# Patient Record
Sex: Male | Born: 1955 | Race: Black or African American | Hispanic: No | Smoking: Current every day smoker
Health system: Southern US, Community
[De-identification: ages and names within clinical notes are randomized; demographics above are authoritative.]

## PROBLEM LIST (undated history)

## (undated) DIAGNOSIS — I10 Essential (primary) hypertension: Secondary | ICD-10-CM

## (undated) DIAGNOSIS — I219 Acute myocardial infarction, unspecified: Secondary | ICD-10-CM

## (undated) DIAGNOSIS — I739 Peripheral vascular disease, unspecified: Secondary | ICD-10-CM

## (undated) DIAGNOSIS — J449 Chronic obstructive pulmonary disease, unspecified: Secondary | ICD-10-CM

## (undated) DIAGNOSIS — K529 Noninfective gastroenteritis and colitis, unspecified: Secondary | ICD-10-CM

## (undated) DIAGNOSIS — I2510 Atherosclerotic heart disease of native coronary artery without angina pectoris: Secondary | ICD-10-CM

## (undated) DIAGNOSIS — E785 Hyperlipidemia, unspecified: Secondary | ICD-10-CM

## (undated) DIAGNOSIS — Z720 Tobacco use: Secondary | ICD-10-CM

## (undated) HISTORY — PX: HERNIA REPAIR: SHX51

## (undated) HISTORY — PX: MOUTH SURGERY: SHX715

## (undated) HISTORY — DX: Atherosclerotic heart disease of native coronary artery without angina pectoris: I25.10

---

## 2001-07-27 ENCOUNTER — Emergency Department (HOSPITAL_COMMUNITY): Admission: EM | Admit: 2001-07-27 | Discharge: 2001-07-27 | Payer: Self-pay | Admitting: *Deleted

## 2004-11-30 ENCOUNTER — Ambulatory Visit: Payer: Self-pay | Admitting: Orthopedic Surgery

## 2007-02-02 ENCOUNTER — Emergency Department (HOSPITAL_COMMUNITY): Admission: EM | Admit: 2007-02-02 | Discharge: 2007-02-02 | Payer: Self-pay | Admitting: Emergency Medicine

## 2009-08-27 ENCOUNTER — Observation Stay (HOSPITAL_COMMUNITY): Admission: EM | Admit: 2009-08-27 | Discharge: 2009-08-29 | Payer: Self-pay | Admitting: Emergency Medicine

## 2009-08-27 ENCOUNTER — Ambulatory Visit: Payer: Self-pay | Admitting: Gastroenterology

## 2009-08-29 ENCOUNTER — Telehealth: Payer: Self-pay | Admitting: Gastroenterology

## 2009-09-06 ENCOUNTER — Emergency Department (HOSPITAL_COMMUNITY): Admission: EM | Admit: 2009-09-06 | Discharge: 2009-09-06 | Payer: Self-pay | Admitting: Emergency Medicine

## 2009-09-29 ENCOUNTER — Encounter: Payer: Self-pay | Admitting: Gastroenterology

## 2009-11-04 ENCOUNTER — Ambulatory Visit: Payer: Self-pay | Admitting: Gastroenterology

## 2009-11-04 DIAGNOSIS — K5289 Other specified noninfective gastroenteritis and colitis: Secondary | ICD-10-CM

## 2009-11-18 ENCOUNTER — Ambulatory Visit: Payer: Self-pay | Admitting: Gastroenterology

## 2009-11-18 ENCOUNTER — Ambulatory Visit (HOSPITAL_COMMUNITY): Admission: RE | Admit: 2009-11-18 | Discharge: 2009-11-18 | Payer: Self-pay | Admitting: Gastroenterology

## 2010-03-14 NOTE — Letter (Signed)
Summary: TCS ORDER  TCS ORDER   Imported By: Ave Filter 11/04/2009 11:19:19  _____________________________________________________________________  External Attachment:    Type:   Image     Comment:   External Document

## 2010-03-14 NOTE — Assessment & Plan Note (Signed)
Summary: colitis/MM   Visit Type:  Follow-up Visit Primary Care Provider:  [facility]  Chief Complaint:  F/U colitis.  History of Present Illness:  Joshua Hall is a very pleasant 55 y/o AA male, who presents to schedule TCS. He was hospitalized in 7/11 with colitis. Treated with Cipro and Flagyl. He has never had TCS. Currently he is back at baseline. One formed BM daily. No melena, brbpr. No heartburn, n/v, dysphagia, weight loss.   Stool culture and C.Diff X 2 were negative. CT A/P 08/27/09-->mild apparent diffuse thickening of wall of colon from cecum to rectum, likely small right renal cysts. CT A/P 09/06/09-->right-sided colonic wall thickening.   Current Medications (verified): 1)  No Meds  Allergies (verified): 1)  ! * Bee Stings  Past History:  Past Medical History: Recent colitis in 7/11, treated as infectious colitis.   Past Surgical History: Unremarkable  Family History: No FH of CRC, colon polyp.  Social History: He has occasional beer and a glass of wine (once every three weeks.).  He smokes 1/2 pack every 3 days, has smoked for the last 30 years.  He raises Higher education careers adviser.  He also works with the [employer]  Review of Systems General:  Denies fever, chills, sweats, anorexia, fatigue, weakness, and weight loss. Eyes:  Denies vision loss. ENT:  Denies nasal congestion, sore throat, hoarseness, and difficulty swallowing. CV:  Denies chest pains, angina, palpitations, dyspnea on exertion, and peripheral edema. Resp:  Denies dyspnea at rest, dyspnea with exercise, cough, sputum, and wheezing. GI:  See HPI. GU:  Denies urinary burning and blood in urine. MS:  Denies joint pain / LOM. Derm:  Denies rash and itching. Neuro:  Denies weakness, frequent headaches, memory loss, and confusion. Psych:  Denies depression and anxiety. Endo:  Denies unusual weight change. Heme:  Denies bruising and bleeding. Allergy:  Denies hives and rash.  Vital  Signs:  Patient profile:   55 year old male Height:      72 inches Weight:      189 pounds BMI:     25.73 Temp:     97.9 degrees F oral Pulse rate:   80 / minute BP sitting:   144 / 82  (left arm)  Vitals Entered By: Cloria Spring LPN (November 04, 2009 10:50 AM)  Physical Exam  General:  Well developed, well nourished, no acute distress. Head:  Normocephalic and atraumatic. Eyes:  Conjunctivae pink, no scleral icterus.  Mouth:  Oropharyngeal mucosa moist, pink.  No lesions, erythema or exudate.    Neck:  Supple; no masses or thyromegaly. Lungs:  Clear throughout to auscultation. Heart:  Regular rate and rhythm; no murmurs, rubs,  or bruits. Abdomen:  Bowel sounds normal.  Abdomen is soft, nontender, nondistended.  No rebound or guarding.  No hepatosplenomegaly, masses or hernias.  No abdominal bruits.  Rectal:  deferred until time of colonoscopy.   Extremities:  No clubbing, cyanosis, edema or deformities noted. Neurologic:  Alert and  oriented x4;  grossly normal neurologically. Skin:  Intact without significant lesions or rashes. Cervical Nodes:  No significant cervical adenopathy. Psych:  Alert and cooperative. Normal mood and affect.  Impression & Recommendations:  Problem # 1:  COLITIS (ICD-558.9)  Recent hospitalization for colitis (likely infectious). Patient seen in ED and had follow-up CT few days after his D/C in 7/11 and colitis had improved. Currently patient is assymptomatic. Needs TCS. Colonoscopy to be performed in near future.  Risks, alternatives, and benefits including but  not limited to the risk of reaction to medication, bleeding, infection, and perforation were addressed.  Patient voiced understanding and provided verbal consent.   Orders: Est. Patient Level IV (21308)

## 2010-03-14 NOTE — Letter (Signed)
Summary: CONSULTATION  CONSULTATION   Imported By: Rexene Alberts 09/29/2009 10:40:27  _____________________________________________________________________  External Attachment:    Type:   Image     Comment:   External Document

## 2010-03-14 NOTE — Progress Notes (Signed)
Summary: COLITIS, NEEDS TCS  TCS IN 4 WEEKS, dX: COLITIS. TRILYTE PREP. [place] Bali MD  August 29, 2009 12:37 PM  Appended Document: COLITIS, NEEDS TCS Patient has appt in office to see extender prior to TCS  Appended Document: COLITIS, NEEDS TCS pt aware of appt for 8/22 @ 2:30pm w/KJ

## 2010-04-29 LAB — RAPID URINE DRUG SCREEN, HOSP PERFORMED
Amphetamines: NOT DETECTED
Amphetamines: NOT DETECTED
Barbiturates: NOT DETECTED
Benzodiazepines: NOT DETECTED
Cocaine: NOT DETECTED
Cocaine: NOT DETECTED
Opiates: POSITIVE — AB
Tetrahydrocannabinol: NOT DETECTED
Tetrahydrocannabinol: NOT DETECTED

## 2010-04-29 LAB — COMPREHENSIVE METABOLIC PANEL
AST: 17 U/L (ref 0–37)
Albumin: 3.1 g/dL — ABNORMAL LOW (ref 3.5–5.2)
Albumin: 3.7 g/dL (ref 3.5–5.2)
Alkaline Phosphatase: 64 U/L (ref 39–117)
Alkaline Phosphatase: 74 U/L (ref 39–117)
BUN: 11 mg/dL (ref 6–23)
BUN: 7 mg/dL (ref 6–23)
CO2: 28 mEq/L (ref 19–32)
Chloride: 106 mEq/L (ref 96–112)
Creatinine, Ser: 0.96 mg/dL (ref 0.4–1.5)
Creatinine, Ser: 0.99 mg/dL (ref 0.4–1.5)
GFR calc Af Amer: >60 mL/min (ref >60)
GFR calc non Af Amer: >60 mL/min (ref >60)
Glucose, Bld: 106 mg/dL — ABNORMAL HIGH (ref 70–99)
Glucose, Bld: 107 mg/dL — ABNORMAL HIGH (ref 70–99)
Sodium: 135 mEq/L (ref 135–145)
Total Bilirubin: 0.7 mg/dL (ref 0.3–1.2)

## 2010-04-29 LAB — BASIC METABOLIC PANEL
BUN: 10 mg/dL (ref 6–23)
BUN: 12 mg/dL (ref 6–23)
Calcium: 8.4 mg/dL (ref 8.4–10.5)
Chloride: 104 mEq/L (ref 96–112)
Creatinine, Ser: 0.86 mg/dL (ref 0.4–1.5)
GFR calc non Af Amer: >60 mL/min (ref >60)
Potassium: 3.8 mEq/L (ref 3.5–5.1)

## 2010-04-29 LAB — HEPATIC FUNCTION PANEL
ALT: 26 U/L (ref 0–53)
AST: 25 U/L (ref 0–37)
Albumin: 3.8 g/dL (ref 3.5–5.2)
Alkaline Phosphatase: 73 U/L (ref 39–117)
Bilirubin, Direct: 0.1 mg/dL (ref 0.0–0.3)
Total Bilirubin: 0.6 mg/dL (ref 0.3–1.2)

## 2010-04-29 LAB — URINALYSIS, ROUTINE W REFLEX MICROSCOPIC
Bilirubin Urine: NEGATIVE
Glucose, UA: NEGATIVE mg/dL
Ketones, ur: NEGATIVE mg/dL
Ketones, ur: NEGATIVE mg/dL
Leukocytes, UA: NEGATIVE
Nitrite: NEGATIVE
Nitrite: NEGATIVE
Protein, ur: NEGATIVE mg/dL
Protein, ur: NEGATIVE mg/dL
Specific Gravity, Urine: 1.025 (ref 1.005–1.030)
Urobilinogen, UA: 0.2 mg/dL (ref 0.0–1.0)
pH: 6 (ref 5.0–8.0)
pH: 6 (ref 5.0–8.0)

## 2010-04-29 LAB — MAGNESIUM: Magnesium: 1.7 mg/dL (ref 1.5–2.5)

## 2010-04-29 LAB — DIFFERENTIAL
Basophils Absolute: 0 10*3/uL (ref 0.0–0.1)
Basophils Absolute: 0 10*3/uL (ref 0.0–0.1)
Basophils Relative: 1 % (ref 0–1)
Eosinophils Absolute: 0.2 10*3/uL (ref 0.0–0.7)
Eosinophils Relative: 3 % (ref 0–5)
Eosinophils Relative: 6 % — ABNORMAL HIGH (ref 0–5)
Lymphocytes Relative: 22 % (ref 12–46)
Lymphocytes Relative: 41 % (ref 12–46)
Lymphs Abs: 1.1 10*3/uL (ref 0.7–4.0)
Monocytes Absolute: 0.4 10*3/uL (ref 0.1–1.0)
Monocytes Relative: 12 % (ref 3–12)
Monocytes Relative: 9 % (ref 3–12)
Neutro Abs: 2.2 10*3/uL (ref 1.7–7.7)
Neutro Abs: 3 10*3/uL (ref 1.7–7.7)
Neutrophils Relative %: 62 % (ref 43–77)

## 2010-04-29 LAB — CBC
HCT: 37.4 % — ABNORMAL LOW (ref 39.0–52.0)
Hemoglobin: 11.7 g/dL — ABNORMAL LOW (ref 13.0–17.0)
Hemoglobin: 13.1 g/dL (ref 13.0–17.0)
MCH: 31.8 pg (ref 26.0–34.0)
MCH: 31.9 pg (ref 26.0–34.0)
MCHC: 35.1 g/dL (ref 30.0–36.0)
MCV: 90.4 fL (ref 78.0–100.0)
MCV: 90.9 fL (ref 78.0–100.0)
MCV: 91.5 fL (ref 78.0–100.0)
Platelets: 267 10*3/uL (ref 150–400)
RBC: 3.67 MIL/uL — ABNORMAL LOW (ref 4.22–5.81)
RDW: 14 % (ref 11.5–15.5)
WBC: 4.8 10*3/uL (ref 4.0–10.5)
WBC: 5.1 10*3/uL (ref 4.0–10.5)
WBC: 5.2 10*3/uL (ref 4.0–10.5)

## 2010-04-29 LAB — LIPID PANEL
HDL: 42 mg/dL (ref >39)
VLDL: 16 mg/dL (ref 0–40)

## 2010-04-29 LAB — BRAIN NATRIURETIC PEPTIDE: Pro B Natriuretic peptide (BNP): <30 pg/mL (ref 0.0–100.0)

## 2010-04-29 LAB — CLOSTRIDIUM DIFFICILE EIA

## 2010-04-29 LAB — HEMOGLOBIN A1C
Hgb A1c MFr Bld: 5.7 % — ABNORMAL HIGH (ref <5.7)
Mean Plasma Glucose: 117 mg/dL — ABNORMAL HIGH (ref <117)

## 2010-04-29 LAB — LIPASE, BLOOD: Lipase: 24 U/L (ref 11–59)

## 2010-06-07 ENCOUNTER — Emergency Department (HOSPITAL_COMMUNITY)
Admission: EM | Admit: 2010-06-07 | Discharge: 2010-06-08 | Disposition: A | Discharge status: Home / Self Care | Payer: PRIVATE HEALTH INSURANCE | Attending: Emergency Medicine | Admitting: Emergency Medicine

## 2010-06-07 ENCOUNTER — Emergency Department (HOSPITAL_COMMUNITY): Payer: PRIVATE HEALTH INSURANCE

## 2010-06-07 DIAGNOSIS — R109 Unspecified abdominal pain: Secondary | ICD-10-CM | POA: Insufficient documentation

## 2010-06-07 DIAGNOSIS — R112 Nausea with vomiting, unspecified: Secondary | ICD-10-CM | POA: Insufficient documentation

## 2010-06-07 DIAGNOSIS — R197 Diarrhea, unspecified: Secondary | ICD-10-CM | POA: Insufficient documentation

## 2010-06-07 LAB — CBC
Hemoglobin: 13.8 g/dL (ref 13.0–17.0)
MCH: 31.2 pg (ref 26.0–34.0)
MCHC: 35.8 g/dL (ref 30.0–36.0)
MCV: 86.9 fL (ref 78.0–100.0)
Platelets: 272 10*3/uL (ref 150–400)

## 2010-06-07 LAB — DIFFERENTIAL
Basophils Relative: 0 % (ref 0–1)
Eosinophils Absolute: 0.1 10*3/uL (ref 0.0–0.7)
Lymphs Abs: 0.7 10*3/uL (ref 0.7–4.0)
Monocytes Absolute: 0.5 10*3/uL (ref 0.1–1.0)
Monocytes Relative: 8 % (ref 3–12)

## 2010-06-08 LAB — BASIC METABOLIC PANEL
BUN: 13 mg/dL (ref 6–23)
CO2: 25 mEq/L (ref 19–32)
Calcium: 9.7 mg/dL (ref 8.4–10.5)
Creatinine, Ser: 1.07 mg/dL (ref 0.4–1.5)
GFR calc Af Amer: >60 mL/min (ref >60)

## 2010-07-26 ENCOUNTER — Emergency Department (HOSPITAL_COMMUNITY)
Admission: EM | Admit: 2010-07-26 | Discharge: 2010-07-26 | Disposition: A | Discharge status: Home / Self Care | Payer: PRIVATE HEALTH INSURANCE | Attending: Emergency Medicine | Admitting: Emergency Medicine

## 2010-07-26 DIAGNOSIS — L02619 Cutaneous abscess of unspecified foot: Secondary | ICD-10-CM | POA: Insufficient documentation

## 2010-07-26 DIAGNOSIS — F17200 Nicotine dependence, unspecified, uncomplicated: Secondary | ICD-10-CM | POA: Insufficient documentation

## 2010-07-26 DIAGNOSIS — L03119 Cellulitis of unspecified part of limb: Secondary | ICD-10-CM | POA: Insufficient documentation

## 2010-07-26 DIAGNOSIS — M7989 Other specified soft tissue disorders: Secondary | ICD-10-CM | POA: Insufficient documentation

## 2010-07-27 ENCOUNTER — Other Ambulatory Visit (HOSPITAL_COMMUNITY): Payer: Self-pay | Admitting: Internal Medicine

## 2010-07-27 ENCOUNTER — Ambulatory Visit (HOSPITAL_COMMUNITY)
Admission: RE | Admit: 2010-07-27 | Discharge: 2010-07-27 | Disposition: A | Discharge status: Home / Self Care | Payer: PRIVATE HEALTH INSURANCE | Source: Ambulatory Visit | Attending: Internal Medicine | Admitting: Internal Medicine

## 2010-07-27 DIAGNOSIS — M25579 Pain in unspecified ankle and joints of unspecified foot: Secondary | ICD-10-CM

## 2011-10-14 HISTORY — PX: CARDIAC CATHETERIZATION: SHX172

## 2011-10-18 ENCOUNTER — Encounter (HOSPITAL_COMMUNITY): Payer: Self-pay

## 2011-10-18 ENCOUNTER — Inpatient Hospital Stay (HOSPITAL_COMMUNITY)
Admission: EM | Admit: 2011-10-18 | Discharge: 2011-10-20 | DRG: 287 | Disposition: A | Discharge status: Home / Self Care | Payer: PRIVATE HEALTH INSURANCE | Attending: Cardiology | Admitting: Cardiology

## 2011-10-18 ENCOUNTER — Emergency Department (HOSPITAL_COMMUNITY): Payer: PRIVATE HEALTH INSURANCE

## 2011-10-18 DIAGNOSIS — E785 Hyperlipidemia, unspecified: Secondary | ICD-10-CM | POA: Diagnosis present

## 2011-10-18 DIAGNOSIS — F17200 Nicotine dependence, unspecified, uncomplicated: Secondary | ICD-10-CM | POA: Diagnosis present

## 2011-10-18 DIAGNOSIS — R079 Chest pain, unspecified: Secondary | ICD-10-CM

## 2011-10-18 DIAGNOSIS — I2510 Atherosclerotic heart disease of native coronary artery without angina pectoris: Principal | ICD-10-CM | POA: Diagnosis present

## 2011-10-18 DIAGNOSIS — I498 Other specified cardiac arrhythmias: Secondary | ICD-10-CM | POA: Diagnosis present

## 2011-10-18 DIAGNOSIS — I200 Unstable angina: Secondary | ICD-10-CM | POA: Diagnosis present

## 2011-10-18 DIAGNOSIS — E782 Mixed hyperlipidemia: Secondary | ICD-10-CM | POA: Diagnosis present

## 2011-10-18 DIAGNOSIS — Z8249 Family history of ischemic heart disease and other diseases of the circulatory system: Secondary | ICD-10-CM

## 2011-10-18 HISTORY — DX: Noninfective gastroenteritis and colitis, unspecified: K52.9

## 2011-10-18 LAB — CBC WITH DIFFERENTIAL/PLATELET
Basophils Absolute: 0 10*3/uL (ref 0.0–0.1)
Basophils Relative: 0 % (ref 0–1)
Eosinophils Absolute: 0.4 10*3/uL (ref 0.0–0.7)
Eosinophils Relative: 5 % (ref 0–5)
Lymphs Abs: 3 10*3/uL (ref 0.7–4.0)
MCH: 31 pg (ref 26.0–34.0)
MCV: 85.4 fL (ref 78.0–100.0)
Neutrophils Relative %: 42 % — ABNORMAL LOW (ref 43–77)
Platelets: 259 10*3/uL (ref 150–400)
RBC: 4.58 MIL/uL (ref 4.22–5.81)
RDW: 13.9 % (ref 11.5–15.5)

## 2011-10-18 LAB — HEPARIN LEVEL (UNFRACTIONATED)
Heparin Unfractionated: 0.25 IU/mL — ABNORMAL LOW (ref 0.30–0.70)
Heparin Unfractionated: 0.32 IU/mL (ref 0.30–0.70)

## 2011-10-18 LAB — TROPONIN I
Troponin I: <0.3 ng/mL (ref <0.30)
Troponin I: <0.3 ng/mL (ref <0.30)

## 2011-10-18 LAB — TSH: TSH: 0.698 u[IU]/mL (ref 0.350–4.500)

## 2011-10-18 LAB — BASIC METABOLIC PANEL
Calcium: 9.4 mg/dL (ref 8.4–10.5)
GFR calc Af Amer: 79 mL/min — ABNORMAL LOW (ref >90)
GFR calc non Af Amer: 68 mL/min — ABNORMAL LOW (ref >90)
Glucose, Bld: 116 mg/dL — ABNORMAL HIGH (ref 70–99)
Potassium: 3.5 mEq/L (ref 3.5–5.1)
Sodium: 138 mEq/L (ref 135–145)

## 2011-10-18 LAB — CK TOTAL AND CKMB (NOT AT ARMC)
CK, MB: 2.5 ng/mL (ref 0.3–4.0)
Total CK: 171 U/L (ref 7–232)

## 2011-10-18 MED ORDER — ZOLPIDEM TARTRATE 5 MG PO TABS: 5.0000 mg | ORAL_TABLET | Freq: Every evening | ORAL | Status: DC | PRN

## 2011-10-18 MED ORDER — ONDANSETRON HCL 4 MG/2ML IJ SOLN: 4.0000 mg | Freq: Four times a day (QID) | INTRAMUSCULAR | Status: DC | PRN

## 2011-10-18 MED ORDER — SODIUM CHLORIDE 0.9 % IV SOLN
1.0000 mL/kg/h | INTRAVENOUS | Status: DC
  Administered 2011-10-19: 1 mL/kg/h via INTRAVENOUS

## 2011-10-18 MED ORDER — DIAZEPAM 5 MG PO TABS
5.0000 mg | ORAL_TABLET | ORAL | Status: AC
  Administered 2011-10-19: 5 mg via ORAL
  Filled 2011-10-18: qty 1

## 2011-10-18 MED ORDER — ASPIRIN EC 81 MG PO TBEC
81.0000 mg | DELAYED_RELEASE_TABLET | Freq: Every day | ORAL | Status: DC
  Administered 2011-10-19 – 2011-10-20 (×2): 81 mg via ORAL
  Filled 2011-10-18 (×2): qty 1

## 2011-10-18 MED ORDER — ASPIRIN 81 MG PO CHEW
324.0000 mg | CHEWABLE_TABLET | Freq: Once | ORAL | Status: AC
  Administered 2011-10-18: 324 mg via ORAL

## 2011-10-18 MED ORDER — ATORVASTATIN CALCIUM 80 MG PO TABS
80.0000 mg | ORAL_TABLET | Freq: Every day | ORAL | Status: DC
  Administered 2011-10-18 – 2011-10-19 (×2): 80 mg via ORAL
  Filled 2011-10-18 (×4): qty 1

## 2011-10-18 MED ORDER — ASPIRIN 81 MG PO CHEW
CHEWABLE_TABLET | ORAL | Status: AC
  Filled 2011-10-18: qty 4

## 2011-10-18 MED ORDER — HEPARIN (PORCINE) IN NACL 100-0.45 UNIT/ML-% IJ SOLN
1400.0000 [IU]/h | INTRAMUSCULAR | Status: DC
  Administered 2011-10-18: 1400 [IU]/h via INTRAVENOUS
  Filled 2011-10-18 (×4): qty 250

## 2011-10-18 MED ORDER — ACETAMINOPHEN 325 MG PO TABS
650.0000 mg | ORAL_TABLET | ORAL | Status: DC | PRN
  Administered 2011-10-18 – 2011-10-19 (×2): 650 mg via ORAL
  Filled 2011-10-18 (×2): qty 2

## 2011-10-18 MED ORDER — ASPIRIN 81 MG PO CHEW
324.0000 mg | CHEWABLE_TABLET | ORAL | Status: AC
  Administered 2011-10-19: 324 mg via ORAL
  Filled 2011-10-18: qty 4

## 2011-10-18 MED ORDER — SODIUM CHLORIDE 0.9 % IJ SOLN
3.0000 mL | Freq: Two times a day (BID) | INTRAMUSCULAR | Status: DC
  Administered 2011-10-18: 3 mL via INTRAVENOUS

## 2011-10-18 MED ORDER — NITROGLYCERIN 0.2 MG/HR TD PT24
0.2000 mg | MEDICATED_PATCH | Freq: Every day | TRANSDERMAL | Status: DC
  Administered 2011-10-18 – 2011-10-20 (×3): 0.2 mg via TRANSDERMAL
  Filled 2011-10-18 (×4): qty 1

## 2011-10-18 MED ORDER — SODIUM CHLORIDE 0.9 % IV SOLN
INTRAVENOUS | Status: AC
  Administered 2011-10-18: 08:00:00 via INTRAVENOUS

## 2011-10-18 MED ORDER — HEPARIN BOLUS VIA INFUSION
4000.0000 [IU] | Freq: Once | INTRAVENOUS | Status: AC
  Administered 2011-10-18: 4000 [IU] via INTRAVENOUS

## 2011-10-18 MED ORDER — NITROGLYCERIN 0.4 MG SL SUBL
0.4000 mg | SUBLINGUAL_TABLET | SUBLINGUAL | Status: DC | PRN
  Administered 2011-10-18 (×2): 0.4 mg via SUBLINGUAL
  Filled 2011-10-18: qty 25

## 2011-10-18 MED ORDER — SODIUM CHLORIDE 0.9 % IJ SOLN
3.0000 mL | INTRAMUSCULAR | Status: DC | PRN
  Administered 2011-10-19: 3 mL via INTRAVENOUS

## 2011-10-18 MED ORDER — HEPARIN (PORCINE) IN NACL 100-0.45 UNIT/ML-% IJ SOLN
1200.0000 [IU]/h | Freq: Once | INTRAMUSCULAR | Status: AC
  Administered 2011-10-18: 1200 [IU]/h via INTRAVENOUS
  Filled 2011-10-18: qty 250

## 2011-10-18 MED ORDER — METOPROLOL TARTRATE 25 MG PO TABS
25.0000 mg | ORAL_TABLET | Freq: Two times a day (BID) | ORAL | Status: DC
  Administered 2011-10-18 – 2011-10-20 (×4): 25 mg via ORAL
  Filled 2011-10-18 (×6): qty 1

## 2011-10-18 MED ORDER — SODIUM CHLORIDE 0.9 % IV SOLN: 250.0000 mL | INTRAVENOUS | Status: DC | PRN

## 2011-10-18 NOTE — ED Notes (Signed)
Pt left with [facility] 

## 2011-10-18 NOTE — ED Provider Notes (Addendum)
History     CSN: [id number]  Arrival date & time 10/18/11  0507   First MD Initiated Contact with Patient 10/18/11 [id number]      Chief Complaint  Patient presents with  . Chest Pain    (Consider location/radiation/quality/duration/timing/severity/associated sxs/prior treatment) Patient is a 56 y.o. male presenting with chest pain. The history is provided by the patient and the spouse.  Chest Pain The chest pain began less than 1 hour ago. Chest pain occurs constantly. The chest pain is unchanged. At its most intense, the pain is at 10/10. The quality of the pain is described as pressure-like and tightness. The pain radiates to the left arm and left shoulder. Pertinent negatives for primary symptoms include no fever, no syncope, no shortness of breath, no palpitations, no abdominal pain, no nausea, no vomiting and no dizziness.  Pertinent negatives for associated symptoms include no diaphoresis. He tried nothing for the symptoms. Risk factors include male gender and smoking/tobacco exposure.  His family medical history is significant for CAD in family.     History reviewed. No pertinent past medical history.  History reviewed. No pertinent past surgical history.  No family history on file.  History  Substance Use Topics  . Smoking status: Current Everyday Smoker  . Smokeless tobacco: Not on file  . Alcohol Use: No      Review of Systems  Constitutional: Negative for fever and diaphoresis.  HENT: Negative for neck pain.   Eyes: Negative for visual disturbance.  Respiratory: Positive for chest tightness. Negative for shortness of breath.   Cardiovascular: Positive for chest pain. Negative for palpitations and syncope.  Gastrointestinal: Negative for nausea, vomiting and abdominal pain.  Genitourinary: Negative for dysuria and hematuria.  Musculoskeletal: Negative for back pain.  Skin: Negative for rash.  Neurological: Negative for dizziness and headaches.  Hematological: Does  not bruise/bleed easily.    Allergies  Review of patient's allergies indicates no known allergies.  Home Medications  No current outpatient prescriptions on file.  BP 113/80  Pulse 70  Temp 97.6 F (36.4 C) (Oral)  Resp 20  SpO2 95%  Physical Exam  Nursing note and vitals reviewed. Constitutional: He is oriented to person, place, and time. He appears well-developed and well-nourished. No distress.  HENT:  Head: Normocephalic and atraumatic.  Mouth/Throat: Oropharynx is clear and moist.  Eyes: Conjunctivae are normal. Pupils are equal, round, and reactive to light.  Neck: Normal range of motion. Neck supple.  Cardiovascular: Normal rate, regular rhythm, normal heart sounds and intact distal pulses.   No murmur heard. Pulmonary/Chest: Effort normal and breath sounds normal. No respiratory distress. He has no wheezes. He has no rales. He exhibits no tenderness.  Abdominal: Soft. There is no tenderness.  Musculoskeletal: Normal range of motion. He exhibits no edema.  Neurological: He is alert and oriented to person, place, and time. No cranial nerve deficit. He exhibits normal muscle tone. Coordination normal.  Skin: Skin is warm. No rash noted.    ED Course  Procedures (including critical care time)  Labs Reviewed  CBC WITH DIFFERENTIAL - Abnormal; Notable for the following:    MCHC 36.3 (*)     Neutrophils Relative 42 (*)     All other components within normal limits  BASIC METABOLIC PANEL - Abnormal; Notable for the following:    Glucose, Bld 116 (*)     GFR calc non Af Amer 68 (*)     GFR calc Af Amer 79 (*)  All other components within normal limits  TROPONIN I  TROPONIN I   Dg Chest Portable 1 View  10/18/2011  *RADIOLOGY REPORT*  Clinical Data: Chest pain  PORTABLE CHEST - 1 VIEW  Comparison: Portable exam 0550 hours without priors for comparison.  Findings: Minimal enlargement of cardiac silhouette. Tortuous aorta. Pulmonary vascularity normal. No definite  infiltrate, pleural effusion or pneumothorax. No acute osseous findings.  IMPRESSION: No acute abnormalities.   Original Report Authenticated By: Lollie Marrow, [name]    Results for orders placed during the hospital encounter of 10/18/11  CBC WITH DIFFERENTIAL      Component Value Range   WBC 7.1  4.0 - 10.5 K/uL   RBC 4.58  4.22 - 5.81 MIL/uL   Hemoglobin 14.2  13.0 - 17.0 g/dL   HCT 40.9  81.1 - 91.4 %   MCV 85.4  78.0 - 100.0 fL   MCH 31.0  26.0 - 34.0 pg   MCHC 36.3 (*) 30.0 - 36.0 g/dL   RDW 78.2  95.6 - 21.3 %   Platelets 259  150 - 400 K/uL   Neutrophils Relative 42 (*) 43 - 77 %   Neutro Abs 3.0  1.7 - 7.7 K/uL   Lymphocytes Relative 42  12 - 46 %   Lymphs Abs 3.0  0.7 - 4.0 K/uL   Monocytes Relative 11  3 - 12 %   Monocytes Absolute 0.8  0.1 - 1.0 K/uL   Eosinophils Relative 5  0 - 5 %   Eosinophils Absolute 0.4  0.0 - 0.7 K/uL   Basophils Relative 0  0 - 1 %   Basophils Absolute 0.0  0.0 - 0.1 K/uL  BASIC METABOLIC PANEL      Component Value Range   Sodium 138  135 - 145 mEq/L   Potassium 3.5  3.5 - 5.1 mEq/L   Chloride 102  96 - 112 mEq/L   CO2 27  19 - 32 mEq/L   Glucose, Bld 116 (*) 70 - 99 mg/dL   BUN 14  6 - 23 mg/dL   Creatinine, Ser 0.86  0.50 - 1.35 mg/dL   Calcium 9.4  8.4 - 57.8 mg/dL   GFR calc non Af Amer 68 (*) >90 mL/min   GFR calc Af Amer 79 (*) >90 mL/min  TROPONIN I      Component Value Range   Troponin I <0.30  <0.30 ng/mL    Date: 10/18/2011  Rate: 65  Rhythm: normal sinus rhythm  QRS Axis: normal  Intervals: normal  ST/T Wave abnormalities: nonspecific ST/T changes  Conduction Disutrbances:none  Narrative Interpretation:   Old EKG Reviewed: none available    1. Chest pain     CRITICAL CARE Performed by: Shelda Jakes.   Total critical care time: 30 Critical care time was exclusive of separately billable procedures and treating other patients.  Critical care was necessary to treat or prevent imminent or life-threatening  deterioration.  Critical care was time spent personally by me on the following activities: development of treatment plan with patient and/or surrogate as well as nursing, discussions with consultants, evaluation of patient's response to treatment, examination of patient, obtaining history from patient or surrogate, ordering and performing treatments and interventions, ordering and review of laboratory studies, ordering and review of radiographic studies, pulse oximetry and re-evaluation of patient's condition.   MDM  Patient with acute onset of chest pain at 445 this morning. Pain resolved shortly after arrival with the aspirin 324 mg and  2 sublingual nitroglycerin. Patient has remained pain-free since then. Patient chest x-ray was negative troponin cardiac marker was negative electrolytes without significant abnormalities. Patient has risk factors for his age being a male and a smoker also has a strong family history a brother with a significant heart event in his late 75s. Initially discussed with the hospitalist team they're willing to admit that the change her mind I contacted cardiology unassigned at [place] [facility] and Dr. Leona Singleton is accepted the patient to telemetry, heparin started. Patient is still pain-free and will be transported via [facility].         Shelda Jakes, MD 10/18/11 1610  Shelda Jakes, MD 10/20/11 1054

## 2011-10-18 NOTE — Progress Notes (Signed)
ANTICOAGULATION CONSULT NOTE - Follow Up Consult  Pharmacy Consult for heparin Indication: chest pain/ACS  No Known Allergies  Patient Measurements: Height: 6' 0.5" (184.2 cm) Weight: 201 lb (91.173 kg) IBW/kg (Calculated) : 78.75  Heparin Dosing Weight: 91kg  Vital Signs: Temp: 98 F (36.7 C) (09/05 2100) Temp src: Oral (09/05 1108) BP: 130/80 mmHg (09/05 2100) Pulse Rate: 51  (09/05 2100)  Labs:  Basename 10/18/11 2127 10/18/11 2033 10/18/11 1529 10/18/11 1402 10/18/11 0731 10/18/11 0527  HGB -- -- -- -- -- 14.2  HCT -- -- -- -- -- 39.1  PLT -- -- -- -- -- 259  APTT -- -- -- -- -- --  LABPROT -- -- -- -- -- --  INR -- -- -- -- -- --  HEPARINUNFRC 0.32 -- 0.25* -- -- --  CREATININE -- -- -- -- -- 1.18  CKTOTAL -- 164 -- 171 -- --  CKMB -- 2.5 -- 2.5 -- --  TROPONINI -- <0.30 -- <0.30 <0.30 --    Estimated Creatinine Clearance: 79.8 ml/min (by C-G formula based on Cr of 1.18).   Medications:  See med rec  Assessment: Patient is a 56 y.o M transferred to [facility] from [facility] for further workup of CP. Heparin 4000 units IV bolus and drip at 1200 units/hr started at [facility] at 8 AM today. Heparin level (0.32) is at-goal on 1400 units/hr.   Goal of Therapy:  Heparin level 0.3-0.7 units/ml Monitor platelets by anticoagulation protocol: Yes   Plan:  1. Continue IV heparin at 1400 units/hr. 2. Follow-up AM heparin level, CBC.  Emeline Gins 10/18/2011,10:36 PM

## 2011-10-18 NOTE — ED Notes (Signed)
Pt waiting to be transferred.

## 2011-10-18 NOTE — ED Notes (Signed)
[facility] here to transport pt 

## 2011-10-18 NOTE — ED Notes (Signed)
Breakfast tray given to pt 

## 2011-10-18 NOTE — ED Notes (Signed)
Dr. Deretha Emory in and discussed transfer to [facility] with pt.

## 2011-10-18 NOTE — Consult Note (Signed)
[facility] Medical Consultation  Joshua Hall OZH:[id number] DOB: November 09, 1955 DOA: 10/18/2011 PCP: Dwana Melena, MD   Requesting physician: Dr. Dutch Quint. Date of consultation: 10/18/2011. Reason for consultation: Chest tightness.  Impression/Recommendations    1. Unstable angina. 2. Tobacco abuse. 3. Strong family history of early coronary artery disease. Patient's brother died at the age of 95 with a myocardial infarction. I spoke with the ER physician, Dr. Jodelle Gross and have strongly recommended that this patient be transferred to [facility] for urgent consultation with cardiology as I think he should have cardiac catheterization for his unstable angina. He should be on intravenous heparin and nitrates.   Chief Complaint: Chest tightness.  HPI:  This 56 year old man, smoker, strong family history of early coronary artery disease, presents for the above symptom. He woke up at 4.45 this morning with onset of chest tightness radiating to the left arm. Approximately by 5 AM he was in the emergency room and 2 nitroglycerin relieved his discomfort in pain. He has never had this chest tightness before. He has no history of coronary artery disease. Initial ECG is unremarkable as is troponin.  Review of Systems:  Apart from history of present illness, other systems negative.    Social History: He is single, has a girlfriend. He lives with his mother. He is a smoker as mentioned above. He does drink alcohol but only occasionally. He works for the city of [employer].  No Known Allergies Family history: His brother died at the age of 34 with a myocardial infarction.  Prior to Admission medications   Not on File   Physical Exam: Blood pressure 113/80, pulse 70, temperature 97.6 F (36.4 C), temperature source Oral, resp. rate 20, SpO2 95.00%. Filed Vitals:   10/18/11 0546 10/18/11 0600 10/18/11 0627 10/18/11 0700  BP: 129/85 133/86 127/87 113/80  Pulse:  60 70     Temp:      TempSrc:      Resp:  17 20 20   SpO2:  100% 95%      General:  He looks systemically well. He does not appear to be any acute pain at the present time.  Eyes: No jaundice. No pallor.  ENT: Within normal limits.  Neck: No lymphadenopathy.  Cardiovascular: Heart sounds are present without any murmurs. There is no gallop rhythm. Jugular venous pressure not raised.  Respiratory: Lung fields are clear.  Abdomen: Soft, nontender, no masses. There is no hepatosplenomegaly.  Skin: No rashes.  Musculoskeletal: No joint problems.  Psychiatric: Appropriate affect.  Neurologic: Alert and orientated without any focal neurological signs.  Labs on Admission:  Basic Metabolic Panel:  Lab 10/18/11 6295  NA 138  K 3.5  CL 102  CO2 27  GLUCOSE 116*  BUN 14  CREATININE 1.18  CALCIUM 9.4  MG --  PHOS --       CBC:  Lab 10/18/11 0527  WBC 7.1  NEUTROABS 3.0  HGB 14.2  HCT 39.1  MCV 85.4  PLT 259   Cardiac Enzymes:  Lab 10/18/11 0527  CKTOTAL --  CKMB --  CKMBINDEX --  TROPONINI <0.30      Radiological Exams on Admission: Dg Chest Portable 1 View  10/18/2011  *RADIOLOGY REPORT*  Clinical Data: Chest pain  PORTABLE CHEST - 1 VIEW  Comparison: Portable exam 0550 hours without priors for comparison.  Findings: Minimal enlargement of cardiac silhouette. Tortuous aorta. Pulmonary vascularity normal. No definite infiltrate, pleural effusion or pneumothorax. No acute osseous findings.  IMPRESSION: No acute abnormalities.  Original Report Authenticated By: Lollie Marrow, [name]     EKG: Independently reviewed. Normal sinus rhythm no obvious acute ST-T wave changes.  Time spent: 30 minutes.  Wilson Singer [facility] Pager [phone].  If 7PM-7AM, please contact night-coverage [email or web address] Password [email or web address] 10/18/2011, 7:31 AM

## 2011-10-18 NOTE — ED Notes (Signed)
Pt states pain is now doen to 2/10 and that the numbness and tingling to left arm has mostly resolved.

## 2011-10-18 NOTE — ED Notes (Signed)
[facility] given to [facility]

## 2011-10-18 NOTE — H&P (Signed)
History and Physical  Patient ID: Joshua Hall MRN: [id number], SOB: 08-14-1955 56 y.o. Date of Encounter: 10/18/2011, 1:34 PM  Primary Physician: Dwana Melena, MD Primary Cardiologist: None  Chief Complaint: Chest pain  HPI: 56 y.o. male w/ PMHx significant for tobacco abuse and no prior cardiac history who transferred from [facility] to [facility] on 10/18/2011 with complaints of chest pain.  Patient has no prior cardiac history. Receives regular medical care. Denies HTN, HLD, or DM. Family history significant for a brother who died of MI at age 4. 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 He is normally able to do farm work, including a lot of walking up hills without chest pain or sob. One day last week he noticed he was more sob when walking up his hill back to the house and had to rest. This morning he woke up around 3:30 to go to the bathroom. He smoked a cigarette and while walking back to bed had sudden onset substernal chest pressure with radiation to his left arm. He thought it was indigestion so he drank pepsi with baking soda, which normally relieves his indigestion, but his pain continued and developed numbness in his left arm prompting him to present to the [facility] ED. The pain lasted about 30 minutes and was relieved with 2SL NTG in the ED. He had mild associated sob and diaphoresis. No nausea. Not changed with movement or inspiration. He traveled 6hrs to and from [place] about 3wks ago, but stopped often. No recent surgery. No history of cancer or bleeding/clotting disorders. Denies calf pain or swelling, but did endorse bilat calf spasms/cramps on Saturday while in bed. Denies recent illness, fever, chills, cough, night sweats, weight change, orthopnea, edema, palpitations, syncope, abd pain, change in bladder or bowels, melena/hematochezia/hematuria.   EKG shows NSR with no acute ST/T changes. CXR is  without acute cardiopulmonary abnormalities. Labs are significant for normal troponin x2 and unremarkable CBC/BMET. The chest pain was relieved with ASA and NTG in the ED. His symptoms were concerning for unstable angina for which he was placed on a heparin drip, NTG patch, BB and transferred to [facility] for further evaluation and treatment. He has arrived to [facility] in stable condition and pain free. Not tachycardic, tachypneic or hypoxic.   Past Medical History  Diagnosis Date  . Tobacco abuse   . Colitis     Surgical History:  Past Surgical History  Procedure Date  . None     Home Meds: None  Allergies: No Known Allergies  History   Social History  . Marital Status: Divorced    Spouse Name: N/A    Number of Children: N/A  . Years of Education: N/A   Occupational History  . Not on file.   Social History Main Topics  . Smoking status: Current Everyday Smoker -- 0.7 packs/day  . Smokeless tobacco: Not on file  . Alcohol Use: Yes     occasional  . Drug Use: No  . Sexually Active: Not on file   Other Topics Concern  . Not on file   Social History Narrative  . No narrative on file     Family History  Problem Relation Age of Onset  . Heart disease Brother     died of MI at 43  . Diabetes Mother     Review of Systems: General: negative for chills, fever,  night sweats or weight changes.  Cardiovascular: As per HPI Dermatological: negative for rash Respiratory: negative for cough or wheezing Urologic: negative for hematuria Abdominal: negative for nausea, vomiting, diarrhea, bright red blood per rectum, melena, or hematemesis Neurologic: negative for visual changes, syncope, or dizziness All other systems reviewed and are otherwise negative except as noted above.  Labs:   Component Value Date   WBC 7.1 10/18/2011   HGB 14.2 10/18/2011   HCT 39.1 10/18/2011   MCV 85.4 10/18/2011   PLT 259 10/18/2011    Lab 10/18/11 0527  NA 138  K 3.5  CL  102  CO2 27  BUN 14  CREATININE 1.18  CALCIUM 9.4  GLUCOSE 116*   Basename 10/18/11 0731 10/18/11 0527  TROPONINI <0.30 <0.30    Radiology/Studies:   10/18/2011 - Chest Portable 1 View Findings: Minimal enlargement of cardiac silhouette. Tortuous aorta. Pulmonary vascularity normal. No definite infiltrate, pleural effusion or pneumothorax. No acute osseous findings.  IMPRESSION: No acute abnormalities.       EKG: 10/18/11 @ 0507 - NSR 65bpm, no acute ST/T changes  Physical Exam: Blood pressure 130/88, pulse 54, temperature 98 F (36.7 C), temperature source Oral, resp. rate 18, height 6' 0.5" (1.842 m), weight 201 lb (91.173 kg), SpO2 98.00%. General: Well developed, well nourished, middle-aged in no acute distress. Head: Normocephalic, atraumatic, sclera non-icteric, nares are without discharge Neck: Supple. Negative for carotid bruits. JVD not elevated. Lungs: Clear bilaterally to auscultation without wheezes, rales, or rhonchi. Breathing is unlabored. Heart: RRR with S1 S2. No murmurs, rubs, or gallops appreciated. Abdomen: Soft, non-tender, non-distended with normoactive bowel sounds. No rebound/guarding. No obvious abdominal masses. Msk:  Strength and tone appear normal for age. Extremities: No edema. No clubbing or cyanosis. Distal pedal pulses are 2+ and equal bilaterally. Neuro: Alert and oriented X 3. Moves all extremities spontaneously. Psych:  Responds to questions appropriately with a normal affect.    ASSESSMENT AND PLAN:  56 y.o. male w/ PMHx significant for tobacco abuse and no prior cardiac history who transferred from [facility] to [facility] on 10/18/2011 with complaints of chest pain.  1. Chest pain: Patient has no prior cardiac history, but has risk factors of male gender, tobacco abuse, and family h/o early CAD. Presents with one episode of typical chest pain that occurred at ~4am this morning and was relieved after ~40mins with 2 SL NTG in the  ED. Troponin normal x2. EKG without acute ischemic changes. Not tachycardic, tachypneic or hypoxic. Low suspicion for PE. Will need further ischemic evaluation with cardiac cath. Cont IV heparin. Cont BB and monitor heart rate. Place on ASA and statin. Check lipid panel and A1C for risk stratification. Cont to cycle enzymes.  2. Tobacco Abuse: Discussed importance of smoking cessation. Expresses desire to quit.  Signed, HOPE, JESSICA PA-C 10/18/2011, 1:34 PM  History and all data above reviewed.  Patient examined.  I agree with the findings as above.  The patient has new onset resting chest pain that is worrisome for unstable angina in a patient with a family history of early CAD and tobacco abuse. Currently pain free.  Other findings as above. The patient exam reveals COR:RRR  ,  Lungs: Clear  ,  Abd: Positive bowel sounds, no rebound no guarding, Ext No edema  .  All available labs, radiology testing, previous records reviewed. Agree with documented assessment and plan. Very high pretest probability of obstructive CAD.  He needs a cardiac cath.  The  patient understands that risks included but are not limited to stroke (1 in 1000), death (1 in 1000), kidney failure [usually temporary] (1 in 500), bleeding (1 in 200), allergic reaction [possibly serious] (1 in 200).  The patient understands and agrees to proceed.   He will remain on meds as above pending this procedure tomorrow. Sooner if he has further symptoms.   Fayrene Fearing Arnell Mausolf  1:48 PM  10/18/2011

## 2011-10-18 NOTE — ED Notes (Signed)
Pt states pain down to 4/10 at this time after first nitro tab, States he has a slight headache and does not want to take a second tablet at this time.

## 2011-10-18 NOTE — Progress Notes (Addendum)
ANTICOAGULATION CONSULT NOTE - Follow Up Consult  Pharmacy Consult for heparin Indication: chest pain/ACS  No Known Allergies  Patient Measurements: Height: 6' 0.5" (184.2 cm) Weight: 201 lb (91.173 kg) IBW/kg (Calculated) : 78.75  Heparin Dosing Weight: 91kg  Vital Signs: Temp: 98 F (36.7 C) (09/05 1108) Temp src: Oral (09/05 1108) BP: 130/88 mmHg (09/05 1108) Pulse Rate: 54  (09/05 1108)  Labs:  Basename 10/18/11 0731 10/18/11 0527  HGB -- 14.2  HCT -- 39.1  PLT -- 259  APTT -- --  LABPROT -- --  INR -- --  HEPARINUNFRC -- --  CREATININE -- 1.18  CKTOTAL -- --  CKMB -- --  TROPONINI <0.30 <0.30    Estimated Creatinine Clearance: 79.8 ml/min (by C-G formula based on Cr of 1.18).   Medications:  See med rec  Assessment: Patient is a 56 y.o M transferred to [facility] from [facility] for further workup of CP. Heparin 4000 units IV bolus and drip at 1200 units/hr started at [facility] at 8 AM today.   Goal of Therapy:  Heparin level 0.3-0.7 units/ml Monitor platelets by anticoagulation protocol: Yes   Plan:  1) check heparin level at 3 PM today 2) continue current heparin regimen for now   Nuchem Grattan P 10/18/2011,2:21 PM  Adden (4PM):  Heparin level now back slightly below goal at 0.25.  Will increase heparin rate up to 1400 units/hr and recheck another 6 hour level.

## 2011-10-18 NOTE — ED Notes (Signed)
Pt awoke with left chest pressure/tightness, with radiation and numbness to left arm.  Pt denies other sympotms

## 2011-10-18 NOTE — ED Notes (Signed)
Pt states relief of chest pain after second nitro.

## 2011-10-19 ENCOUNTER — Ambulatory Visit (HOSPITAL_COMMUNITY): Admit: 2011-10-19 | Payer: Self-pay | Admitting: Cardiology

## 2011-10-19 ENCOUNTER — Encounter (HOSPITAL_COMMUNITY)
Admission: EM | Disposition: A | Discharge status: Home / Self Care | Payer: Self-pay | Source: Home / Self Care | Attending: Cardiology

## 2011-10-19 ENCOUNTER — Encounter (HOSPITAL_COMMUNITY): Payer: Self-pay | Admitting: General Practice

## 2011-10-19 DIAGNOSIS — I2510 Atherosclerotic heart disease of native coronary artery without angina pectoris: Secondary | ICD-10-CM

## 2011-10-19 HISTORY — PX: LEFT HEART CATHETERIZATION WITH CORONARY ANGIOGRAM: SHX5451

## 2011-10-19 LAB — COMPREHENSIVE METABOLIC PANEL
ALT: 16 U/L (ref 0–53)
Alkaline Phosphatase: 79 U/L (ref 39–117)
CO2: 25 mEq/L (ref 19–32)
GFR calc Af Amer: 89 mL/min — ABNORMAL LOW (ref >90)
GFR calc non Af Amer: 77 mL/min — ABNORMAL LOW (ref >90)
Glucose, Bld: 113 mg/dL — ABNORMAL HIGH (ref 70–99)
Potassium: 3.9 mEq/L (ref 3.5–5.1)
Sodium: 138 mEq/L (ref 135–145)

## 2011-10-19 LAB — CBC
Hemoglobin: 13.1 g/dL (ref 13.0–17.0)
RBC: 4.27 MIL/uL (ref 4.22–5.81)
WBC: 5.6 10*3/uL (ref 4.0–10.5)

## 2011-10-19 LAB — HEMOGLOBIN A1C: Hgb A1c MFr Bld: 5.8 % — ABNORMAL HIGH (ref <5.7)

## 2011-10-19 LAB — LIPID PANEL
HDL: 48 mg/dL (ref >39)
LDL Cholesterol: 118 mg/dL — ABNORMAL HIGH (ref 0–99)

## 2011-10-19 SURGERY — LEFT HEART CATHETERIZATION WITH CORONARY ANGIOGRAM
Anesthesia: LOCAL

## 2011-10-19 MED ORDER — NITROGLYCERIN 0.2 MG/ML ON CALL CATH LAB
INTRAVENOUS | Status: AC
  Filled 2011-10-19: qty 1

## 2011-10-19 MED ORDER — HEPARIN (PORCINE) IN NACL 2-0.9 UNIT/ML-% IJ SOLN
INTRAMUSCULAR | Status: AC
  Filled 2011-10-19: qty 2000

## 2011-10-19 MED ORDER — SODIUM CHLORIDE 0.9 % IV SOLN
1.0000 mL/kg/h | INTRAVENOUS | Status: AC
  Administered 2011-10-19: 1 mL/kg/h via INTRAVENOUS

## 2011-10-19 MED ORDER — ALUM & MAG HYDROXIDE-SIMETH 200-200-20 MG/5ML PO SUSP
30.0000 mL | ORAL | Status: DC | PRN
  Administered 2011-10-20: 30 mL via ORAL
  Filled 2011-10-19: qty 30

## 2011-10-19 MED ORDER — FENTANYL CITRATE 0.05 MG/ML IJ SOLN
INTRAMUSCULAR | Status: AC
  Filled 2011-10-19: qty 2

## 2011-10-19 MED ORDER — VERAPAMIL HCL 2.5 MG/ML IV SOLN
INTRAVENOUS | Status: AC
  Filled 2011-10-19: qty 2

## 2011-10-19 MED ORDER — MIDAZOLAM HCL 2 MG/2ML IJ SOLN
INTRAMUSCULAR | Status: AC
  Filled 2011-10-19: qty 2

## 2011-10-19 MED ORDER — LIDOCAINE HCL (PF) 1 % IJ SOLN
INTRAMUSCULAR | Status: AC
  Filled 2011-10-19: qty 30

## 2011-10-19 MED ORDER — HEPARIN SODIUM (PORCINE) 1000 UNIT/ML IJ SOLN
INTRAMUSCULAR | Status: AC
  Filled 2011-10-19: qty 1

## 2011-10-19 NOTE — Progress Notes (Signed)
Pt called w/ c/o "indigestion", points to epigastric area but states "left arm went numb".  Pt just finished snack and laid back flatter from reclined position.  BP 147/85.  EKG done and compared to old images.  SB 50's.  ST wave sloped in most leads but no different than previous EKGs.  Pain and numbness went away in <10 minutes without intervention.  Will continue to monitor closely, advised pt to call if pain or numbness returns.

## 2011-10-19 NOTE — Progress Notes (Signed)
UR Completed Sahory Nordling Graves-Bigelow, RN,BSN [phone]  

## 2011-10-19 NOTE — Interval H&P Note (Signed)
History and Physical Interval Note:  10/19/2011 3:13 PM  Joshua Hall  has presented today for surgery, with the diagnosis of Chest pain  The various methods of treatment have been discussed with the patient and family. After consideration of risks, benefits and other options for treatment, the patient has consented to  Procedure(s) (LRB) with comments: LEFT HEART CATHETERIZATION WITH CORONARY ANGIOGRAM (N/A) as a surgical intervention .  The patient's history has been reviewed, patient examined, no change in status, stable for surgery.  I have reviewed the patient's chart and labs.  Questions were answered to the patient's satisfaction.     Theron Arista [facility] 10/19/2011 3:13 PM

## 2011-10-19 NOTE — CV Procedure (Signed)
   Cardiac Catheterization Procedure Note  Name: Joshua Hall MRN: [id number] DOB: 06/13/1955  Procedure: Left Heart Cath, Selective Coronary Angiography, LV angiography  Indication: 56 year old black male presents with symptoms of unstable angina. He has a history of tobacco abuse.   Procedural Details: The right wrist was prepped, draped, and anesthetized with 1% lidocaine. Using the modified Seldinger technique, a 5 French sheath was introduced into the right radial artery. 3 mg of verapamil was administered through the sheath, weight-based unfractionated heparin was administered intravenously. Standard Judkins catheters were used for selective coronary angiography and left ventriculography. Catheter exchanges were performed over an exchange length guidewire. There were no immediate procedural complications. A TR band was used for radial hemostasis at the completion of the procedure.  The patient was transferred to the post catheterization recovery area for further monitoring.  Procedural Findings: Hemodynamics: AO 105/61 with a mean of 80 mmHg LV 106/14 mmHg  Coronary angiography: Coronary dominance: right  Left mainstem: Normal.  Left anterior descending (LAD): There is 30% stenosis in the proximal and mid LAD. The second diagonal is a very small branch. It is only 1 mm in diameter. It has a 99% stenosis proximally.  There is a large ramus intermediate branch which is normal.  Left circumflex (LCx): Normal.  Right coronary artery (RCA): Mild nonobstructive disease in the mid and distal vessel up to 10-20%.  Left ventriculography: Left ventricular systolic function is normal, LVEF is estimated at 55-65%, there is no significant mitral regurgitation   Final Conclusions:   1. Single vessel obstructive coronary disease involving a very tiny diagonal branch. Otherwise nonobstructive disease. The diagonal branch is too small for intervention.  2. Normal left ventricular  function.  Recommendations: Aggressive medical therapy and risk factor modification. Anticipate discharge in the morning if no further chest pain.  Theron Arista [facility] 10/19/2011, 3:45 PM

## 2011-10-19 NOTE — Care Management Note (Unsigned)
    Page 1 of 1   10/19/2011     2:34:50 PM   CARE MANAGEMENT NOTE 10/19/2011  Patient:  Joshua Hall, Joshua Hall   Account Number:  [id number]  Date Initiated:  10/19/2011  Documentation initiated by:  GRAVES-BIGELOW,Dreshawn Hendershott  Subjective/Objective Assessment:   Pt admitted with cp. Plan for cath.     Action/Plan:   CM will conitnue to monitor for disposition needs.   Anticipated DC Date:  10/19/2011   Anticipated DC Plan:  HOME/SELF CARE      DC Planning Services  CM consult      Choice offered to / List presented to:             Status of service:  In process, will continue to follow Medicare Important Message given?   (If response is "NO", the following Medicare IM given date fields will be blank) Date Medicare IM given:   Date Additional Medicare IM given:    Discharge Disposition:    Per UR Regulation:  Reviewed for med. necessity/level of care/duration of stay  If discussed at Long Length of Stay Meetings, dates discussed:    Comments:

## 2011-10-19 NOTE — Plan of Care (Signed)
Problem: Consults Goal: Tobacco Cessation referral if indicated Outcome: Completed/Met Date Met:  10/19/11 Discussed smoking cessation in length with pt.  States he smokes 3/4 ppd, last smoked 9/4, voices ready to quit, never has tried to quit before.  Discussed nicotine patch, gum, and medication, advised him to ask MD in am for recommendation.  Reminded pt danger of  smoking while using patch or gum.  Cessation handouts given with support line number.  Encouragement provided.

## 2011-10-19 NOTE — Progress Notes (Signed)
TR BAND REMOVAL  LOCATION:    right radial  DEFLATED PER PROTOCOL:    yes  TIME BAND OFF / DRESSING APPLIED:    1830   SITE UPON ARRIVAL:    Level 0  SITE AFTER BAND REMOVAL:    Level 0  REVERSE ALLEN'S TEST:     positive  CIRCULATION SENSATION AND MOVEMENT:    Within Normal Limits   yes  COMMENTS:   Tolerated procedure well 

## 2011-10-19 NOTE — Progress Notes (Signed)
ANTICOAGULATION CONSULT NOTE - Follow Up Consult  Pharmacy Consult for heparin Indication: chest pain/ACS  No Known Allergies  Patient Measurements: Height: 6' 0.5" (184.2 cm) Weight: 201 lb (91.173 kg) IBW/kg (Calculated) : 78.75    Vital Signs: Temp: 97.9 F (36.6 C) (09/06 0500) BP: 107/63 mmHg (09/06 0500) Pulse Rate: 55  (09/06 0500)  Labs:  Basename 10/19/11 0600 10/18/11 2127 10/18/11 2033 10/18/11 1529 10/18/11 1402 10/18/11 0731 10/18/11 0527  HGB 13.1 -- -- -- -- -- 14.2  HCT 36.3* -- -- -- -- -- 39.1  PLT 252 -- -- -- -- -- 259  APTT -- -- -- -- -- -- --  LABPROT 13.1 -- -- -- -- -- --  INR 0.97 -- -- -- -- -- --  HEPARINUNFRC 0.49 0.32 -- 0.25* -- -- --  CREATININE 1.07 -- -- -- -- -- 1.18  CKTOTAL -- -- 164 -- 171 -- --  CKMB -- -- 2.5 -- 2.5 -- --  TROPONINI -- -- <0.30 -- <0.30 <0.30 --    Estimated Creatinine Clearance: 88 ml/min (by C-G formula based on Cr of 1.07).  Assessment: Patient is a 56 y.o M on heparin for CP with plan for possible cardiac cath today.  Heparin level is at goal.  No bleeding noted.  Goal of Therapy:  Heparin level 0.3-0.7 units/ml Monitor platelets by anticoagulation protocol: Yes   Plan:  1) no change for heparin rate 2) f/u after cath    Mortimer Bair P 10/19/2011,10:38 AM

## 2011-10-20 DIAGNOSIS — E782 Mixed hyperlipidemia: Secondary | ICD-10-CM | POA: Diagnosis present

## 2011-10-20 LAB — CBC
Hemoglobin: 13.5 g/dL (ref 13.0–17.0)
RBC: 4.37 MIL/uL (ref 4.22–5.81)

## 2011-10-20 MED ORDER — SIMVASTATIN 40 MG PO TABS: 40.0000 mg | ORAL_TABLET | Freq: Every day | ORAL | Status: DC

## 2011-10-20 MED ORDER — NITROGLYCERIN 0.4 MG SL SUBL: 0.4000 mg | SUBLINGUAL_TABLET | SUBLINGUAL | Status: DC | PRN

## 2011-10-20 MED ORDER — METOPROLOL TARTRATE 25 MG PO TABS: 25.0000 mg | ORAL_TABLET | Freq: Two times a day (BID) | ORAL | Status: DC

## 2011-10-20 MED ORDER — NITROGLYCERIN 0.2 MG/HR TD PT24: 1.0000 | MEDICATED_PATCH | Freq: Every day | TRANSDERMAL | Status: DC

## 2011-10-20 MED ORDER — ASPIRIN 81 MG PO TABS: 81.0000 mg | ORAL_TABLET | Freq: Every day | ORAL | Status: AC

## 2011-10-20 NOTE — Discharge Summary (Signed)
CARDIOLOGY DISCHARGE SUMMARY   Patient ID: Joshua Hall MRN: [id number] DOB/AGE: 03/04/1955 56 y.o.  Admit date: 10/18/2011 Discharge date: 10/20/2011  Primary Discharge Diagnosis:  Anginal chest pain - medical therapy Secondary Discharge Diagnosis:  Past Medical History  Diagnosis Date  . Tobacco abuse     dyslipidemia, LDL goal less than 70   . Colitis    Procedures: Left Heart Cath, Selective Coronary Angiography, LV angiography  Hospital Course: Joshua Hall is a 56 year old male with no previous history of coronary artery disease. He had chest pain he came to the hospital where he was admitted for further evaluation and treatment.  His cardiac enzymes were negative for MI. His chest x-ray showed no acute disease. His symptoms were concerning for unstable angina. He was taken to the cath lab on 10/19/2011.  Full results are below but medical therapy was recommended for a small diagonal that was 99% block. He tolerated the procedure well. Smoking cessation was counseled and encouraged. Lipid profile showed an elevated LDL so he was started on a statin. He had a beta blocker added to his medication regimen as well as a nitroglycerin patch.  On 10/20/2011, Joshua Hall was seen by Dr. Ladona Ridgel. He was ambulating without chest pain or shortness of breath and considered stable for discharge, to follow up as an outpatient.  Labs:   Lab Results  Component Value Date   WBC 4.9 10/20/2011   HGB 13.5 10/20/2011   HCT 37.4* 10/20/2011   MCV 85.6 10/20/2011   PLT 257 10/20/2011    Lab 10/19/11 0600  NA 138  K 3.9  CL 104  CO2 25  BUN 12  CREATININE 1.07  CALCIUM 9.1  PROT 6.6  BILITOT 0.2*  ALKPHOS 79  ALT 16  AST 16  GLUCOSE 113*    Basename 10/18/11 2033 10/18/11 1402 10/18/11 0731  CKTOTAL 164 171 --  CKMB 2.5 2.5 --  CKMBINDEX -- -- --  TROPONINI <0.30 <0.30 <0.30   Lipid Panel     Component Value Date/Time   CHOL 191 10/19/2011 0600   TRIG 123 10/19/2011 0600   HDL 48  10/19/2011 0600   CHOLHDL 4.0 10/19/2011 0600   VLDL 25 10/19/2011 0600   LDLCALC 118* 10/19/2011 0600    Pro B Natriuretic peptide (BNP)  Date/Time Value Range Status  08/27/2009  7:22 AM <30.0  0.0 - 100.0 pg/mL Final    Basename 10/19/11 0600  INR 0.97      Radiology: Dg Chest Portable 1 View 10/18/2011  *RADIOLOGY REPORT*  Clinical Data: Chest pain  PORTABLE CHEST - 1 VIEW  Comparison: Portable exam 0550 hours without priors for comparison.  Findings: Minimal enlargement of cardiac silhouette. Tortuous aorta. Pulmonary vascularity normal. No definite infiltrate, pleural effusion or pneumothorax. No acute osseous findings.  IMPRESSION: No acute abnormalities.   Original Report Authenticated By: Lollie Marrow, [name]     Cardiac Cath: 10/19/2011 Left mainstem: Normal.  Left anterior descending (LAD): There is 30% stenosis in the proximal and mid LAD. The second diagonal is a very small branch. It is only 1 mm in diameter. It has a 99% stenosis proximally.  There is a large ramus intermediate branch which is normal.  Left circumflex (LCx): Normal.  Right coronary artery (RCA): Mild nonobstructive disease in the mid and distal vessel up to 10-20%.  Left ventriculography: Left ventricular systolic function is normal, LVEF is estimated at 55-65%, there is no significant mitral regurgitation  Final Conclusions:  1.  Single vessel obstructive coronary disease involving a very tiny diagonal branch. Otherwise nonobstructive disease. The diagonal branch is too small for intervention.  2. Normal left ventricular function.  Recommendations: Aggressive medical therapy and risk factor modification.  EKG: *20-Oct-2011 05:15:28  Sinus bradycardia Early repolarization Otherwise normal ECG 24mm/s 80mm/mV 100Hz  8.0.1 12SL 241 HD CID: 1 Referred by: JAMES HOCHREIN Unconfirmed Vent. rate 51 BPM PR interval 206 ms QRS duration 76 ms QT/QTc 430/396 ms P-R-T axes 60 68 71  FOLLOW UP PLANS AND  APPOINTMENTS No Known Allergies Medication List  As of 10/20/2011 10:19 AM   TAKE these medications         aspirin 81 MG tablet   Take 1 tablet (81 mg total) by mouth daily.      metoprolol tartrate 25 MG tablet   Commonly known as: LOPRESSOR   Take 1 tablet (25 mg total) by mouth 2 (two) times daily.      nitroGLYCERIN 0.2 mg/hr   Commonly known as: NITRODUR - Dosed in mg/24 hr   Place 1 patch (0.2 mg total) onto the skin daily.      nitroGLYCERIN 0.4 MG SL tablet   Commonly known as: NITROSTAT   Place 1 tablet (0.4 mg total) under the tongue every 5 (five) minutes as needed for chest pain.      simvastatin 40 MG tablet   Commonly known as: ZOCOR   Take 1 tablet (40 mg total) by mouth at bedtime.            BRING ALL MEDICATIONS WITH YOU TO FOLLOW UP APPOINTMENTS  Time spent with patient to include physician time: 31 min Signed: Theodore Demark 10/20/2011, 10:19 AM Co-Sign MD

## 2011-10-20 NOTE — Progress Notes (Signed)
Patient ID: Joshua Hall, male   DOB: 12-24-1955, 56 y.o.   MRN: [id number] Subjective:  No chest pain  Objective:  Vital Signs in the last 24 hours: Temp:  [97.7 F (36.5 C)-98.4 F (36.9 C)] 98.3 F (36.8 C) (09/07 0736) Pulse Rate:  [48-69] 58  (09/07 0736) Resp:  [12-24] 22  (09/07 0736) BP: (108-143)/(69-88) 137/69 mmHg (09/07 0736) SpO2:  [94 %-99 %] 96 % (09/07 0736) Weight:  [208 lb 15.9 oz (94.8 kg)] 208 lb 15.9 oz (94.8 kg) (09/07 0000)  Intake/Output from previous day: 09/06 0701 - 09/07 0700 In: 1170 [P.O.:600; I.V.:570] Out: 2025 [Urine:2025] Intake/Output from this shift:    Physical Exam: Well appearing NAD HEENT: Unremarkable Neck:  No JVD, no thyromegally Lungs:  Clear with no wheezes HEART:  Regular rate rhythm, no murmurs, no rubs, no clicks Abd:  Flat, positive bowel sounds, no organomegally, no rebound, no guarding Ext:  2 plus pulses, no edema, no cyanosis, no clubbing, right wrist without hematoma. Skin:  No rashes no nodules Neuro:  CN II through XII intact, motor grossly intact  Lab Results:  Basename 10/20/11 0626 10/19/11 0600  WBC 4.9 5.6  HGB 13.5 13.1  PLT 257 252    Basename 10/19/11 0600 10/18/11 0527  NA 138 138  K 3.9 3.5  CL 104 102  CO2 25 27  GLUCOSE 113* 116*  BUN 12 14  CREATININE 1.07 1.18    Basename 10/18/11 2033 10/18/11 1402  TROPONINI <0.30 <0.30   Hepatic Function Panel  Basename 10/19/11 0600  PROT 6.6  ALBUMIN 3.3*  AST 16  ALT 16  ALKPHOS 79  BILITOT 0.2*  BILIDIR --  IBILI --    Basename 10/19/11 0600  CHOL 191   No results found for this basename: PROTIME in the last 72 hours  Imaging: No results found.  Cardiac Studies: Tele - NSR Assessment/Plan:  1. CAD, s/p cath with small diagonal stenosis, medical therapy.  2. Dyslipidemia 3. Tobacco abuse Rec: ok for discharge. I discussed risk factor modification including stopping smoking. Usual followup.  LOS: 2 days    Buel Ream.D. 10/20/2011, 9:08 AM

## 2011-10-25 ENCOUNTER — Encounter: Payer: Self-pay | Admitting: Adult Health

## 2011-10-26 ENCOUNTER — Encounter: Payer: Self-pay | Admitting: Adult Health

## 2011-10-26 ENCOUNTER — Ambulatory Visit (INDEPENDENT_AMBULATORY_CARE_PROVIDER_SITE_OTHER): Payer: PRIVATE HEALTH INSURANCE | Admitting: Adult Health

## 2011-10-26 ENCOUNTER — Encounter: Payer: Self-pay | Admitting: *Deleted

## 2011-10-26 VITALS — BP 98/66 | HR 59 | Ht 72.0 in | Wt 199.0 lb

## 2011-10-26 DIAGNOSIS — I7090 Unspecified atherosclerosis: Secondary | ICD-10-CM

## 2011-10-26 DIAGNOSIS — E785 Hyperlipidemia, unspecified: Secondary | ICD-10-CM

## 2011-10-26 DIAGNOSIS — I2510 Atherosclerotic heart disease of native coronary artery without angina pectoris: Secondary | ICD-10-CM | POA: Insufficient documentation

## 2011-10-26 NOTE — Assessment & Plan Note (Signed)
Most recent labs completed during admission revealed a total cholesterol of 191, HDL of 48, triglycerides of 123, with an LDL of 118. The patient is currently on simvastatin 40 mg by mouth at bedtime. He will need followup lipids and LFTs in 3 months for ongoing assessment and risk reduction. He has been advised on low-cholesterol diet.

## 2011-10-26 NOTE — Progress Notes (Signed)
HPI: Joshua Hall is a 56 year old patient we are seeing on hospital followup after being seen by Dr. Oaks Lions in the setting of unstable angina, status post cardiac catheterization. The patient was found to have disease of his right coronary artery, LAD, and first diagonal, cath report dated 10/19/2011 revealed a 30% stenosis in the proximal and mid LAD, the second diagonal was a very small branch with only 1 mm in diameter. It had a 99% stenosis proximally. The circumflex was normal, the right coronary artery had mild nonobstructive disease in the mid and distal vessel up to 10-20%. His LV function was normal with an LVEF of 55-65%. There was no significant mitral regurgitation. The patient is to be treated medically and was placed on a nitroglycerin transdermal patch at 0.2 mg daily. He is also been placed on metoprolol 25 mg twice a day along with aspirin and Zocor.    The patient has no recurrent complaints of discomfort in his chest, dizziness, lightheadedness, or near syncope.[name] He remains busy and active but has not returned to work. 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 He is anxious to return to work.   No Known Allergies  Current Outpatient Prescriptions  Medication Sig Dispense Refill  . aspirin 81 MG tablet Take 1 tablet (81 mg total) by mouth daily.  30 tablet    . metoprolol tartrate (LOPRESSOR) 25 MG tablet Take 1 tablet (25 mg total) by mouth 2 (two) times daily.  60 tablet  11  . nitroGLYCERIN (NITRODUR - DOSED IN MG/24 HR) 0.2 mg/hr Place 1 patch (0.2 mg total) onto the skin daily.  30 patch  11  . nitroGLYCERIN (NITROSTAT) 0.4 MG SL tablet Place 1 tablet (0.4 mg total) under the tongue every 5 (five) minutes as needed for chest pain.  25 tablet  3  . simvastatin (ZOCOR) 40 MG tablet Take 1 tablet (40 mg total) by mouth at bedtime.  30 tablet  11    Past Medical History  Diagnosis Date  . Tobacco abuse   . Colitis     Past Surgical History  Procedure Date  .  None   . Mouth surgery     Family History  Problem Relation Age of Onset  . Heart disease Brother     died of MI at 26  . Diabetes Mother     History   Social History  . Marital Status: Divorced    Spouse Name: N/A    Number of Children: N/A  . Years of Education: N/A   Occupational History  . Not on file.   Social History Main Topics  . Smoking status: Former Smoker -- 0.7 packs/day for 40 years    Types: Cigarettes    Quit date: 10/17/2011  . Smokeless tobacco: Never Used  . Alcohol Use: Yes     occasional  . Drug Use: No  . Sexually Active: Yes   Other Topics Concern  . Not on file   Social History Narrative  . No narrative on file    VHQ:IONGEX of systems complete and found to be negative unless listed above  PHYSICAL EXAM BP 98/66  Pulse 59  Ht 6' (1.829 m)  Wt 199 lb (90.266 kg)  BMI 26.99 kg/m2  General: Well developed, well nourished, in no acute distress Head: Eyes PERRLA, No xanthomas.   Normal cephalic and atramatic  Lungs: Clear bilaterally to auscultation and percussion. Heart: HRRR S1 S2, without MRG.  Pulses are 2+ &  equal.            No carotid bruit. No JVD.  No abdominal bruits. No femoral bruits. Abdomen: Bowel sounds are positive, abdomen soft and non-tender without masses or   Hernia's noted. Msk:  Back normal, normal gait. Normal strength and tone for age. Right risk catheter insertion site is well-healed without evidence of bleeding or hematoma.  Extremities: No clubbing, cyanosis or edema.  DP +1 Neuro: Alert and oriented X 3. Psych:  Good affect, responds appropriately  EKG: Sinus bradycardia rate of 59 beats per minute, early repolarization is noted.   ASSESSMENT AND PLAN

## 2011-10-26 NOTE — Assessment & Plan Note (Signed)
The patient is without complaint of recurrent chest pain. He is tolerating nitroglycerin transdermal 0.2 mg daily without complaints of dizziness lightheadedness or headache. His blood pressure is soft and 98/66. I have retaken in the office manually, with a blood pressure of 110/78. Can consider decreasing transdermal nitroglycerin 0.1 mg daily if his blood pressure remains low and he is symptomatic with this. Currently this is not the case. He will followup in one month with Dr. Diona Browner. At that time discussion can be done concerning need to adjust medications at his discretion. The patient will be allowed to return to work with restrictions of heavy lifting, nothing greater than 20 pounds. 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

## 2011-10-26 NOTE — Patient Instructions (Signed)
Your physician recommends that you schedule a follow-up appointment in: 1 month with Dr McDowell.  Your physician recommends that you continue on your current medications as directed. Please refer to the Current Medication list given to you today.    

## 2011-11-28 ENCOUNTER — Encounter: Payer: Self-pay | Admitting: Cardiology

## 2011-11-28 ENCOUNTER — Ambulatory Visit (INDEPENDENT_AMBULATORY_CARE_PROVIDER_SITE_OTHER): Payer: PRIVATE HEALTH INSURANCE | Admitting: Cardiology

## 2011-11-28 VITALS — BP 140/70 | HR 71 | Ht 72.0 in | Wt 211.0 lb

## 2011-11-28 DIAGNOSIS — E782 Mixed hyperlipidemia: Secondary | ICD-10-CM

## 2011-11-28 DIAGNOSIS — I2510 Atherosclerotic heart disease of native coronary artery without angina pectoris: Secondary | ICD-10-CM

## 2011-11-28 NOTE — Assessment & Plan Note (Signed)
Continue statin therapy. We will likely arrange followup lab work around the time of his next visit.

## 2011-11-28 NOTE — Patient Instructions (Addendum)
Your physician recommends that you schedule a follow-up appointment in 3 MONTHS. 

## 2011-11-28 NOTE — Progress Notes (Signed)
   Clinical Summary [name] is a 56 y.o.male presenting for office followup. He was seen recently by Ms. Lawrence NP following hospitalization at [facility], and diagnosis of CAD with plan for medical therapy. This is my first meeting with him.  He states that he has been doing fairly well, now back at work full time. He reports occasional angina, has used sublingual nitroglycerin only once. Discussed his blood pressure, he states he does check it periodically at home.  Lab work in September revealed LDL 118, HDL 48.  No Known Allergies  Current Outpatient Prescriptions  Medication Sig Dispense Refill  . aspirin 81 MG tablet Take 1 tablet (81 mg total) by mouth daily.  30 tablet    . metoprolol tartrate (LOPRESSOR) 25 MG tablet Take 1 tablet (25 mg total) by mouth 2 (two) times daily.  60 tablet  11  . nitroGLYCERIN (NITRODUR - DOSED IN MG/24 HR) 0.2 mg/hr Place 1 patch (0.2 mg total) onto the skin daily.  30 patch  11  . nitroGLYCERIN (NITROSTAT) 0.4 MG SL tablet Place 1 tablet (0.4 mg total) under the tongue every 5 (five) minutes as needed for chest pain.  25 tablet  3  . simvastatin (ZOCOR) 40 MG tablet Take 1 tablet (40 mg total) by mouth at bedtime.  30 tablet  11    Past Medical History  Diagnosis Date  . Tobacco abuse   . Colitis   . Coronary atherosclerosis of native coronary artery     Largely nonobstructive in major epicardial was with 99% small diagonal stenosis - medical therapy    Social History [name] reports that he quit smoking about 6 weeks ago. His smoking use included Cigarettes. He has a 28 pack-year smoking history. He has never used smokeless tobacco. [name] reports that he drinks alcohol.  Review of Systems No palpitations, bleeding episodes, syncope. Stable appetite. Otherwise negative.  Physical Examination Filed Vitals:   11/28/11 1321  BP: 140/70  Pulse: 71   Filed Weights   11/28/11 1321  Weight: 211 lb (95.709 kg)   Patient no  acute distress. HEENT: Conjunctiva and lids normal, oropharynx clear with moist mucosa. Neck: Supple, no elevated JVP or carotid bruits, no thyromegaly. Lungs: Clear to auscultation, nonlabored breathing at rest. Cardiac: Regular rate and rhythm, no S3, 2/6 systolic murmur, no pericardial rub. Abdomen: Soft, nontender, bowel sounds present, no guarding or rebound. Extremities: No pitting edema, distal pulses 2+.   Problem List and Plan   Coronary atherosclerosis of native coronary artery Symptomatically stable on current medical regimen. I have asked him to check blood pressure more regularly, and if this remains elevated, he might benefit from an additional agent such as ACE inhibitor or Norvasc, also to serve as better antianginal control. Followup arranged.  Mixed hyperlipidemia Continue statin therapy. We will likely arrange followup lab work around the time of his next visit.    Jonelle Sidle, [name], F.A.C.C.

## 2011-11-28 NOTE — Assessment & Plan Note (Signed)
Symptomatically stable on current medical regimen. I have asked him to check blood pressure more regularly, and if this remains elevated, he might benefit from an additional agent such as ACE inhibitor or Norvasc, also to serve as better antianginal control. Followup arranged.

## 2012-01-20 ENCOUNTER — Emergency Department (HOSPITAL_COMMUNITY): Payer: PRIVATE HEALTH INSURANCE

## 2012-01-20 ENCOUNTER — Encounter (HOSPITAL_COMMUNITY): Payer: Self-pay | Admitting: *Deleted

## 2012-01-20 ENCOUNTER — Inpatient Hospital Stay (HOSPITAL_COMMUNITY)
Admission: EM | Admit: 2012-01-20 | Discharge: 2012-01-21 | DRG: 203 | Disposition: A | Discharge status: Home / Self Care | Payer: PRIVATE HEALTH INSURANCE | Attending: Internal Medicine | Admitting: Internal Medicine

## 2012-01-20 DIAGNOSIS — I2510 Atherosclerotic heart disease of native coronary artery without angina pectoris: Secondary | ICD-10-CM | POA: Diagnosis present

## 2012-01-20 DIAGNOSIS — Z79899 Other long term (current) drug therapy: Secondary | ICD-10-CM

## 2012-01-20 DIAGNOSIS — R079 Chest pain, unspecified: Secondary | ICD-10-CM | POA: Diagnosis present

## 2012-01-20 DIAGNOSIS — J40 Bronchitis, not specified as acute or chronic: Principal | ICD-10-CM

## 2012-01-20 DIAGNOSIS — E782 Mixed hyperlipidemia: Secondary | ICD-10-CM | POA: Diagnosis present

## 2012-01-20 DIAGNOSIS — F17200 Nicotine dependence, unspecified, uncomplicated: Secondary | ICD-10-CM | POA: Diagnosis present

## 2012-01-20 DIAGNOSIS — I10 Essential (primary) hypertension: Secondary | ICD-10-CM | POA: Diagnosis present

## 2012-01-20 DIAGNOSIS — R072 Precordial pain: Secondary | ICD-10-CM | POA: Diagnosis present

## 2012-01-20 LAB — BASIC METABOLIC PANEL
CO2: 22 mEq/L (ref 19–32)
Calcium: 9.6 mg/dL (ref 8.4–10.5)
Chloride: 96 mEq/L (ref 96–112)
Creatinine, Ser: 1.22 mg/dL (ref 0.50–1.35)
GFR calc Af Amer: 76 mL/min — ABNORMAL LOW (ref >90)
GFR calc non Af Amer: 66 mL/min — ABNORMAL LOW (ref >90)
Potassium: 3.6 mEq/L (ref 3.5–5.1)

## 2012-01-20 LAB — CBC
HCT: 37.5 % — ABNORMAL LOW (ref 39.0–52.0)
MCV: 84.1 fL (ref 78.0–100.0)
Platelets: 260 10*3/uL (ref 150–400)
RBC: 4.46 MIL/uL (ref 4.22–5.81)
WBC: 5.8 10*3/uL (ref 4.0–10.5)

## 2012-01-20 LAB — TROPONIN I: Troponin I: <0.3 ng/mL (ref <0.30)

## 2012-01-20 MED ORDER — MORPHINE SULFATE 2 MG/ML IJ SOLN: 8.0000 mg | Freq: Once | INTRAMUSCULAR | Status: AC

## 2012-01-20 MED ORDER — SODIUM CHLORIDE 0.9 % IV SOLN
Freq: Once | INTRAVENOUS | Status: AC
  Administered 2012-01-20: 11:00:00 via INTRAVENOUS

## 2012-01-20 MED ORDER — ASPIRIN EC 325 MG PO TBEC
325.0000 mg | DELAYED_RELEASE_TABLET | Freq: Every day | ORAL | Status: DC
  Administered 2012-01-21: 325 mg via ORAL
  Filled 2012-01-20: qty 1

## 2012-01-20 MED ORDER — SODIUM CHLORIDE 0.9 % IV SOLN
INTRAVENOUS | Status: DC
  Administered 2012-01-20 (×2): via INTRAVENOUS

## 2012-01-20 MED ORDER — SODIUM CHLORIDE 0.9 % IV SOLN: INTRAVENOUS | Status: DC

## 2012-01-20 MED ORDER — ONDANSETRON HCL 4 MG/2ML IJ SOLN
4.0000 mg | Freq: Once | INTRAMUSCULAR | Status: AC
  Administered 2012-01-20: 4 mg via INTRAVENOUS
  Filled 2012-01-20: qty 2

## 2012-01-20 MED ORDER — NITROGLYCERIN IN D5W 200-5 MCG/ML-% IV SOLN
5.0000 ug/min | INTRAVENOUS | Status: DC
  Administered 2012-01-20: 5 ug/min via INTRAVENOUS
  Filled 2012-01-20: qty 250

## 2012-01-20 MED ORDER — ENOXAPARIN SODIUM 40 MG/0.4ML ~~LOC~~ SOLN
40.0000 mg | SUBCUTANEOUS | Status: DC
  Administered 2012-01-20: 40 mg via SUBCUTANEOUS
  Filled 2012-01-20: qty 0.4

## 2012-01-20 MED ORDER — NITROGLYCERIN 2 % TD OINT
1.0000 [in_us] | TOPICAL_OINTMENT | Freq: Once | TRANSDERMAL | Status: AC
  Administered 2012-01-20: 1 [in_us] via TOPICAL
  Filled 2012-01-20: qty 1

## 2012-01-20 MED ORDER — NITROGLYCERIN 0.4 MG SL SUBL: 0.4000 mg | SUBLINGUAL_TABLET | SUBLINGUAL | Status: DC | PRN

## 2012-01-20 MED ORDER — ONDANSETRON HCL 4 MG PO TABS: 4.0000 mg | ORAL_TABLET | Freq: Four times a day (QID) | ORAL | Status: DC | PRN

## 2012-01-20 MED ORDER — ACETAMINOPHEN 325 MG PO TABS
650.0000 mg | ORAL_TABLET | Freq: Four times a day (QID) | ORAL | Status: DC | PRN
  Administered 2012-01-21: 650 mg via ORAL
  Filled 2012-01-20: qty 2

## 2012-01-20 MED ORDER — MORPHINE SULFATE 2 MG/ML IJ SOLN
2.0000 mg | INTRAMUSCULAR | Status: DC | PRN
  Administered 2012-01-20 – 2012-01-21 (×5): 2 mg via INTRAVENOUS
  Filled 2012-01-20 (×5): qty 1

## 2012-01-20 MED ORDER — METOPROLOL TARTRATE 25 MG PO TABS
25.0000 mg | ORAL_TABLET | Freq: Two times a day (BID) | ORAL | Status: DC
  Administered 2012-01-20 – 2012-01-21 (×2): 25 mg via ORAL
  Filled 2012-01-20 (×2): qty 1

## 2012-01-20 MED ORDER — ALBUTEROL SULFATE HFA 108 (90 BASE) MCG/ACT IN AERS: 2.0000 | INHALATION_SPRAY | Freq: Four times a day (QID) | RESPIRATORY_TRACT | Status: DC | PRN

## 2012-01-20 MED ORDER — MORPHINE SULFATE 2 MG/ML IJ SOLN: 8.0000 mg | Freq: Once | INTRAMUSCULAR | Status: DC

## 2012-01-20 MED ORDER — ONDANSETRON HCL 4 MG/2ML IJ SOLN
4.0000 mg | Freq: Four times a day (QID) | INTRAMUSCULAR | Status: DC | PRN
  Administered 2012-01-20: 4 mg via INTRAVENOUS
  Filled 2012-01-20: qty 2

## 2012-01-20 MED ORDER — NITROGLYCERIN 2 % TD OINT
1.0000 [in_us] | TOPICAL_OINTMENT | Freq: Four times a day (QID) | TRANSDERMAL | Status: DC
  Administered 2012-01-20 – 2012-01-21 (×4): 1 [in_us] via TOPICAL
  Filled 2012-01-20 (×4): qty 1

## 2012-01-20 MED ORDER — SODIUM CHLORIDE 0.9 % IJ SOLN
3.0000 mL | Freq: Two times a day (BID) | INTRAMUSCULAR | Status: DC
  Administered 2012-01-20 – 2012-01-21 (×2): 3 mL via INTRAVENOUS
  Filled 2012-01-20: qty 3

## 2012-01-20 MED ORDER — ACETAMINOPHEN 650 MG RE SUPP: 650.0000 mg | Freq: Four times a day (QID) | RECTAL | Status: DC | PRN

## 2012-01-20 MED ORDER — SIMVASTATIN 20 MG PO TABS
40.0000 mg | ORAL_TABLET | Freq: Every day | ORAL | Status: DC
  Administered 2012-01-20: 40 mg via ORAL
  Filled 2012-01-20: qty 2

## 2012-01-20 MED ORDER — ASPIRIN 81 MG PO CHEW
324.0000 mg | CHEWABLE_TABLET | Freq: Once | ORAL | Status: AC
  Administered 2012-01-20: 324 mg via ORAL
  Filled 2012-01-20: qty 4

## 2012-01-20 MED ORDER — MORPHINE SULFATE 10 MG/ML IJ SOLN
INTRAMUSCULAR | Status: AC
  Administered 2012-01-20: 8 mg via INTRAVENOUS
  Filled 2012-01-20: qty 1

## 2012-01-20 NOTE — Progress Notes (Signed)
Pt c/o mid chest pain increased from 2 to 4.  Pt resting in bed.  Wearing oxygen.  Stated he feels hot.  Temperature in room decreased.  Dr. Kerry Hough notified.

## 2012-01-20 NOTE — ED Notes (Signed)
Left sided cp radiating into left shoulder/arm that started around 0645 this morning with sob.

## 2012-01-20 NOTE — ED Provider Notes (Signed)
History   This chart was scribed for Osvaldo Human, MD by Toya Smothers, ED Scribe. The patient was seen in room APA14/APA14. Patient's care was started at 0946.  CSN: [id number]  Arrival date & time 01/20/12  4098   First MD Initiated Contact with Patient 01/20/12 1022      Chief Complaint  Patient presents with  . Chest Pain    HPI  Joshua Hall is a 56 y.o. male with a h/o who Coronary atherosclerosis and Colitis presents to the Emergency Department complaining of 4 hours of new, sudden onset,waxing and waning, intermittent, severe left sided chest pain with associated SOB. Pt does not recall the context of onset. Pain is 9/10, radiating to shoulder, and described as a "throbbing" with a pin sticking sensation. No aggravators or alleviators. Typically healthy, CC represents a moderate deviation from baseline health. Pt report mild relief with use of Nitroglycerin Patch. No abdominal pain, fever, chills, cough, congestion, rhinorrhea, or n/v/d. Pt is a 2 cigarette/day smoker, admitting occassional alcohol, and denying illicit drug use.     Past Medical History  Diagnosis Date  . Tobacco abuse   . Colitis   . Coronary atherosclerosis of native coronary artery     Largely nonobstructive in major epicardial was with 99% small diagonal stenosis - medical therapy    Past Surgical History  Procedure Date  . Mouth surgery     Family History  Problem Relation Age of Onset  . Heart disease Brother     Died of MI at 68  . Diabetes Mother     History  Substance Use Topics  . Smoking status: Former Smoker -- 0.7 packs/day for 40 years    Types: Cigarettes    Quit date: 10/17/2011  . Smokeless tobacco: Never Used  . Alcohol Use: Yes     Comment: Occasional      Review of Systems  Respiratory: Positive for shortness of breath.   Cardiovascular: Positive for chest pain.  All other systems reviewed and are negative.    Allergies  Review of patient's allergies  indicates no known allergies.  Home Medications   Current Outpatient Rx  Name  Route  Sig  Dispense  Refill  . ASPIRIN 81 MG PO TABS   Oral   Take 1 tablet (81 mg total) by mouth daily.   30 tablet      . METOPROLOL TARTRATE 25 MG PO TABS   Oral   Take 1 tablet (25 mg total) by mouth 2 (two) times daily.   60 tablet   11   . NITROGLYCERIN 0.2 MG/HR TD PT24   Transdermal   Place 1 patch (0.2 mg total) onto the skin daily.   30 patch   11   . NITROGLYCERIN 0.4 MG SL SUBL   Sublingual   Place 1 tablet (0.4 mg total) under the tongue every 5 (five) minutes as needed for chest pain.   25 tablet   3   . SIMVASTATIN 40 MG PO TABS   Oral   Take 1 tablet (40 mg total) by mouth at bedtime.   30 tablet   11     BP 141/78  Pulse 94  Temp 98.1 F (36.7 C) (Oral)  Resp 16  Ht 6' (1.829 m)  Wt 201 lb (91.173 kg)  BMI 27.26 kg/m2  SpO2 95%  Physical Exam  Nursing note and vitals reviewed. Constitutional: He is oriented to person, place, and time. He appears well-developed and well-nourished.  Moderate stress with chest pain.  HENT:  Head: Normocephalic and atraumatic.  Eyes: Conjunctivae normal and EOM are normal. Pupils are equal, round, and reactive to light.  Neck: Normal range of motion. Neck supple. No tracheal deviation present.  Cardiovascular: Normal rate, regular rhythm and normal heart sounds.   Pulmonary/Chest: Effort normal and breath sounds normal.  Abdominal: Soft. Bowel sounds are normal. There is no tenderness.  Musculoskeletal: Normal range of motion.       No edema in legs.  Lymphadenopathy:    He has no cervical adenopathy.  Neurological: He is alert and oriented to person, place, and time.       Neurologically intact.  Skin: Skin is warm and dry. No rash noted.  Psychiatric: He has a normal mood and affect.    ED Course  Procedures DIAGNOSTIC STUDIES: Oxygen Saturation is 95% on room air, normal by my interpretation.    COORDINATION  OF CARE: 09:53- Ordered EKG 12-Lead Once. 09:54- Ordered DG Chest Port 1 View 1 time imaging. 09:54- ED EKG (<6mins upon arrival to the ED) Once. 10:37- Evaluated Pt. Pt is awake, alert, and has moderate distress with chest pain.    Date: 01/20/2012  10:08 A.M.  Rate: 96  Rhythm: normal sinus rhythm  QRS Axis: left  Intervals: normal  ST/T Wave abnormalities: normal  Conduction Disutrbances:none  Narrative Interpretation: Normal EKG  Old EKG Reviewed: unchanged    12:03 PM Results for orders placed during the hospital encounter of 01/20/12  CBC      Component Value Range   WBC 5.8  4.0 - 10.5 K/uL   RBC 4.46  4.22 - 5.81 MIL/uL   Hemoglobin 13.9  13.0 - 17.0 g/dL   HCT 08.6 (*) 57.8 - 46.9 %   MCV 84.1  78.0 - 100.0 fL   MCH 31.2  26.0 - 34.0 pg   MCHC 37.1 (*) 30.0 - 36.0 g/dL   RDW 62.9  52.8 - 41.3 %   Platelets 260  150 - 400 K/uL  BASIC METABOLIC PANEL      Component Value Range   Sodium 135  135 - 145 mEq/L   Potassium 3.6  3.5 - 5.1 mEq/L   Chloride 96  96 - 112 mEq/L   CO2 22  19 - 32 mEq/L   Glucose, Bld 75  70 - 99 mg/dL   BUN 20  6 - 23 mg/dL   Creatinine, Ser 2.44  0.50 - 1.35 mg/dL   Calcium 9.6  8.4 - 01.0 mg/dL   GFR calc non Af Amer 66 (*) >90 mL/min   GFR calc Af Amer 76 (*) >90 mL/min  PRO B NATRIURETIC PEPTIDE      Component Value Range   Pro B Natriuretic peptide (BNP) 18.0  0 - 125 pg/mL  TROPONIN I      Component Value Range   Troponin I <0.30  <0.30 ng/mL   Dg Chest Port 1 View  01/20/2012  *RADIOLOGY REPORT*  Clinical Data: Chest pain  PORTABLE CHEST - 1 VIEW  Comparison: 10/18/2011  Findings: Cardiomediastinal silhouette is stable.  No acute infiltrate or pleural effusion.  No pulmonary edema.  Bony thorax is unremarkable.  IMPRESSION: No active disease.   Original Report Authenticated By: Natasha Mead, [name]    12:12 PM Pt's lab tests were good. His pain is better after IV NTG and IV morphine.  Will call [facility] to admit him for  chest pain observation.  12:56 PM Case discussed  with Dr. Kerry Hough.  He requested discontinuing the nitroglycerin continuous infusion, and placing nitro paste 1" topically.  Admit to Team 2 to a telemetry bed.     1. Chest pain   2. Coronary artery disease      I personally performed the services described in this documentation, which was scribed in my presence. The recorded information has been reviewed and is accurate.  Osvaldo Human, MD      Carleene Cooper III, MD 01/20/12 [phone]

## 2012-01-20 NOTE — ED Notes (Signed)
Patient denies pain and is resting comfortably.  

## 2012-01-20 NOTE — Plan of Care (Signed)
Problem: Phase I Progression Outcomes Goal: Aspirin unless contraindicated Outcome: Completed/Met Date Met:  01/20/12 Received Aspirin 325 mg in ED this morning.

## 2012-01-20 NOTE — H&P (Signed)
[facility] History and Physical  Joshua Hall:[id number] DOB: 10/01/1955 DOA: 01/20/2012  Referring physician: Dr. Ignacia Palma PCP: Dwana Melena, MD  Specialists: [facility]  Chief Complaint: chest pain  HPI: Joshua Hall is a 56 y.o. male history of coronary artery disease who presents to the hospital with complaints of chest pain. He recently underwent a cardiac catheterization at [facility] in September of 2013. Results indicated largely nonobstructive major coronary arteries with 99% stenosis in a small diagonal branch. This was felt to small to undergo intevention and therefore medical therapy was recommended. Patient has followed up with [facility] cardiology in the outpatient setting. He presents to the hospital with complaints of chest pain which has progressively gotten worse over the past week. He reports substernal chest pain which radiates to his left shoulder and it is generally worse with exertion. This resolved with rest. Each episode lasts from 5-79mins. He has associated shortness of breath and feels that his exercise tolerance has decreased. He becomes easily winded. He denies any worsening of his pain after eating. He does report having a dry hacking cough but has not had any fever. He has no nausea or vomiting. He has associated diaphoresis and lightheadedness. He reports wearing a nitro patch at home which was helping his symptoms, but his symptoms have progressively gotten worse. He was evaluated in the emergency room where his EKG was nonacute. He has been referred for admission.  Review of Systems: Pertinent positives as per HPI, otherwise negative  Past Medical History  Diagnosis Date  . Tobacco abuse   . Colitis   . Coronary atherosclerosis of native coronary artery     Largely nonobstructive in major epicardial was with 99% small diagonal stenosis - medical therapy   Past Surgical History  Procedure Date  . Mouth surgery   . Cardiac catheterization  10/2011    [facility]   Social History:  reports that he has been smoking Cigarettes.  He has a 10 pack-year smoking history. He has never used smokeless tobacco. He reports that he drinks alcohol. He reports that he does not use illicit drugs.   No Known Allergies  Family History  Problem Relation Age of Onset  . Heart disease Brother     Died of MI at 10  . Diabetes Mother    Prior to Admission medications   Medication Sig Start Date End Date Taking? Authorizing Provider  aspirin 81 MG tablet Take 1 tablet (81 mg total) by mouth daily. 10/20/11 10/19/12 Yes Rhonda G Barrett, PA  metoprolol tartrate (LOPRESSOR) 25 MG tablet Take 1 tablet (25 mg total) by mouth 2 (two) times daily. 10/20/11 10/19/12 Yes Rhonda G Barrett, PA  nitroGLYCERIN (NITRODUR - DOSED IN MG/24 HR) 0.2 mg/hr Place 1 patch (0.2 mg total) onto the skin daily. 10/20/11 10/19/12 Yes Rhonda G Barrett, PA  simvastatin (ZOCOR) 40 MG tablet Take 1 tablet (40 mg total) by mouth at bedtime. 10/20/11 10/19/12 Yes Rhonda G Barrett, PA  nitroGLYCERIN (NITROSTAT) 0.4 MG SL tablet Place 1 tablet (0.4 mg total) under the tongue every 5 (five) minutes as needed for chest pain. 10/20/11 10/19/12  Darrol Jump, PA   Physical Exam: Filed Vitals:   01/20/12 1225 01/20/12 1308 01/20/12 1341 01/20/12 1420  BP: 132/75 133/73 133/73 153/74  Pulse: 73  62 61  Temp:    98.1 F (36.7 C)  TempSrc:    Oral  Resp:  18  18  Height:    6' (1.829  m)  Weight:    91.173 kg (201 lb)  SpO2: 93% 96% 94% 94%     General:  Patient is sitting up in bed, appears to be comfortable  Eyes: Pupils are equal round reactive to light  ENT: Mucous members are moist  Neck: Supple  Cardiovascular: S1, S2, regular rate and rhythm, no pedal edema bilaterally  Respiratory: Clear to auscultation bilaterally  Abdomen: Soft, nontender, nondistended, bowel sounds are active  Skin: Normal  Musculoskeletal: Deferred  Psychiatric: Normal affect, cooperative  with exam  Neurologic: Grossly intact, nonfocal  Labs on Admission:  Basic Metabolic Panel:  Lab 01/20/12 1610  NA 135  K 3.6  CL 96  CO2 22  GLUCOSE 75  BUN 20  CREATININE 1.22  CALCIUM 9.6  MG --  PHOS --   Liver Function Tests: No results found for this basename: AST:5,ALT:5,ALKPHOS:5,BILITOT:5,PROT:5,ALBUMIN:5 in the last 168 hours No results found for this basename: LIPASE:5,AMYLASE:5 in the last 168 hours No results found for this basename: AMMONIA:5 in the last 168 hours CBC:  Lab 01/20/12 1015  WBC 5.8  NEUTROABS --  HGB 13.9  HCT 37.5*  MCV 84.1  PLT 260   Cardiac Enzymes:  Lab 01/20/12 1015  CKTOTAL --  CKMB --  CKMBINDEX --  TROPONINI <0.30    BNP (last 3 results)  Basename 01/20/12 1015  PROBNP 18.0   CBG: No results found for this basename: GLUCAP:5 in the last 168 hours  Radiological Exams on Admission: Dg Chest Port 1 View  01/20/2012  *RADIOLOGY REPORT*  Clinical Data: Chest pain  PORTABLE CHEST - 1 VIEW  Comparison: 10/18/2011  Findings: Cardiomediastinal silhouette is stable.  No acute infiltrate or pleural effusion.  No pulmonary edema.  Bony thorax is unremarkable.  IMPRESSION: No active disease.   Original Report Authenticated By: Natasha Mead, [name]     EKG: Independently reviewed. No acute ST or T changes  Assessment/Plan Active Problems:  Mixed hyperlipidemia  Chest pain  Coronary artery disease  Hypertension   1. Chest pain. The cause of his pain is likely stable angina. The patient recently had a coronary angiogram which did not reveal any significance of stenosis in the larger coronary arteries. We will cycle his cardiac markers overnight. Repeat EKG in the morning. We will ask for a cardiology consultation to help optimize his medical regimen. He may benefit from Imdur/ranolazine. We'll defer to cardiology. We'll continue him on aspirin and beta blockers. He will be placed on nitro paste for now. The patient continues to smoke  and was counseled regarding the importance of tobacco cessation. His shortness of breath and cough may be related to an underlying bronchitis. We will give a albuterol inhaler. 2. Hypertension. Continue outpatient regimen 3. Hyperlipidemia. Continue outpatient regimen  Code Status: Full code Family Communication: Discussed with patient Disposition Plan: Likely discharge tomorrow if no further workup.  Time spent:  MEMON,JEHANZEB [facility] Pager [phone]  If 7PM-7AM, please contact night-coverage [email or web address] Password [facility] 01/20/2012, 6:04 PM

## 2012-01-21 DIAGNOSIS — J40 Bronchitis, not specified as acute or chronic: Secondary | ICD-10-CM

## 2012-01-21 LAB — D-DIMER, QUANTITATIVE: D-Dimer, Quant: <0.27 ug/mL-FEU (ref 0.00–0.48)

## 2012-01-21 MED ORDER — ALBUTEROL SULFATE HFA 108 (90 BASE) MCG/ACT IN AERS: 2.0000 | INHALATION_SPRAY | Freq: Four times a day (QID) | RESPIRATORY_TRACT | Status: AC | PRN

## 2012-01-21 MED ORDER — GUAIFENESIN ER 600 MG PO TB12: 1200.0000 mg | ORAL_TABLET | Freq: Two times a day (BID) | ORAL | Status: DC

## 2012-01-21 MED ORDER — LEVOFLOXACIN 500 MG PO TABS: 500.0000 mg | ORAL_TABLET | Freq: Every day | ORAL | Status: DC

## 2012-01-21 NOTE — Progress Notes (Signed)
UR Chart Review Completed  

## 2012-01-21 NOTE — Consult Note (Signed)
CARDIOLOGY CONSULT NOTE  Patient ID: DAMARKUS BALIS MRN: [id number] DOB/AGE: Mar 22, 1955 56 y.o.  Admit date: 01/20/2012 Referring Physician: PTH: Jeannie Fend, MD Primary Cardiologist: Dr. Diona Browner Reason for Consultation: Recurrent chest pain in the setting of largely nonobstructive CAD  HPI: Mr. Lurz is a 56 year old patient of Dr. Diona Browner that presented to ER with complaints of substernal chest pain while walking [place] his [place], with associated DOE and weakness. He states that two days prior to admission, he was walking in woods near his home,  to retrieve cows that had gotten out, and became very disoriented, "I felt lost" and dyspnec, and had to sit down to rest. He states the pain also occurs with sitting up from lying position. Described as sharp, tight, radiating to the left side of his chest and into his shoulder. He has received morphine and nitrates which have help only temporarily. He used NTG at home, with some relief, but pain returned.   He is now having a congested cough with discomfort while taking deep breaths, and with coughing. He continues to smoke 2 cigarettes a day. He is status post cardiac catheterization per Dr. Yoe Lions in September. This procedure revealed a 30% stenosis in the proximal and mid LAD, the second diagonal was a very small branch with only 1 mm in diameter. It had a 99% stenosis proximally. The circumflex was normal, the right coronary artery had mild nonobstructive disease in the mid and distal vessel up to 10-20%. His LV function was normal with an LVEF of 55-65%. He has been treated medically and was placed on a nitroglycerin transdermal patch at 0.2 mg daily. He is also been placed on metoprolol 25 mg twice a day along with aspirin and Zocor. He followed up in the office with Dr. Diona Browner, being seen last on 10/26/2011 and was without symptoms.   Review of labs demonstrate negative cardiac enzymes X 4.  CXR is negative for CHF or  pneumonia. Potassium is 3.6. WBC are not elevated. He is not anemic.EKG shows normal sinus rhythm with no evidence of acute ischemia.    Review of systems complete and found to be negative unless listed above   Past Medical History  Diagnosis Date  . Tobacco abuse   . Colitis   . Coronary atherosclerosis of native coronary artery     Largely nonobstructive in major epicardial was with 99% small diagonal stenosis - medical therapy    Family History  Problem Relation Age of Onset  . Heart disease Brother     Died of MI at 54  . Diabetes Mother     History   Social History  . Marital Status: Divorced    Spouse Name: N/A    Number of Children: N/A  . Years of Education: N/A   Occupational History  . Not on file.   Social History Main Topics  . Smoking status: Current Every Day Smoker -- 0.2 packs/day for 40 years    Types: Cigarettes  . Smokeless tobacco: Never Used  . Alcohol Use: Yes     Comment: Occasional  . Drug Use: No  . Sexually Active: Yes   Other Topics Concern  . Not on file   Social History Narrative  . No narrative on file    Past Surgical History  Procedure Date  . Mouth surgery   . Cardiac catheterization 10/2011    [facility]     Prescriptions prior to admission  Medication Sig Dispense Refill  . aspirin  81 MG tablet Take 1 tablet (81 mg total) by mouth daily.  30 tablet    . metoprolol tartrate (LOPRESSOR) 25 MG tablet Take 1 tablet (25 mg total) by mouth 2 (two) times daily.  60 tablet  11  . nitroGLYCERIN (NITRODUR - DOSED IN MG/24 HR) 0.2 mg/hr Place 1 patch (0.2 mg total) onto the skin daily.  30 patch  11  . simvastatin (ZOCOR) 40 MG tablet Take 1 tablet (40 mg total) by mouth at bedtime.  30 tablet  11  . nitroGLYCERIN (NITROSTAT) 0.4 MG SL tablet Place 1 tablet (0.4 mg total) under the tongue every 5 (five) minutes as needed for chest pain.  25 tablet  3   Physical Exam: Blood pressure 116/66, pulse 62, temperature 98.1 F (36.7  C), temperature source Oral, resp. rate 20, height 6' (1.829 m), weight 201 lb (91.173 kg), SpO2 99.00%.  General: Well developed, well nourished, in no acute distress Head: Eyes PERRLA, No xanthomas.   Normal cephalic and atramatic  Lungs: Rhonchi noted in the upper lobes with wet cough noted on inspiration. Nasal congestion is prominent.  Heart: HRRR S1 S2,tachycardic without MRG.  Pulses are 2+ & equal.            No carotid bruit. No JVD.  No abdominal bruits. No femoral bruits. Abdomen: Bowel sounds are positive, abdomen soft and non-tender without masses or                  Hernia's noted. Msk:  Back normal, normal gait. Normal strength and tone for age. Extremities: No clubbing, cyanosis or edema.  DP +1 Neuro: Alert and oriented X 3. Psych:  Good affect, responds appropriately   Labs:   Lab Results  Component Value Date   WBC 5.8 01/20/2012   HGB 13.9 01/20/2012   HCT 37.5* 01/20/2012   MCV 84.1 01/20/2012   PLT 260 01/20/2012     Lab 01/20/12 1015  NA 135  K 3.6  CL 96  CO2 22  BUN 20  CREATININE 1.22  CALCIUM 9.6  PROT --  BILITOT --  ALKPHOS --  ALT --  AST --  GLUCOSE 75   Lab Results  Component Value Date   CKTOTAL 164 10/18/2011   CKMB 2.5 10/18/2011   TROPONINI <0.30 01/21/2012    Lab Results  Component Value Date   CHOL 191 10/19/2011   CHOL  Value: 162        ATP III CLASSIFICATION:  <200     mg/dL   Desirable  147-829  mg/dL   Borderline High  >=562    mg/dL   High        03/14/8655   Lab Results  Component Value Date   HDL 48 10/19/2011   HDL 42 08/27/2009   Lab Results  Component Value Date   LDLCALC 118* 10/19/2011   LDLCALC  Value: 104        Total Cholesterol/HDL:CHD Risk Coronary Heart Disease Risk Table                     Men   Women  1/2 Average Risk   3.4   3.3  Average Risk       5.0   4.4  2 X Average Risk   9.6   7.1  3 X Average Risk  23.4   11.0        Use the calculated Patient Ratio above and the CHD Risk Table  to determine the patient's  CHD Risk.        ATP III CLASSIFICATION (LDL):  <100     mg/dL   Optimal  161-096  mg/dL   Near or Above                    Optimal  130-159  mg/dL   Borderline  045-409  mg/dL   High  >811     mg/dL   Very High* 10/27/7827   Lab Results  Component Value Date   TRIG 123 10/19/2011   TRIG 81 08/27/2009   Lab Results  Component Value Date   CHOLHDL 4.0 10/19/2011   CHOLHDL 3.9 08/27/2009     Radiology: Dg Chest Port 1 View  01/20/2012  *RADIOLOGY REPORT*  Clinical Data: Chest pain  PORTABLE CHEST - 1 VIEW  Comparison: 10/18/2011  Findings: Cardiomediastinal silhouette is stable.  No acute infiltrate or pleural effusion.  No pulmonary edema.  Bony thorax is unremarkable.  IMPRESSION: No active disease.   Original Report Authenticated By: Natasha Mead, [name]    EKG: NSR with no evidence of ischemia  ASSESSMENT AND PLAN:   1. Exertional Angina: Review of cath report shows non-obstructive CAD but also a 99% of a very small second diagonal (1 mm in diameter) in Sept. He is being treated as an outpatient with BB, ASA, and nitrates, but pain has recurred over the last week in the setting of what may be a developing bronchitis. No EKG changes, negative CE.  2. DOE: He unfortunately continues to smoke and now has some chest congestion on exam. He is being treated with albuterol prn. I will check D-dimer to rule out PE. Can consider bronchitis as possible differential. WBC are not elevated.  Can consider PFT's to evaluate for COPD. Also need to assess for exertional hypoxia.   Signed: Bettey Mare. Lyman Bishop NP [facility] 01/21/2012, 11:07 AM Co-Sign MD   Attending note:  Patient seen and examined. Reviewed records and modified above note per Joni Reining NP. Patient presents with intermittent angina symptoms with known history of occlusive CAD involving a small diagonal branch that is best managed medically. He reports compliance with medications. Has also been experiencing shortness of breath  with activity and most recently a raspy cough with chest congestion. States that he has been around sick family members.  ECG shows early repolarization with no acute findings, cardiac markers have been normal. Chest x-ray without acute infiltrate. On examination he is comfortable, still with intermittent cough and chest congestion. He is afebrile, blood pressure 116/66, heart rate in the 60s and sinus rhythm. Lungs exhibit coarse rhonchi with no active wheezing, cardiac exam with regular rate and rhythm, no distal pitting edema.  The patient is likely experiencing intermittent angina symptoms in the setting of what could potentially be a developing bronchitis or viral process. No clear evidence of ACS by cardiac markers or ECG. Would still continue with plans for medical therapy as treatment for his known CAD. Stay on aspirin, Lopressor, and nitroglycerin patch as well as statin. This can be modified further depending on symptom control and vital signs. We will follow with you.  Jonelle Sidle, [name], F.A.C.C.

## 2012-01-29 NOTE — Discharge Summary (Signed)
Physician Discharge Summary  Joshua Hall:[id number] DOB: 1955-10-01 DOA: 01/20/2012  PCP: Dwana Melena, MD  Admit date: 01/20/2012 Discharge date: 01/21/2012  Time spent: 35 minutes  Recommendations for Outpatient Follow-up:  1. Patient will follow up with primary care physician and cardiology as an outpatient.  Discharge Diagnoses:  Active Problems:  Mixed hyperlipidemia  Chest pain  Coronary artery disease  Hypertension  Bronchitis   Discharge Condition: Stable  Diet recommendation: Low salt  Filed Weights   01/20/12 0955 01/20/12 1420  Weight: 91.173 kg (201 lb) 91.173 kg (201 lb)    History of present illness:  Joshua Hall is a 56 y.o. male history of coronary artery disease who presents to the hospital with complaints of chest pain. He recently underwent a cardiac catheterization at [facility] in September of 2013. Results indicated largely nonobstructive major coronary arteries with 99% stenosis in a small diagonal branch. This was felt to small to undergo intevention and therefore medical therapy was recommended. Patient has followed up with [facility] cardiology in the outpatient setting. He presents to the hospital with complaints of chest pain which has progressively gotten worse over the past week. He reports substernal chest pain which radiates to his left shoulder and it is generally worse with exertion. This resolved with rest. Each episode lasts from 5-23mins. He has associated shortness of breath and feels that his exercise tolerance has decreased. He becomes easily winded. He denies any worsening of his pain after eating. He does report having a dry hacking cough but has not had any fever. He has no nausea or vomiting. He has associated diaphoresis and lightheadedness. He reports wearing a nitro patch at home which was helping his symptoms, but his symptoms have progressively gotten worse. He was evaluated in the emergency room where his EKG was nonacute. He has  been referred for admission.   Hospital Course:  This gentleman was admitted to the hospital for further evaluation of chest pain. He recently underwent cardiac catheterization which did indicated largely nonobstructive major coronary arteries. He's been managed as an outpatient with nitroglycerin patch and has been following with cardiology. He was admitted to the hospital, and ruled out for acute myocardial infarction with negative cardiac markers and a nonacute EKG. He was seen by cardiology, and no further adjustment in medications at this time was recommended. On further discussion with the patient, it was felt that his pain significantly got worse with coughing. He did feel that he was developing a cold and had significant respiratory secretions that he was having difficulty expectorating. He also reports having some sick contacts. He was started on albuterol inhaler and given a course of levofloxacin. He was cleared for discharge by the cardiology service. We will continue treatment of his bronchitis as an outpatient. Patient was ambulated in the halls on room air and maintained good oxygen saturations, above 94%. He will follow up with his primary care physician and cardiology service as an outpatient.  Procedures:  None  Consultations:  Cardiology, Dr. Diona Browner  Discharge Exam: Filed Vitals:   01/20/12 2326 01/21/12 0505 01/21/12 0605 01/21/12 0739  BP:   111/66 116/66  Pulse: 60 64 54 62  Temp:   98.1 F (36.7 C)   TempSrc:   Oral   Resp:   18 20  Height:      Weight:      SpO2:   100% 99%    General: No acute distress Cardiovascular: S1, S2, regular rate Respiratory: Occasional rhonchi bilaterally  Discharge Instructions  Discharge Orders    Future Appointments: Provider: Department: Dept Phone: Center:   03/04/2012 8:20 AM Jonelle Sidle, MD [facility] [phone] [id number]     Future Orders Please Complete By Expires   Diet - low sodium  heart healthy      Increase activity slowly          Medication List     As of 01/29/2012  8:01 AM    TAKE these medications         albuterol 108 (90 BASE) MCG/ACT inhaler   Commonly known as: PROVENTIL HFA;VENTOLIN HFA   Inhale 2 puffs into the lungs every 6 (six) hours as needed for wheezing or shortness of breath.      aspirin 81 MG tablet   Take 1 tablet (81 mg total) by mouth daily.      guaiFENesin 600 MG 12 hr tablet   Commonly known as: MUCINEX   Take 2 tablets (1,200 mg total) by mouth 2 (two) times daily.      levofloxacin 500 MG tablet   Commonly known as: LEVAQUIN   Take 1 tablet (500 mg total) by mouth daily.      metoprolol tartrate 25 MG tablet   Commonly known as: LOPRESSOR   Take 1 tablet (25 mg total) by mouth 2 (two) times daily.      nitroGLYCERIN 0.2 mg/hr   Commonly known as: NITRODUR - Dosed in mg/24 hr   Place 1 patch (0.2 mg total) onto the skin daily.      nitroGLYCERIN 0.4 MG SL tablet   Commonly known as: NITROSTAT   Place 1 tablet (0.4 mg total) under the tongue every 5 (five) minutes as needed for chest pain.      simvastatin 40 MG tablet   Commonly known as: ZOCOR   Take 1 tablet (40 mg total) by mouth at bedtime.           Follow-up Information    Follow up with [facility], MD. Schedule an appointment as soon as possible for a visit in 2 weeks.   Contact information:   [address] [place] [address] [phone]       Follow up with Nona Dell, MD. Schedule an appointment as soon as possible for a visit in 2 weeks.   Contact information:   [address] [place] [address] [phone]           The results of significant diagnostics from this hospitalization (including imaging, microbiology, ancillary and laboratory) are listed below for reference.    Significant Diagnostic Studies: Dg Chest Port 1 View  01/20/2012  *RADIOLOGY REPORT*  Clinical Data: Chest pain  PORTABLE CHEST - 1 VIEW   Comparison: 10/18/2011  Findings: Cardiomediastinal silhouette is stable.  No acute infiltrate or pleural effusion.  No pulmonary edema.  Bony thorax is unremarkable.  IMPRESSION: No active disease.   Original Report Authenticated By: Natasha Mead, [name]     Microbiology: No results found for this or any previous visit (from the past 240 hour(s)).   Labs: Basic Metabolic Panel: No results found for this basename: NA:5,K:5,CL:5,CO2:5,GLUCOSE:5,BUN:5,CREATININE:5,CALCIUM:5,MG:5,PHOS:5 in the last 168 hours Liver Function Tests: No results found for this basename: AST:5,ALT:5,ALKPHOS:5,BILITOT:5,PROT:5,ALBUMIN:5 in the last 168 hours No results found for this basename: LIPASE:5,AMYLASE:5 in the last 168 hours No results found for this basename: AMMONIA:5 in the last 168 hours CBC: No results found for this basename: WBC:5,NEUTROABS:5,HGB:5,HCT:5,MCV:5,PLT:5 in the last 168 hours Cardiac Enzymes:  No results found for this basename: CKTOTAL:5,CKMB:5,CKMBINDEX:5,TROPONINI:5 in the last 168 hours BNP: BNP (last 3 results)  Basename 01/20/12 1015  PROBNP 18.0   CBG: No results found for this basename: GLUCAP:5 in the last 168 hours     Signed:  Teri Diltz  [facility] 01/29/2012, 8:01 AM

## 2012-03-04 ENCOUNTER — Ambulatory Visit (INDEPENDENT_AMBULATORY_CARE_PROVIDER_SITE_OTHER): Payer: PRIVATE HEALTH INSURANCE | Admitting: Cardiology

## 2012-03-04 ENCOUNTER — Encounter: Payer: Self-pay | Admitting: Cardiology

## 2012-03-04 VITALS — BP 110/70 | HR 74 | Ht 72.0 in | Wt 212.0 lb

## 2012-03-04 DIAGNOSIS — E782 Mixed hyperlipidemia: Secondary | ICD-10-CM

## 2012-03-04 DIAGNOSIS — I2510 Atherosclerotic heart disease of native coronary artery without angina pectoris: Secondary | ICD-10-CM

## 2012-03-04 DIAGNOSIS — I10 Essential (primary) hypertension: Secondary | ICD-10-CM

## 2012-03-04 NOTE — Assessment & Plan Note (Signed)
Symptomatically stable on current medical regimen. No changes made today. Continue observation.

## 2012-03-04 NOTE — Patient Instructions (Addendum)
Your physician recommends that you schedule a follow-up appointment in: 6 MONTHS  Your physician recommends that you return for lab work in: THIS WEEK (LIVER, LIPIDS) SLIPS GIVEN  

## 2012-03-04 NOTE — Assessment & Plan Note (Signed)
Followup FLP and LFT arranged. He continues on statin therapy.

## 2012-03-04 NOTE — Progress Notes (Signed)
   Clinical Summary Mr. Maddy is a 57 y.o.male presenting for followup. He was seen in October 2013. Interval records reviewed including hospital evaluation in December 2013 related to chest pain symptoms, possible angina in the setting of bronchitis. He ruled out for myocardial infarction, was managed medically.  He states that since that time he has done well, no angina symptoms. Reports compliance with his medications. Still does work on his farm, works full time for the city of [employer]. No limiting symptoms.  He is due for followup assessment of lipids and liver function.   No Known Allergies  Current Outpatient Prescriptions  Medication Sig Dispense Refill  . albuterol (PROVENTIL HFA;VENTOLIN HFA) 108 (90 BASE) MCG/ACT inhaler Inhale 2 puffs into the lungs every 6 (six) hours as needed for wheezing or shortness of breath.  1 Inhaler  0  . aspirin 81 MG tablet Take 1 tablet (81 mg total) by mouth daily.  30 tablet    . metoprolol tartrate (LOPRESSOR) 25 MG tablet Take 1 tablet (25 mg total) by mouth 2 (two) times daily.  60 tablet  11  . nitroGLYCERIN (NITRODUR - DOSED IN MG/24 HR) 0.2 mg/hr Place 1 patch (0.2 mg total) onto the skin daily.  30 patch  11  . nitroGLYCERIN (NITROSTAT) 0.4 MG SL tablet Place 1 tablet (0.4 mg total) under the tongue every 5 (five) minutes as needed for chest pain.  25 tablet  3  . simvastatin (ZOCOR) 40 MG tablet Take 1 tablet (40 mg total) by mouth at bedtime.  30 tablet  11    Past Medical History  Diagnosis Date  . Colitis   . Coronary atherosclerosis of native coronary artery     Largely nonobstructive in major epicardial was with 99% small diagonal stenosis - medical therapy    Social History Mr. Kazmi reports that he has been smoking Cigarettes.  He has a 10 pack-year smoking history. He has never used smokeless tobacco. Mr. Tierce reports that he drinks alcohol.  Review of Systems No palpitations, dizziness, syncope. No reported  bleeding episodes. No claudication. Stable appetite. Otherwise negative.  Physical Examination Filed Vitals:   03/04/12 0822  BP: 110/70  Pulse: 74   Filed Weights   03/04/12 0822  Weight: 212 lb (96.163 kg)    Patient no acute distress.  HEENT: Conjunctiva and lids normal, oropharynx clear with moist mucosa.  Neck: Supple, no elevated JVP or carotid bruits, no thyromegaly.  Lungs: Clear to auscultation, nonlabored breathing at rest.  Cardiac: Regular rate and rhythm, no S3, 2/6 systolic murmur, no pericardial rub.  Abdomen: Soft, nontender, bowel sounds present, no guarding or rebound.  Extremities: No pitting edema, distal pulses 2+.   Problem List and Plan   Coronary atherosclerosis of native coronary artery Symptomatically stable on current medical regimen. No changes made today. Continue observation.  Essential hypertension, benign Blood pressure control is good today.  Mixed hyperlipidemia Followup FLP and LFT arranged. He continues on statin therapy.    Jonelle Sidle, [name], F.A.C.C.

## 2012-03-04 NOTE — Assessment & Plan Note (Signed)
Blood pressure control is good today. 

## 2012-03-25 ENCOUNTER — Encounter: Payer: Self-pay | Admitting: *Deleted

## 2012-03-25 ENCOUNTER — Telehealth: Payer: Self-pay | Admitting: *Deleted

## 2012-03-25 DIAGNOSIS — I10 Essential (primary) hypertension: Secondary | ICD-10-CM

## 2012-03-25 DIAGNOSIS — E782 Mixed hyperlipidemia: Secondary | ICD-10-CM

## 2012-03-25 NOTE — Telephone Encounter (Signed)
Placed orders in chart for pt to have labs drawn, per did not have drawn as advised during OV, mailed reminder letter

## 2012-07-24 ENCOUNTER — Encounter (HOSPITAL_COMMUNITY): Payer: Self-pay | Admitting: *Deleted

## 2012-07-24 ENCOUNTER — Other Ambulatory Visit: Payer: Self-pay

## 2012-07-24 ENCOUNTER — Observation Stay (HOSPITAL_COMMUNITY)
Admission: EM | Admit: 2012-07-24 | Discharge: 2012-07-25 | Disposition: A | Discharge status: Home / Self Care | Payer: PRIVATE HEALTH INSURANCE | Attending: Internal Medicine | Admitting: Internal Medicine

## 2012-07-24 ENCOUNTER — Emergency Department (HOSPITAL_COMMUNITY): Payer: PRIVATE HEALTH INSURANCE

## 2012-07-24 DIAGNOSIS — R079 Chest pain, unspecified: Principal | ICD-10-CM

## 2012-07-24 DIAGNOSIS — E876 Hypokalemia: Secondary | ICD-10-CM

## 2012-07-24 DIAGNOSIS — E782 Mixed hyperlipidemia: Secondary | ICD-10-CM | POA: Diagnosis present

## 2012-07-24 DIAGNOSIS — R0602 Shortness of breath: Secondary | ICD-10-CM | POA: Insufficient documentation

## 2012-07-24 DIAGNOSIS — I2510 Atherosclerotic heart disease of native coronary artery without angina pectoris: Secondary | ICD-10-CM | POA: Diagnosis present

## 2012-07-24 DIAGNOSIS — E785 Hyperlipidemia, unspecified: Secondary | ICD-10-CM | POA: Insufficient documentation

## 2012-07-24 DIAGNOSIS — I200 Unstable angina: Secondary | ICD-10-CM | POA: Diagnosis present

## 2012-07-24 DIAGNOSIS — I10 Essential (primary) hypertension: Secondary | ICD-10-CM | POA: Diagnosis present

## 2012-07-24 LAB — CBC WITH DIFFERENTIAL/PLATELET
Basophils Relative: 0 % (ref 0–1)
Eosinophils Absolute: 0.2 10*3/uL (ref 0.0–0.7)
Hemoglobin: 13.5 g/dL (ref 13.0–17.0)
Lymphs Abs: 2.1 10*3/uL (ref 0.7–4.0)
MCH: 30.5 pg (ref 26.0–34.0)
MCHC: 36.4 g/dL — ABNORMAL HIGH (ref 30.0–36.0)
Monocytes Relative: 9 % (ref 3–12)
Neutro Abs: 3 10*3/uL (ref 1.7–7.7)
Neutrophils Relative %: 51 % (ref 43–77)
Platelets: 251 10*3/uL (ref 150–400)
RBC: 4.42 MIL/uL (ref 4.22–5.81)

## 2012-07-24 LAB — BASIC METABOLIC PANEL
BUN: 15 mg/dL (ref 6–23)
Chloride: 99 mEq/L (ref 96–112)
GFR calc Af Amer: 74 mL/min — ABNORMAL LOW (ref >90)
GFR calc non Af Amer: 64 mL/min — ABNORMAL LOW (ref >90)
Potassium: 2.9 mEq/L — ABNORMAL LOW (ref 3.5–5.1)
Sodium: 137 mEq/L (ref 135–145)

## 2012-07-24 MED ORDER — NITROGLYCERIN 0.4 MG SL SUBL
0.4000 mg | SUBLINGUAL_TABLET | SUBLINGUAL | Status: DC | PRN
  Administered 2012-07-24 (×2): 0.4 mg via SUBLINGUAL
  Filled 2012-07-24: qty 25

## 2012-07-24 MED ORDER — ASPIRIN 81 MG PO CHEW
324.0000 mg | CHEWABLE_TABLET | Freq: Once | ORAL | Status: AC
  Administered 2012-07-24: 324 mg via ORAL
  Filled 2012-07-24: qty 4

## 2012-07-24 NOTE — ED Notes (Signed)
Pt with right sided cp x 2 hours with SOB and diaphoresis, denies N/V

## 2012-07-24 NOTE — ED Provider Notes (Signed)
History     CSN: [id number]  Arrival date & time 07/24/12  2136   First MD Initiated Contact with Patient 07/24/12 2213      Chief Complaint  Patient presents with  . Chest Pain    (Consider location/radiation/quality/duration/timing/severity/associated sxs/prior treatment) Patient is a 57 y.o. male presenting with chest pain. The history is provided by the patient.  Chest Pain Pain location:  Substernal area and L chest Associated symptoms: diaphoresis, nausea and shortness of breath   Associated symptoms: no abdominal pain, no back pain, no headache, no numbness, not vomiting and no weakness    patient with chest pain began around 2 hours prior to arrival. Described as pressure. He squeezes his hand over his sternum to describe this. He had occasional sweating. Mild nausea. This had some shortness of breath has gotten worse over the last few days but has not had chest pain until today. He sees [facility] and has had previous heart cath but never had a stent. He's had medical management. He is on a nitroglycerin patch but did not take any sublingual. No fevers. No cough. No abdominal pain. No blood in stool. He states his shortness of breath has gotten worse but if he stops his exertion the shortness of breath will go away.  Past Medical History  Diagnosis Date  . Colitis   . Coronary atherosclerosis of native coronary artery     Largely nonobstructive in major epicardial was with 99% small diagonal stenosis - medical therapy    Past Surgical History  Procedure Laterality Date  . Mouth surgery    . Cardiac catheterization  10/2011    [facility] [place]    Family History  Problem Relation Age of Onset  . Heart disease Brother     Died of MI at 46  . Diabetes Mother     History  Substance Use Topics  . Smoking status: Current Every Day Smoker -- 0.25 packs/day for 40 years    Types: Cigarettes  . Smokeless tobacco: Never Used  . Alcohol Use: Yes     Comment:  Occasional      Review of Systems  Constitutional: Positive for diaphoresis. Negative for activity change and appetite change.  HENT: Negative for neck stiffness.   Eyes: Negative for pain.  Respiratory: Positive for chest tightness and shortness of breath.   Cardiovascular: Positive for chest pain. Negative for leg swelling.  Gastrointestinal: Positive for nausea. Negative for vomiting, abdominal pain and diarrhea.  Genitourinary: Negative for flank pain.  Musculoskeletal: Negative for back pain.  Skin: Negative for rash.  Neurological: Negative for weakness, numbness and headaches.  Psychiatric/Behavioral: Negative for behavioral problems.    Allergies  Review of patient's allergies indicates no known allergies.  Home Medications   Current Outpatient Rx  Name  Route  Sig  Dispense  Refill  . aspirin 81 MG tablet   Oral   Take 1 tablet (81 mg total) by mouth daily.   30 tablet      . metoprolol tartrate (LOPRESSOR) 25 MG tablet   Oral   Take 1 tablet (25 mg total) by mouth 2 (two) times daily.   60 tablet   11   . nitroGLYCERIN (NITRODUR - DOSED IN MG/24 HR) 0.2 mg/hr   Transdermal   Place 1 patch (0.2 mg total) onto the skin daily.   30 patch   11   . nitroGLYCERIN (NITROSTAT) 0.4 MG SL tablet   Sublingual   Place 1 tablet (0.4  mg total) under the tongue every 5 (five) minutes as needed for chest pain.   25 tablet   3   . simvastatin (ZOCOR) 40 MG tablet   Oral   Take 1 tablet (40 mg total) by mouth at bedtime.   30 tablet   11   . albuterol (PROVENTIL HFA;VENTOLIN HFA) 108 (90 BASE) MCG/ACT inhaler   Inhalation   Inhale 2 puffs into the lungs every 6 (six) hours as needed for wheezing or shortness of breath.   1 Inhaler   0     BP 100/58  Pulse 82  Temp(Src) 98.3 F (36.8 C) (Oral)  Resp 21  Ht 6' (1.829 m)  Wt 205 lb (92.987 kg)  BMI 27.8 kg/m2  SpO2 97%  Physical Exam  Nursing note and vitals reviewed. Constitutional: He is oriented to  person, place, and time. He appears well-developed and well-nourished.  HENT:  Head: Normocephalic and atraumatic.  Eyes: EOM are normal. Pupils are equal, round, and reactive to light.  Neck: Normal range of motion. Neck supple.  Cardiovascular: Normal rate, regular rhythm and normal heart sounds.   No murmur heard. Pulmonary/Chest: Effort normal and breath sounds normal.  Abdominal: Soft. Bowel sounds are normal. He exhibits no distension and no mass. There is no tenderness. There is no rebound and no guarding.  Musculoskeletal: Normal range of motion. He exhibits no edema.  Neurological: He is alert and oriented to person, place, and time. No cranial nerve deficit.  Skin: Skin is warm and dry.  Psychiatric: He has a normal mood and affect.    ED Course  Procedures (including critical care time)  Labs Reviewed  CBC WITH DIFFERENTIAL - Abnormal; Notable for the following:    HCT 37.1 (*)    MCHC 36.4 (*)    All other components within normal limits  BASIC METABOLIC PANEL - Abnormal; Notable for the following:    Potassium 2.9 (*)    Glucose, Bld 129 (*)    GFR calc non Af Amer 64 (*)    GFR calc Af Amer 74 (*)    All other components within normal limits  TROPONIN I   Dg Chest Portable 1 View  07/24/2012   *RADIOLOGY REPORT*  Clinical Data: Chest pain and shortness of breath tonight.  PORTABLE CHEST - 1 VIEW  Comparison: 01/20/2012  Findings: The heart size and pulmonary vascularity are normal. The lungs appear clear and expanded without focal air space disease or consolidation. No blunting of the costophrenic angles.  No pneumothorax.  Mediastinal contours appear intact.  No significant change since previous study.  IMPRESSION: No evidence of active pulmonary disease.   Original Report Authenticated By: Burman Nieves, [name]     1. Unstable angina   2. Coronary atherosclerosis of native coronary artery      Date: 07/24/2012 2141  Rate: 96  Rhythm: normal sinus rhythm  QRS  Axis: normal  Intervals: normal  ST/T Wave abnormalities: normal  Conduction Disutrbances:none  Narrative Interpretation: Some repolarization abnormality, unchanged  Old EKG Reviewed: unchanged   Date: 07/24/2012 2237  Rate: 76  Rhythm: normal sinus rhythm  QRS Axis: normal  Intervals: normal  ST/T Wave abnormalities: normal  Conduction Disutrbances:none  Narrative Interpretation: Some repolarization abnormality, unchanged  Old EKG Reviewed: unchanged    MDM  Patient with chest pain. He is known cardiac disease but only of the second diagonal. He has not been a candidate for percutaneous intervention. Patient feels much better after sublingual nitroglycerin. He  has not been having a cough. He'll be to internal medicine for further management. Initial enzymes are negative and EKG is stable.        Juliet Rude. Rubin Payor, MD 07/24/12 [id number]

## 2012-07-25 ENCOUNTER — Encounter (HOSPITAL_COMMUNITY): Payer: Self-pay | Admitting: General Practice

## 2012-07-25 DIAGNOSIS — E782 Mixed hyperlipidemia: Secondary | ICD-10-CM

## 2012-07-25 DIAGNOSIS — I10 Essential (primary) hypertension: Secondary | ICD-10-CM

## 2012-07-25 DIAGNOSIS — I2510 Atherosclerotic heart disease of native coronary artery without angina pectoris: Secondary | ICD-10-CM

## 2012-07-25 DIAGNOSIS — I200 Unstable angina: Secondary | ICD-10-CM | POA: Diagnosis present

## 2012-07-25 DIAGNOSIS — R079 Chest pain, unspecified: Secondary | ICD-10-CM

## 2012-07-25 LAB — CBC
HCT: 37.1 % — ABNORMAL LOW (ref 39.0–52.0)
MCH: 31.1 pg (ref 26.0–34.0)
MCV: 84.3 fL (ref 78.0–100.0)
Platelets: 271 10*3/uL (ref 150–400)
RBC: 4.4 MIL/uL (ref 4.22–5.81)

## 2012-07-25 LAB — COMPREHENSIVE METABOLIC PANEL
AST: 23 U/L (ref 0–37)
BUN: 16 mg/dL (ref 6–23)
CO2: 27 mEq/L (ref 19–32)
Calcium: 9 mg/dL (ref 8.4–10.5)
Chloride: 99 mEq/L (ref 96–112)
Creatinine, Ser: 1.18 mg/dL (ref 0.50–1.35)
GFR calc Af Amer: 79 mL/min — ABNORMAL LOW (ref >90)
GFR calc non Af Amer: 68 mL/min — ABNORMAL LOW (ref >90)
Total Bilirubin: 0.2 mg/dL — ABNORMAL LOW (ref 0.3–1.2)

## 2012-07-25 LAB — TSH: TSH: 0.838 u[IU]/mL (ref 0.350–4.500)

## 2012-07-25 LAB — MAGNESIUM: Magnesium: 2.1 mg/dL (ref 1.5–2.5)

## 2012-07-25 LAB — TROPONIN I
Troponin I: <0.3 ng/mL (ref <0.30)
Troponin I: <0.3 ng/mL (ref <0.30)

## 2012-07-25 MED ORDER — TRAZODONE HCL 50 MG PO TABS: 50.0000 mg | ORAL_TABLET | Freq: Every evening | ORAL | Status: DC | PRN

## 2012-07-25 MED ORDER — SODIUM CHLORIDE 0.9 % IJ SOLN
3.0000 mL | Freq: Two times a day (BID) | INTRAMUSCULAR | Status: DC
  Administered 2012-07-25 (×2): 3 mL via INTRAVENOUS
  Filled 2012-07-25: qty 3

## 2012-07-25 MED ORDER — POTASSIUM CHLORIDE 10 MEQ/100ML IV SOLN
10.0000 meq | INTRAVENOUS | Status: AC
  Administered 2012-07-25 (×4): 10 meq via INTRAVENOUS
  Filled 2012-07-25 (×3): qty 100

## 2012-07-25 MED ORDER — ALBUTEROL SULFATE HFA 108 (90 BASE) MCG/ACT IN AERS: 2.0000 | INHALATION_SPRAY | Freq: Four times a day (QID) | RESPIRATORY_TRACT | Status: DC | PRN

## 2012-07-25 MED ORDER — ONDANSETRON HCL 4 MG/2ML IJ SOLN: 4.0000 mg | INTRAMUSCULAR | Status: DC | PRN

## 2012-07-25 MED ORDER — ASPIRIN 81 MG PO CHEW
81.0000 mg | CHEWABLE_TABLET | Freq: Every day | ORAL | Status: DC
  Administered 2012-07-25: 81 mg via ORAL
  Filled 2012-07-25 (×4): qty 1

## 2012-07-25 MED ORDER — NITROGLYCERIN 2 % TD OINT
1.0000 [in_us] | TOPICAL_OINTMENT | Freq: Four times a day (QID) | TRANSDERMAL | Status: DC
  Administered 2012-07-25 (×2): 1 [in_us] via TOPICAL
  Filled 2012-07-25 (×2): qty 1

## 2012-07-25 MED ORDER — MORPHINE SULFATE 2 MG/ML IJ SOLN: 2.0000 mg | INTRAMUSCULAR | Status: DC | PRN

## 2012-07-25 MED ORDER — ENOXAPARIN SODIUM 40 MG/0.4ML ~~LOC~~ SOLN
40.0000 mg | SUBCUTANEOUS | Status: DC
  Administered 2012-07-25: 40 mg via SUBCUTANEOUS
  Filled 2012-07-25: qty 0.4

## 2012-07-25 MED ORDER — SIMVASTATIN 20 MG PO TABS: 40.0000 mg | ORAL_TABLET | Freq: Every day | ORAL | Status: DC

## 2012-07-25 MED ORDER — POTASSIUM CHLORIDE CRYS ER 20 MEQ PO TBCR
40.0000 meq | EXTENDED_RELEASE_TABLET | ORAL | Status: AC
  Administered 2012-07-25 (×3): 40 meq via ORAL
  Filled 2012-07-25 (×3): qty 2

## 2012-07-25 MED ORDER — ACETAMINOPHEN 325 MG PO TABS: 650.0000 mg | ORAL_TABLET | ORAL | Status: DC | PRN

## 2012-07-25 MED ORDER — METOPROLOL TARTRATE 25 MG PO TABS
25.0000 mg | ORAL_TABLET | Freq: Two times a day (BID) | ORAL | Status: DC
  Administered 2012-07-25: 25 mg via ORAL
  Filled 2012-07-25 (×2): qty 1

## 2012-07-25 NOTE — Discharge Summary (Signed)
Physician Discharge Summary  TELFORD ARCHAMBEAU ZOX:[id number] DOB: 05-22-1955 DOA: 07/24/2012  PCP: Catalina Pizza, MD  Admit date: 07/24/2012 Discharge date: 07/25/2012  Time spent: Less than 30 minutes  Recommendations for Outpatient Follow-up:  1. Followup with primary care physician in the next week or so.  Discharge Diagnoses:  1. Chest pain, no evidence of myocardial infarction or ischemia. Possible angina which resolved. 2. Hypokalemia, resolved. 3. Coronary artery disease, stable. 4. Hypertension. 5. Hyperlipidemia.   Discharge Condition: Stable.  Diet recommendation: Heart healthy.  Filed Weights   07/24/12 2143 07/25/12 0050  Weight: 92.987 kg (205 lb) 87.499 kg (192 lb 14.4 oz)    History of present illness:  This 57 year old man presents to the hospital with symptoms of chest pain. Please see initial history as outlined below: HPI:  TRENTAN TRIPPE is a 57 y.o. male. Middle-aged [place] American gentleman with obstructive single vessel coronary artery disease, small branches of the LAD, not amenable to intervention, therefore on medical management, reports 2 hours a central pressing chest pain unclear as to the emergency room and received nitroglycerin which gave him relief.  He did not use nitroglycerin because he was already on a nitroglycerin patch, extended release, and felt that should be adequate.  He reports previous episodes of similar chest pain last December and again in April and feels it is getting more severe each time.  EKG showed no acute changes and cardiac enzymes are normal hospital service was called for overnight evaluation.  He was noted to be hypokalemic  Hospital Course:  The patient was admitted overnight and cardiac enzymes were done which were negative. He was seen by cardiology and cardiology did not feel this was cardiac in origin, possibly related to degree of dehydration. He feels improved now since he has been hospital especially since his  potassium is normalized with repletion. ECG does not show any significant ST-T wave changes. He is now stable for discharge and will need to follow with his primary care physician. He will also keep his appointment with cardiology that he has towards the end of July.  Procedures:  None.  Consultations:  Cardiology, [place].  Discharge Exam: Filed Vitals:   07/25/12 0300 07/25/12 0411 07/25/12 0412 07/25/12 0413  BP: 108/67 117/75 119/81 135/90  Pulse: 69 71 66 73  Temp:  98.4 F (36.9 C) 98.4 F (36.9 C)   TempSrc:  Oral Oral   Resp:  20 20 20   Height:      Weight:      SpO2:  92% 94% 94%    General: He looks systemically well. Cardiovascular: Heart sounds are present without gallop rhythm or murmurs. Respiratory: Lung fields are clear. He is alert and oriented .  Discharge Instructions  Discharge Orders   Future Appointments Provider Department Dept Phone   09/08/2012 1:20 PM Jonelle Sidle, MD [facility] [phone]   Future Orders Complete By Expires     Diet - low sodium heart healthy  As directed     Increase activity slowly  As directed         Medication List    TAKE these medications       albuterol 108 (90 BASE) MCG/ACT inhaler  Commonly known as:  PROVENTIL HFA;VENTOLIN HFA  Inhale 2 puffs into the lungs every 6 (six) hours as needed for wheezing or shortness of breath.     aspirin 81 MG tablet  Take 1 tablet (81 mg total) by mouth daily.  metoprolol tartrate 25 MG tablet  Commonly known as:  LOPRESSOR  Take 1 tablet (25 mg total) by mouth 2 (two) times daily.     nitroGLYCERIN 0.2 mg/hr  Commonly known as:  NITRODUR - Dosed in mg/24 hr  Place 1 patch (0.2 mg total) onto the skin daily.     nitroGLYCERIN 0.4 MG SL tablet  Commonly known as:  NITROSTAT  Place 1 tablet (0.4 mg total) under the tongue every 5 (five) minutes as needed for chest pain.     simvastatin 40 MG tablet  Commonly known as:  ZOCOR  Take 1  tablet (40 mg total) by mouth at bedtime.       No Known Allergies    The results of significant diagnostics from this hospitalization (including imaging, microbiology, ancillary and laboratory) are listed below for reference.    Significant Diagnostic Studies: Dg Chest Portable 1 View  07/24/2012   *RADIOLOGY REPORT*  Clinical Data: Chest pain and shortness of breath tonight.  PORTABLE CHEST - 1 VIEW  Comparison: 01/20/2012  Findings: The heart size and pulmonary vascularity are normal. The lungs appear clear and expanded without focal air space disease or consolidation. No blunting of the costophrenic angles.  No pneumothorax.  Mediastinal contours appear intact.  No significant change since previous study.  IMPRESSION: No evidence of active pulmonary disease.   Original Report Authenticated By: Burman Nieves, [name]        Labs: Basic Metabolic Panel:  Recent Labs Lab 07/24/12 2222 07/25/12 0221 07/25/12 0434  NA 137  --  135  K 2.9*  --  3.8  CL 99  --  99  CO2 27  --  27  GLUCOSE 129*  --  91  BUN 15  --  16  CREATININE 1.24  --  1.18  CALCIUM 9.4  --  9.0  MG  --  2.1  --    Liver Function Tests:  Recent Labs Lab 07/25/12 0434  AST 23  ALT 16  ALKPHOS 87  BILITOT 0.2*  PROT 7.0  ALBUMIN 3.5     CBC:  Recent Labs Lab 07/24/12 2222 07/25/12 0434  WBC 5.9 5.7  NEUTROABS 3.0  --   HGB 13.5 13.7  HCT 37.1* 37.1*  MCV 83.9 84.3  PLT 251 271   Cardiac Enzymes:  Recent Labs Lab 07/24/12 2222 07/25/12 0433 07/25/12 0924  TROPONINI <0.30 <0.30 <0.30   BNP: BNP (last 3 results)  Recent Labs  01/20/12 1015  PROBNP 18.0         Signed:  Evetta Renner C  [facility] 07/25/2012, 10:29 AM

## 2012-07-25 NOTE — Progress Notes (Signed)
PT Cancellation Note  Patient Details Name: Joshua Hall MRN: [id number] DOB: 1956/01/09   Cancelled Treatment:    Reason Eval/Treat Not Completed: Other (comment) We have received a request for PT consult.  Pt was interviewed at bedside and reports having absolutely no mobility problems.  He is totally independent and home and is simply here for his c/o chest pain.  No PT eval is needed.  Please reconsult if MD can clarify why PT is ordered.  Myrlene Broker L 07/25/2012, 9:53 AM

## 2012-07-25 NOTE — Consult Note (Signed)
CARDIOLOGY CONSULT NOTE  Patient ID: Joshua Hall MRN: [id number] DOB/AGE: Aug 30, 1955 57 y.o.  Admit date: 07/24/2012 Referring Physician:Gosrani Primary PhysicianHALL, Joshua Malkin, MD Primary Cardiologist: Joshua Dell MD Reason for Consultation: Unstable Angina  Active Problems:   Mixed hyperlipidemia   Coronary atherosclerosis of native coronary artery   Essential hypertension, benign   Unstable angina  HPI: [name] is a 57 year old patient with known history of CAD, most recent cardiac catheterization December 2013 demonstrating a single vessel coronary artery disease, small branches of the LAD not amenable to intervention. The patient was admitted for ongoing chest discomfort. He states he worked outside all day where he works for the [employer] of [place], and then came home and got his tractor to cut hay. After finishing this he began to have some mild shortness of breath, chest pressure, and became diaphoretic. He presented to the ER.      In the ER evaluation,he was found to be hypokalemic with potassium of 2.9, creatinine 1.24. Troponin was found to be negative x2. Chest x-ray was negative for pulmonary disease. He was treated with nitroglycerin and aspirin. He was given by mouth potassium supplement. With improvement of potassium level this morning is 3.5. He is feeling much better and is anxious to return home.  Review of systems complete and found to be negative unless listed above   Past Medical History  Diagnosis Date  . Colitis   . Coronary atherosclerosis of native coronary artery     Largely nonobstructive in major epicardial was with 99% small diagonal stenosis - medical therapy    Family History  Problem Relation Age of Onset  . Heart disease Brother     Died of MI at 49  . Diabetes Mother     History   Social History  . Marital Status: Divorced    Spouse Name: N/A    Number of Children: N/A  . Years of Education: N/A   Occupational History  . Not on  file.   Social History Main Topics  . Smoking status: Current Every Day Smoker -- 0.25 packs/day for 40 years    Types: Cigarettes  . Smokeless tobacco: Never Used  . Alcohol Use: Yes     Comment: Occasional  . Drug Use: No  . Sexually Active: Yes   Other Topics Concern  . Not on file   Social History Narrative  . No narrative on file    Past Surgical History  Procedure Laterality Date  . Mouth surgery    . Cardiac catheterization  10/2011    [facility]     Prescriptions prior to admission  Medication Sig Dispense Refill  . aspirin 81 MG tablet Take 1 tablet (81 mg total) by mouth daily.  30 tablet    . metoprolol tartrate (LOPRESSOR) 25 MG tablet Take 1 tablet (25 mg total) by mouth 2 (two) times daily.  60 tablet  11  . nitroGLYCERIN (NITRODUR - DOSED IN MG/24 HR) 0.2 mg/hr Place 1 patch (0.2 mg total) onto the skin daily.  30 patch  11  . nitroGLYCERIN (NITROSTAT) 0.4 MG SL tablet Place 1 tablet (0.4 mg total) under the tongue every 5 (five) minutes as needed for chest pain.  25 tablet  3  . simvastatin (ZOCOR) 40 MG tablet Take 1 tablet (40 mg total) by mouth at bedtime.  30 tablet  11  . albuterol (PROVENTIL HFA;VENTOLIN HFA) 108 (90 BASE) MCG/ACT inhaler Inhale 2 puffs into the lungs every 6 (six) hours  as needed for wheezing or shortness of breath.  1 Inhaler  0    Cardiac Cath 10/18/2011 Coronary angiography:  Coronary dominance: right  Left mainstem: Normal.  Left anterior descending (LAD): There is 30% stenosis in the proximal and mid LAD. The second diagonal is a very small branch. It is only 1 mm in diameter. It has a 99% stenosis proximally.  There is a large ramus intermediate branch which is normal.  Left circumflex (LCx): Normal.  Right coronary artery (RCA): Mild nonobstructive disease in the mid and distal vessel up to 10-20%.  Left ventriculography: Left ventricular systolic function is normal, LVEF is estimated at 55-65%, there is no significant  mitral regurgitation  Final Conclusions:  1. Single vessel obstructive coronary disease involving a very tiny diagonal branch. Otherwise nonobstructive disease. The diagonal branch is too small for intervention.  2. Normal left ventricular function.  Recommendations: Aggressive medical therapy and risk factor modification.   Physical Exam: Blood pressure 135/90, pulse 73, temperature 98.4 F (36.9 C), temperature source Oral, resp. rate 20, height 6' (1.829 m), weight 192 lb 14.4 oz (87.499 kg), SpO2 94.00%.   General: Well developed, well nourished, in no acute distress Head: Eyes PERRLA, No xanthomas.   Normal cephalic and atramatic  Lungs: Clear bilaterally to auscultation and percussion. Heart: HRRR S1 S2, without MRG.  Pulses are 2+ & equal.            No carotid bruit. No JVD.  No abdominal bruits. No femoral bruits. Abdomen: Bowel sounds are positive, abdomen soft and non-tender without masses or   Hernia's noted. Msk:  Back normal, normal gait. Normal strength and tone for age. Extremities: No clubbing, cyanosis or edema.  DP +1 Neuro: Alert and oriented X 3. Psych:  Good affect, responds appropriately  Labs:   Lab Results  Component Value Date   WBC 5.7 07/25/2012   HGB 13.7 07/25/2012   HCT 37.1* 07/25/2012   MCV 84.3 07/25/2012   PLT 271 07/25/2012    Recent Labs Lab 07/25/12 0434  NA 135  K 3.8  CL 99  CO2 27  BUN 16  CREATININE 1.18  CALCIUM 9.0  PROT 7.0  BILITOT 0.2*  ALKPHOS 87  ALT 16  AST 23  GLUCOSE 91   Lab Results  Component Value Date   CKTOTAL 164 10/18/2011   CKMB 2.5 10/18/2011   TROPONINI <0.30 07/25/2012    Lab Results  Component Value Date   CHOL 191 10/19/2011   CHOL  Value: 162        ATP III CLASSIFICATION:  <200     mg/dL   Desirable  161-096  mg/dL   Borderline High  >=045    mg/dL   High        05/21/8117    BNP (last 3 results)  Recent Labs  01/20/12 1015  PROBNP 18.0      Radiology: Dg Chest Portable 1 View  07/24/2012    *RADIOLOGY REPORT*  Clinical Data: Chest pain and shortness of breath tonight.  PORTABLE CHEST - 1 VIEW  Comparison: 01/20/2012  Findings: The heart size and pulmonary vascularity are normal. The lungs appear clear and expanded without focal air space disease or consolidation. No blunting of the costophrenic angles.  No pneumothorax.  Mediastinal contours appear intact.  No significant change since previous study.  IMPRESSION: No evidence of active pulmonary disease.   Original Report Authenticated By: Burman Nieves, [name]   EKG: NSR with T-wave inversion in  the anterior/septal leads.  ASSESSMENT AND PLAN:   1. Angina; Doubt this was cardiac in etiology as the patient was very hypokalemic on admission with potassium of 2.9. After repletion of potassium the patient has had resolution of symptoms. The patient should continue on his current medication regimen to include nitrate patch, beta blocker, and ACE inhibitor. He will remain on aspirin as well. He has been advised to continue to be well-hydrated while he is working outside with recommendation to drink Gatorade or Powerade during the day x1 for potassium replacement. He is  not on any medications that deplete potassium on my review of his home doses. Appears to be stable from a cardiac standpoint and can possibly be discharged today at the discretion of primary care.  2. CAD: As in cardiac catheterization 2013 demonstrated single vessel disease and small LAD branch tone minimal to intervention. The patient was treated with nitrates and continued on medical management. No plans to change this regimen. He will continue followups with Dr. Diona Browner in the office as scheduled.  3. Hypercholesterolemia: Continue on statin replacement with ongoing labs and risk management.   Signed: Bettey Mare. Lyman Bishop NP [facility] 07/25/2012, 9:35 AM Co-Sign MD Patient seen and examined.  Joshua Hall with findings as noted by Harriet Pho above  I have amended  physical and history to reflect my findings. I do not think episode of chest pain reflects angina/ischemia.  Patient has been working outside  [employer] on admit reflect some volume depletion and hypokalemia.  Symptoms have resolved with repletion Would continue preadmit meds.  OK to resume work  Encouraged him to stay hydrated.  Best with Gatorade type drinks  Dietrich Pates

## 2012-07-25 NOTE — H&P (Signed)
[facility] History and Physical  Joshua Hall  ZOX:[id number]  DOB: 1955/12/15   DOA: 07/24/2012   PCP:   Catalina Pizza, MD   Chief Complaint:  Chest pain  Wears ntg patch; CP x 2 hrs until got NTG in Ed  previous episode in December and April;  HPI: Joshua Hall is a 57 y.o. male.  Middle-aged [place] American gentleman with obstructive single vessel coronary artery disease,  small branches of the LAD, not amenable to intervention, therefore on medical management, reports 2 hours a central pressing chest pain unclear as to the emergency room and received nitroglycerin which gave him relief. He did not use nitroglycerin because he was already on a nitroglycerin patch, extended release, and felt that should be adequate.  He reports previous episodes of similar chest pain last December and again in April and feels it is getting more severe each time.  EKG showed no acute changes and cardiac enzymes are normal hospital service was called for overnight evaluation. He was noted to be hypokalemic   Rewiew of Systems:  Because redness of the patient is extremely drowsy, but repeatedly falls asleep during the interview but appears to deny any symptomatology  All systems negative except as marked bold or noted in the HPI;  Constitutional:    malaise, fever and chills. ;  Eyes:   eye pain, redness and discharge. ;  ENMT:   ear pain, hoarseness, nasal congestion, sinus pressure and sore throat. ;   Respiratory:   cough, hemoptysis, wheezing and stridor. ;  Gastrointestinal:  nausea, vomiting, diarrhea, constipation, abdominal pain, melena, blood in stool, hematemesis, jaundice and rectal bleeding. unusual weight loss..   Genitourinary:    frequency, dysuria, incontinence,flank pain and hematuria; Musculoskeletal:   back pain and neck pain.  swelling and trauma.;  Skin: .  pruritus, rash, abrasions, bruising and skin lesion.; ulcerations Neuro:    headache, lightheadedness and neck  stiffness.  weakness, altered level of consciousness, altered mental status, extremity weakness, burning feet, involuntary movement, seizure and syncope.  Psych:    anxiety, depression, insomnia, tearfulness, panic attacks, hallucinations, paranoia, suicidal or homicidal ideation    Past Medical History  Diagnosis Date  . Colitis   . Coronary atherosclerosis of native coronary artery     Largely nonobstructive in major epicardial was with 99% small diagonal stenosis - medical therapy    Past Surgical History  Procedure Laterality Date  . Mouth surgery    . Cardiac catheterization  10/2011    [place] [facility] [place]    Medications:  HOME MEDS: Prior to Admission medications   Medication Sig Start Date End Date Taking? Authorizing Provider  aspirin 81 MG tablet Take 1 tablet (81 mg total) by mouth daily. 10/20/11 10/19/12 Yes Rhonda G Barrett, PA-C  metoprolol tartrate (LOPRESSOR) 25 MG tablet Take 1 tablet (25 mg total) by mouth 2 (two) times daily. 10/20/11 10/19/12 Yes Rhonda G Barrett, PA-C  nitroGLYCERIN (NITRODUR - DOSED IN MG/24 HR) 0.2 mg/hr Place 1 patch (0.2 mg total) onto the skin daily. 10/20/11 10/19/12 Yes Rhonda G Barrett, PA-C  nitroGLYCERIN (NITROSTAT) 0.4 MG SL tablet Place 1 tablet (0.4 mg total) under the tongue every 5 (five) minutes as needed for chest pain. 10/20/11 10/19/12 Yes Rhonda G Barrett, PA-C  simvastatin (ZOCOR) 40 MG tablet Take 1 tablet (40 mg total) by mouth at bedtime. 10/20/11 10/19/12 Yes Rhonda G Barrett, PA-C  albuterol (PROVENTIL HFA;VENTOLIN HFA) 108 (90 BASE) MCG/ACT inhaler Inhale 2 puffs into the  lungs every 6 (six) hours as needed for wheezing or shortness of breath. 01/21/12   Erick Blinks, MD     Allergies:  No Known Allergies  Social History:   reports that he has been smoking Cigarettes.  He has a 10 pack-year smoking history. He has never used smokeless tobacco. He reports that  drinks alcohol. He reports that he does not use illicit drugs.  Family  History: Family History  Problem Relation Age of Onset  . Heart disease Brother     Died of MI at 64  . Diabetes Mother      Physical Exam: Filed Vitals:   07/24/12 2143 07/24/12 2300 07/24/12 2312 07/25/12 0050  BP: 122/71  100/58 107/64  Pulse:  82  63  Temp: 98.3 F (36.8 C)   98.4 F (36.9 C)  TempSrc: Oral   Oral  Resp: 20 21  20   Height: 6' (1.829 m)   6' (1.829 m)  Weight: 92.987 kg (205 lb)   87.499 kg (192 lb 14.4 oz)  SpO2: 96% 97%  94%   Blood pressure 107/64, pulse 63, temperature 98.4 F (36.9 C), temperature source Oral, resp. rate 20, height 6' (1.829 m), weight 87.499 kg (192 lb 14.4 oz), SpO2 94.00%.  GEN:  Pleasant drowsy middle-aged African American gentleman lying bed in no acute distress; cooperative with exam HEENT: Mucous membranes pink and anicteric; PERRLA; EOM intact; no cervical lymphadenopathy nor thyromegaly or carotid bruit; no JVD; Breasts:: Not examined CHEST WALL: Mild sternal tenderness CHEST: Normal respiration, clear to auscultation bilaterally HEART: Regular rate and rhythm; no murmurs rubs or gallops ABDOMEN: Obese, soft non-tender; no masses, no organomegaly, normal abdominal bowel sounds; Rectal Exam: Not done EXTREMITIES:  age-appropriate arthropathy of the hands and knees; no edema; no ulcerations. Genitalia: not examined PULSES: 2+ and symmetric SKIN: Normal hydration no rash or ulceration CNS: Cranial nerves 2-12 grossly intact no focal lateralizing neurologic deficit   Labs on Admission:  Basic Metabolic Panel:  Recent Labs Lab 07/24/12 2222  NA 137  K 2.9*  CL 99  CO2 27  GLUCOSE 129*  BUN 15  CREATININE 1.24  CALCIUM 9.4   Liver Function Tests: No results found for this basename: AST, ALT, ALKPHOS, BILITOT, PROT, ALBUMIN,  in the last 168 hours No results found for this basename: LIPASE, AMYLASE,  in the last 168 hours No results found for this basename: AMMONIA,  in the last 168 hours CBC:  Recent Labs Lab  07/24/12 2222  WBC 5.9  NEUTROABS 3.0  HGB 13.5  HCT 37.1*  MCV 83.9  PLT 251   Cardiac Enzymes:  Recent Labs Lab 07/24/12 2222  TROPONINI <0.30   BNP: No components found with this basename: POCBNP,  D-dimer: No components found with this basename: D-DIMER,  CBG: No results found for this basename: GLUCAP,  in the last 168 hours  Radiological Exams on Admission: Dg Chest Portable 1 View  07/24/2012   *RADIOLOGY REPORT*  Clinical Data: Chest pain and shortness of breath tonight.  PORTABLE CHEST - 1 VIEW  Comparison: 01/20/2012  Findings: The heart size and pulmonary vascularity are normal. The lungs appear clear and expanded without focal air space disease or consolidation. No blunting of the costophrenic angles.  No pneumothorax.  Mediastinal contours appear intact.  No significant change since previous study.  IMPRESSION: No evidence of active pulmonary disease.   Original Report Authenticated By: Burman Nieves, [name]     Assessment/Plan   Active Problems:  Mixed hyperlipidemia   Coronary atherosclerosis of native coronary artery   Essential hypertension, benign   Unstable angina   PLAN:  bring this gentleman in on observation cardiac rule out; Apply nitroglycerin paste to chest  Cardiology consult in the morning Lipid panel in the morning  Other plans as per orders.  Code Status:  full code  Family Communication: Plans discuss with patient  bedside Disposition Plan: Pending cardiology evaluation    Adolpho Meenach Nocturnist [facility] Pager [phone]   07/25/2012, 1:36 AM

## 2012-07-25 NOTE — Progress Notes (Signed)
Patient given d/c instructions and verbalizes understanding. IV cath removed. No pain/swelling at site.

## 2012-07-25 NOTE — Progress Notes (Signed)
Utilization Review Complete  

## 2012-09-08 ENCOUNTER — Ambulatory Visit (INDEPENDENT_AMBULATORY_CARE_PROVIDER_SITE_OTHER): Payer: PRIVATE HEALTH INSURANCE | Admitting: Cardiology

## 2012-09-08 ENCOUNTER — Encounter: Payer: Self-pay | Admitting: Cardiology

## 2012-09-08 VITALS — BP 145/89 | HR 78 | Ht 72.0 in | Wt 201.0 lb

## 2012-09-08 DIAGNOSIS — I2510 Atherosclerotic heart disease of native coronary artery without angina pectoris: Secondary | ICD-10-CM

## 2012-09-08 DIAGNOSIS — E782 Mixed hyperlipidemia: Secondary | ICD-10-CM

## 2012-09-08 DIAGNOSIS — I10 Essential (primary) hypertension: Secondary | ICD-10-CM

## 2012-09-08 MED ORDER — SIMVASTATIN 40 MG PO TABS: 40.0000 mg | ORAL_TABLET | Freq: Every day | ORAL | Status: DC

## 2012-09-08 NOTE — Assessment & Plan Note (Signed)
Resume Zocor 40 mg daily, followup FLP and LFT in 3 months.

## 2012-09-08 NOTE — Assessment & Plan Note (Signed)
Clinically stable, continue medical therapy. No changes made today.

## 2012-09-08 NOTE — Progress Notes (Signed)
   Clinical Summary Mr. Thomason is a 57 y.o.male last seen in January. Record review finds hospital admission in June with chest pain, ruled out for myocardial infarction, was seen in consultation by Dr. Tenny Craw. Patient had some evidence of volume depletion and hypokalemia at that time. No further cardiac testing was arranged.  He reports no regular chest pain symptoms, still works for full time for the city of [place]. States that he has been compliant with his medications except that he ran out of Zocor. Has not been on this medicine for approximately one month. A recent lipid panel.   No Known Allergies  Current Outpatient Prescriptions  Medication Sig Dispense Refill  . albuterol (PROVENTIL HFA;VENTOLIN HFA) 108 (90 BASE) MCG/ACT inhaler Inhale 2 puffs into the lungs every 6 (six) hours as needed for wheezing or shortness of breath.  1 Inhaler  0  . aspirin 81 MG tablet Take 1 tablet (81 mg total) by mouth daily.  30 tablet    . metoprolol tartrate (LOPRESSOR) 25 MG tablet Take 1 tablet (25 mg total) by mouth 2 (two) times daily.  60 tablet  11  . nitroGLYCERIN (NITRODUR - DOSED IN MG/24 HR) 0.2 mg/hr Place 1 patch (0.2 mg total) onto the skin daily.  30 patch  11  . nitroGLYCERIN (NITROSTAT) 0.4 MG SL tablet Place 1 tablet (0.4 mg total) under the tongue every 5 (five) minutes as needed for chest pain.  25 tablet  3  . simvastatin (ZOCOR) 40 MG tablet Take 1 tablet (40 mg total) by mouth at bedtime.  30 tablet  11   No current facility-administered medications for this visit.    Past Medical History  Diagnosis Date  . Colitis   . Coronary atherosclerosis of native coronary artery     Largely nonobstructive in major epicardial was with 99% small diagonal stenosis - medical therapy    Social History Mr. Richart reports that he has been smoking Cigarettes.  He has a 10 pack-year smoking history. He has never used smokeless tobacco. Mr. Quintela reports that  drinks  alcohol.  Review of Systems No palpitations, dizziness, bleeding problems, orthopnea or PND. Otherwise negative  Physical Examination Filed Vitals:   09/08/12 1318  BP: 145/89  Pulse: 78   Filed Weights   09/08/12 1318  Weight: 201 lb (91.173 kg)    Appears comfortable at rest. HEENT: Conjunctiva and lids normal, oropharynx clear with moist mucosa.  Neck: Supple, no elevated JVP or carotid bruits, no thyromegaly.  Lungs: Clear to auscultation, nonlabored breathing at rest.  Cardiac: Regular rate and rhythm, no S3, 2/6 systolic murmur, no pericardial rub.  Abdomen: Soft, nontender, bowel sounds present, no guarding or rebound.  Extremities: No pitting edema, distal pulses 2+.   Problem List and Plan   Coronary atherosclerosis of native coronary artery Clinically stable, continue medical therapy. No changes made today.  Mixed hyperlipidemia Resume Zocor 40 mg daily, followup FLP and LFT in 3 months.  Essential hypertension, benign Blood pressure mildly elevated today. Keep follow up with Dr. Margo Aye.    Jonelle Sidle, [name], F.A.C.C.

## 2012-09-08 NOTE — Assessment & Plan Note (Signed)
Blood pressure mildly elevated today. Keep follow up with Dr. Hall. 

## 2012-09-08 NOTE — Patient Instructions (Addendum)
Your physician recommends that you schedule a follow-up appointment in: 6 MONTHS WITH SM  Your physician recommends that you return for lab work in: 3 MONTHS Your physician recommends that you have follow up lab work, we will mail you a reminder letter to alert you when to go [employer], located across the street from our office.

## 2012-11-28 ENCOUNTER — Other Ambulatory Visit (HOSPITAL_COMMUNITY): Payer: Self-pay | Admitting: Physician Assistant

## 2012-12-05 ENCOUNTER — Other Ambulatory Visit: Payer: Self-pay | Admitting: *Deleted

## 2012-12-05 ENCOUNTER — Encounter: Payer: Self-pay | Admitting: *Deleted

## 2012-12-05 DIAGNOSIS — E782 Mixed hyperlipidemia: Secondary | ICD-10-CM

## 2013-04-04 ENCOUNTER — Encounter (HOSPITAL_COMMUNITY): Payer: Self-pay | Admitting: Emergency Medicine

## 2013-04-04 ENCOUNTER — Emergency Department (HOSPITAL_COMMUNITY): Payer: PRIVATE HEALTH INSURANCE

## 2013-04-04 ENCOUNTER — Emergency Department (HOSPITAL_COMMUNITY)
Admission: EM | Admit: 2013-04-04 | Discharge: 2013-04-04 | Disposition: A | Discharge status: Home / Self Care | Payer: PRIVATE HEALTH INSURANCE | Attending: Emergency Medicine | Admitting: Emergency Medicine

## 2013-04-04 ENCOUNTER — Other Ambulatory Visit: Payer: Self-pay

## 2013-04-04 DIAGNOSIS — Z79899 Other long term (current) drug therapy: Secondary | ICD-10-CM | POA: Insufficient documentation

## 2013-04-04 DIAGNOSIS — R072 Precordial pain: Secondary | ICD-10-CM | POA: Insufficient documentation

## 2013-04-04 DIAGNOSIS — I2510 Atherosclerotic heart disease of native coronary artery without angina pectoris: Secondary | ICD-10-CM | POA: Insufficient documentation

## 2013-04-04 DIAGNOSIS — Z8719 Personal history of other diseases of the digestive system: Secondary | ICD-10-CM | POA: Insufficient documentation

## 2013-04-04 DIAGNOSIS — R079 Chest pain, unspecified: Secondary | ICD-10-CM

## 2013-04-04 DIAGNOSIS — E7800 Pure hypercholesterolemia, unspecified: Secondary | ICD-10-CM | POA: Insufficient documentation

## 2013-04-04 DIAGNOSIS — Z9889 Other specified postprocedural states: Secondary | ICD-10-CM | POA: Insufficient documentation

## 2013-04-04 DIAGNOSIS — I10 Essential (primary) hypertension: Secondary | ICD-10-CM | POA: Insufficient documentation

## 2013-04-04 DIAGNOSIS — F17200 Nicotine dependence, unspecified, uncomplicated: Secondary | ICD-10-CM | POA: Insufficient documentation

## 2013-04-04 LAB — BASIC METABOLIC PANEL
BUN: 12 mg/dL (ref 6–23)
CALCIUM: 9.3 mg/dL (ref 8.4–10.5)
CO2: 25 mEq/L (ref 19–32)
Chloride: 98 mEq/L (ref 96–112)
Creatinine, Ser: 1.01 mg/dL (ref 0.50–1.35)
GFR calc Af Amer: >90 mL/min (ref >90)
GFR, EST NON AFRICAN AMERICAN: 82 mL/min — AB (ref >90)
GLUCOSE: 111 mg/dL — AB (ref 70–99)
POTASSIUM: 4 meq/L (ref 3.7–5.3)
Sodium: 136 mEq/L — ABNORMAL LOW (ref 137–147)

## 2013-04-04 LAB — TROPONIN I: Troponin I: <0.3 ng/mL (ref <0.30)

## 2013-04-04 LAB — CBC WITH DIFFERENTIAL/PLATELET
Basophils Absolute: 0 10*3/uL (ref 0.0–0.1)
Basophils Relative: 0 % (ref 0–1)
EOS ABS: 0 10*3/uL (ref 0.0–0.7)
EOS PCT: 0 % (ref 0–5)
HEMATOCRIT: 38.2 % — AB (ref 39.0–52.0)
HEMOGLOBIN: 14.1 g/dL (ref 13.0–17.0)
LYMPHS ABS: 1.7 10*3/uL (ref 0.7–4.0)
Lymphocytes Relative: 31 % (ref 12–46)
MCH: 31.5 pg (ref 26.0–34.0)
MCHC: 36.9 g/dL — ABNORMAL HIGH (ref 30.0–36.0)
MCV: 85.3 fL (ref 78.0–100.0)
MONO ABS: 0.4 10*3/uL (ref 0.1–1.0)
MONOS PCT: 7 % (ref 3–12)
Neutro Abs: 3.3 10*3/uL (ref 1.7–7.7)
Neutrophils Relative %: 61 % (ref 43–77)
Platelets: 255 10*3/uL (ref 150–400)
RBC: 4.48 MIL/uL (ref 4.22–5.81)
RDW: 14.2 % (ref 11.5–15.5)
WBC: 5.3 10*3/uL (ref 4.0–10.5)

## 2013-04-04 MED ORDER — ASPIRIN 81 MG PO CHEW
324.0000 mg | CHEWABLE_TABLET | Freq: Once | ORAL | Status: AC
Start: 2013-04-04 — End: 2013-04-04
  Administered 2013-04-04: 324 mg via ORAL
  Filled 2013-04-04: qty 4

## 2013-04-04 MED ORDER — HYDROCODONE-ACETAMINOPHEN 5-325 MG PO TABS: 2.0000 | ORAL_TABLET | ORAL | Status: DC | PRN

## 2013-04-04 MED ORDER — NITROGLYCERIN 0.4 MG SL SUBL
0.4000 mg | SUBLINGUAL_TABLET | SUBLINGUAL | Status: AC | PRN
  Administered 2013-04-04 (×3): 0.4 mg via SUBLINGUAL
  Filled 2013-04-04: qty 25

## 2013-04-04 MED ORDER — KETOROLAC TROMETHAMINE 30 MG/ML IJ SOLN
30.0000 mg | Freq: Once | INTRAMUSCULAR | Status: AC
  Administered 2013-04-04: 30 mg via INTRAVENOUS
  Filled 2013-04-04: qty 1

## 2013-04-04 NOTE — Discharge Instructions (Signed)
Ibuprofen 600 mg 3 times daily for the next 3 days.  Hydrocodone as needed for pain not relieved with ibuprofen.  Return to the emergency department if you develop worsening pain, difficulty breathing, or other new or concerning symptoms.  Followup with your primary Dr. in the next week.   Chest Pain (Nonspecific) It is often hard to give a specific diagnosis for the cause of chest pain. There is always a chance that your pain could be related to something serious, such as a heart attack or a blood clot in the lungs. You need to follow up with your caregiver for further evaluation. CAUSES   Heartburn.  Pneumonia or bronchitis.  Anxiety or stress.  Inflammation around your heart (pericarditis) or lung (pleuritis or pleurisy).  A blood clot in the lung.  A collapsed lung (pneumothorax). It can develop suddenly on its own (spontaneous pneumothorax) or from injury (trauma) to the chest.  Shingles infection (herpes zoster virus). The chest wall is composed of bones, muscles, and cartilage. Any of these can be the source of the pain.  The bones can be bruised by injury.  The muscles or cartilage can be strained by coughing or overwork.  The cartilage can be affected by inflammation and become sore (costochondritis). DIAGNOSIS  Lab tests or other studies, such as X-rays, electrocardiography, stress testing, or cardiac imaging, may be needed to find the cause of your pain.  TREATMENT   Treatment depends on what may be causing your chest pain. Treatment may include:  Acid blockers for heartburn.  Anti-inflammatory medicine.  Pain medicine for inflammatory conditions.  Antibiotics if an infection is present.  You may be advised to change lifestyle habits. This includes stopping smoking and avoiding alcohol, caffeine, and chocolate.  You may be advised to keep your head raised (elevated) when sleeping. This reduces the chance of acid going backward from your stomach into your  esophagus.  Most of the time, nonspecific chest pain will improve within 2 to 3 days with rest and mild pain medicine. HOME CARE INSTRUCTIONS   If antibiotics were prescribed, take your antibiotics as directed. Finish them even if you start to feel better.  For the next few days, avoid physical activities that bring on chest pain. Continue physical activities as directed.  Do not smoke.  Avoid drinking alcohol.  Only take over-the-counter or prescription medicine for pain, discomfort, or fever as directed by your caregiver.  Follow your caregiver's suggestions for further testing if your chest pain does not go away.  Keep any follow-up appointments you made. If you do not go to an appointment, you could develop lasting (chronic) problems with pain. If there is any problem keeping an appointment, you must call to reschedule. SEEK MEDICAL CARE IF:   You think you are having problems from the medicine you are taking. Read your medicine instructions carefully.  Your chest pain does not go away, even after treatment.  You develop a rash with blisters on your chest. SEEK IMMEDIATE MEDICAL CARE IF:   You have increased chest pain or pain that spreads to your arm, neck, jaw, back, or abdomen.  You develop shortness of breath, an increasing cough, or you are coughing up blood.  You have severe back or abdominal pain, feel nauseous, or vomit.  You develop severe weakness, fainting, or chills.  You have a fever. THIS IS AN EMERGENCY. Do not wait to see if the pain will go away. Get medical help at once. Call your local emergency services (  911 in [place] Do not drive yourself to the hospital. MAKE SURE YOU:   Understand these instructions.  Will watch your condition.  Will get help right away if you are not doing well or get worse. Document Released: 11/08/2004 Document Revised: 04/23/2011 Document Reviewed: 09/04/2007 [facility] Patient Information 2014 [place].

## 2013-04-04 NOTE — ED Notes (Signed)
Pt woke this morning to use the restroom & started having chest pains. Pt also states numbness to the left arm

## 2013-04-04 NOTE — ED Provider Notes (Addendum)
CSN: [id number]     Arrival date & time 04/04/13  0411 History   First MD Initiated Contact with Patient 04/04/13 0423     Chief Complaint  Patient presents with  . Chest Pain     (Consider location/radiation/quality/duration/timing/severity/associated sxs/prior Treatment) HPI Comments: 58-year-old male with history of hypertension and hypercholesterolemia. He presents today with complaints of chest discomfort. States that he started a heaviness in his chest when he got up to go to the bathroom approximately one hour prior to presentation. He went to bed feeling well denies any injury or trauma. He denies any recent exertional symptoms. He had a heart cath performed in September of 2013 which involved a very tiny diagonal branch showing obstructive coronary disease but was otherwise unremarkable.  Patient is a 58 y.o. male presenting with chest pain. The history is provided by the patient.  Chest Pain Pain location:  Substernal area Pain quality comment:  Heaviness Pain radiates to:  L shoulder Pain radiates to the back: no   Pain severity:  Moderate Onset quality:  Sudden Duration:  1 hour Timing:  Constant Progression:  Unchanged Chronicity:  New Context: not breathing   Relieved by:  Nothing Worsened by:  Nothing tried Ineffective treatments:  None tried Associated symptoms: no fever and no shortness of breath     Past Medical History  Diagnosis Date  . Colitis   . Coronary atherosclerosis of native coronary artery     Largely nonobstructive in major epicardial was with 99% small diagonal stenosis - medical therapy   Past Surgical History  Procedure Laterality Date  . Mouth surgery    . Cardiac catheterization  10/2011    [place] [facility] [place]   Family History  Problem Relation Age of Onset  . Heart disease Brother     Died of MI at 33  . Diabetes Mother    History  Substance Use Topics  . Smoking status: Current Every Day Smoker -- 0.25 packs/day for 40 years   Types: Cigarettes  . Smokeless tobacco: Never Used     Comment: smokes 7 ciggerettes daily for the past 2 months  . Alcohol Use: Yes     Comment: Occasional    Review of Systems  Constitutional: Negative for fever.  Respiratory: Negative for shortness of breath.   Cardiovascular: Positive for chest pain.  All other systems reviewed and are negative.      Allergies  Review of patient's allergies indicates no known allergies.  Home Medications   Current Outpatient Rx  Name  Route  Sig  Dispense  Refill  . metoprolol tartrate (LOPRESSOR) 25 MG tablet   Oral   Take 1 tablet (25 mg total) by mouth 2 (two) times daily.   60 tablet   11   . nitroGLYCERIN (NITRODUR - DOSED IN MG/24 HR) 0.2 mg/hr patch      PLACE ONE PATCH ONTO THE SKIN DAILY   30 patch   0   . nitroGLYCERIN (NITROSTAT) 0.4 MG SL tablet   Sublingual   Place 1 tablet (0.4 mg total) under the tongue every 5 (five) minutes as needed for chest pain.   25 tablet   3   . simvastatin (ZOCOR) 40 MG tablet   Oral   Take 1 tablet (40 mg total) by mouth at bedtime.   30 tablet   11   . albuterol (PROVENTIL HFA;VENTOLIN HFA) 108 (90 BASE) MCG/ACT inhaler   Inhalation   Inhale 2 puffs into the lungs every 6 (  six) hours as needed for wheezing or shortness of breath.   1 Inhaler   0    BP 154/99  Pulse 92  Temp(Src) 98.3 F (36.8 C) (Oral)  Ht 6\' 6"  (1.981 m)  Wt 201 lb (91.173 kg)  BMI 23.23 kg/m2  SpO2 97% Physical Exam  Nursing note and vitals reviewed. Constitutional: He is oriented to person, place, and time. He appears well-developed and well-nourished. No distress.  HENT:  Head: Normocephalic and atraumatic.  Mouth/Throat: Oropharynx is clear and moist.  Neck: Normal range of motion. Neck supple.  Cardiovascular: Normal rate, regular rhythm and normal heart sounds.   No murmur heard. Pulmonary/Chest: Effort normal and breath sounds normal. No respiratory distress. He has no wheezes. He has no  rales. He exhibits tenderness.  There is tenderness to palpation to the lower sternum.  Abdominal: Soft. Bowel sounds are normal. He exhibits no distension and no mass. There is no tenderness. There is no rebound and no guarding.  Musculoskeletal: Normal range of motion. He exhibits no edema and no tenderness.  Neurological: He is alert and oriented to person, place, and time.  Skin: He is not diaphoretic.    ED Course  Procedures (including critical care time) Labs Review Labs Reviewed  CBC WITH DIFFERENTIAL  BASIC METABOLIC PANEL  TROPONIN I   Imaging Review No results found.   Date: 04/04/2013  Rate: 86  Rhythm: normal sinus rhythm  QRS Axis: normal  Intervals: normal  ST/T Wave abnormalities: normal  Conduction Disutrbances:none  Narrative Interpretation:   Old EKG Reviewed: unchanged   Date: 04/04/2013  Rate: 66  Rhythm: normal sinus rhythm  QRS Axis: normal  Intervals: normal  ST/T Wave abnormalities: normal  Conduction Disutrbances:none  Narrative Interpretation:   Old EKG Reviewed: unchanged    MDM   Final diagnoses:  None    Patient is a 58 year old male who presents with complaints of chest discomfort that started when he woke up in the night to go to the bathroom. His pain is sharp in nature and is reproducible with palpation of the chest wall. His initial EKG shows a sinus rhythm with no acute changes. His initial troponin is negative and repeat troponin is negative as well his EKG was repeated and shows no acute abnormality. His symptoms, exam, and workup are not consistent with a cardiac etiology. He also had a heart cath performed in September of 2013 which revealed no significant blockages with the exception of a very small second diagonal branch with a lesion that was not amenable to stenting. He appears stable for discharge. I will recommend ibuprofen and prescribe a small number of hydrocodone today for his discomfort. He is to return if his symptoms  substantially worsen or change.    Veryl Speak, MD 04/04/13 3299  Veryl Speak, MD 04/04/13 [phone]

## 2013-04-04 NOTE — ED Notes (Signed)
Expressed concerns about going home and having to come back, Dr Regenia Skeeter talked with patient and answered concerns. Patient agreed to go home at this time. Advised to return if necessary

## 2013-04-04 NOTE — ED Provider Notes (Signed)
Asked by nurse to see patient due to patient having concerns for going home. Patient stating he still having some chest pain is worried that if he goes home health to come right back. Patient is well-appearing and in no distress. I discussed the workup is been done by Dr. Stark Jock and that it seems the emergent/life-threatening diagnoses have been ruled out including acute MI. Patient seems relieved by this and when I discussed that he posterior likely need more workup as an outpatient with his doctor he agreed to followup closely and will return if there any other worrisome signs or symptoms get worse. Patient understands strict return precautions such as worsening chest pain, shortness of breath, or other concerning symptoms.  Ephraim Hamburger, MD 04/04/13 [phone]

## 2013-11-03 ENCOUNTER — Observation Stay (HOSPITAL_COMMUNITY)
Admission: EM | Admit: 2013-11-03 | Discharge: 2013-11-04 | Disposition: A | Discharge status: Home / Self Care | Payer: PRIVATE HEALTH INSURANCE | Attending: Internal Medicine | Admitting: Internal Medicine

## 2013-11-03 ENCOUNTER — Encounter (HOSPITAL_COMMUNITY): Payer: Self-pay | Admitting: Emergency Medicine

## 2013-11-03 ENCOUNTER — Observation Stay (HOSPITAL_COMMUNITY): Payer: PRIVATE HEALTH INSURANCE

## 2013-11-03 ENCOUNTER — Emergency Department (HOSPITAL_COMMUNITY): Payer: PRIVATE HEALTH INSURANCE

## 2013-11-03 DIAGNOSIS — Z9889 Other specified postprocedural states: Secondary | ICD-10-CM | POA: Diagnosis not present

## 2013-11-03 DIAGNOSIS — I200 Unstable angina: Secondary | ICD-10-CM

## 2013-11-03 DIAGNOSIS — E782 Mixed hyperlipidemia: Secondary | ICD-10-CM

## 2013-11-03 DIAGNOSIS — K5289 Other specified noninfective gastroenteritis and colitis: Secondary | ICD-10-CM | POA: Insufficient documentation

## 2013-11-03 DIAGNOSIS — I209 Angina pectoris, unspecified: Principal | ICD-10-CM | POA: Insufficient documentation

## 2013-11-03 DIAGNOSIS — R079 Chest pain, unspecified: Secondary | ICD-10-CM | POA: Diagnosis present

## 2013-11-03 DIAGNOSIS — Z23 Encounter for immunization: Secondary | ICD-10-CM | POA: Insufficient documentation

## 2013-11-03 DIAGNOSIS — I25110 Atherosclerotic heart disease of native coronary artery with unstable angina pectoris: Secondary | ICD-10-CM

## 2013-11-03 DIAGNOSIS — Z7982 Long term (current) use of aspirin: Secondary | ICD-10-CM | POA: Insufficient documentation

## 2013-11-03 DIAGNOSIS — Z79899 Other long term (current) drug therapy: Secondary | ICD-10-CM | POA: Diagnosis not present

## 2013-11-03 DIAGNOSIS — Z720 Tobacco use: Secondary | ICD-10-CM | POA: Diagnosis present

## 2013-11-03 DIAGNOSIS — F17200 Nicotine dependence, unspecified, uncomplicated: Secondary | ICD-10-CM | POA: Insufficient documentation

## 2013-11-03 DIAGNOSIS — R1033 Periumbilical pain: Secondary | ICD-10-CM | POA: Diagnosis present

## 2013-11-03 DIAGNOSIS — I2510 Atherosclerotic heart disease of native coronary artery without angina pectoris: Secondary | ICD-10-CM | POA: Diagnosis not present

## 2013-11-03 DIAGNOSIS — I10 Essential (primary) hypertension: Secondary | ICD-10-CM

## 2013-11-03 DIAGNOSIS — R072 Precordial pain: Secondary | ICD-10-CM | POA: Insufficient documentation

## 2013-11-03 DIAGNOSIS — R0789 Other chest pain: Secondary | ICD-10-CM

## 2013-11-03 HISTORY — DX: Chronic obstructive pulmonary disease, unspecified: J44.9

## 2013-11-03 HISTORY — DX: Tobacco use: Z72.0

## 2013-11-03 HISTORY — DX: Essential (primary) hypertension: I10

## 2013-11-03 HISTORY — DX: Hyperlipidemia, unspecified: E78.5

## 2013-11-03 LAB — CBC WITH DIFFERENTIAL/PLATELET
BASOS ABS: 0 10*3/uL (ref 0.0–0.1)
Basophils Relative: 0 % (ref 0–1)
Eosinophils Absolute: 0.3 10*3/uL (ref 0.0–0.7)
Eosinophils Relative: 6 % — ABNORMAL HIGH (ref 0–5)
HCT: 39.4 % (ref 39.0–52.0)
Hemoglobin: 13.5 g/dL (ref 13.0–17.0)
LYMPHS ABS: 2.9 10*3/uL (ref 0.7–4.0)
LYMPHS PCT: 53 % — AB (ref 12–46)
MCH: 30.1 pg (ref 26.0–34.0)
MCHC: 36.2 g/dL — ABNORMAL HIGH (ref 30.0–36.0)
MCV: 83.7 fL (ref 78.0–100.0)
Monocytes Absolute: 0.5 10*3/uL (ref 0.1–1.0)
Monocytes Relative: 10 % (ref 3–12)
NEUTROS ABS: 1.6 10*3/uL — AB (ref 1.7–7.7)
NEUTROS PCT: 31 % — AB (ref 43–77)
PLATELETS: 265 10*3/uL (ref 150–400)
RBC: 4.71 MIL/uL (ref 4.22–5.81)
RDW: 14.1 % (ref 11.5–15.5)
WBC: 5.4 10*3/uL (ref 4.0–10.5)

## 2013-11-03 LAB — URINE MICROSCOPIC-ADD ON

## 2013-11-03 LAB — LIPID PANEL
CHOL/HDL RATIO: 3.3 ratio
CHOLESTEROL: 207 mg/dL — AB (ref 0–200)
HDL: 63 mg/dL (ref >39)
LDL Cholesterol: 112 mg/dL — ABNORMAL HIGH (ref 0–99)
Triglycerides: 159 mg/dL — ABNORMAL HIGH (ref <150)
VLDL: 32 mg/dL (ref 0–40)

## 2013-11-03 LAB — BASIC METABOLIC PANEL
ANION GAP: 11 (ref 5–15)
BUN: 15 mg/dL (ref 6–23)
CHLORIDE: 103 meq/L (ref 96–112)
CO2: 26 mEq/L (ref 19–32)
Calcium: 9.1 mg/dL (ref 8.4–10.5)
Creatinine, Ser: 1.19 mg/dL (ref 0.50–1.35)
GFR calc non Af Amer: 67 mL/min — ABNORMAL LOW (ref >90)
GFR, EST AFRICAN AMERICAN: 77 mL/min — AB (ref >90)
Glucose, Bld: 108 mg/dL — ABNORMAL HIGH (ref 70–99)
Potassium: 4.2 mEq/L (ref 3.7–5.3)
SODIUM: 140 meq/L (ref 137–147)

## 2013-11-03 LAB — HEPATIC FUNCTION PANEL
ALBUMIN: 3.4 g/dL — AB (ref 3.5–5.2)
ALT: 24 U/L (ref 0–53)
AST: 20 U/L (ref 0–37)
Alkaline Phosphatase: 76 U/L (ref 39–117)
BILIRUBIN TOTAL: 0.3 mg/dL (ref 0.3–1.2)
Total Protein: 6.8 g/dL (ref 6.0–8.3)

## 2013-11-03 LAB — URINALYSIS, ROUTINE W REFLEX MICROSCOPIC
BILIRUBIN URINE: NEGATIVE
GLUCOSE, UA: NEGATIVE mg/dL
Ketones, ur: NEGATIVE mg/dL
Leukocytes, UA: NEGATIVE
Nitrite: NEGATIVE
PH: 6.5 (ref 5.0–8.0)
Protein, ur: NEGATIVE mg/dL
Specific Gravity, Urine: 1.02 (ref 1.005–1.030)
Urobilinogen, UA: 0.2 mg/dL (ref 0.0–1.0)

## 2013-11-03 LAB — TSH: TSH: 1.45 u[IU]/mL (ref 0.350–4.500)

## 2013-11-03 LAB — PROTIME-INR
INR: 0.92 (ref 0.00–1.49)
Prothrombin Time: 12.4 seconds (ref 11.6–15.2)

## 2013-11-03 LAB — APTT: aPTT: 28 seconds (ref 24–37)

## 2013-11-03 LAB — LIPASE, BLOOD: Lipase: 29 U/L (ref 11–59)

## 2013-11-03 LAB — TROPONIN I
Troponin I: <0.3 ng/mL (ref <0.30)
Troponin I: <0.3 ng/mL (ref <0.30)

## 2013-11-03 MED ORDER — ALBUTEROL SULFATE HFA 108 (90 BASE) MCG/ACT IN AERS
2.0000 | INHALATION_SPRAY | Freq: Four times a day (QID) | RESPIRATORY_TRACT | Status: DC | PRN
  Filled 2013-11-03: qty 6.7

## 2013-11-03 MED ORDER — ONDANSETRON HCL 4 MG/2ML IJ SOLN: 4.0000 mg | Freq: Four times a day (QID) | INTRAMUSCULAR | Status: DC | PRN

## 2013-11-03 MED ORDER — ALUM & MAG HYDROXIDE-SIMETH 200-200-20 MG/5ML PO SUSP: 30.0000 mL | Freq: Four times a day (QID) | ORAL | Status: DC | PRN

## 2013-11-03 MED ORDER — NITROGLYCERIN 0.4 MG SL SUBL: 0.4000 mg | SUBLINGUAL_TABLET | SUBLINGUAL | Status: DC | PRN

## 2013-11-03 MED ORDER — MORPHINE SULFATE 4 MG/ML IJ SOLN: 4.0000 mg | INTRAMUSCULAR | Status: DC | PRN

## 2013-11-03 MED ORDER — PNEUMOCOCCAL VAC POLYVALENT 25 MCG/0.5ML IJ INJ
0.5000 mL | INJECTION | INTRAMUSCULAR | Status: AC
  Administered 2013-11-04: 0.5 mL via INTRAMUSCULAR
  Filled 2013-11-03: qty 0.5

## 2013-11-03 MED ORDER — ACETAMINOPHEN 325 MG PO TABS: 650.0000 mg | ORAL_TABLET | Freq: Four times a day (QID) | ORAL | Status: DC | PRN

## 2013-11-03 MED ORDER — NITROGLYCERIN 0.4 MG SL SUBL
0.4000 mg | SUBLINGUAL_TABLET | Freq: Once | SUBLINGUAL | Status: AC
  Administered 2013-11-03: 0.4 mg via SUBLINGUAL
  Filled 2013-11-03: qty 1

## 2013-11-03 MED ORDER — ACETAMINOPHEN 650 MG RE SUPP: 650.0000 mg | Freq: Four times a day (QID) | RECTAL | Status: DC | PRN

## 2013-11-03 MED ORDER — POTASSIUM CHLORIDE IN NACL 20-0.9 MEQ/L-% IV SOLN
INTRAVENOUS | Status: DC
  Administered 2013-11-03 – 2013-11-04 (×2): via INTRAVENOUS

## 2013-11-03 MED ORDER — INFLUENZA VAC SPLIT QUAD 0.5 ML IM SUSY
0.5000 mL | PREFILLED_SYRINGE | INTRAMUSCULAR | Status: AC
  Administered 2013-11-04: 0.5 mL via INTRAMUSCULAR
  Filled 2013-11-03: qty 0.5

## 2013-11-03 MED ORDER — ASPIRIN EC 81 MG PO TBEC
81.0000 mg | DELAYED_RELEASE_TABLET | Freq: Every day | ORAL | Status: DC
  Administered 2013-11-04: 81 mg via ORAL
  Filled 2013-11-03: qty 1

## 2013-11-03 MED ORDER — ALBUTEROL SULFATE (2.5 MG/3ML) 0.083% IN NEBU: 2.5000 mg | INHALATION_SOLUTION | Freq: Four times a day (QID) | RESPIRATORY_TRACT | Status: DC | PRN

## 2013-11-03 MED ORDER — ATORVASTATIN CALCIUM 40 MG PO TABS
80.0000 mg | ORAL_TABLET | Freq: Every day | ORAL | Status: DC
  Administered 2013-11-03: 80 mg via ORAL
  Filled 2013-11-03: qty 2

## 2013-11-03 MED ORDER — FAMOTIDINE 20 MG PO TABS
20.0000 mg | ORAL_TABLET | Freq: Two times a day (BID) | ORAL | Status: DC
  Administered 2013-11-03 – 2013-11-04 (×3): 20 mg via ORAL
  Filled 2013-11-03 (×3): qty 1

## 2013-11-03 MED ORDER — HYDROCODONE-ACETAMINOPHEN 5-325 MG PO TABS
1.0000 | ORAL_TABLET | ORAL | Status: DC | PRN
  Administered 2013-11-03 – 2013-11-04 (×4): 2 via ORAL
  Filled 2013-11-03 (×4): qty 2

## 2013-11-03 MED ORDER — ASPIRIN 325 MG PO TABS
325.0000 mg | ORAL_TABLET | Freq: Once | ORAL | Status: AC
  Administered 2013-11-03: 325 mg via ORAL
  Filled 2013-11-03: qty 1

## 2013-11-03 MED ORDER — ENOXAPARIN SODIUM 100 MG/ML ~~LOC~~ SOLN
90.0000 mg | Freq: Two times a day (BID) | SUBCUTANEOUS | Status: DC
  Administered 2013-11-03 – 2013-11-04 (×3): 90 mg via SUBCUTANEOUS
  Filled 2013-11-03 (×3): qty 1

## 2013-11-03 MED ORDER — DOCUSATE SODIUM 100 MG PO CAPS
100.0000 mg | ORAL_CAPSULE | Freq: Two times a day (BID) | ORAL | Status: DC
Start: 2013-11-03 — End: 2013-11-04
  Administered 2013-11-03 – 2013-11-04 (×3): 100 mg via ORAL
  Filled 2013-11-03 (×3): qty 1

## 2013-11-03 MED ORDER — METOPROLOL TARTRATE 25 MG PO TABS
25.0000 mg | ORAL_TABLET | Freq: Two times a day (BID) | ORAL | Status: DC
  Administered 2013-11-03 – 2013-11-04 (×2): 25 mg via ORAL
  Filled 2013-11-03 (×3): qty 1

## 2013-11-03 MED ORDER — MORPHINE SULFATE 4 MG/ML IJ SOLN
4.0000 mg | Freq: Once | INTRAMUSCULAR | Status: AC
  Administered 2013-11-03: 4 mg via INTRAVENOUS
  Filled 2013-11-03: qty 1

## 2013-11-03 MED ORDER — SIMVASTATIN 20 MG PO TABS: 40.0000 mg | ORAL_TABLET | Freq: Every day | ORAL | Status: DC

## 2013-11-03 MED ORDER — ONDANSETRON HCL 4 MG PO TABS: 4.0000 mg | ORAL_TABLET | Freq: Four times a day (QID) | ORAL | Status: DC | PRN

## 2013-11-03 MED ORDER — ISOSORBIDE MONONITRATE ER 30 MG PO TB24
30.0000 mg | ORAL_TABLET | Freq: Every day | ORAL | Status: DC
  Administered 2013-11-03: 30 mg via ORAL
  Filled 2013-11-03: qty 1

## 2013-11-03 NOTE — Plan of Care (Signed)
Problem: Consults Goal: Tobacco Cessation referral if indicated Outcome: Completed/Met Date Met:  11/03/13 Patient given material on tobacco cessation.

## 2013-11-03 NOTE — Consult Note (Signed)
CARDIOLOGY CONSULT NOTE   Patient ID: Joshua Hall MRN: [id number] DOB/AGE: 09/12/1955 58 y.o.  Admit Date: 11/03/2013 Referring Physician: PTH Primary Physician: Delphina Cahill, MD Consulting Cardiologist: Carlyle Dolly MD Primary Cardiologist : Rozann Lesches MD Reason for Consultation: Chest Pain with known CAD  Clinical Summary Joshua Hall is a 58 y.o.male known history of coronary artery disease, treated medically. Most recent cardiac catheterization in 2013 demonstrating nonobstructive disease of the LAD, with a 99% stenosis to a very small second diagonal Joshua Hall, too small for intervention, and mild nonobstructive disease of the RCA, the left circumflex was normal. He was seen by Dr. Domenic Hall in July of 2014 and was clinically stable.   He presented to the emergency room after experiencing a sudden onset of headache, diaphoresis, shortness of breath, with nausea and vomiting while driving to work. This is also accompanied by substernal chest pain. 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 Following Sunday after sleeping all night. He went back to bed around 9 AM and slept around 5 PM. He just felt very tired with no energy. The following day on Monday, he states he sat around all day. It had been raining and he did not feel very energetic. He got up to come to work this morning.when the above symptoms began.   He also states over the last few, weeks. He has been feeling a "rubbing" each time. he takes a deep breath, which is unusual for him. He denies any coughing, worsening dyspnea, or fever. He has had one episode of his left leg, causing him to have pain in the calf area, causing him to have to sit down. He denies ongoing pain, or edema in that leg since that episode.  On arrival in the emergency room the patient's blood pressure was 143/91, heart rate 63, O2  sat 95%. He was afebrile. Review of this were essentially unremarkable, chest x-ray was stable without evidence of CHF or acute cardiopulmonary process. EKG revealed normal sinus rhythm without ACS. Troponin was found to be negative x1   No Known Allergies  Medications Scheduled Medications: . [START ON 11/04/2013] aspirin EC  81 mg Oral Daily  . docusate sodium  100 mg Oral BID  . enoxaparin (LOVENOX) injection  90 mg Subcutaneous Q12H  . famotidine  20 mg Oral BID  . isosorbide mononitrate  30 mg Oral Daily  . metoprolol tartrate  25 mg Oral BID  . simvastatin  40 mg Oral QHS    Infusions: . 0.9 % NaCl with KCl 20 mEq / L      PRN Medications: acetaminophen, acetaminophen, albuterol, alum & mag hydroxide-simeth, HYDROcodone-acetaminophen, morphine injection, nitroGLYCERIN, ondansetron (ZOFRAN) IV, ondansetron   Past Medical History  Diagnosis Date  . Colitis   . Coronary atherosclerosis of native coronary artery     Largely nonobstructive in major epicardial was with 99% small diagonal stenosis - medical therapy  . COPD (chronic obstructive pulmonary disease)   . Hypertension   . Hyperlipidemia   . Tobacco use     Past Surgical History  Procedure Laterality Date  . Mouth surgery    . Cardiac catheterization  10/2011    [place] [facility] [place]    Family History  Problem Relation Age of Onset  . Heart disease Brother     Died of MI at 29  . Diabetes Mother  Social History Joshua Hall reports that he has been smoking Cigarettes.  He has a 10 pack-year smoking history. He has never used smokeless tobacco. Joshua Hall reports that he drinks alcohol.  Review of Systems Otherwise reviewed and negative except as outlined.  Physical Examination Blood pressure 158/84, pulse 59, temperature 98.2 F (36.8 C), temperature source Oral, resp. rate 16, height 6' (1.829 m), weight 200 lb (90.719 kg), SpO2 97.00%. No intake or output data in the 24 hours ending 11/03/13  0955  Telemetry: Normal sinus rhythm, rates in the 50s at rest.  GEN: Resting with some continued chest pressure.  HEENT: Conjunctiva and lids normal, oropharynx clear with moist mucosa. Neck: Supple, no elevated JVP or carotid bruits, no thyromegaly. Lungs: Clear to auscultation, nonlabored breathing at rest. Cardiac: Regular rate and rhythm, no S3 or significant systolic murmur, no pericardial rub. Abdomen: Soft, nontender, no hepatomegaly, bowel sounds present, no guarding or rebound. Extremities: No pitting edema, distal pulses 2+. Skin: Warm and dry. Musculoskeletal: No kyphosis. Neuropsychiatric: Alert and oriented x3, affect grossly appropriate.  Prior Cardiac Testing/Procedures 1. Cardiac Cath: 10/19/2011 1. Single vessel obstructive coronary disease involving a very tiny diagonal Joshua Hall. Otherwise nonobstructive disease. The diagonal Joshua Hall is too small for intervention.  2. Normal left ventricular function. Recommendations: Aggressive medical therapy and risk factor modification.   Lab Results  Basic Metabolic Panel:  Recent Labs Lab 11/03/13 0601  NA 140  K 4.2  CL 103  CO2 26  GLUCOSE 108*  BUN 15  CREATININE 1.19  CALCIUM 9.1    Liver Function Tests:  Recent Labs Lab 11/03/13 0758  AST 20  ALT 24  ALKPHOS 76  BILITOT 0.3  PROT 6.8  ALBUMIN 3.4*    CBC:  Recent Labs Lab 11/03/13 0553  WBC 5.4  NEUTROABS 1.6*  HGB 13.5  HCT 39.4  MCV 83.7  PLT 265    Cardiac Enzymes:  Recent Labs Lab 11/03/13 0553 11/03/13 0758  TROPONINI <0.30 <0.30    Radiology: Dg Chest Portable 1 View  11/03/2013   CLINICAL DATA:  Chest pain.  EXAM: PORTABLE CHEST - 1 VIEW  COMPARISON:  04/04/2013  FINDINGS: There is borderline cardiomegaly which is stable from previous when accounting for rotation. Prominence of the aortic knob which is also chronic.  There is interstitial coarsening which appears similar to previous given differences in technique. There is no  edema, consolidation, effusion, or pneumothorax.  IMPRESSION: Stable exam, including borderline cardiomegaly.   Electronically Signed   By: Jorje Guild [name]   On: 11/03/2013 06:28     ECG: NSR rate of 65 bpm.   Impression and Recommendations   1.Chest pain with known CAD: Multiple somatic leading up to his admission, to include lethargy, increasing length of sleeping, fatigue, with sudden onset of nausea, vomiting, abdominal pain, radiating to the chest, described as pressure. He also complains of a "rubbing feeling" when he takes a deep breath. Chest x-ray is negative for CHF, bronchitis. D-dimer was not checked, can consider this in the setting of calf pain preceding his current chest discomfort.  Continue to cycle cardiac enzymes. Continue current medication regimen at this time. He appears stable. Consider GI etiology of his discomfort with associated nausea and vomiting along with abdominal pain. I do not see evidence of pericarditis on EKG.  2.CAD: Most recent cardiac cath in a 2013 Mr. single-vessel CAD and a very small diagonal that was not a minimal to intervention. He was continued on medical therapy. He  has been in his usual state of health medically compliant up until this recent admission with symptoms as described. Continue beta blocker, statin, and aspirin. Heart rate has been found to be slightly bradycardic, since she has been here. Watch telemetry to evaluate bradycardia as etiology of symptoms, although unlikely. An echocardiogram to evaluate for decrease in LV systolic function or pericardial effusion.   3. Abdominal Pain with NV: Consider GI evaluation concerning his symptoms. He still has his gallbladder and appendix, and has been complaining of abdominal discomfort prior to the nausea and vomiting. There is no evidence of leukocytosis on labs . LFTs are normal.      Signed: Phill Myron. Lawrence NP  11/03/2013, 9:55 AM Co-Sign MD  Patient seen and discussed with NP  Purcell Nails, I agree with her documentation above. 58 yo male hx of non-obstructive CAD by cath 2013, history of chest pain, hyperlipidemia, HTN admitted with chest pain. Symptoms started while driving to work, suddenly felt nauseous and pulled over. Vomited twice, afterwards had chest pressure 9/10 with +SOB. Episodes on and off throughout the day, lasting a few seconds to few minutes. Better with NG in ER.  - Cath 10/2011 LM patent, LAD 30% prox and mid, very small D2 99% (too small for intervention), large patent ramus, LCX patent, RCA 10-20% mid and distal. LVEF 55-65% by LVgram.  - trop neg x2, Hgb 13.5, Plt 265, K 4.2, Cr 1.19, lipase 29, TC 207, TG 159l HDL 63, LDL 112 - CXR no acute process - EKG SR with no ischemic changes  Mixed presentation for cardiac pain, initial pains symptoms brought on after vomiting twice. Symptoms better with NG, but is lingering esophageal spasm from emitus would also improve with NG. No evidence of ACS at this time by EKG or enzymes. Will f/u echo results, f/u abdominal US. Potential stress test in AM pending these results, he is not able to run on treadmill so would need Lexiscan. Continue medical therapy, given his CAD he should be on high dose statin, change simva 40 to atorva 80mg  daily.    Zandra Abts MD

## 2013-11-03 NOTE — Progress Notes (Signed)
*  PRELIMINARY RESULTS* Echocardiogram 2D Echocardiogram has been performed.  Leavy Cella 11/03/2013, 4:11 PM

## 2013-11-03 NOTE — ED Notes (Signed)
Pt states chest pain is still there,but states his head is hurting worse. Notified pt that is a common side effect of nitroglycerin.

## 2013-11-03 NOTE — ED Notes (Signed)
Pt states he started having pressure type pain in the center of his chest about an hour ago while he was going to work, states "feels like someone sitting on chest"

## 2013-11-03 NOTE — ED Provider Notes (Signed)
CSN: [id number]     Arrival date & time 11/03/13  0541 History   None    Chief Complaint  Patient presents with  . Chest Pain     (Consider location/radiation/quality/duration/timing/severity/associated sxs/prior Treatment) HPI  This a 58 year-old male with a history of coronary artery disease who presents with chest pain. Patient reports onset of chest pain one hour prior to arrival. He describes as pressure. He reports associated shortness of breath. It is improved with rest.  Current pain is 8/10. He wears a nitroglycerin patch every day and has one currently in place. He denies any recent fevers or cough.  Patient was last hospitalized for chest pain rule out in June 2014 and has not seen cardiology since July of 2014. Cardiac cath in 2013 showed single-vessel disease with otherwise nonobstructive disease.  Past Medical History  Diagnosis Date  . Colitis   . Coronary atherosclerosis of native coronary artery     Largely nonobstructive in major epicardial was with 99% small diagonal stenosis - medical therapy   Past Surgical History  Procedure Laterality Date  . Mouth surgery    . Cardiac catheterization  10/2011    [place] [facility] [place]   Family History  Problem Relation Age of Onset  . Heart disease Brother     Died of MI at 69  . Diabetes Mother    History  Substance Use Topics  . Smoking status: Current Every Day Smoker -- 0.25 packs/day for 40 years    Types: Cigarettes  . Smokeless tobacco: Never Used     Comment: smokes 7 ciggerettes daily for the past 2 months  . Alcohol Use: Yes     Comment: Occasional    Review of Systems  Constitutional: Negative.  Negative for fever.  Respiratory: Positive for chest tightness and shortness of breath. Negative for cough.   Cardiovascular: Positive for chest pain. Negative for leg swelling.  Gastrointestinal: Negative.  Negative for abdominal pain.  Genitourinary: Negative.  Negative for dysuria.  Musculoskeletal: Negative  for back pain.  Neurological: Negative for headaches.  All other systems reviewed and are negative.     Allergies  Review of patient's allergies indicates no known allergies.  Home Medications   Prior to Admission medications   Medication Sig Start Date End Date Taking? Authorizing Provider  albuterol (PROVENTIL HFA;VENTOLIN HFA) 108 (90 BASE) MCG/ACT inhaler Inhale 2 puffs into the lungs every 6 (six) hours as needed for wheezing or shortness of breath. 01/21/12  Yes Kathie Dike, MD  aspirin 81 MG tablet Take 81 mg by mouth daily.   Yes Historical Provider, MD  metoprolol tartrate (LOPRESSOR) 25 MG tablet Take 1 tablet (25 mg total) by mouth 2 (two) times daily. 10/20/11 11/03/13 Yes Rhonda G Barrett, PA-C  nitroGLYCERIN (NITRODUR - DOSED IN MG/24 HR) 0.2 mg/hr patch PLACE ONE PATCH ONTO THE SKIN DAILY 11/28/12  Yes Satira Sark, MD  simvastatin (ZOCOR) 40 MG tablet Take 1 tablet (40 mg total) by mouth at bedtime. 09/08/12 11/03/13 Yes Satira Sark, MD  HYDROcodone-acetaminophen (NORCO) 5-325 MG per tablet Take 2 tablets by mouth every 4 (four) hours as needed. 04/04/13   Veryl Speak, MD  nitroGLYCERIN (NITROSTAT) 0.4 MG SL tablet Place 1 tablet (0.4 mg total) under the tongue every 5 (five) minutes as needed for chest pain. 10/20/11 04/04/13  Evelene Croon Barrett, PA-C   BP 126/87  Pulse 82  Temp(Src) 98.5 F (36.9 C) (Oral)  Resp 18  Ht 6' (1.829 m)  Wt 200 lb (90.719 kg)  BMI 27.12 kg/m2  SpO2 99% Physical Exam  Nursing note and vitals reviewed. Constitutional: He is oriented to person, place, and time. He appears well-developed and well-nourished. No distress.  HENT:  Head: Normocephalic and atraumatic.  Eyes: Pupils are equal, round, and reactive to light.  Cardiovascular: Normal rate, regular rhythm and normal heart sounds.   No murmur heard. Pulmonary/Chest: Effort normal and breath sounds normal. No respiratory distress. He has no wheezes.  Abdominal: Soft. Bowel  sounds are normal. There is no tenderness. There is no rebound.  Musculoskeletal: He exhibits no edema.  Neurological: He is alert and oriented to person, place, and time.  Skin: Skin is warm and dry.  Psychiatric: He has a normal mood and affect.    ED Course  Procedures (including critical care time) Labs Review Labs Reviewed  CBC WITH DIFFERENTIAL - Abnormal; Notable for the following:    Neutrophils Relative % 31 (*)    Neutro Abs 1.6 (*)    Lymphocytes Relative 53 (*)    Eosinophils Relative 6 (*)    All other components within normal limits  BASIC METABOLIC PANEL - Abnormal; Notable for the following:    Glucose, Bld 108 (*)    GFR calc non Af Amer 67 (*)    GFR calc Af Amer 77 (*)    All other components within normal limits  TROPONIN I    Imaging Review Dg Chest Portable 1 View  11/03/2013   CLINICAL DATA:  Chest pain.  EXAM: PORTABLE CHEST - 1 VIEW  COMPARISON:  04/04/2013  FINDINGS: There is borderline cardiomegaly which is stable from previous when accounting for rotation. Prominence of the aortic knob which is also chronic.  There is interstitial coarsening which appears similar to previous given differences in technique. There is no edema, consolidation, effusion, or pneumothorax.  IMPRESSION: Stable exam, including borderline cardiomegaly.   Electronically Signed   By: Jorje Guild [name]   On: 11/03/2013 06:28     EKG Interpretation   Date/Time:  Tuesday November 03 2013 06:03:40 EDT Ventricular Rate:  65 PR Interval:  179 QRS Duration: 86 QT Interval:  413 QTC Calculation: 429 R Axis:   15 Text Interpretation:  Sinus rhythm Minimal ST elevation, anterior leads  with baseline wander No acute change Confirmed by HORTON  MD, COURTNEY  ([id number]) on 11/03/2013 6:07:57 AM      MDM   Final diagnoses:  Anginal pain    Patient presents with chest pain. He is nontoxic on exam. EKG is unchanged from prior. History of coronary artery disease which is medically  managed. Patient was given sublingual nitroglycerin and full dose aspirin.  Reports improvement of pain with nitroglycerin from an 8 to 3. Workup is largely unremarkable otherwise. Initial troponin negative. Will admit for formal rule out.    Merryl Hacker, MD 11/03/13 [phone]

## 2013-11-03 NOTE — H&P (Signed)
[facility] History and Physical  Joshua Hall MOQ:[id number] DOB: 21-Feb-1955 DOA: 11/03/2013  Referring physician: ED PHYSICIAN, DR. HORTON PCP: Delphina Cahill, MD   Chief Complaint: Chest pain  HPI: Joshua Hall is a 58 y.o. male  with a history of single vessel obstructive coronary artery disease involving a very tiny diagonal branch per cardiac catheterization September 2013, who presents to the emergency department this morning with a chief complaint of chest pain. The patient woke up at approximately 3:45 AM this morning in preparation for work. At approximately 4:30, while driving, he developed sudden onset of headache, sweatiness, shortness of breath one episode of nausea and vomiting, and substernal chest pain. He pulled over for a few minutes. His symptoms subsided a little but did not completely resolve. He managed to drive himself to the ED. He describes his chest pain as a tightness. It radiates from the left to the right chest wall. Otherwise, he denies radiation to the left jaw or left arm. He rates the pain a 9/10 in intensity. He has had a minimal cough, nonproductive. He has had occasional chills, but no fever. He denies focal weakness. He has had some intermittent periumbilical pain, but he denies hematemesis, diarrhea, black tarry stools, or bright red blood per rectum. He denies any unusual swelling in his legs. He denies pleurisy. He says that he takes aspirin and metoprolol consistently and daily.  In the ED, he was given one sublingual nitroglycerin which decreased his pain from 9/10 to1/10. His EKG reveals normal sinus rhythm with a heart rate of 65 beats per minute and no concerning ST or T wave abnormalities. His chest x-ray reveals no acute cardiopulmonary disease. His initial troponin I is negative. Otherwise his lab data are unremarkable. He is being admitted for further evaluation and management.    Review of Systems:  Review of systems as above in history  present illness. 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 He has occasional cough in the morning. He has occasional pain around his umbilicus. Occasionally he has pain with urination. Otherwise review of systems is negative.  Past Medical History  Diagnosis Date  . Colitis   . Coronary atherosclerosis of native coronary artery     Largely nonobstructive in major epicardial was with 99% small diagonal stenosis - medical therapy  . COPD (chronic obstructive pulmonary disease)   . Hypertension   . Hyperlipidemia   . Tobacco use    Past Surgical History  Procedure Laterality Date  . Mouth surgery    . Cardiac catheterization  10/2011    [facility] [place]   Social History: He is divorced. He has 4 children. 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 He decreased his cigarette smoking to 4 cigarettes daily. He drinks alcohol only on occasion. He denies illicit drug use.   No Known Allergies  Family History  Problem Relation Age of Onset  . Heart disease Brother     Died of MI at 11  . Diabetes Mother   Family history continued: His father died of a massive stroke. His mother is alive and is 9 years of age and has hypertension and diabetes.    Prior to Admission medications   Medication Sig Start Date End Date Taking? Authorizing Provider  albuterol (PROVENTIL HFA;VENTOLIN HFA) 108 (90 BASE) MCG/ACT inhaler Inhale 2 puffs into the lungs every 6 (six) hours as needed for wheezing or  shortness of breath. 01/21/12  Yes Kathie Dike, MD  aspirin 81 MG tablet Take 81 mg by mouth daily.   Yes Historical Provider, MD  metoprolol tartrate (LOPRESSOR) 25 MG tablet Take 1 tablet (25 mg total) by mouth 2 (two) times daily. 10/20/11 11/03/13 Yes Rhonda G Barrett, PA-C  nitroGLYCERIN (NITRODUR - DOSED IN MG/24 HR) 0.2 mg/hr patch PLACE ONE PATCH ONTO THE SKIN DAILY 11/28/12  Yes Satira Sark, MD  simvastatin (ZOCOR) 40 MG tablet  Take 1 tablet (40 mg total) by mouth at bedtime. 09/08/12 11/03/13 Yes Satira Sark, MD  HYDROcodone-acetaminophen (NORCO) 5-325 MG per tablet Take 2 tablets by mouth every 4 (four) hours as needed. 04/04/13   Veryl Speak, MD  nitroGLYCERIN (NITROSTAT) 0.4 MG SL tablet Place 1 tablet (0.4 mg total) under the tongue every 5 (five) minutes as needed for chest pain. 10/20/11 04/04/13  Lonn Georgia, PA-C   Physical Exam: Filed Vitals:   11/03/13 0623 11/03/13 0630 11/03/13 0700 11/03/13 0740  BP: 130/88 126/87 127/82 140/87  Pulse: 73 82 72 65  Temp:      TempSrc:      Resp: 18 18 21 21   Height:      Weight:      SpO2: 95% 99% 97% 99%    Wt Readings from Last 3 Encounters:  11/03/13 90.719 kg (200 lb)  04/04/13 91.173 kg (201 lb)  09/08/12 91.173 kg (201 lb)    General:  Appears calm and comfortable; pleasant 58 year old African-American man in no acute distress.  Eyes: PERRL, normal lids, irises & conjunctiva; conjunctivae are clear, sclerae are white.  ENT: grossly normal hearing, lips & tongue; mucous membranes are mildly dry. No posterior exudates or erythema.  Neck: no LAD, masses or thyromegaly Cardiovascular: S1, S2, with no murmurs rubs or gallops.; Pedal pulses palpable. No pedal edema. Telemetry: SR, no arrhythmias  Respiratory: occasional fine crackles in the bases, otherwise clear. Breathing nonlabored. Abdomen: positive bowel sounds, mildly obese, very minimal tenderness periumbilically; otherwise no masses or distention.  Skin: no rash or induration seen on limited exam Musculoskeletal: grossly normal tone BUE/BLE; no hot acute joints.  Psychiatric: grossly normal mood and affect, speech fluent and appropriate Neurologic: grossly non-focal; cranial nerves II through XII are intact. Strength is 5 over 5 throughout. Sensation is intact.           Labs on Admission:  Basic Metabolic Panel:  Recent Labs Lab 11/03/13 0601  NA 140  K 4.2  CL 103  CO2 26    GLUCOSE 108*  BUN 15  CREATININE 1.19  CALCIUM 9.1   Liver Function Tests: No results found for this basename: AST, ALT, ALKPHOS, BILITOT, PROT, ALBUMIN,  in the last 168 hours No results found for this basename: LIPASE, AMYLASE,  in the last 168 hours No results found for this basename: AMMONIA,  in the last 168 hours CBC:  Recent Labs Lab 11/03/13 0553  WBC 5.4  NEUTROABS 1.6*  HGB 13.5  HCT 39.4  MCV 83.7  PLT 265   Cardiac Enzymes:  Recent Labs Lab 11/03/13 0553  TROPONINI <0.30    BNP (last 3 results) No results found for this basename: PROBNP,  in the last 8760 hours CBG: No results found for this basename: GLUCAP,  in the last 168 hours  Radiological Exams on Admission: Dg Chest Portable 1 View  11/03/2013   CLINICAL DATA:  Chest pain.  EXAM: PORTABLE CHEST - 1 VIEW  COMPARISON:  04/04/2013  FINDINGS: There is borderline cardiomegaly which is stable from previous when accounting for rotation. Prominence of the aortic knob which is also chronic.  There is interstitial coarsening which appears similar to previous given differences in technique. There is no edema, consolidation, effusion, or pneumothorax.  IMPRESSION: Stable exam, including borderline cardiomegaly.   Electronically Signed   By: Jorje Guild [name]   On: 11/03/2013 06:28    EKG: Independently reviewed. as above in history present illness. Normal sinus rhythm with a heart rate of 65 beats per minute and no concerning ST or T wave abnormalities.  Cardiac Cath: 10/19/2011  Left mainstem: Normal.  Left anterior descending (LAD): There is 30% stenosis in the proximal and mid LAD. The second diagonal is a very small branch. It is only 1 mm in diameter. It has a 99% stenosis proximally.  There is a large ramus intermediate branch which is normal.  Left circumflex (LCx): Normal.  Right coronary artery (RCA): Mild nonobstructive disease in the mid and distal vessel up to 10-20%.  Left ventriculography:  Left ventricular systolic function is normal, LVEF is estimated at 55-65%, there is no significant mitral regurgitation  Final Conclusions:  1. Single vessel obstructive coronary disease involving a very tiny diagonal branch. Otherwise nonobstructive disease. The diagonal branch is too small for intervention.  2. Normal left ventricular function.  Recommendations: Aggressive medical therapy and risk factor modification.    Assessment/Plan Principal Problem:   Unstable angina Active Problems:   Coronary atherosclerosis of native coronary artery   Essential hypertension, benign   Mixed hyperlipidemia   Tobacco use   Periumbilical pain   1. Chest pain in a patient with coronary artery disease; treating as unstable angina. The patient's pain resolved with supplemental nitroglycerin in the ED. He is hemodynamically stable. We'll consult He has a known history of coronary artery disease per cardiac catheterization in 2013 which revealed the results as above. Medical therapy was recommended. He is currently being treated with aspirin, beta blocker, nitroglycerin patch, and statin. These medications will be continued except nitroglycerin patch.. Will add supplemental nitroglycerin when necessary. We'll start full dose Lovenox. We'll start Imdur and discontinue the nitroglycerin patch. [facility] consult cardiology. For further evaluation, we'll order cardiac enzymes, fasting lipid profile, lipase LFTs, PT/PTT, and and TSH. We'll make him virtually n.p.o. until cardiology sees him. Discontinue full dose Lovenox if she completely rules out and/or if cardiology deems it unnecessary. 2. Hypertension. Currently stable. Continue metoprolol. 3. Hyperlipidemia. We'll continue statin and check a fasting lipid panel. 4. Mild periumbilical pain/tenderness. We'll order LFTs, lipase, and ultrasound of the abdomen. We'll start Pepcid empirically. 5. Tobacco use. The patient was encouraged to stop smoking  altogether.    Code Status: Full code DVT Prophylaxis:. Lovenox for now. Family Communication: discussed with the patient, family not available. Disposition Plan: discharged to home in the next 24-48 hours, depending on the evaluation.  Time spent: one hour  [facility] Pager [phone]

## 2013-11-03 NOTE — Progress Notes (Signed)
ANTICOAGULATION CONSULT NOTE - Initial Consult  Pharmacy Consult for Lovenox Indication: chest pain/ACS  No Known Allergies  Patient Measurements: Height: 6' (182.9 cm) Weight: 200 lb (90.719 kg) IBW/kg (Calculated) : 77.6  Vital Signs: Temp: 98.5 F (36.9 C) (09/22 0613) Temp src: Oral (09/22 0613) BP: 140/87 mmHg (09/22 0740) Pulse Rate: 65 (09/22 0740)  Labs:  Recent Labs  11/03/13 0553 11/03/13 0601  HGB 13.5  --   HCT 39.4  --   PLT 265  --   CREATININE  --  1.19  TROPONINI <0.30  --    Estimated Creatinine Clearance: 76.1 ml/min (by C-G formula based on Cr of 1.19).  Medical History: Past Medical History  Diagnosis Date  . Colitis   . Coronary atherosclerosis of native coronary artery     Largely nonobstructive in major epicardial was with 99% small diagonal stenosis - medical therapy  . COPD (chronic obstructive pulmonary disease)   . Hypertension   . Hyperlipidemia   . Tobacco use     Medications:  Scheduled:  . [START ON 11/04/2013] aspirin  81 mg Oral Daily  . docusate sodium  100 mg Oral BID  . enoxaparin (LOVENOX) injection  90 mg Subcutaneous Q12H  . isosorbide mononitrate  30 mg Oral Daily  . metoprolol tartrate  25 mg Oral BID  . simvastatin  40 mg Oral QHS    Assessment: 58yo male c/o CP.  Pt has good renal fxn.  Asked to initiate full dose Lovenox.   Goal of Therapy:  Anticoagulation with Lovenox Monitor platelets by anticoagulation protocol: Yes   Plan:  Lovenox 1mg /Kg SQ q12hrs Monitor labs and CBC  Tanyiah Laurich A 11/03/2013,7:47 AM

## 2013-11-04 ENCOUNTER — Observation Stay (HOSPITAL_COMMUNITY): Payer: PRIVATE HEALTH INSURANCE

## 2013-11-04 ENCOUNTER — Encounter (HOSPITAL_COMMUNITY): Payer: Self-pay

## 2013-11-04 DIAGNOSIS — R0789 Other chest pain: Secondary | ICD-10-CM

## 2013-11-04 DIAGNOSIS — R079 Chest pain, unspecified: Secondary | ICD-10-CM

## 2013-11-04 LAB — COMPREHENSIVE METABOLIC PANEL
ALBUMIN: 3.2 g/dL — AB (ref 3.5–5.2)
ALK PHOS: 74 U/L (ref 39–117)
ALT: 25 U/L (ref 0–53)
AST: 22 U/L (ref 0–37)
Anion gap: 10 (ref 5–15)
BILIRUBIN TOTAL: 0.3 mg/dL (ref 0.3–1.2)
BUN: 11 mg/dL (ref 6–23)
CO2: 26 mEq/L (ref 19–32)
CREATININE: 1.14 mg/dL (ref 0.50–1.35)
Calcium: 8.5 mg/dL (ref 8.4–10.5)
Chloride: 102 mEq/L (ref 96–112)
GFR calc Af Amer: 81 mL/min — ABNORMAL LOW (ref >90)
GFR calc non Af Amer: 70 mL/min — ABNORMAL LOW (ref >90)
Glucose, Bld: 108 mg/dL — ABNORMAL HIGH (ref 70–99)
POTASSIUM: 4 meq/L (ref 3.7–5.3)
Sodium: 138 mEq/L (ref 137–147)
Total Protein: 6.6 g/dL (ref 6.0–8.3)

## 2013-11-04 LAB — CBC
HEMATOCRIT: 36.7 % — AB (ref 39.0–52.0)
HEMOGLOBIN: 13.4 g/dL (ref 13.0–17.0)
MCH: 30.7 pg (ref 26.0–34.0)
MCHC: 36.5 g/dL — ABNORMAL HIGH (ref 30.0–36.0)
MCV: 84 fL (ref 78.0–100.0)
Platelets: 252 10*3/uL (ref 150–400)
RBC: 4.37 MIL/uL (ref 4.22–5.81)
RDW: 14 % (ref 11.5–15.5)
WBC: 5.6 10*3/uL (ref 4.0–10.5)

## 2013-11-04 MED ORDER — TECHNETIUM TC 99M SESTAMIBI - CARDIOLITE
10.0000 | Freq: Once | INTRAVENOUS | Status: AC | PRN
  Administered 2013-11-04: 10 via INTRAVENOUS

## 2013-11-04 MED ORDER — TECHNETIUM TC 99M SESTAMIBI GENERIC - CARDIOLITE: 30.0000 | Freq: Once | INTRAVENOUS | Status: DC | PRN

## 2013-11-04 MED ORDER — SIMVASTATIN 40 MG PO TABS: 40.0000 mg | ORAL_TABLET | Freq: Every day | ORAL | Status: DC

## 2013-11-04 MED ORDER — SODIUM CHLORIDE 0.9 % IJ SOLN
INTRAMUSCULAR | Status: AC
  Administered 2013-11-04: 10 mL via INTRAVENOUS
  Filled 2013-11-04: qty 10

## 2013-11-04 MED ORDER — REGADENOSON 0.4 MG/5ML IV SOLN
INTRAVENOUS | Status: AC
  Administered 2013-11-04: 0.4 mg via INTRAVENOUS
  Filled 2013-11-04: qty 5

## 2013-11-04 MED ORDER — REGADENOSON 0.4 MG/5ML IV SOLN
0.4000 mg | Freq: Once | INTRAVENOUS | Status: AC | PRN
  Administered 2013-11-04: 0.4 mg via INTRAVENOUS
  Filled 2013-11-04: qty 5

## 2013-11-04 MED ORDER — SODIUM CHLORIDE 0.9 % IJ SOLN
10.0000 mL | INTRAMUSCULAR | Status: DC | PRN
  Administered 2013-11-04: 10 mL via INTRAVENOUS

## 2013-11-04 MED ORDER — PANTOPRAZOLE SODIUM 40 MG PO TBEC: 40.0000 mg | DELAYED_RELEASE_TABLET | Freq: Every day | ORAL | Status: DC

## 2013-11-04 MED ORDER — METOPROLOL TARTRATE 25 MG PO TABS: 25.0000 mg | ORAL_TABLET | Freq: Two times a day (BID) | ORAL | Status: DC

## 2013-11-04 NOTE — Discharge Summary (Signed)
Physician Discharge Summary  Joshua Hall WUJ:[id number] DOB: 1955/11/08 DOA: 11/03/2013  PCP: Delphina Cahill, MD  Admit date: 11/03/2013 Discharge date: 11/04/2013  Time spent: 40 minutes  Recommendations for Outpatient Follow-up:  1. Follow up with primary care physician in 1 week  Discharge Diagnoses:  Principal Problem:   Chest pain, possibly musculoskeletal Active Problems:   Mixed hyperlipidemia   Coronary atherosclerosis of native coronary artery   Essential hypertension, benign   Tobacco use   Periumbilical pain   Discharge Condition: improved  Diet recommendation: low salt  Filed Weights   11/03/13 0613 11/03/13 0848 11/04/13 0604  Weight: 90.719 kg (200 lb) 90.719 kg (200 lb) 98.5 kg (217 lb 2.5 oz)    History of present illness and hospital course:  This patient presents to the hospital with complaints of chest pain. He was admitted to a telemetry unit. He ruled out for ACS with negative cardiac markers. He was seen by cardiology and underwent stress testing which did not show any signs of ischemia. Echocardiogram was also found to be unremarkable. The patient was monitored overnight and did not have any further pain. He was able to ambulate without recurrence of his symptoms. His possible that his pain may be musculoskeletal in origin. Patient felt improved and was discharged home.  Procedures: Echo: - Left ventricle: The cavity size was normal. Wall thickness was normal. Systolic function was normal. The estimated ejection fraction was in the range of 60% to 65%. Wall motion was normal; there were no regional wall motion abnormalities. Left ventricular diastolic function parameters were normal. - Aortic valve: Valve area (VTI): 2.86 cm^2. Valve area (Vmax): 2.56 cm^2. - Systemic veins: IVC is small, suggestive of low RA pressure and hypovolemia. - Technically adequate study.    Consultations:  cardiology  Discharge Exam: Filed Vitals:   11/04/13 1453   BP: 139/76  Pulse: 58  Temp: 99.3 F (37.4 C)  Resp: 20    General: NAD Cardiovascular: s1, S2 RRR Respiratory: CTA B  Discharge Instructions You were cared for by a hospitalist during your hospital stay. If you have any questions about your discharge medications or the care you received while you were in the hospital after you are discharged, you can call the unit and asked to speak with the hospitalist on call if the hospitalist that took care of you is not available. Once you are discharged, your primary care physician will handle any further medical issues. Please note that NO REFILLS for any discharge medications will be authorized once you are discharged, as it is imperative that you return to your primary care physician (or establish a relationship with a primary care physician if you do not have one) for your aftercare needs so that they can reassess your need for medications and monitor your lab values.  Discharge Instructions   Call MD for:  difficulty breathing, headache or visual disturbances    Complete by:  As directed      Call MD for:  severe uncontrolled pain    Complete by:  As directed      Diet - low sodium heart healthy    Complete by:  As directed      Increase activity slowly    Complete by:  As directed           Discharge Medication List as of 11/04/2013  3:45 PM    START taking these medications   Details  pantoprazole (PROTONIX) 40 MG tablet Take 1 tablet (40 mg  total) by mouth daily., Starting 11/04/2013, Until Discontinued, Print      CONTINUE these medications which have CHANGED   Details  metoprolol tartrate (LOPRESSOR) 25 MG tablet Take 1 tablet (25 mg total) by mouth 2 (two) times daily., Starting 11/04/2013, Last dose on Sat 11/19/15, Print    simvastatin (ZOCOR) 40 MG tablet Take 1 tablet (40 mg total) by mouth at bedtime., Starting 11/04/2013, Last dose on Thu 12/30/14, Normal      CONTINUE these medications which have NOT CHANGED   Details   albuterol (PROVENTIL HFA;VENTOLIN HFA) 108 (90 BASE) MCG/ACT inhaler Inhale 2 puffs into the lungs every 6 (six) hours as needed for wheezing or shortness of breath., Starting 01/21/2012, Until Discontinued, Print    aspirin EC 81 MG tablet Take 81 mg by mouth daily., Until Discontinued, Historical Med    nitroGLYCERIN (NITRODUR - DOSED IN MG/24 HR) 0.2 mg/hr patch Place 0.2 mg onto the skin daily., Until Discontinued, Historical Med    nitroGLYCERIN (NITROSTAT) 0.4 MG SL tablet Place 1 tablet (0.4 mg total) under the tongue every 5 (five) minutes as needed for chest pain., Starting 10/20/2011, Until Discontinued, Normal      STOP taking these medications     aspirin 81 MG tablet        Allergies  Allergen Reactions  . Bee Venom    Follow-up Information   Follow up with Delphina Cahill, MD. Schedule an appointment as soon as possible for a visit in 2 weeks.   Specialty:  Internal Medicine   Contact information:    [place] [id number] [phone]        The results of significant diagnostics from this hospitalization (including imaging, microbiology, ancillary and laboratory) are listed below for reference.    Significant Diagnostic Studies: US Abdomen Complete  11/03/2013   CLINICAL DATA:  Periumbilical and mid abdomen pain.  EXAM: ULTRASOUND ABDOMEN COMPLETE  COMPARISON:  None.  FINDINGS: Gallbladder:  No gallstones or wall thickening visualized. No sonographic Murphy sign noted.  Common bile duct:  Diameter: prominent proximally measuring 8.2 mm but tapers to normal caliber distally measuring 4.4 mm. No focal discrete obstructing stone is identified in the visualized portions of common bowel duct.  Liver:  There is mild diffuse increased echotexture of the liver. The right lobe liver is enlarged measuring 18.4 cm.  IVC:  No abnormality visualized.  Pancreas:  Visualized portion unremarkable.  Spleen:  Size and appearance within normal limits.  Right Kidney:  Length:  11.7 cm. Echogenicity within normal limits. No mass or hydronephrosis visualized.  Left Kidney:  Length: 10.9 cm. Echogenicity within normal limits. No mass or hydronephrosis visualized.  Abdominal aorta:  No aneurysm visualized.  Other findings:  None.  IMPRESSION: No acute abnormality identified. Fatty infiltration of liver. Prominent common bowel duct proximally which tapers to normal caliber distally.   Electronically Signed   By: Abelardo Diesel [name]   On: 11/03/2013 10:42   Nm Myocar Multi W/spect W/wall Motion / Ef  11/04/2013   CLINICAL DATA:  58 year old male with a known history of coronary artery disease referred for chest pain.  EXAM: MYOCARDIAL IMAGING WITH SPECT (REST AND PHARMACOLOGIC-STRESS)  GATED LEFT VENTRICULAR WALL MOTION STUDY  LEFT VENTRICULAR EJECTION FRACTION  TECHNIQUE: Standard myocardial SPECT imaging was performed after resting intravenous injection of 10 mCi Tc-59m sestamibi. Subsequently, intravenous infusion of Lexiscan was performed under the supervision of the Cardiology staff. At peak effect of the drug, 30 mCi  Tc-63m sestamibi was injected intravenously and standard myocardial SPECT imaging was performed. Quantitative gated imaging was also performed to evaluate left ventricular wall motion, and estimate left ventricular ejection fraction.  COMPARISON:  None.  FINDINGS: Pharmacological stress  Baseline EKG showed normal sinus rhythm. After injection heart rate increased from 53 beats per min up to 88 beats per min, and blood pressure decreased from 133/90 down to 130/86. The test was stopped after injection was complete, the patient did not experience any chest pain. The post-injection EKG showed no specific ischemic changes and no significant arrhythmias P  Perfusion: No decreased activity in the left ventricle on stress imaging to suggest reversible ischemia or infarction.  Wall Motion: There is a small mild intensity fixed apical defect. The apex has normal wall motion,  finding is most consistent with apical thinning. There are no other myocardial perfusion defects.  Left Ventricular Ejection Fraction: 52 %  End diastolic volume 825 ml  End systolic volume 67 ml  IMPRESSION: 1. No reversible ischemia or infarction.  2. Normal left ventricular wall motion.  3. Left ventricular ejection fraction 52%  4. Low-risk stress test findings*.  *2012 Appropriate Use Criteria for Coronary Revascularization Focused Update: J Am Coll Cardiol. 0539;76(7):341-937. http://content.airportbarriers.com.aspx?articleid=1201161   Electronically Signed   By: Carlyle Dolly   On: 11/04/2013 10:57   Dg Chest Portable 1 View  11/03/2013   CLINICAL DATA:  Chest pain.  EXAM: PORTABLE CHEST - 1 VIEW  COMPARISON:  04/04/2013  FINDINGS: There is borderline cardiomegaly which is stable from previous when accounting for rotation. Prominence of the aortic knob which is also chronic.  There is interstitial coarsening which appears similar to previous given differences in technique. There is no edema, consolidation, effusion, or pneumothorax.  IMPRESSION: Stable exam, including borderline cardiomegaly.   Electronically Signed   By: Jorje Guild [name]   On: 11/03/2013 06:28    Microbiology: No results found for this or any previous visit (from the past 240 hour(s)).   Labs: Basic Metabolic Panel:  Recent Labs Lab 11/03/13 0601 11/04/13 0531  NA 140 138  K 4.2 4.0  CL 103 102  CO2 26 26  GLUCOSE 108* 108*  BUN 15 11  CREATININE 1.19 1.14  CALCIUM 9.1 8.5   Liver Function Tests:  Recent Labs Lab 11/03/13 0758 11/04/13 0531  AST 20 22  ALT 24 25  ALKPHOS 76 74  BILITOT 0.3 0.3  PROT 6.8 6.6  ALBUMIN 3.4* 3.2*    Recent Labs Lab 11/03/13 0758  LIPASE 29   No results found for this basename: AMMONIA,  in the last 168 hours CBC:  Recent Labs Lab 11/03/13 0553 11/04/13 0531  WBC 5.4 5.6  NEUTROABS 1.6*  --   HGB 13.5 13.4  HCT 39.4 36.7*  MCV 83.7 84.0  PLT 265  252   Cardiac Enzymes:  Recent Labs Lab 11/03/13 0553 11/03/13 0758 11/03/13 1320 11/03/13 1933  TROPONINI <0.30 <0.30 <0.30 <0.30   BNP: BNP (last 3 results) No results found for this basename: PROBNP,  in the last 8760 hours CBG: No results found for this basename: GLUCAP,  in the last 168 hours     Signed:  MEMON,JEHANZEB  [facility] 11/04/2013, 8:20 PM

## 2013-11-04 NOTE — Progress Notes (Signed)
Stress Lab Nurses Notes - [facility]  [facility] 11/04/2013 Reason for doing test: CAD and Chest Pain Type of test: Lexiscan Cardiolite/Inpatient Rm 322 Nurse performing test: Gerrit Halls, RN Nuclear Medicine Tech: Melburn Hake Echo Tech: Not Applicable MD performing test: Branch/M.Lenze PA Family MD: [place] Crane Test explained and consent signed: Yes.   IV started: Saline lock flushed, No redness or edema and Saline lock from floor Symptoms: Chest tightness Treatment/Intervention: None Reason test stopped: protocol completed After recovery IV was: No redness or edema and Saline Lock flushed Patient to return to [facility]. Med at : 8:45 Patient discharged: Transported back to room 322 via wc Patient's Condition upon discharge was: stable Comments: During test BP 129/97 & HR 80.  Recovery BP 130/82 & HR 65.  Symptoms resolved in recovery.  Geanie Cooley T

## 2013-11-04 NOTE — Progress Notes (Signed)
UR completed 

## 2013-11-04 NOTE — Progress Notes (Signed)
Patient ID: Joshua Hall, male   DOB: 1955/08/28, 58 y.o.   MRN: [id number]    Subjective:    Denies any chest pain today  Objective:   Temp:  [97.7 F (36.5 C)-98.5 F (36.9 C)] 97.7 F (36.5 C) (09/23 0604) Pulse Rate:  [58-68] 59 (09/23 0604) Resp:  [16-20] 20 (09/23 0604) BP: (115-146)/(67-91) 137/91 mmHg (09/23 0604) SpO2:  [95 %-97 %] 97 % (09/23 0604) Weight:  [217 lb 2.5 oz (98.5 kg)] 217 lb 2.5 oz (98.5 kg) (09/23 0604) Last BM Date: 11/02/13  Filed Weights   11/03/13 7846 11/03/13 0848 11/04/13 0604  Weight: 200 lb (90.719 kg) 200 lb (90.719 kg) 217 lb 2.5 oz (98.5 kg)    Intake/Output Summary (Last 24 hours) at 11/04/13 1041 Last data filed at 11/04/13 0900  Gross per 24 hour  Intake 1658.75 ml  Output   2800 ml  Net -1141.25 ml    Exam:  General: NAD  Resp: CTAB  Cardiac: RRR, no m/r/g, no JVD, no carotid bruits  GI: abdomen soft, NT, ND  MSK: no LE edema  Neuro: no focal deficits  Psych: appropriate affect  Lab Results:  Basic Metabolic Panel:  Recent Labs Lab 11/03/13 0601 11/04/13 0531  NA 140 138  K 4.2 4.0  CL 103 102  CO2 26 26  GLUCOSE 108* 108*  BUN 15 11  CREATININE 1.19 1.14  CALCIUM 9.1 8.5    Liver Function Tests:  Recent Labs Lab 11/03/13 0758 11/04/13 0531  AST 20 22  ALT 24 25  ALKPHOS 76 74  BILITOT 0.3 0.3  PROT 6.8 6.6  ALBUMIN 3.4* 3.2*    CBC:  Recent Labs Lab 11/03/13 0553 11/04/13 0531  WBC 5.4 5.6  HGB 13.5 13.4  HCT 39.4 36.7*  MCV 83.7 84.0  PLT 265 252    Cardiac Enzymes:  Recent Labs Lab 11/03/13 0758 11/03/13 1320 11/03/13 1933  TROPONINI <0.30 <0.30 <0.30    BNP: No results found for this basename: PROBNP,  in the last 8760 hours  Coagulation:  Recent Labs Lab 11/03/13 0758  INR 0.92    ECG:   Medications:   Scheduled Medications: . aspirin EC  81 mg Oral Daily  . atorvastatin  80 mg Oral q1800  . docusate sodium  100 mg Oral BID  . enoxaparin (LOVENOX)  injection  90 mg Subcutaneous Q12H  . famotidine  20 mg Oral BID  . Influenza vac split quadrivalent PF  0.5 mL Intramuscular Tomorrow-1000  . metoprolol tartrate  25 mg Oral BID  . pneumococcal 23 valent vaccine  0.5 mL Intramuscular Tomorrow-1000     Infusions: . 0.9 % NaCl with KCl 20 mEq / L 75 mL/hr at 11/04/13 0152     PRN Medications:  acetaminophen, acetaminophen, albuterol, alum & mag hydroxide-simeth, HYDROcodone-acetaminophen, morphine injection, nitroGLYCERIN, ondansetron (ZOFRAN) IV, ondansetron, sodium chloride, technetium sestamibi generic     Assessment/Plan    1. Chest pain - mixed symptoms for cardiac etiology, no evidence of ACS by EKG or enzymes - known hx of CAD by cath 10/2011, severe diag disease too small for intervention, otherwise mild non-obstructive disease at that time - echo 11/03/13 LVEF 60-65%, no WMAs, normal diastolic function - Lexiscan MPI without evidence of ischemia - appears to be noncardiac chest pain, no further cardiac testing planned at this time. Will signoff of inpatient care.        Carlyle Dolly, M.D

## 2013-11-24 ENCOUNTER — Other Ambulatory Visit (HOSPITAL_COMMUNITY): Payer: Self-pay | Admitting: Internal Medicine

## 2013-11-24 DIAGNOSIS — I70213 Atherosclerosis of native arteries of extremities with intermittent claudication, bilateral legs: Secondary | ICD-10-CM

## 2013-12-02 ENCOUNTER — Ambulatory Visit (HOSPITAL_COMMUNITY)
Admission: RE | Admit: 2013-12-02 | Discharge: 2013-12-02 | Disposition: A | Discharge status: Home / Self Care | Payer: PRIVATE HEALTH INSURANCE | Source: Ambulatory Visit | Attending: Internal Medicine | Admitting: Internal Medicine

## 2013-12-02 DIAGNOSIS — Z87891 Personal history of nicotine dependence: Secondary | ICD-10-CM | POA: Diagnosis not present

## 2013-12-02 DIAGNOSIS — E785 Hyperlipidemia, unspecified: Secondary | ICD-10-CM | POA: Diagnosis not present

## 2013-12-02 DIAGNOSIS — M79662 Pain in left lower leg: Secondary | ICD-10-CM | POA: Diagnosis present

## 2013-12-02 DIAGNOSIS — I70213 Atherosclerosis of native arteries of extremities with intermittent claudication, bilateral legs: Secondary | ICD-10-CM

## 2013-12-15 ENCOUNTER — Encounter: Payer: Self-pay | Admitting: Cardiovascular Disease

## 2013-12-15 ENCOUNTER — Ambulatory Visit (INDEPENDENT_AMBULATORY_CARE_PROVIDER_SITE_OTHER): Payer: PRIVATE HEALTH INSURANCE | Admitting: Cardiovascular Disease

## 2013-12-15 VITALS — BP 130/78 | HR 80 | Ht 72.0 in | Wt 223.4 lb

## 2013-12-15 DIAGNOSIS — I739 Peripheral vascular disease, unspecified: Secondary | ICD-10-CM

## 2013-12-15 DIAGNOSIS — I2510 Atherosclerotic heart disease of native coronary artery without angina pectoris: Secondary | ICD-10-CM

## 2013-12-15 DIAGNOSIS — I10 Essential (primary) hypertension: Secondary | ICD-10-CM

## 2013-12-15 DIAGNOSIS — Z720 Tobacco use: Secondary | ICD-10-CM

## 2013-12-15 LAB — CBC
HEMATOCRIT: 39.8 % (ref 39.0–52.0)
Hemoglobin: 13.5 g/dL (ref 13.0–17.0)
MCHC: 34 g/dL (ref 30.0–36.0)
MCV: 89.4 fl (ref 78.0–100.0)
Platelets: 254 10*3/uL (ref 150.0–400.0)
RBC: 4.45 Mil/uL (ref 4.22–5.81)
RDW: 14.1 % (ref 11.5–15.5)
WBC: 7.2 10*3/uL (ref 4.0–10.5)

## 2013-12-15 LAB — BASIC METABOLIC PANEL
BUN: 16 mg/dL (ref 6–23)
CHLORIDE: 108 meq/L (ref 96–112)
CO2: 27 mEq/L (ref 19–32)
Calcium: 9.3 mg/dL (ref 8.4–10.5)
Creatinine, Ser: 1.1 mg/dL (ref 0.4–1.5)
GFR: 87.95 mL/min (ref >60.00)
GLUCOSE: 121 mg/dL — AB (ref 70–99)
Potassium: 4.2 mEq/L (ref 3.5–5.1)
Sodium: 138 mEq/L (ref 135–145)

## 2013-12-15 LAB — PROTIME-INR
INR: 0.8 ratio (ref 0.8–1.0)
Prothrombin Time: 9.4 s — ABNORMAL LOW (ref 9.6–13.1)

## 2013-12-15 NOTE — Progress Notes (Signed)
Primary care physician:Dr. Nevada Crane Primary cardiologist: Dr. Domenic Polite  HPI  [name] is a 58 y.o.malewho was referred for evaluation and management of recently diagnosed peripheral arterial disease. He has known history of coronary artery disease mainly affecting diagonal branch, treated medically. Most recent cardiac catheterization in 2013 demonstrating nonobstructive disease of the LAD, with a 99% stenosis to a very small second diagonal branch, too small for intervention, and mild nonobstructive disease of the RCA, the left circumflex was normal. He has chronic medical conditions that include tobacco use, hypertension and hyperlipidemia. He noticed progressive left calf discomfort with walking since March. It's described as cramping that happens after walking less than one block. It's usually severe and forces him to stop for 5 minutes before he can resume again. He has no rest pain or lower extremity ulceration. ABI was normal on the right side and 0.85 on the left side.  Allergies  Allergen Reactions  . Bee Venom      Current Outpatient Prescriptions on File Prior to Visit  Medication Sig Dispense Refill  . albuterol (PROVENTIL HFA;VENTOLIN HFA) 108 (90 BASE) MCG/ACT inhaler Inhale 2 puffs into the lungs every 6 (six) hours as needed for wheezing or shortness of breath. 1 Inhaler 0  . aspirin EC 81 MG tablet Take 81 mg by mouth daily.    . metoprolol tartrate (LOPRESSOR) 25 MG tablet Take 1 tablet (25 mg total) by mouth 2 (two) times daily. 60 tablet 1  . nitroGLYCERIN (NITRODUR - DOSED IN MG/24 HR) 0.2 mg/hr patch Place 0.2 mg onto the skin daily.    . nitroGLYCERIN (NITROSTAT) 0.4 MG SL tablet Place 1 tablet (0.4 mg total) under the tongue every 5 (five) minutes as needed for chest pain. 25 tablet 3  . pantoprazole (PROTONIX) 40 MG tablet Take 1 tablet (40 mg total) by mouth daily. 30 tablet 1  . simvastatin (ZOCOR) 40 MG tablet Take 1 tablet (40 mg total) by mouth at bedtime. 30  tablet 1   No current facility-administered medications on file prior to visit.     Past Medical History  Diagnosis Date  . Colitis   . Coronary atherosclerosis of native coronary artery     Largely nonobstructive in major epicardial was with 99% small diagonal stenosis - medical therapy  . COPD (chronic obstructive pulmonary disease)   . Hypertension   . Hyperlipidemia   . Tobacco use      Past Surgical History  Procedure Laterality Date  . Mouth surgery    . Cardiac catheterization  10/2011    [facility] [place]     Family History  Problem Relation Age of Onset  . Heart disease Brother     Died of MI at 54  . Diabetes Mother      History   Social History  . Marital Status: Divorced    Spouse Name: N/A    Number of Children: N/A  . Years of Education: N/A   Occupational History  . Not on file.   Social History Main Topics  . Smoking status: Current Every Day Smoker -- 0.25 packs/day for 40 years    Types: Cigarettes  . Smokeless tobacco: Never Used     Comment: smokes 7 ciggerettes daily for the past 2 months  . Alcohol Use: Yes     Comment: Occasional  . Drug Use: No  . Sexual Activity: Yes   Other Topics Concern  . Not on file   Social History Narrative  ROS A 10 point review of system was performed. It is negative other than that mentioned in the history of present illness.   PHYSICAL EXAM   BP 130/78 mmHg  Pulse 80  Ht 6' (1.829 m)  Wt 223 lb 6.4 oz (101.334 kg)  BMI 30.29 kg/m2 Constitutional: He is oriented to person, place, and time. He appears well-developed and well-nourished. No distress.  HENT: No nasal discharge.  Head: Normocephalic and atraumatic.  Eyes: Pupils are equal and round.  No discharge. Neck: Normal range of motion. Neck supple. No JVD present. No thyromegaly present.  Cardiovascular: Normal rate, regular rhythm, normal heart sounds. Exam reveals no gallop and no friction rub. No murmur heard.    Pulmonary/Chest: Effort normal and breath sounds normal. No stridor. No respiratory distress. He has no wheezes. He has no rales. He exhibits no tenderness.  Abdominal: Soft. Bowel sounds are normal. He exhibits no distension. There is no tenderness. There is no rebound and no guarding.  Musculoskeletal: Normal range of motion. He exhibits no edema and no tenderness.  Neurological: He is alert and oriented to person, place, and time. Coordination normal.  Skin: Skin is warm and dry. No rash noted. He is not diaphoretic. No erythema. No pallor.  Psychiatric: He has a normal mood and affect. His behavior is normal. Judgment and thought content normal.  Vascular: Femoral pulse: +2 on the right side and mildly diminished on the left side. Posterior tibial and dorsalis pedis are palpable on the right side and not the left side.      ASSESSMENT AND PLAN

## 2013-12-15 NOTE — Assessment & Plan Note (Signed)
The patient has severe lifestyle limiting left calf claudication likely due to SFA disease. ABI was moderately reduced at 0.85. I had a prolonged discussion with him about management options including attempted medical therapy and a walking program versus proceeding with angiography and possible endovascular intervention. The patient feels significantly limited by his claudication to the point of affecting his work. Thus, we decided to schedule abdominal aortogram with runoff and possible endovascular intervention. Risks of the procedure were extensively discussed as well as long-term patency with endovascular intervention.

## 2013-12-15 NOTE — Assessment & Plan Note (Signed)
Blood pressure is well controlled on current medications. 

## 2013-12-15 NOTE — Assessment & Plan Note (Signed)
He has no anginal symptoms. Continue medical therapy.

## 2013-12-15 NOTE — Addendum Note (Signed)
Addended by: Kathlyn Sacramento A on: 12/15/2013 11:22 AM   Modules accepted: Orders

## 2013-12-15 NOTE — Patient Instructions (Signed)
Your physician has requested that you have a peripheral vascular angiogram. This exam is performed at the hospital. During this exam IV contrast is used to look at arterial blood flow. Please review the information sheet given for details.  Your physician recommends that you continue on your current medications as directed. Please refer to the Current Medication list given to you today.  Your physician recommends that you have lab work today: BMP, CBC and PT/INR  

## 2013-12-15 NOTE — Assessment & Plan Note (Signed)
I had a prolonged discussion with him about the importance of smoking cessation especially with the presence of peripheral arterial disease and risk of progression to more advanced degree of leg ischemia with subsequent complications.

## 2013-12-16 ENCOUNTER — Emergency Department (HOSPITAL_COMMUNITY)
Admission: EM | Admit: 2013-12-16 | Discharge: 2013-12-17 | Disposition: A | Discharge status: Home / Self Care | Payer: PRIVATE HEALTH INSURANCE | Attending: Emergency Medicine | Admitting: Emergency Medicine

## 2013-12-16 ENCOUNTER — Emergency Department (HOSPITAL_COMMUNITY): Payer: PRIVATE HEALTH INSURANCE

## 2013-12-16 ENCOUNTER — Encounter (HOSPITAL_COMMUNITY): Payer: Self-pay | Admitting: Emergency Medicine

## 2013-12-16 DIAGNOSIS — I10 Essential (primary) hypertension: Secondary | ICD-10-CM | POA: Insufficient documentation

## 2013-12-16 DIAGNOSIS — Z7982 Long term (current) use of aspirin: Secondary | ICD-10-CM | POA: Diagnosis not present

## 2013-12-16 DIAGNOSIS — Z79899 Other long term (current) drug therapy: Secondary | ICD-10-CM | POA: Insufficient documentation

## 2013-12-16 DIAGNOSIS — E789 Disorder of lipoprotein metabolism, unspecified: Secondary | ICD-10-CM | POA: Diagnosis not present

## 2013-12-16 DIAGNOSIS — R3129 Other microscopic hematuria: Secondary | ICD-10-CM

## 2013-12-16 DIAGNOSIS — I2510 Atherosclerotic heart disease of native coronary artery without angina pectoris: Secondary | ICD-10-CM | POA: Diagnosis not present

## 2013-12-16 DIAGNOSIS — R1084 Generalized abdominal pain: Secondary | ICD-10-CM | POA: Diagnosis present

## 2013-12-16 DIAGNOSIS — R312 Other microscopic hematuria: Secondary | ICD-10-CM | POA: Insufficient documentation

## 2013-12-16 DIAGNOSIS — G4700 Insomnia, unspecified: Secondary | ICD-10-CM | POA: Insufficient documentation

## 2013-12-16 DIAGNOSIS — Z9889 Other specified postprocedural states: Secondary | ICD-10-CM | POA: Diagnosis not present

## 2013-12-16 DIAGNOSIS — Z9862 Peripheral vascular angioplasty status: Secondary | ICD-10-CM | POA: Insufficient documentation

## 2013-12-16 DIAGNOSIS — J449 Chronic obstructive pulmonary disease, unspecified: Secondary | ICD-10-CM | POA: Diagnosis not present

## 2013-12-16 DIAGNOSIS — R109 Unspecified abdominal pain: Secondary | ICD-10-CM

## 2013-12-16 LAB — CBC WITH DIFFERENTIAL/PLATELET
BASOS ABS: 0 10*3/uL (ref 0.0–0.1)
Basophils Relative: 0 % (ref 0–1)
Eosinophils Absolute: 0.3 10*3/uL (ref 0.0–0.7)
Eosinophils Relative: 4 % (ref 0–5)
HEMATOCRIT: 34.7 % — AB (ref 39.0–52.0)
HEMOGLOBIN: 12.6 g/dL — AB (ref 13.0–17.0)
LYMPHS PCT: 37 % (ref 12–46)
Lymphs Abs: 2.4 10*3/uL (ref 0.7–4.0)
MCH: 30.4 pg (ref 26.0–34.0)
MCHC: 36.3 g/dL — ABNORMAL HIGH (ref 30.0–36.0)
MCV: 83.8 fL (ref 78.0–100.0)
MONO ABS: 0.7 10*3/uL (ref 0.1–1.0)
Monocytes Relative: 11 % (ref 3–12)
Neutro Abs: 3.1 10*3/uL (ref 1.7–7.7)
Neutrophils Relative %: 48 % (ref 43–77)
Platelets: 238 10*3/uL (ref 150–400)
RBC: 4.14 MIL/uL — ABNORMAL LOW (ref 4.22–5.81)
RDW: 13.9 % (ref 11.5–15.5)
WBC: 6.4 10*3/uL (ref 4.0–10.5)

## 2013-12-16 LAB — COMPREHENSIVE METABOLIC PANEL
ALBUMIN: 3.5 g/dL (ref 3.5–5.2)
ALT: 18 U/L (ref 0–53)
AST: 20 U/L (ref 0–37)
Alkaline Phosphatase: 87 U/L (ref 39–117)
Anion gap: 8 (ref 5–15)
BUN: 11 mg/dL (ref 6–23)
CALCIUM: 8.8 mg/dL (ref 8.4–10.5)
CO2: 26 mEq/L (ref 19–32)
Chloride: 104 mEq/L (ref 96–112)
Creatinine, Ser: 1.11 mg/dL (ref 0.50–1.35)
GFR calc Af Amer: 84 mL/min — ABNORMAL LOW (ref >90)
GFR calc non Af Amer: 72 mL/min — ABNORMAL LOW (ref >90)
Glucose, Bld: 110 mg/dL — ABNORMAL HIGH (ref 70–99)
Potassium: 4.1 mEq/L (ref 3.7–5.3)
Sodium: 138 mEq/L (ref 137–147)
TOTAL PROTEIN: 7.4 g/dL (ref 6.0–8.3)
Total Bilirubin: 0.3 mg/dL (ref 0.3–1.2)

## 2013-12-16 LAB — LIPASE, BLOOD: Lipase: 30 U/L (ref 11–59)

## 2013-12-16 LAB — URINALYSIS, ROUTINE W REFLEX MICROSCOPIC
Bilirubin Urine: NEGATIVE
Glucose, UA: NEGATIVE mg/dL
Ketones, ur: NEGATIVE mg/dL
LEUKOCYTES UA: NEGATIVE
Nitrite: NEGATIVE
Protein, ur: NEGATIVE mg/dL
Specific Gravity, Urine: >1.03 — ABNORMAL HIGH (ref 1.005–1.030)
UROBILINOGEN UA: 0.2 mg/dL (ref 0.0–1.0)
pH: 5.5 (ref 5.0–8.0)

## 2013-12-16 LAB — URINE MICROSCOPIC-ADD ON

## 2013-12-16 LAB — CK: CK TOTAL: 272 U/L — AB (ref 7–232)

## 2013-12-16 MED ORDER — MORPHINE SULFATE 4 MG/ML IJ SOLN
4.0000 mg | Freq: Once | INTRAMUSCULAR | Status: AC
  Administered 2013-12-16: 4 mg via INTRAVENOUS
  Filled 2013-12-16: qty 1

## 2013-12-16 MED ORDER — IOHEXOL 300 MG/ML  SOLN
100.0000 mL | Freq: Once | INTRAMUSCULAR | Status: AC | PRN
  Administered 2013-12-16: 100 mL via INTRAVENOUS

## 2013-12-16 MED ORDER — IOHEXOL 300 MG/ML  SOLN
50.0000 mL | Freq: Once | INTRAMUSCULAR | Status: AC | PRN
  Administered 2013-12-16: 50 mL via ORAL

## 2013-12-16 MED ORDER — ONDANSETRON HCL 4 MG/2ML IJ SOLN
4.0000 mg | Freq: Once | INTRAMUSCULAR | Status: AC
  Administered 2013-12-16: 4 mg via INTRAVENOUS
  Filled 2013-12-16: qty 2

## 2013-12-16 MED ORDER — IOHEXOL 300 MG/ML  SOLN: 50.0000 mL | Freq: Once | INTRAMUSCULAR | Status: DC | PRN

## 2013-12-16 MED ORDER — SODIUM CHLORIDE 0.9 % IV BOLUS (SEPSIS)
500.0000 mL | Freq: Once | INTRAVENOUS | Status: AC
  Administered 2013-12-16: 500 mL via INTRAVENOUS

## 2013-12-16 NOTE — ED Provider Notes (Signed)
CSN: [id number]     Arrival date & time 12/16/13  2039 History  This chart was scribe for No att. providers found by Judithann Sauger, ED Scribe. The patient was seen in room APA14/APA14 and the patient's care was started at 9:06 PM.     Chief Complaint  Patient presents with  . Abdominal Pain   The history is provided by the patient. No language interpreter was used.   HPI Comments: Joshua Hall is a 58 y.o. male who presents to the Emergency Department complaining of a gradually worsening constant abdominal pain that intermittently radiates to his lower back onset 3 days ago at 1 pm. He states that he was able to sleep on the night of onset and the next morning he took medication but reports insomnia last night. He denies sneezing, CP, neck pain, and HA  He reports sweating and a slight cough. He denies having these same symptoms in the past and any other pain. He reports that he saw a Cardiologist yesterday for a stent placed in his left leg but did not discuss his current condition with him. He had a 20% blockage in his blood vessel 3 years ago. He states that he is a former smoker. He denies any abdominal surgeries. He drove to the ER today  Past Medical History  Diagnosis Date  . Colitis   . Coronary atherosclerosis of native coronary artery     Largely nonobstructive in major epicardial was with 99% small diagonal stenosis - medical therapy  . COPD (chronic obstructive pulmonary disease)   . Hypertension   . Hyperlipidemia   . Tobacco use    Past Surgical History  Procedure Laterality Date  . Mouth surgery    . Cardiac catheterization  10/2011    [facility] [place]   Family History  Problem Relation Age of Onset  . Heart disease Brother     Died of MI at 14  . Diabetes Mother    History  Substance Use Topics  . Smoking status: Current Every Day Smoker -- 0.25 packs/day for 40 years    Types: Cigarettes  . Smokeless tobacco: Never Used     Comment: smokes 7  ciggerettes daily for the past 2 months  . Alcohol Use: Yes     Comment: Occasional    Review of Systems  All other systems reviewed and are negative.     Allergies  Bee venom  Home Medications   Prior to Admission medications   Medication Sig Start Date End Date Taking? Authorizing Provider  aspirin EC 81 MG tablet Take 81 mg by mouth daily.   Yes Historical Provider, MD  metoprolol tartrate (LOPRESSOR) 25 MG tablet Take 1 tablet (25 mg total) by mouth 2 (two) times daily. 11/04/13 11/19/15 Yes Kathie Dike, MD  nitroGLYCERIN (NITRODUR - DOSED IN MG/24 HR) 0.2 mg/hr patch Place 0.2 mg onto the skin daily.   Yes Historical Provider, MD  pantoprazole (PROTONIX) 40 MG tablet Take 1 tablet (40 mg total) by mouth daily. 11/04/13  Yes Kathie Dike, MD  simvastatin (ZOCOR) 40 MG tablet Take 1 tablet (40 mg total) by mouth at bedtime. 11/04/13 12/30/14 Yes Kathie Dike, MD  albuterol (PROVENTIL HFA;VENTOLIN HFA) 108 (90 BASE) MCG/ACT inhaler Inhale 2 puffs into the lungs every 6 (six) hours as needed for wheezing or shortness of breath. 01/21/12   Kathie Dike, MD  nitroGLYCERIN (NITROSTAT) 0.4 MG SL tablet Place 1 tablet (0.4 mg total) under the tongue every 5 (five)  minutes as needed for chest pain. 10/20/11   Rhonda G Barrett, PA-C  oxyCODONE-acetaminophen (PERCOCET) 5-325 MG per tablet Take 1 tablet by mouth every 4 (four) hours as needed for moderate pain. 35/7/01   Delora Fuel, MD  oxyCODONE-acetaminophen (PERCOCET/ROXICET) 5-325 MG per tablet Take 1 tablet by mouth every 4 (four) hours as needed. 77/9/39   Delora Fuel, MD   BP 030/09 mmHg  Pulse 69  Temp(Src) 98.7 F (37.1 C) (Oral)  Resp 18  Ht 6' 1.5" (1.867 m)  Wt 223 lb (101.152 kg)  BMI 29.02 kg/m2  SpO2 91% Physical Exam  Constitutional: He is oriented to person, place, and time. He appears well-developed and well-nourished.  HENT:  Head: Normocephalic and atraumatic.  Right Ear: External ear normal.  Left Ear:  External ear normal.  Eyes: Conjunctivae and EOM are normal. Pupils are equal, round, and reactive to light.  Neck: Normal range of motion and phonation normal. Neck supple.  Cardiovascular: Normal rate, regular rhythm and normal heart sounds.   Pulmonary/Chest: Effort normal and breath sounds normal. He exhibits no bony tenderness.  Abdominal: Soft. There is no tenderness.  Not distended. Hypoactive bowel sounds. Mild diffuse tenderness with guarding.   Musculoskeletal: Normal range of motion.  Back is non tender.   Neurological: He is alert and oriented to person, place, and time. No cranial nerve deficit or sensory deficit. He exhibits normal muscle tone. Coordination normal.  Skin: Skin is warm, dry and intact.  Psychiatric: He has a normal mood and affect. His behavior is normal. Judgment and thought content normal.  Nursing note and vitals reviewed.   ED Course  Procedures (including critical care time) Medications  sodium chloride 0.9 % bolus 500 mL (0 mLs Intravenous Stopped 12/16/13 2258)  ondansetron (ZOFRAN) injection 4 mg (4 mg Intravenous Given 12/16/13 2210)  morphine 4 MG/ML injection 4 mg (4 mg Intravenous Given 12/16/13 2210)  morphine 4 MG/ML injection 4 mg (4 mg Intravenous Given 12/16/13 2258)  iohexol (OMNIPAQUE) 300 MG/ML solution 100 mL (100 mLs Intravenous Contrast Given 12/16/13 2347)  iohexol (OMNIPAQUE) 300 MG/ML solution 50 mL (50 mLs Oral Contrast Given 12/16/13 2347)   No data found.  9:12 PM- Pt advised of plan for treatment and pt agrees.  Labs Review Labs Reviewed  COMPREHENSIVE METABOLIC PANEL - Abnormal; Notable for the following:    Glucose, Bld 110 (*)    GFR calc non Af Amer 72 (*)    GFR calc Af Amer 84 (*)    All other components within normal limits  CBC WITH DIFFERENTIAL - Abnormal; Notable for the following:    RBC 4.14 (*)    Hemoglobin 12.6 (*)    HCT 34.7 (*)    MCHC 36.3 (*)    All other components within normal limits  URINALYSIS,  ROUTINE W REFLEX MICROSCOPIC - Abnormal; Notable for the following:    Specific Gravity, Urine >1.030 (*)    Hgb urine dipstick LARGE (*)    All other components within normal limits  CK - Abnormal; Notable for the following:    Total CK 272 (*)    All other components within normal limits  LIPASE, BLOOD  URINE MICROSCOPIC-ADD ON    Imaging Review No results found.   EKG Interpretation None      MDM   Final diagnoses:  Abdominal pain, unspecified abdominal location  Microscopic hematuria    Nonspecific abdominal pain with hematuria.  I personally performed the services described in this documentation, which  was scribed in my presence. The recorded information has been reviewed and is accurate.    Richarda Blade, MD 12/22/13 1020

## 2013-12-16 NOTE — ED Notes (Signed)
Patient reports diffuse abdominal pain since Monday. Also reports pain in mid back. Denies nausea, vomiting, or diarrhea.

## 2013-12-17 MED ORDER — OXYCODONE-ACETAMINOPHEN 5-325 MG PO TABS: 1.0000 | ORAL_TABLET | ORAL | Status: DC | PRN

## 2013-12-17 NOTE — ED Provider Notes (Signed)
Patient initially seen and evaluated by Dr. Eulis Foster for abdominal pain. WBC is normal with normal differential. Urinalysis had microscopic hematuria. He was signed out to me to evaluate results of CT scan. CT shows no acute process. Abdominal exam is benign. He is discharged with prescription for oxycodone-acetaminophen and is to follow-up with his PCP for recheck in 36 hours. Return to the ED if pain is worsening. Patient was advised of possible need for further outpatient workup of microscopic hematuria.  Results for orders placed or performed during the hospital encounter of 12/16/13  Comprehensive metabolic panel  Result Value Ref Range   Sodium 138 137 - 147 mEq/L   Potassium 4.1 3.7 - 5.3 mEq/L   Chloride 104 96 - 112 mEq/L   CO2 26 19 - 32 mEq/L   Glucose, Bld 110 (H) 70 - 99 mg/dL   BUN 11 6 - 23 mg/dL   Creatinine, Ser 1.11 0.50 - 1.35 mg/dL   Calcium 8.8 8.4 - 10.5 mg/dL   Total Protein 7.4 6.0 - 8.3 g/dL   Albumin 3.5 3.5 - 5.2 g/dL   AST 20 0 - 37 U/L   ALT 18 0 - 53 U/L   Alkaline Phosphatase 87 39 - 117 U/L   Total Bilirubin 0.3 0.3 - 1.2 mg/dL   GFR calc non Af Amer 72 (L) >90 mL/min   GFR calc Af Amer 84 (L) >90 mL/min   Anion gap 8 5 - 15  CBC with Differential  Result Value Ref Range   WBC 6.4 4.0 - 10.5 K/uL   RBC 4.14 (L) 4.22 - 5.81 MIL/uL   Hemoglobin 12.6 (L) 13.0 - 17.0 g/dL   HCT 34.7 (L) 39.0 - 52.0 %   MCV 83.8 78.0 - 100.0 fL   MCH 30.4 26.0 - 34.0 pg   MCHC 36.3 (H) 30.0 - 36.0 g/dL   RDW 13.9 11.5 - 15.5 %   Platelets 238 150 - 400 K/uL   Neutrophils Relative % 48 43 - 77 %   Neutro Abs 3.1 1.7 - 7.7 K/uL   Lymphocytes Relative 37 12 - 46 %   Lymphs Abs 2.4 0.7 - 4.0 K/uL   Monocytes Relative 11 3 - 12 %   Monocytes Absolute 0.7 0.1 - 1.0 K/uL   Eosinophils Relative 4 0 - 5 %   Eosinophils Absolute 0.3 0.0 - 0.7 K/uL   Basophils Relative 0 0 - 1 %   Basophils Absolute 0.0 0.0 - 0.1 K/uL  Lipase, blood  Result Value Ref Range   Lipase 30 11 - 59  U/L  Urinalysis, Routine w reflex microscopic  Result Value Ref Range   Color, Urine YELLOW YELLOW   APPearance CLEAR CLEAR   Specific Gravity, Urine >1.030 (H) 1.005 - 1.030   pH 5.5 5.0 - 8.0   Glucose, UA NEGATIVE NEGATIVE mg/dL   Hgb urine dipstick LARGE (A) NEGATIVE   Bilirubin Urine NEGATIVE NEGATIVE   Ketones, ur NEGATIVE NEGATIVE mg/dL   Protein, ur NEGATIVE NEGATIVE mg/dL   Urobilinogen, UA 0.2 0.0 - 1.0 mg/dL   Nitrite NEGATIVE NEGATIVE   Leukocytes, UA NEGATIVE NEGATIVE  Urine microscopic-add on  Result Value Ref Range   Squamous Epithelial / LPF RARE RARE   WBC, UA 0-2 <3 WBC/hpf   RBC / HPF 11-20 <3 RBC/hpf   Bacteria, UA RARE RARE  CK  Result Value Ref Range   Total CK 272 (H) 7 - 232 U/L   Ct Abdomen Pelvis W Contrast  12/17/2013   CLINICAL DATA:  Diffuse abdominal pain since 12/14/2013. History of colitis.  EXAM: CT ABDOMEN AND PELVIS WITH CONTRAST  TECHNIQUE: Multidetector CT imaging of the abdomen and pelvis was performed using the standard protocol following bolus administration of intravenous contrast.  CONTRAST:  59mL OMNIPAQUE IOHEXOL 300 MG/ML SOLN, 151mL OMNIPAQUE IOHEXOL 300 MG/ML SOLN  COMPARISON:  09/06/2009  FINDINGS: Dependent atelectasis at the lung bases. Negative for free intraperitoneal air.  Normal appearance of the liver, gallbladder and portal venous system. No acute abnormality in the pancreas. Normal appearance of the spleen and adrenal glands. Normal appearance of left kidney. There are at least 3 right renal cysts. 1.2 cm cystic structure along the medial right kidney is new from the previous examination. No significant free fluid or lymphadenopathy in the abdomen or pelvis.  Normal appearance of the urinary bladder and prostate. Appendix is normal. Normal appearance of the small and large bowel. There is marked sclerosis along the sacroiliac joints bilaterally and similar to the previous examination. No acute bone abnormality.  IMPRESSION: No acute  abnormality in the abdomen or pelvis.  Right renal cysts.   Electronically Signed   By: Markus Daft [name]   On: 12/17/2013 00:14   US Arterial Seg Single  12/02/2013   CLINICAL DATA:  Left leg and calf pain during walking for the past 8 months. Left lower extremity rest pain. History of smoking. History of hyperlipidemia and prior angioplasty.  EXAM: NONINVASIVE PHYSIOLOGIC VASCULAR STUDY OF BILATERAL LOWER EXTREMITIES  TECHNIQUE: Evaluation of both lower extremities were performed at rest, including calculation of ankle-brachial indices with single level Doppler, pressure and pulse volume recording.  COMPARISON:  None.  FINDINGS: Right ABI:  1.05  Left ABI:  0.85  ABI reference:  >1.2: Abnormal vessel hardening  1.0 - 1.2: Normal range  0.9 - 1.0: Acceptable  0.8 - 0.9: Mild arterial disease  0.5 - 0.8: Moderate arterial disease  <0.5: Severe arterial disease  IMPRESSION: 1. Findings worrisome for at least mild hemodynamically significant vasoocclusive disease affecting the left lower extremity on this resting examination. Further evaluation could be performed with CTA runoff as clinically indicated. 2. No definite evidence of hemodynamically significant narrowing affecting the right lower extremity on this resting examination.   Electronically Signed   By: Sandi Mariscal [name]   On: 12/02/2013 13:24      Delora Fuel, MD 40/10/27 2536

## 2013-12-17 NOTE — Discharge Instructions (Signed)
Your tests here tests did not show a clear cause for your pain. Please follow-up with your PCP to be rechecked. Return to the ED if your pain is getting worse.  Your urine did have a small amount of blood in it. Your PCP may want to run some additional tests to see why there is blood in the urine.   Abdominal Pain Many things can cause abdominal pain. Usually, abdominal pain is not caused by a disease and will improve without treatment. It can often be observed and treated at home. Your health care provider will do a physical exam and possibly order blood tests and X-rays to help determine the seriousness of your pain. However, in many cases, more time must pass before a clear cause of the pain can be found. Before that point, your health care provider may not know if you need more testing or further treatment. HOME CARE INSTRUCTIONS  Monitor your abdominal pain for any changes. The following actions may help to alleviate any discomfort you are experiencing:  Only take over-the-counter or prescription medicines as directed by your health care provider.  Do not take laxatives unless directed to do so by your health care provider.  Try a clear liquid diet (broth, tea, or water) as directed by your health care provider. Slowly move to a bland diet as tolerated. SEEK MEDICAL CARE IF:  You have unexplained abdominal pain.  You have abdominal pain associated with nausea or diarrhea.  You have pain when you urinate or have a bowel movement.  You experience abdominal pain that wakes you in the night.  You have abdominal pain that is worsened or improved by eating food.  You have abdominal pain that is worsened with eating fatty foods.  You have a fever. SEEK IMMEDIATE MEDICAL CARE IF:   Your pain does not go away within 2 hours.  You keep throwing up (vomiting).  Your pain is felt only in portions of the abdomen, such as the right side or the left lower portion of the abdomen.  You pass  bloody or black tarry stools. MAKE SURE YOU:  Understand these instructions.   Will watch your condition.   Will get help right away if you are not doing well or get worse.  Document Released: 11/08/2004 Document Revised: 02/03/2013 Document Reviewed: 10/08/2012 [facility] Patient Information 2015 [place], [place]. This information is not intended to replace advice given to you by your health care provider. Make sure you discuss any questions you have with your health care provider.  Acetaminophen; Oxycodone capsules What is this medicine? ACETAMINOPHEN; OXYCODONE (a set a MEE noe fen; ox i KOE done) is a pain reliever. It is used to treat mild to moderate pain. This medicine may be used for other purposes; ask your health care provider or pharmacist if you have questions. COMMON BRAND NAME(S): Tylox What should I tell my health care provider before I take this medicine? They need to know if you have any of these conditions: -brain tumor -Crohn's disease, inflammatory bowel disease, or ulcerative colitis -drug abuse or addiction -head injury -heart or circulation problems -if you often drink alcohol -kidney disease or problems going to the bathroom -liver disease -lung disease, asthma, or breathing problems -an unusual or allergic reaction to salicylates, acetaminophen, oxycodone, other opioid analgesics, other medicines, foods, dyes, or preservatives -pregnant or trying to get pregnant -breast-feeding How should I use this medicine? Take this medicine by mouth with a full glass of water. Follow the directions on  the prescription label. If the medicine upsets your stomach, take it with food or milk. Take your medicine at regular intervals. Do not take your medicine more often than directed. Talk to your pediatrician regarding the use of this medicine in children. Special care may be needed. Patients over 2 years old may have a stronger reaction and need a smaller dose. Overdosage:  If you think you have taken too much of this medicine contact a poison control center or emergency room at once. NOTE: This medicine is only for you. Do not share this medicine with others. What if I miss a dose? If you miss a dose, take it as soon as you can. If it is almost time for your next dose, take only that dose. Do not take double or extra doses. What may interact with this medicine? -alcohol -antihistamines -barbiturates like amobarbital, butalbital, butabarbital, methohexital, pentobarbital, phenobarbital, thiopental, and secobarbital -benztropine -drugs for bladder problems like solifenacin, trospium, oxybutynin, tolterodine, hyoscyamine, and methscopolamine -drugs for breathing problems like ipratropium and tiotropium -drugs for certain stomach or intestine problems like propantheline, homatropine methylbromide, glycopyrrolate, atropine, belladonna, and dicyclomine -general anesthetics like etomidate, ketamine, nitrous oxide, propofol, desflurane, enflurane, halothane, isoflurane, and sevoflurane -medicines for depression, anxiety, or psychotic disturbances -medicines for sleep -muscle relaxants -naltrexone -narcotic medicines (opiates) for pain -phenothiazines like perphenazine, thioridazine, chlorpromazine, mesoridazine, fluphenazine, prochlorperazine, promazine, and trifluoperazine -scopolamine -tramadol -trihexyphenidyl This list may not describe all possible interactions. Give your health care provider a list of all the medicines, herbs, non-prescription drugs, or dietary supplements you use. Also tell them if you smoke, drink alcohol, or use illegal drugs. Some items may interact with your medicine. What should I watch for while using this medicine? Tell your doctor or health care professional if your pain does not go away, if it gets worse, or if you have new or a different type of pain. You may develop tolerance to the medicine. Tolerance means that you will need a higher  dose of the medication for pain relief. Tolerance is normal and is expected if you take this medicine for a long time. Do not suddenly stop taking your medicine because you may develop a severe reaction. Your body becomes used to the medicine. This does NOT mean you are addicted. Addiction is a behavior related to getting and using a drug for a nonmedical reason. If you have pain, you have a medical reason to take pain medicine. Your doctor will tell you how much medicine to take. If your doctor wants you to stop the medicine, the dose will be slowly lowered over time to avoid any side effects. You may get drowsy or dizzy. Do not drive, use machinery, or do anything that needs mental alertness until you know how this medicine affects you. Do not stand or sit up quickly, especially if you are an older patient. This reduces the risk of dizzy or fainting spells. Alcohol may interfere with the effect of this medicine. Avoid alcoholic drinks. There are different types of narcotic medicines (opiates) for pain. If you take more than one type at the same time, you may have more side effects. Give your health care provider a list of all medicines you use. Your doctor will tell you how much medicine to take. Do not take more medicine than directed. Call emergency for help if you have problems breathing. The medicine will cause constipation. Try to have a bowel movement at least every 2 to 3 days. If you do not have a bowel  movement for 3 days, call your doctor or health care professional. Do not take Tylenol (acetaminophen) or medicines that have acetaminophen with this medicine. Too much acetaminophen can be very dangerous. Many non-prescription medicines contain acetaminophen. Always read the labels carefully to avoid taking more acetaminophen. What side effects may I notice from receiving this medicine? Side effects that you should report to your doctor or health care professional as soon as possible: -allergic  reactions like skin rash, itching or hives, swelling of the face, lips, or tongue -breathing difficulties, wheezing -confusion -light headedness or fainting spells -severe stomach pain -unusually weak or tired -yellowing of the skin or the whites of the eyes Side effects that usually do not require medical attention (report to your doctor or health care professional if they continue or are bothersome): -dizziness -drowsiness -nausea -vomiting This list may not describe all possible side effects. Call your doctor for medical advice about side effects. You may report side effects to FDA at 1-800-FDA-1088. Where should I keep my medicine? Keep out of the reach of children. This medicine can be abused. Keep your medicine in a safe place to protect it from theft. Do not share this medicine with anyone. Selling or giving away this medicine is dangerous and against the law. Store at room temperature between 15 and 30 degrees C (59 and 86 degrees F). Keep container tightly closed. Protect from light. This medicine may cause accidental overdose and death if it is taken by other adults, children, or pets. Flush any unused medicine down the toilet to reduce the chance of harm. Do not use the medicine after the expiration date. NOTE: This sheet is a summary. It may not cover all possible information. If you have questions about this medicine, talk to your doctor, pharmacist, or health care provider.  2015, [employer]. (2012-09-22 13:16:32)

## 2013-12-21 MED FILL — Oxycodone w/ Acetaminophen Tab 5-325 MG: ORAL | Qty: 6 | Status: AC

## 2013-12-23 ENCOUNTER — Ambulatory Visit (HOSPITAL_COMMUNITY)
Admission: RE | Admit: 2013-12-23 | Discharge: 2013-12-23 | Disposition: A | Discharge status: Home / Self Care | Payer: PRIVATE HEALTH INSURANCE | Source: Ambulatory Visit | Attending: Cardiovascular Disease | Admitting: Cardiovascular Disease

## 2013-12-23 ENCOUNTER — Encounter (HOSPITAL_COMMUNITY)
Admission: RE | Disposition: A | Discharge status: Home / Self Care | Payer: Self-pay | Source: Ambulatory Visit | Attending: Cardiovascular Disease

## 2013-12-23 ENCOUNTER — Other Ambulatory Visit: Payer: Self-pay | Admitting: Cardiovascular Disease

## 2013-12-23 DIAGNOSIS — I70212 Atherosclerosis of native arteries of extremities with intermittent claudication, left leg: Secondary | ICD-10-CM

## 2013-12-23 DIAGNOSIS — F17210 Nicotine dependence, cigarettes, uncomplicated: Secondary | ICD-10-CM | POA: Diagnosis not present

## 2013-12-23 DIAGNOSIS — E785 Hyperlipidemia, unspecified: Secondary | ICD-10-CM | POA: Insufficient documentation

## 2013-12-23 DIAGNOSIS — I2510 Atherosclerotic heart disease of native coronary artery without angina pectoris: Secondary | ICD-10-CM | POA: Insufficient documentation

## 2013-12-23 DIAGNOSIS — I70218 Atherosclerosis of native arteries of extremities with intermittent claudication, other extremity: Secondary | ICD-10-CM | POA: Diagnosis present

## 2013-12-23 DIAGNOSIS — I10 Essential (primary) hypertension: Secondary | ICD-10-CM | POA: Insufficient documentation

## 2013-12-23 DIAGNOSIS — K529 Noninfective gastroenteritis and colitis, unspecified: Secondary | ICD-10-CM | POA: Insufficient documentation

## 2013-12-23 DIAGNOSIS — J449 Chronic obstructive pulmonary disease, unspecified: Secondary | ICD-10-CM | POA: Diagnosis not present

## 2013-12-23 DIAGNOSIS — I739 Peripheral vascular disease, unspecified: Secondary | ICD-10-CM | POA: Insufficient documentation

## 2013-12-23 DIAGNOSIS — Z7982 Long term (current) use of aspirin: Secondary | ICD-10-CM | POA: Insufficient documentation

## 2013-12-23 HISTORY — PX: ABDOMINAL AORTAGRAM: SHX5454

## 2013-12-23 LAB — POCT ACTIVATED CLOTTING TIME: Activated Clotting Time: 208 seconds

## 2013-12-23 SURGERY — ABDOMINAL AORTAGRAM
Anesthesia: LOCAL

## 2013-12-23 MED ORDER — ACETAMINOPHEN 325 MG PO TABS
ORAL_TABLET | ORAL | Status: AC
  Filled 2013-12-23: qty 2

## 2013-12-23 MED ORDER — MIDAZOLAM HCL 2 MG/2ML IJ SOLN
INTRAMUSCULAR | Status: AC
  Filled 2013-12-23: qty 2

## 2013-12-23 MED ORDER — HEPARIN (PORCINE) IN NACL 2-0.9 UNIT/ML-% IJ SOLN
INTRAMUSCULAR | Status: AC
Start: 2013-12-23 — End: 2013-12-23
  Filled 2013-12-23: qty 1000

## 2013-12-23 MED ORDER — SODIUM CHLORIDE 0.9 % IV SOLN: 250.0000 mL | INTRAVENOUS | Status: DC | PRN

## 2013-12-23 MED ORDER — ACETAMINOPHEN 325 MG PO TABS
650.0000 mg | ORAL_TABLET | Freq: Once | ORAL | Status: AC
  Administered 2013-12-23: 650 mg via ORAL

## 2013-12-23 MED ORDER — HEPARIN SODIUM (PORCINE) 1000 UNIT/ML IJ SOLN
INTRAMUSCULAR | Status: AC
  Filled 2013-12-23: qty 1

## 2013-12-23 MED ORDER — ASPIRIN 81 MG PO CHEW: 81.0000 mg | CHEWABLE_TABLET | ORAL | Status: DC

## 2013-12-23 MED ORDER — CLOPIDOGREL BISULFATE 75 MG PO TABS: 75.0000 mg | ORAL_TABLET | Freq: Every day | ORAL | Status: DC

## 2013-12-23 MED ORDER — SODIUM CHLORIDE 0.9 % IV SOLN
INTRAVENOUS | Status: DC
  Administered 2013-12-23: 11:00:00 via INTRAVENOUS

## 2013-12-23 MED ORDER — SODIUM CHLORIDE 0.9 % IV SOLN: INTRAVENOUS | Status: DC

## 2013-12-23 MED ORDER — LIDOCAINE HCL (PF) 1 % IJ SOLN
INTRAMUSCULAR | Status: AC
  Filled 2013-12-23: qty 30

## 2013-12-23 MED ORDER — SODIUM CHLORIDE 0.9 % IJ SOLN: 3.0000 mL | INTRAMUSCULAR | Status: DC | PRN

## 2013-12-23 MED ORDER — SODIUM CHLORIDE 0.9 % IJ SOLN: 3.0000 mL | Freq: Two times a day (BID) | INTRAMUSCULAR | Status: DC

## 2013-12-23 MED ORDER — FENTANYL CITRATE 0.05 MG/ML IJ SOLN
INTRAMUSCULAR | Status: AC
  Filled 2013-12-23: qty 2

## 2013-12-23 NOTE — Progress Notes (Signed)
Rt hand PIV d/c site WNL. Gauze and tape applied

## 2013-12-23 NOTE — Discharge Instructions (Signed)
Start taking Plavix 75 mg once daily ( a prescription was sent to [facility]).  Angiogram, Care After Refer to this sheet in the next few weeks. These instructions provide you with information on caring for yourself after your procedure. Your health care provider may also give you more specific instructions. Your treatment has been planned according to current medical practices, but problems sometimes occur. Call your health care provider if you have any problems or questions after your procedure.  WHAT TO EXPECT AFTER THE PROCEDURE After your procedure, it is typical to have the following sensations:  Minor discomfort or tenderness and a small bump at the catheter insertion site. The bump should usually decrease in size and tenderness within 1 to 2 weeks.  Any bruising will usually fade within 2 to 4 weeks. HOME CARE INSTRUCTIONS   You may need to keep taking blood thinners if they were prescribed for you. Take medicines only as directed by your health care provider.  Do not apply powder or lotion to the site.  Do not take baths, swim, or use a hot tub until your health care provider approves.  You may shower 24 hours after the procedure. Remove the bandage (dressing) and gently wash the site with plain soap and water. Gently pat the site dry.  Inspect the site at least twice daily.  Limit your activity for the first 48 hours. Do not bend, squat, or lift anything over 20 lb (9 kg) or as directed by your health care provider.  Plan to have someone take you home after the procedure. Follow instructions about when you can drive or return to work. SEEK MEDICAL CARE IF:  You get light-headed when standing up.  You have drainage (other than a small amount of blood on the dressing).  You have chills.  You have a fever.  You have redness, warmth, swelling, or pain at the insertion site. SEEK IMMEDIATE MEDICAL CARE IF:   You develop chest pain or shortness of breath, feel faint, or pass  out.  You have bleeding, swelling larger than a walnut, or drainage from the catheter insertion site.  You develop pain, discoloration, coldness, or severe bruising in the leg or arm that held the catheter.  You develop bleeding from any other place, such as the bowels. You may see bright red blood in your urine or stools, or your stools may appear black and tarry.  You have heavy bleeding from the site. If this happens, hold pressure on the site. CALL 911 MAKE SURE YOU:  Understand these instructions.  Will watch your condition.  Will get help right away if you are not doing well or get worse. Document Released: 08/17/2004 Document Revised: 06/15/2013 Document Reviewed: 06/23/2012 [facility] Patient Information 2015 [place], [place]. This information is not intended to replace advice given to you by your health care provider. Make sure you discuss any questions you have with your health care provider.

## 2013-12-23 NOTE — H&P (View-Only) (Signed)
Primary care physician:Dr. Nevada Crane Primary cardiologist: Dr. Domenic Polite  HPI  Joshua Hall is a 58 y.o.malewho was referred for evaluation and management of recently diagnosed peripheral arterial disease. He has known history of coronary artery disease mainly affecting diagonal branch, treated medically. Most recent cardiac catheterization in 2013 demonstrating nonobstructive disease of the LAD, with a 99% stenosis to a very small second diagonal branch, too small for intervention, and mild nonobstructive disease of the RCA, the left circumflex was normal. He has chronic medical conditions that include tobacco use, hypertension and hyperlipidemia. He noticed progressive left calf discomfort with walking since March. It's described as cramping that happens after walking less than one block. It's usually severe and forces him to stop for 5 minutes before he can resume again. He has no rest pain or lower extremity ulceration. ABI was normal on the right side and 0.85 on the left side.  Allergies  Allergen Reactions  . Bee Venom      Current Outpatient Prescriptions on File Prior to Visit  Medication Sig Dispense Refill  . albuterol (PROVENTIL HFA;VENTOLIN HFA) 108 (90 BASE) MCG/ACT inhaler Inhale 2 puffs into the lungs every 6 (six) hours as needed for wheezing or shortness of breath. 1 Inhaler 0  . aspirin EC 81 MG tablet Take 81 mg by mouth daily.    . metoprolol tartrate (LOPRESSOR) 25 MG tablet Take 1 tablet (25 mg total) by mouth 2 (two) times daily. 60 tablet 1  . nitroGLYCERIN (NITRODUR - DOSED IN MG/24 HR) 0.2 mg/hr patch Place 0.2 mg onto the skin daily.    . nitroGLYCERIN (NITROSTAT) 0.4 MG SL tablet Place 1 tablet (0.4 mg total) under the tongue every 5 (five) minutes as needed for chest pain. 25 tablet 3  . pantoprazole (PROTONIX) 40 MG tablet Take 1 tablet (40 mg total) by mouth daily. 30 tablet 1  . simvastatin (ZOCOR) 40 MG tablet Take 1 tablet (40 mg total) by mouth at bedtime. 30  tablet 1   No current facility-administered medications on file prior to visit.     Past Medical History  Diagnosis Date  . Colitis   . Coronary atherosclerosis of native coronary artery     Largely nonobstructive in major epicardial was with 99% small diagonal stenosis - medical therapy  . COPD (chronic obstructive pulmonary disease)   . Hypertension   . Hyperlipidemia   . Tobacco use      Past Surgical History  Procedure Laterality Date  . Mouth surgery    . Cardiac catheterization  10/2011    [place] [facility]     Family History  Problem Relation Age of Onset  . Heart disease Brother     Died of MI at 78  . Diabetes Mother      History   Social History  . Marital Status: Divorced    Spouse Name: N/A    Number of Children: N/A  . Years of Education: N/A   Occupational History  . Not on file.   Social History Main Topics  . Smoking status: Current Every Day Smoker -- 0.25 packs/day for 40 years    Types: Cigarettes  . Smokeless tobacco: Never Used     Comment: smokes 7 ciggerettes daily for the past 2 months  . Alcohol Use: Yes     Comment: Occasional  . Drug Use: No  . Sexual Activity: Yes   Other Topics Concern  . Not on file   Social History Narrative  ROS A 10 point review of system was performed. It is negative other than that mentioned in the history of present illness.   PHYSICAL EXAM   BP 130/78 mmHg  Pulse 80  Ht 6' (1.829 m)  Wt 223 lb 6.4 oz (101.334 kg)  BMI 30.29 kg/m2 Constitutional: He is oriented to person, place, and time. He appears well-developed and well-nourished. No distress.  HENT: No nasal discharge.  Head: Normocephalic and atraumatic.  Eyes: Pupils are equal and round.  No discharge. Neck: Normal range of motion. Neck supple. No JVD present. No thyromegaly present.  Cardiovascular: Normal rate, regular rhythm, normal heart sounds. Exam reveals no gallop and no friction rub. No murmur heard.    Pulmonary/Chest: Effort normal and breath sounds normal. No stridor. No respiratory distress. He has no wheezes. He has no rales. He exhibits no tenderness.  Abdominal: Soft. Bowel sounds are normal. He exhibits no distension. There is no tenderness. There is no rebound and no guarding.  Musculoskeletal: Normal range of motion. He exhibits no edema and no tenderness.  Neurological: He is alert and oriented to person, place, and time. Coordination normal.  Skin: Skin is warm and dry. No rash noted. He is not diaphoretic. No erythema. No pallor.  Psychiatric: He has a normal mood and affect. His behavior is normal. Judgment and thought content normal.  Vascular: Femoral pulse: +2 on the right side and mildly diminished on the left side. Posterior tibial and dorsalis pedis are palpable on the right side and not the left side.      ASSESSMENT AND PLAN

## 2013-12-23 NOTE — Interval H&P Note (Signed)
History and Physical Interval Note:  12/23/2013 1:49 PM  Joshua Hall  has presented today for surgery, with the diagnosis of pvd  The various methods of treatment have been discussed with the patient and family. After consideration of risks, benefits and other options for treatment, the patient has consented to  Procedure(s): ABDOMINAL AORTAGRAM (N/A) as a surgical intervention .  The patient's history has been reviewed, patient examined, no change in status, stable for surgery.  I have reviewed the patient's chart and labs.  Questions were answered to the patient's satisfaction.     Kathlyn Sacramento

## 2013-12-23 NOTE — CV Procedure (Signed)
PERIPHERAL VASCULAR PROCEDURE  NAME:  Joshua Hall   MRN: [id number] DOB:  1955/08/09   ADMIT DATE: 12/23/2013  Performing Cardiologist: Kathlyn Sacramento Primary Physician: Delphina Cahill, MD Primary Cardiologist:  Dr. Domenic Polite   Procedures Performed:  Abdominal Aortic Angiogram with Bi-Iliofemoral Runoff  Second Order left Lower Extremity Angiography with Runoff  Left SFA self-expanding stent placement  Mynx closure device   Indication(s):   Claudication   Consent: The procedure with Risks/Benefits/Alternatives and Indications was reviewed with the patient .  All questions were answered.  Medications:  Sedation:  1 mg IV Versed, 100 mcg IV Fentanyl  Contrast:  101 mL  Visipaque   Procedural details: The right groin was prepped, draped, and anesthetized with 1% lidocaine. Using modified Seldinger technique, a 5 French sheath was introduced into the right common femoral artery. A 5 Fr Short Pigtail Catheter was advanced of over a  Versicore wire into the descending Aorta to a level just above the renal arteries. A power injection of 73m/sec contrast over 1 sec was performed for Abdominal Aortic Angiography.  The catheter was then pulled back to a level just above the Aortic bifurcation, and a second power injection was performed to evaluate the iliac arteries.   The pigtail catheter was changed over the Versicore wire for A crossover catheter which was then pulled back the aortic bifurcation and the wire was advanced down the contralateral common iliac artery.  The wire was then advanced to the contralateral common femoral artery, the catheter was exchanged into an end hole straight tip catheter which was advanced over the wire to the common femoral artery. Contralateral second-order left lower extremity angiography was performed via power injection of 5 ml / sec contrast for a total of 35 ml.  Interventional Procedure:  The 5 French sheath was exchanged into a 6 French 45 cm  destination sheath which was placed with its tip in the proximal left SFA. 7000 units of unfractionated heparin was given with an ACT of 208. The lesion was crossed with a versicore wire with some difficulty. The lesion has the appearance of a plaque rupture and thus I elected to direct stent. I used an 8 x 40 mm Smart self-expanding stent which was postdilated with an 8 x 40 mm balloon to 6 atm with excellent results and no residual stenosis. The sheath was then exchanged into a short 6 French sheath  Hemostasis was achieved with a  Mynx closure device. The patient tolerated the procedure well with no immediate complications.   Hemodynamics:  Central Aortic Pressure / Mean Aortic Pressure: 152/94  Findings:  Abdominal aorta: normal in size with no evidence of aneurysm or significant stenosis.  Left renal artery: normal  Right renal artery: normal  Celiac artery: patent  Superior mesenteric artery: patent  Right common iliac artery: minor irregularities.  Right internal iliac artery: 20% proximal stenosis.  Right external iliac artery: normal  Right common femoral artery: minor irregularities.  Left common iliac artery:  Minor irregularities.  Left internal iliac artery: 30% ostial stenosis  Left external iliac artery: normal  Left common femoral artery: normal  Left profunda femoral artery: normal  Left superficial femoral artery:  Normal in the proximal segment. There is 90% hazy stenosis in the mid to distal segment suggestive of plaque rupture.  Left popliteal artery: minor irregularities.  Three-vessel runoff below the knee.  Conclusions: 1. No significant aortoiliac disease. 2. Severe mid to distal left SFA stenosis with evidence of plaque  rupture. 3. Successful self-expanding stent placement to the left SFA.  Recommendations:  Continue dual antiplatelet therapy for at least one month.   Kathlyn Sacramento, MD, [facility] 12/23/2013 3:13 PM

## 2013-12-23 NOTE — Progress Notes (Addendum)
Pt c/o pain to right groin across lower abdomen to left leg 5/10. Rt groin level 0, lower abd soft,  VSS. Dr Fletcher Anon notified.

## 2013-12-23 NOTE — Progress Notes (Signed)
Dr Fletcher Anon at bedside to assess pt.  No new orders ok to d/c pt home as plan after bedrest.

## 2013-12-23 NOTE — Progress Notes (Signed)
Pt states he would like to go home/ headache is tolerable.

## 2013-12-28 ENCOUNTER — Telehealth: Payer: Self-pay | Admitting: Cardiovascular Disease

## 2013-12-28 NOTE — Telephone Encounter (Signed)
New Message:  Pt called in stating that he had a stent put in on 11/11 and was instructed to be back to work today but he says he is still sore and would like an extention. He says he would call back with the number to the fax number to fax the information to. Please call  Thanks

## 2013-12-28 NOTE — Telephone Encounter (Signed)
I spoke with the pt and he complains of soreness at his groin site. The pt would like to remain out of work until 12/31/13. I will speak with Dr Fletcher Anon when he is in the office tomorrow in regards to work note.

## 2013-12-28 NOTE — Telephone Encounter (Signed)
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    Fax # [phone] attention pat knowles

## 2013-12-29 NOTE — Telephone Encounter (Signed)
Okay to extend time out of work per Dr Fletcher Anon.  Letter completed and faxed per the pt's request.

## 2014-01-20 ENCOUNTER — Encounter (HOSPITAL_COMMUNITY): Payer: Self-pay | Admitting: Dermatopathology

## 2014-01-21 ENCOUNTER — Encounter (HOSPITAL_COMMUNITY): Payer: Self-pay | Admitting: Cardiology

## 2014-02-02 ENCOUNTER — Other Ambulatory Visit (HOSPITAL_COMMUNITY): Payer: Self-pay | Admitting: *Deleted

## 2014-02-02 DIAGNOSIS — I739 Peripheral vascular disease, unspecified: Secondary | ICD-10-CM

## 2014-02-08 ENCOUNTER — Ambulatory Visit (HOSPITAL_COMMUNITY): Payer: PRIVATE HEALTH INSURANCE | Attending: Cardiovascular Disease | Admitting: Cardiology

## 2014-02-08 ENCOUNTER — Other Ambulatory Visit (HOSPITAL_COMMUNITY): Payer: Self-pay | Admitting: Cardiology

## 2014-02-08 DIAGNOSIS — E785 Hyperlipidemia, unspecified: Secondary | ICD-10-CM | POA: Insufficient documentation

## 2014-02-08 DIAGNOSIS — Z720 Tobacco use: Secondary | ICD-10-CM | POA: Insufficient documentation

## 2014-02-08 DIAGNOSIS — Z4589 Encounter for adjustment and management of other implanted devices: Secondary | ICD-10-CM | POA: Insufficient documentation

## 2014-02-08 DIAGNOSIS — I10 Essential (primary) hypertension: Secondary | ICD-10-CM | POA: Diagnosis not present

## 2014-02-08 DIAGNOSIS — Z95828 Presence of other vascular implants and grafts: Secondary | ICD-10-CM | POA: Insufficient documentation

## 2014-02-08 DIAGNOSIS — I739 Peripheral vascular disease, unspecified: Secondary | ICD-10-CM | POA: Insufficient documentation

## 2014-02-08 DIAGNOSIS — I2510 Atherosclerotic heart disease of native coronary artery without angina pectoris: Secondary | ICD-10-CM | POA: Diagnosis not present

## 2014-02-08 NOTE — Progress Notes (Signed)
ABI performed  

## 2014-03-03 ENCOUNTER — Telehealth: Payer: Self-pay | Admitting: Cardiovascular Disease

## 2014-03-03 NOTE — Telephone Encounter (Signed)
New message     Calling to get test results----OK to leave on vm

## 2014-03-04 NOTE — Telephone Encounter (Signed)
Left detailed message on pt's voicemail with results and instructions to call for 2 month appointments (Lower arterial duplex and Office visit).

## 2014-03-19 ENCOUNTER — Other Ambulatory Visit: Payer: Self-pay

## 2014-03-19 DIAGNOSIS — I739 Peripheral vascular disease, unspecified: Secondary | ICD-10-CM

## 2014-03-23 ENCOUNTER — Other Ambulatory Visit: Payer: Self-pay | Admitting: Radiology

## 2014-03-23 DIAGNOSIS — I739 Peripheral vascular disease, unspecified: Secondary | ICD-10-CM

## 2014-04-20 ENCOUNTER — Ambulatory Visit (INDEPENDENT_AMBULATORY_CARE_PROVIDER_SITE_OTHER): Payer: PRIVATE HEALTH INSURANCE | Admitting: Cardiovascular Disease

## 2014-04-20 ENCOUNTER — Encounter: Payer: Self-pay | Admitting: Cardiovascular Disease

## 2014-04-20 ENCOUNTER — Ambulatory Visit (HOSPITAL_COMMUNITY): Payer: PRIVATE HEALTH INSURANCE | Attending: Cardiology | Admitting: *Deleted

## 2014-04-20 VITALS — BP 144/80 | HR 58 | Ht 72.0 in | Wt 211.0 lb

## 2014-04-20 DIAGNOSIS — I10 Essential (primary) hypertension: Secondary | ICD-10-CM | POA: Diagnosis not present

## 2014-04-20 DIAGNOSIS — F17210 Nicotine dependence, cigarettes, uncomplicated: Secondary | ICD-10-CM | POA: Diagnosis not present

## 2014-04-20 DIAGNOSIS — I739 Peripheral vascular disease, unspecified: Secondary | ICD-10-CM | POA: Diagnosis not present

## 2014-04-20 DIAGNOSIS — E785 Hyperlipidemia, unspecified: Secondary | ICD-10-CM | POA: Diagnosis not present

## 2014-04-20 DIAGNOSIS — I2510 Atherosclerotic heart disease of native coronary artery without angina pectoris: Secondary | ICD-10-CM | POA: Insufficient documentation

## 2014-04-20 DIAGNOSIS — I70202 Unspecified atherosclerosis of native arteries of extremities, left leg: Secondary | ICD-10-CM | POA: Diagnosis not present

## 2014-04-20 DIAGNOSIS — Z720 Tobacco use: Secondary | ICD-10-CM

## 2014-04-20 DIAGNOSIS — N522 Drug-induced erectile dysfunction: Secondary | ICD-10-CM | POA: Diagnosis not present

## 2014-04-20 MED ORDER — AMLODIPINE BESYLATE 5 MG PO TABS: 5.0000 mg | ORAL_TABLET | Freq: Every day | ORAL | Status: DC

## 2014-04-20 NOTE — Patient Instructions (Addendum)
Your physician wants you to follow-up in: 6 months with Dr Zannie Cove will receive a reminder letter in the mail two months in advance. If you don't receive a letter, please call our office to schedule the follow-up appointment.  Your physician has requested that you have a lower  extremity arterial duplex. This test is an ultrasound of the arteries in the legs or arms. It looks at arterial blood flow in the legs and arms. Allow one hour for Lower and Upper Arterial scans. There are no restrictions or special instructions---in 6 months with follow up sameday as appointment with [place] physician has recommended you make the following change in your medication:  1) Stop Plavix 2) Stop Metoprolol 3) Start Amlodipine 5 mg daily

## 2014-04-20 NOTE — Progress Notes (Signed)
Primary care physician:Dr. Nevada Crane Primary cardiologist: Dr. Domenic Polite  HPI  [name] is a 59 y.o.male who is here today for a follow-up visit regarding  peripheral arterial disease. He has known history of coronary artery disease mainly affecting diagonal branch, treated medically. Most recent cardiac catheterization in 2013 demonstrating nonobstructive disease of the LAD, with a 99% stenosis to a very small second diagonal branch, too small for intervention, and mild nonobstructive disease of the RCA, the left circumflex was normal. He has chronic medical conditions that include tobacco use, hypertension and hyperlipidemia. He was seen for progressive left calf claudication. ABI was normal on the right side and 0.85 on the left side. I proceeded with angiography in November 2015 which showed: 1. No significant aortoiliac disease. 2. Severe mid to distal left SFA stenosis with evidence of plaque rupture. 3. Successful self-expanding stent placement to the left SFA. He reports resolution of claudication since then. ABI post procedure was normal. Duplex today showed patent stent with no significant restenosis. His only complaint today is erectile dysfunction.   Allergies  Allergen Reactions  . Bee Venom      Current Outpatient Prescriptions on File Prior to Visit  Medication Sig Dispense Refill  . albuterol (PROVENTIL HFA;VENTOLIN HFA) 108 (90 BASE) MCG/ACT inhaler Inhale 2 puffs into the lungs every 6 (six) hours as needed for wheezing or shortness of breath. 1 Inhaler 0  . aspirin EC 81 MG tablet Take 81 mg by mouth daily.    . clopidogrel (PLAVIX) 75 MG tablet Take 1 tablet (75 mg total) by mouth daily. 30 tablet 3  . metoprolol tartrate (LOPRESSOR) 25 MG tablet Take 1 tablet (25 mg total) by mouth 2 (two) times daily. 60 tablet 1  . nitroGLYCERIN (NITRODUR - DOSED IN MG/24 HR) 0.2 mg/hr patch Place 0.2 mg onto the skin daily.    . nitroGLYCERIN (NITROSTAT) 0.4 MG SL tablet Place 1  tablet (0.4 mg total) under the tongue every 5 (five) minutes as needed for chest pain. 25 tablet 3  . oxyCODONE-acetaminophen (PERCOCET) 5-325 MG per tablet Take 1 tablet by mouth every 4 (four) hours as needed for moderate pain. 10 tablet 0  . oxyCODONE-acetaminophen (PERCOCET/ROXICET) 5-325 MG per tablet Take 1 tablet by mouth every 4 (four) hours as needed. 6 tablet 0  . pantoprazole (PROTONIX) 40 MG tablet Take 1 tablet (40 mg total) by mouth daily. 30 tablet 1  . simvastatin (ZOCOR) 40 MG tablet Take 1 tablet (40 mg total) by mouth at bedtime. 30 tablet 1   No current facility-administered medications on file prior to visit.     Past Medical History  Diagnosis Date  . Colitis   . Coronary atherosclerosis of native coronary artery     Largely nonobstructive in major epicardial was with 99% small diagonal stenosis - medical therapy  . COPD (chronic obstructive pulmonary disease)   . Hypertension   . Hyperlipidemia   . Tobacco use      Past Surgical History  Procedure Laterality Date  . Mouth surgery    . Cardiac catheterization  10/2011    [facility]  . Left heart catheterization with coronary angiogram N/A 10/19/2011    Procedure: LEFT HEART CATHETERIZATION WITH CORONARY ANGIOGRAM;  Surgeon: [name], MD;  Location: [facility] C[facility] LAB;  Service: Cardiovascular;  Laterality: N/A;  . Abdominal aortagram N/A 12/23/2013    Procedure: ABDOMINAL Maxcine Ham;  Surgeon: [name], MD;  Location: [place] CATH LAB;  Service: Cardiovascular;  Laterality: N/A;     Family History  Problem Relation Age of Onset  . Heart disease Brother     Died of MI at 13  . Diabetes Mother      History   Social History  . Marital Status: Divorced    Spouse Name: N/A  . Number of Children: N/A  . Years of Education: N/A   Occupational History  . Not on file.   Social History Main Topics  . Smoking status: Current Every Day Smoker -- 0.25 packs/day for 40 years    Types: Cigarettes   . Smokeless tobacco: Never Used     Comment: smokes 7 ciggerettes daily for the past 2 months  . Alcohol Use: Yes     Comment: Occasional  . Drug Use: No  . Sexual Activity: Yes   Other Topics Concern  . Not on file   Social History Narrative     ROS A 10 point review of system was performed. It is negative other than that mentioned in the history of present illness.   PHYSICAL EXAM   BP 144/80 mmHg  Pulse 58  Ht 6' (1.829 m)  Wt 211 lb (95.709 kg)  BMI 28.61 kg/m2 Constitutional: He is oriented to person, place, and time. He appears well-developed and well-nourished. No distress.  HENT: No nasal discharge.  Head: Normocephalic and atraumatic.  Eyes: Pupils are equal and round.  No discharge. Neck: Normal range of motion. Neck supple. No JVD present. No thyromegaly present.  Cardiovascular: Normal rate, regular rhythm, normal heart sounds. Exam reveals no gallop and no friction rub. No murmur heard.  Pulmonary/Chest: Effort normal and breath sounds normal. No stridor. No respiratory distress. He has no wheezes. He has no rales. He exhibits no tenderness.  Abdominal: Soft. Bowel sounds are normal. He exhibits no distension. There is no tenderness. There is no rebound and no guarding.  Musculoskeletal: Normal range of motion. He exhibits no edema and no tenderness.  Neurological: He is alert and oriented to person, place, and time. Coordination normal.  Skin: Skin is warm and dry. No rash noted. He is not diaphoretic. No erythema. No pallor.  Psychiatric: He has a normal mood and affect. His behavior is normal. Judgment and thought content normal.  Vascular: Femoral pulse: +2 on the right side and mildly diminished on the left side. Posterior tibial and dorsalis pedis are palpable on both sides      ASSESSMENT AND PLAN

## 2014-04-20 NOTE — Progress Notes (Signed)
Lower Extremity Arterial Doppler Exam - Limited - Left + ABI  Performed

## 2014-04-25 ENCOUNTER — Encounter: Payer: Self-pay | Admitting: Cardiovascular Disease

## 2014-04-25 DIAGNOSIS — N522 Drug-induced erectile dysfunction: Secondary | ICD-10-CM | POA: Insufficient documentation

## 2014-04-25 NOTE — Assessment & Plan Note (Signed)
This could be related to treatment with metoprolol. Thus, I discontinued this and switched her from amlodipine. Treatment with a phosphodiesterase inhibitor can be considered if there is no improvement.

## 2014-04-25 NOTE — Assessment & Plan Note (Signed)
He is doing very well after left SFA stenting with resolution of claudication and abnormal ABI. Continue medical therapy. I discontinued Plavix today. Repeat lower extremity arterial duplex in 6 months from now.

## 2014-04-25 NOTE — Assessment & Plan Note (Addendum)
I advised him to quit smoking. He is down to 2 cigarettes a day.

## 2014-04-25 NOTE — Assessment & Plan Note (Signed)
I switched metoprolol to amlodipine as outlined above.

## 2014-10-27 ENCOUNTER — Other Ambulatory Visit: Payer: Self-pay | Admitting: Cardiovascular Disease

## 2014-10-27 DIAGNOSIS — I739 Peripheral vascular disease, unspecified: Secondary | ICD-10-CM

## 2014-10-29 ENCOUNTER — Ambulatory Visit (HOSPITAL_COMMUNITY)
Admission: RE | Admit: 2014-10-29 | Discharge: 2014-10-29 | Disposition: A | Discharge status: Home / Self Care | Payer: PRIVATE HEALTH INSURANCE | Source: Ambulatory Visit | Attending: Cardiovascular Disease | Admitting: Cardiovascular Disease

## 2014-10-29 DIAGNOSIS — E785 Hyperlipidemia, unspecified: Secondary | ICD-10-CM | POA: Diagnosis not present

## 2014-10-29 DIAGNOSIS — I10 Essential (primary) hypertension: Secondary | ICD-10-CM | POA: Insufficient documentation

## 2014-10-29 DIAGNOSIS — I739 Peripheral vascular disease, unspecified: Secondary | ICD-10-CM | POA: Insufficient documentation

## 2014-10-29 DIAGNOSIS — I2510 Atherosclerotic heart disease of native coronary artery without angina pectoris: Secondary | ICD-10-CM | POA: Diagnosis not present

## 2014-10-29 DIAGNOSIS — I70202 Unspecified atherosclerosis of native arteries of extremities, left leg: Secondary | ICD-10-CM | POA: Insufficient documentation

## 2014-11-01 ENCOUNTER — Encounter: Payer: Self-pay | Admitting: *Deleted

## 2014-11-02 ENCOUNTER — Encounter: Payer: Self-pay | Admitting: Cardiovascular Disease

## 2014-11-02 ENCOUNTER — Ambulatory Visit (INDEPENDENT_AMBULATORY_CARE_PROVIDER_SITE_OTHER): Payer: PRIVATE HEALTH INSURANCE | Admitting: Cardiovascular Disease

## 2014-11-02 VITALS — BP 136/90 | HR 69 | Ht 73.0 in | Wt 208.8 lb

## 2014-11-02 DIAGNOSIS — N522 Drug-induced erectile dysfunction: Secondary | ICD-10-CM

## 2014-11-02 DIAGNOSIS — I2510 Atherosclerotic heart disease of native coronary artery without angina pectoris: Secondary | ICD-10-CM

## 2014-11-02 DIAGNOSIS — I739 Peripheral vascular disease, unspecified: Secondary | ICD-10-CM

## 2014-11-02 DIAGNOSIS — I10 Essential (primary) hypertension: Secondary | ICD-10-CM | POA: Diagnosis not present

## 2014-11-02 DIAGNOSIS — Z720 Tobacco use: Secondary | ICD-10-CM

## 2014-11-02 MED ORDER — AMLODIPINE BESYLATE 5 MG PO TABS: 5.0000 mg | ORAL_TABLET | Freq: Every day | ORAL | Status: DC

## 2014-11-02 NOTE — Assessment & Plan Note (Signed)
Recent noninvasive vascular evaluation showed normal ABIs with moderate in-stent restenosis in the left SFA. He currently has no claudication. Continue observation and treatment of risk factors. Repeat lower extremity arterial duplex in 6 months given the presence of moderate in-stent restenosis.

## 2014-11-02 NOTE — Assessment & Plan Note (Signed)
He has no symptoms suggestive of angina. Continue medical therapy. 

## 2014-11-02 NOTE — Assessment & Plan Note (Signed)
I strongly advised him to quit smoking completely in order to prevent further progression of in-stent restenosis and peripheral arterial disease.

## 2014-11-02 NOTE — Patient Instructions (Signed)
Medication Instructions:  Your physician recommends that you continue on your current medications as directed. Please refer to the Current Medication list given to you today.  Labwork: No new orders.   Testing/Procedures: Your physician has requested that you have a lower extremity arterial duplex in 6 MONTHS. This test is an ultrasound of the arteries in the legs. It looks at arterial blood flow in the legs. Allow one hour for Lower Arterial scans. There are no restrictions or special instructions  Follow-Up: Your physician wants you to follow-up in: 6 MONTHS with Dr Fletcher Anon.  You will receive a reminder letter in the mail two months in advance. If you don't receive a letter, please call our office to schedule the follow-up appointment.   Any Other Special Instructions Will Be Listed Below (If Applicable).

## 2014-11-02 NOTE — Progress Notes (Signed)
Primary care physician:Dr. Nevada Crane Primary cardiologist: Dr. Domenic Polite  HPI  [name] is a 59 y.o.male who is here today for a follow-up visit regarding  peripheral arterial disease. He has known history of coronary artery disease mainly affecting diagonal branch, treated medically. Most recent cardiac catheterization in 2013 demonstrating nonobstructive disease of the LAD, with a 99% stenosis to a very small second diagonal branch, too small for intervention, and mild nonobstructive disease of the RCA, the left circumflex was normal. He has chronic medical conditions that include tobacco use, hypertension and hyperlipidemia. He was seen for progressive left calf claudication. ABI was normal on the right side and 0.85 on the left side. I proceeded with angiography in November 2015 which showed: 1. No significant aortoiliac disease. 2. Severe mid to distal left SFA stenosis with evidence of plaque rupture. 3. Successful self-expanding stent placement to the left SFA. He reports resolution of claudication since then.  During last visit, I switched metoprolol to amlodipine due to erectile dysfunction. He reports complete resolution of  ED. He denies any chest pain or shortness of breath.  Allergies  Allergen Reactions  . Bee Venom Swelling     Current Outpatient Prescriptions on File Prior to Visit  Medication Sig Dispense Refill  . albuterol (PROVENTIL HFA;VENTOLIN HFA) 108 (90 BASE) MCG/ACT inhaler Inhale 2 puffs into the lungs every 6 (six) hours as needed for wheezing or shortness of breath. 1 Inhaler 0  . amLODipine (NORVASC) 5 MG tablet Take 1 tablet (5 mg total) by mouth daily. 90 tablet 3  . aspirin EC 81 MG tablet Take 81 mg by mouth daily.    . nitroGLYCERIN (NITRODUR - DOSED IN MG/24 HR) 0.2 mg/hr patch Place 0.2 mg onto the skin daily.    . nitroGLYCERIN (NITROSTAT) 0.4 MG SL tablet Place 0.4 mg under the tongue every 5 (five) minutes as needed for chest pain (3 doses MAX).      [name] oxyCODONE-acetaminophen (PERCOCET) 5-325 MG per tablet Take 1 tablet by mouth every 4 (four) hours as needed for moderate pain. 10 tablet 0  . pantoprazole (PROTONIX) 40 MG tablet Take 1 tablet (40 mg total) by mouth daily. 30 tablet 1  . simvastatin (ZOCOR) 40 MG tablet Take 1 tablet (40 mg total) by mouth at bedtime. 30 tablet 1   No current facility-administered medications on file prior to visit.     Past Medical History  Diagnosis Date  . Colitis   . Coronary atherosclerosis of native coronary artery     Largely nonobstructive in major epicardial was with 99% small diagonal stenosis - medical therapy  . COPD (chronic obstructive pulmonary disease)   . Hypertension   . Hyperlipidemia   . Tobacco use      Past Surgical History  Procedure Laterality Date  . Mouth surgery    . Cardiac catheterization  10/2011    [facility]  . Left heart catheterization with coronary angiogram N/A 10/19/2011    Procedure: LEFT HEART CATHETERIZATION WITH CORONARY ANGIOGRAM;  Surgeon: [name], MD;  Location: [facility] CATH LAB;  Service: Cardiovascular;  Laterality: N/A;  . Abdominal aortagram N/A 12/23/2013    Procedure: ABDOMINAL Maxcine Ham;  Surgeon: [name], MD;  Location: [place] CATH LAB;  Service: Cardiovascular;  Laterality: N/A;     Family History  Problem Relation Age of Onset  . Heart disease Brother     Died of MI at 14  . Diabetes Mother      Social  History   Social History  . Marital Status: Divorced    Spouse Name: N/A  . Number of Children: N/A  . Years of Education: N/A   Occupational History  . Not on file.   Social History Main Topics  . Smoking status: Current Every Day Smoker -- 0.25 packs/day for 40 years    Types: Cigarettes  . Smokeless tobacco: Never Used     Comment: smokes 7 ciggerettes daily for the past 2 months  . Alcohol Use: Yes     Comment: Occasional  . Drug Use: No  . Sexual Activity: Yes   Other Topics Concern  . Not on file    Social History Narrative     ROS A 10 point review of system was performed. It is negative other than that mentioned in the history of present illness.   PHYSICAL EXAM   BP 136/90 mmHg  Pulse 69  Ht 6\' 1"  (1.854 m)  Wt 208 lb 12.8 oz (94.711 kg)  BMI 27.55 kg/m2 Constitutional: He is oriented to person, place, and time. He appears well-developed and well-nourished. No distress.  HENT: No nasal discharge.  Head: Normocephalic and atraumatic.  Eyes: Pupils are equal and round.  No discharge. Neck: Normal range of motion. Neck supple. No JVD present. No thyromegaly present.  Cardiovascular: Normal rate, regular rhythm, normal heart sounds. Exam reveals no gallop and no friction rub. No murmur heard.  Pulmonary/Chest: Effort normal and breath sounds normal. No stridor. No respiratory distress. He has no wheezes. He has no rales. He exhibits no tenderness.  Abdominal: Soft. Bowel sounds are normal. He exhibits no distension. There is no tenderness. There is no rebound and no guarding.  Musculoskeletal: Normal range of motion. He exhibits no edema and no tenderness.  Neurological: He is alert and oriented to person, place, and time. Coordination normal.  Skin: Skin is warm and dry. No rash noted. He is not diaphoretic. No erythema. No pallor.  Psychiatric: He has a normal mood and affect. His behavior is normal. Judgment and thought content normal.  Vascular: Femoral pulse: +2 on the right side and mildly diminished on the left side. Posterior tibial and dorsalis pedis are palpable on both sides    EKG: Normal sinus rhythm with no significant ST or T wave changes.  ASSESSMENT AND PLAN

## 2014-11-02 NOTE — Assessment & Plan Note (Signed)
This resolved after switching metoprolol to amlodipine.

## 2015-02-10 ENCOUNTER — Telehealth: Payer: Self-pay

## 2015-02-10 NOTE — Telephone Encounter (Signed)
Pt is calling to see if he can get in sooner to see someone about his stomach. He has an appointment on 03/02/15 with [facility]. He is hurting around his belly button. His pain level is at a 7 when the pain hits. His bowels are loose and it started about 2 weeks ago. Please advise

## 2015-02-15 ENCOUNTER — Other Ambulatory Visit: Payer: Self-pay

## 2015-02-15 DIAGNOSIS — R109 Unspecified abdominal pain: Secondary | ICD-10-CM

## 2015-02-15 NOTE — Telephone Encounter (Signed)
PT is aware to get labs today. Lab order was faxed to [facility]. I scheduled him an OV ( regular slot available) with Walden Field, NP on 02/17/2015 at 10:00 Am.  He is aware Ginger or Candy will call to schedule the CT.

## 2015-02-15 NOTE — Telephone Encounter (Signed)
PLEASE PUT PT IN AN URGENT VISIT THIS WEEK OR IF NO APPT ARE AVAILABLE PT CAN BE SEEN BY ME JAN 12 AT 1130. HE NEEDS A CT ABD/PELVIS W/ IV AND ORAL CONTRAST THIS WEEK, DX: ABDOMINAL PAIN AND DIARRHEA. HE SHOULD HAVE A BMP PRIOR TO CT.

## 2015-02-16 ENCOUNTER — Other Ambulatory Visit: Payer: Self-pay

## 2015-02-16 DIAGNOSIS — R197 Diarrhea, unspecified: Secondary | ICD-10-CM

## 2015-02-16 DIAGNOSIS — R109 Unspecified abdominal pain: Secondary | ICD-10-CM

## 2015-02-16 NOTE — Telephone Encounter (Signed)
Insurance is pending 

## 2015-02-17 ENCOUNTER — Encounter: Payer: Self-pay | Admitting: Nurse Practitioner

## 2015-02-17 ENCOUNTER — Ambulatory Visit (INDEPENDENT_AMBULATORY_CARE_PROVIDER_SITE_OTHER): Payer: PRIVATE HEALTH INSURANCE | Admitting: Nurse Practitioner

## 2015-02-17 VITALS — BP 161/80 | HR 79 | Temp 97.0°F | Ht 72.0 in | Wt 208.6 lb

## 2015-02-17 DIAGNOSIS — R1033 Periumbilical pain: Secondary | ICD-10-CM

## 2015-02-17 DIAGNOSIS — R197 Diarrhea, unspecified: Secondary | ICD-10-CM

## 2015-02-17 NOTE — Patient Instructions (Signed)
1. Have your labs drawn. 2. We will check on your CT insurance authorization today. As soon as we get word back from the insurance company we'll call you to schedule. 3. Keep your appointment with Dr. Oneida Alar in a couple weeks. 4. As we discussed if you have any emergent symptoms proceed to the emergency room.

## 2015-02-17 NOTE — Progress Notes (Signed)
Referring Provider: Celene Squibb, MD Primary Care Physician:  Wende Neighbors, MD Primary GI:  Dr. Oneida Alar  Chief Complaint  Patient presents with  . Follow-up    HPI:   60 year old male presents for follow-up on abdominal pain and diarrhea. He called our office on 02/10/2015 asking for sooner appointment than his previously scheduled due to pain on his belly button and loose stools all which began 2 weeks ago. He was scheduled for an urgent visit and orders put in 4 CT of the abdomen and pelvis with IV and oral contrast and a BMP. His labs aren't process, insurance is pending on the CT of the abdomen and pelvis. Last colonoscopy 2011 with 1 cecal polyp removed which was tubular adenoma. On recall for repeat in 2021.  Today he states he had an episode of colitis 5 years ago and polypectomy at the time on colonoscopy. Just before Christmas noted "a twitch" which became progressive and resulted in 7/10 pain. 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 Pain is intermittent and initially occurred every 20 minutes and lasts 30 sec to a minute and then eased off. Now it is lasting longer (as much as 15 minutes.) Denies N/V, hematochezia, melena, unintentional weight loss. Has also had increased stools up to 2-3 times a day, were loose last week but have begun to solidify. His baseline is daily formed, soft bowel movements. Denies fever, chills. Denies chest pain, dyspnea, dizziness, lightheadedness, syncope, near syncope. Denies any other upper or lower GI symptoms.  Past Medical History  Diagnosis Date  . Colitis   . Coronary atherosclerosis of native coronary artery     Largely nonobstructive in major epicardial was with 99% small diagonal stenosis - medical therapy  . COPD (chronic obstructive pulmonary disease) ([place])   . Hypertension   . Hyperlipidemia   . Tobacco use      Past Surgical History  Procedure Laterality Date  . Mouth surgery    . Cardiac catheterization  10/2011    [facility]  . Left heart catheterization with coronary angiogram N/A 10/19/2011    Procedure: LEFT HEART CATHETERIZATION WITH CORONARY ANGIOGRAM;  Surgeon: [name], MD;  Location: [facility] CATH LAB;  Service: Cardiovascular;  Laterality: N/A;  . Abdominal aortagram N/A 12/23/2013    Procedure: ABDOMINAL Maxcine Ham;  Surgeon: [name], MD;  Location: [place] CATH LAB;  Service: Cardiovascular;  Laterality: N/A;    Current Outpatient Prescriptions  Medication Sig Dispense Refill  . albuterol (PROVENTIL HFA;VENTOLIN HFA) 108 (90 BASE) MCG/ACT inhaler Inhale 2 puffs into the lungs every 6 (six) hours as needed for wheezing or shortness of breath. 1 Inhaler 0  . amLODipine (NORVASC) 5 MG tablet Take 1 tablet (5 mg total) by mouth daily. 90 tablet 3  . aspirin EC 81 MG tablet Take 81 mg by mouth daily.    [name] EPIPEN 2-PAK 0.3 MG/0.3ML SOAJ injection Inject 0.3 mg as directed daily as needed. Allergic reaction  1  . nitroGLYCERIN (NITRODUR - DOSED IN MG/24 HR) 0.2 mg/hr patch Place 0.2 mg onto the skin daily.    . nitroGLYCERIN (NITROSTAT) 0.4 MG SL tablet Place 0.4 mg under the tongue every 5 (five) minutes as needed for chest pain (3 doses MAX).    [name] oxyCODONE-acetaminophen (PERCOCET) 5-325 MG per tablet Take 1 tablet by mouth every 4 (  four) hours as needed for moderate pain. 10 tablet 0  . pantoprazole (PROTONIX) 40 MG tablet Take 1 tablet (40 mg total) by mouth daily. 30 tablet 1  . traMADol (ULTRAM) 50 MG tablet TK 1 T PO Q 6 H  0  . simvastatin (ZOCOR) 40 MG tablet Take 1 tablet (40 mg total) by mouth at bedtime. 30 tablet 1   No current facility-administered medications for this visit.    Allergies as of 02/17/2015 - Review Complete 02/17/2015  Allergen Reaction Noted  . Bee venom Swelling 11/04/2013    Family History  Problem Relation Age of Onset  . Heart  disease Brother     Died of MI at 76  . Diabetes Mother     Social History   Social History  . Marital Status: Divorced    Spouse Name: N/A  . Number of Children: N/A  . Years of Education: N/A   Social History Main Topics  . Smoking status: Current Every Day Smoker -- 0.25 packs/day for 40 years    Types: Cigarettes  . Smokeless tobacco: Never Used     Comment: smokes 7 ciggerettes daily for the past 2 months  . Alcohol Use: Yes     Comment: Occasional  . Drug Use: No  . Sexual Activity: Yes   Other Topics Concern  . None   Social History Narrative    Review of Systems: 10-point ROS negative except as per HPI.   Physical Exam: BP 161/80 mmHg  Pulse 79  Temp(Src) 97 F (36.1 C)  Ht 6' (1.829 m)  Wt 208 lb 9.6 oz (94.62 kg)  BMI 28.28 kg/m2 General:   Alert and oriented. Pleasant and cooperative. Well-nourished and well-developed.  Head:  Normocephalic and atraumatic. Eyes:  Without icterus, sclera clear and conjunctiva pink.  Ears:  Normal auditory acuity. Cardiovascular:  S1, S2 present without murmurs appreciated. Extremities without clubbing or edema. Respiratory:  Clear to auscultation bilaterally. No wheezes, rales, or rhonchi. No distress.  Gastrointestinal:  +BS, soft, and non-distended. Mild periumbilical/lower abdominal TTP. No HSM noted. No guarding or rebound. No masses appreciated.  Rectal:  Deferred  Skin:  Intact without significant lesions or rashes. Neurologic:  Alert and oriented x4;  grossly normal neurologically. Psych:  Alert and cooperative. Normal mood and affect. Heme/Lymph/Immune: No excessive bruising noted.    02/17/2015 10:34 AM

## 2015-02-17 NOTE — Telephone Encounter (Signed)
Insurance is still pending 

## 2015-02-18 NOTE — Telephone Encounter (Signed)
Insurance is still pending 

## 2015-02-21 ENCOUNTER — Encounter (HOSPITAL_COMMUNITY): Payer: Self-pay | Admitting: Emergency Medicine

## 2015-02-21 ENCOUNTER — Emergency Department (HOSPITAL_COMMUNITY)
Admission: EM | Admit: 2015-02-21 | Discharge: 2015-02-21 | Disposition: A | Discharge status: Home / Self Care | Payer: PRIVATE HEALTH INSURANCE | Attending: Emergency Medicine | Admitting: Emergency Medicine

## 2015-02-21 ENCOUNTER — Emergency Department (HOSPITAL_COMMUNITY): Payer: PRIVATE HEALTH INSURANCE

## 2015-02-21 DIAGNOSIS — Z8719 Personal history of other diseases of the digestive system: Secondary | ICD-10-CM | POA: Insufficient documentation

## 2015-02-21 DIAGNOSIS — F17210 Nicotine dependence, cigarettes, uncomplicated: Secondary | ICD-10-CM | POA: Insufficient documentation

## 2015-02-21 DIAGNOSIS — Z7982 Long term (current) use of aspirin: Secondary | ICD-10-CM | POA: Diagnosis not present

## 2015-02-21 DIAGNOSIS — R112 Nausea with vomiting, unspecified: Secondary | ICD-10-CM

## 2015-02-21 DIAGNOSIS — R1033 Periumbilical pain: Secondary | ICD-10-CM | POA: Insufficient documentation

## 2015-02-21 DIAGNOSIS — Z9889 Other specified postprocedural states: Secondary | ICD-10-CM | POA: Diagnosis not present

## 2015-02-21 DIAGNOSIS — Z79899 Other long term (current) drug therapy: Secondary | ICD-10-CM | POA: Insufficient documentation

## 2015-02-21 DIAGNOSIS — R1010 Upper abdominal pain, unspecified: Secondary | ICD-10-CM | POA: Diagnosis present

## 2015-02-21 DIAGNOSIS — I2510 Atherosclerotic heart disease of native coronary artery without angina pectoris: Secondary | ICD-10-CM | POA: Diagnosis not present

## 2015-02-21 DIAGNOSIS — I10 Essential (primary) hypertension: Secondary | ICD-10-CM | POA: Insufficient documentation

## 2015-02-21 DIAGNOSIS — R000 Tachycardia, unspecified: Secondary | ICD-10-CM | POA: Insufficient documentation

## 2015-02-21 DIAGNOSIS — Z8639 Personal history of other endocrine, nutritional and metabolic disease: Secondary | ICD-10-CM | POA: Diagnosis not present

## 2015-02-21 DIAGNOSIS — J449 Chronic obstructive pulmonary disease, unspecified: Secondary | ICD-10-CM | POA: Insufficient documentation

## 2015-02-21 LAB — COMPREHENSIVE METABOLIC PANEL
ALT: 34 U/L (ref 17–63)
AST: 24 U/L (ref 15–41)
Albumin: 4.4 g/dL (ref 3.5–5.0)
Alkaline Phosphatase: 87 U/L (ref 38–126)
Anion gap: 11 (ref 5–15)
BUN: 16 mg/dL (ref 6–20)
CHLORIDE: 104 mmol/L (ref 101–111)
CO2: 23 mmol/L (ref 22–32)
CREATININE: 1.17 mg/dL (ref 0.61–1.24)
Calcium: 9.4 mg/dL (ref 8.9–10.3)
Glucose, Bld: 108 mg/dL — ABNORMAL HIGH (ref 65–99)
POTASSIUM: 4 mmol/L (ref 3.5–5.1)
Sodium: 138 mmol/L (ref 135–145)
Total Bilirubin: 0.6 mg/dL (ref 0.3–1.2)
Total Protein: 8 g/dL (ref 6.5–8.1)

## 2015-02-21 LAB — CBC
HEMATOCRIT: 37.6 % — AB (ref 39.0–52.0)
Hemoglobin: 13.8 g/dL (ref 13.0–17.0)
MCH: 30.9 pg (ref 26.0–34.0)
MCHC: 36.7 g/dL — ABNORMAL HIGH (ref 30.0–36.0)
MCV: 84.1 fL (ref 78.0–100.0)
PLATELETS: 253 10*3/uL (ref 150–400)
RBC: 4.47 MIL/uL (ref 4.22–5.81)
RDW: 14.1 % (ref 11.5–15.5)
WBC: 9.7 10*3/uL (ref 4.0–10.5)

## 2015-02-21 LAB — URINALYSIS, ROUTINE W REFLEX MICROSCOPIC
BILIRUBIN URINE: NEGATIVE
GLUCOSE, UA: NEGATIVE mg/dL
KETONES UR: NEGATIVE mg/dL
LEUKOCYTES UA: NEGATIVE
NITRITE: NEGATIVE
Protein, ur: NEGATIVE mg/dL
Specific Gravity, Urine: 1.01 (ref 1.005–1.030)
pH: 6 (ref 5.0–8.0)

## 2015-02-21 LAB — URINE MICROSCOPIC-ADD ON
Squamous Epithelial / LPF: NONE SEEN
WBC, UA: NONE SEEN WBC/hpf (ref 0–5)

## 2015-02-21 LAB — I-STAT CG4 LACTIC ACID, ED
LACTIC ACID, VENOUS: 3.68 mmol/L — AB (ref 0.5–2.0)
LACTIC ACID, VENOUS: 2.94 mmol/L — AB (ref 0.5–2.0)

## 2015-02-21 LAB — POC OCCULT BLOOD, ED: Fecal Occult Bld: NEGATIVE

## 2015-02-21 LAB — LIPASE, BLOOD: LIPASE: 17 U/L (ref 11–51)

## 2015-02-21 MED ORDER — HYDROCODONE-ACETAMINOPHEN 5-325 MG PO TABS
2.0000 | ORAL_TABLET | Freq: Once | ORAL | Status: AC
  Administered 2015-02-21: 2 via ORAL
  Filled 2015-02-21: qty 2

## 2015-02-21 MED ORDER — SODIUM CHLORIDE 0.9 % IV BOLUS (SEPSIS)
1000.0000 mL | Freq: Once | INTRAVENOUS | Status: AC
  Administered 2015-02-21: 1000 mL via INTRAVENOUS

## 2015-02-21 MED ORDER — HYDROMORPHONE HCL 1 MG/ML IJ SOLN
1.0000 mg | Freq: Once | INTRAMUSCULAR | Status: AC
  Administered 2015-02-21: 1 mg via INTRAVENOUS
  Filled 2015-02-21: qty 1

## 2015-02-21 MED ORDER — ONDANSETRON HCL 4 MG/2ML IJ SOLN
4.0000 mg | Freq: Once | INTRAMUSCULAR | Status: AC | PRN
  Administered 2015-02-21: 4 mg via INTRAVENOUS
  Filled 2015-02-21: qty 2

## 2015-02-21 MED ORDER — HYDROCODONE-ACETAMINOPHEN 5-325 MG PO TABS: 2.0000 | ORAL_TABLET | ORAL | Status: DC | PRN

## 2015-02-21 MED ORDER — IOHEXOL 350 MG/ML SOLN
100.0000 mL | Freq: Once | INTRAVENOUS | Status: AC | PRN
  Administered 2015-02-21: 100 mL via INTRAVENOUS

## 2015-02-21 MED ORDER — ONDANSETRON 4 MG PO TBDP: ORAL_TABLET | ORAL | Status: DC

## 2015-02-21 NOTE — Telephone Encounter (Signed)
Insurance was approved the PA# is [id number]. We did not schedule the CT because he was in the ER today and they done.

## 2015-02-21 NOTE — Discharge Instructions (Signed)
If you were given medicines take as directed.  If you are on coumadin or contraceptives realize their levels and effectiveness is altered by many different medicines.  If you have any reaction (rash, tongues swelling, other) to the medicines stop taking and see a physician.    If your blood pressure was elevated in the ER make sure you follow up for management with a primary doctor or return for chest pain, shortness of breath or stroke symptoms.  Please follow up as directed and return to the ER or see a physician for new or worsening symptoms.  Thank you. Filed Vitals:   02/21/15 0704 02/21/15 0800 02/21/15 0900 02/21/15 0934  BP: 166/100 138/88 136/94 148/86  Pulse: 106 93 92 88  Temp: 97.8 F (36.6 C)   98 F (36.7 C)  TempSrc: Oral   Oral  Resp: 18   16  Height: 6' (1.829 m)     Weight: 208 lb (94.348 kg)     SpO2: 96% 94% 96% 100%

## 2015-02-21 NOTE — ED Notes (Signed)
Pt reports mid abd pain since December with n/v/d.  Pt states 3 episodes of emesis in past 24 hours.  Pt to see GI doctor on 03/01/15, was told to come to ER if pain got worse. Pt alert and oriented, ambulatory in triage.

## 2015-02-21 NOTE — ED Provider Notes (Signed)
CSN: [id number]     Arrival date & time 02/21/15  [id number] History   First MD Initiated Contact with Patient 02/21/15 [phone]     Chief Complaint  Patient presents with  . Abdominal Pain     (Consider location/radiation/quality/duration/timing/severity/associated sxs/prior Treatment) HPI Comments: 60 year old male with history of coronary artery disease, high blood pressure, tobacco abuse presents with recurrent central and upper abdominal pain focal radiation since December 20. Pain is gradually gotten worse daily. Patient saw GI doctor who recommended he come to the ER if his pain gets worse. Patient has not had a CT scan for this. Patient has not had an upper or lower scope. Patient mitts to intermittent alcohol use. No history of pancreatitis or gallbladder disease. Pain is currently severe. Patient had to 3 episodes of vomiting, and recent dark stool. Patient is very vague with his history, initially denied any other significant medical problems she has past medical history reveals vascular disease COPD.  Patient is a 60 y.o. male presenting with abdominal pain. The history is provided by the patient.  Abdominal Pain Associated symptoms: nausea and vomiting   Associated symptoms: no chest pain, no chills, no dysuria, no fever and no shortness of breath     Past Medical History  Diagnosis Date  . Colitis   . Coronary atherosclerosis of native coronary artery     Largely nonobstructive in major epicardial was with 99% small diagonal stenosis - medical therapy  . COPD (chronic obstructive pulmonary disease) ([place])   . Hypertension   . Hyperlipidemia   . Tobacco use    Past Surgical History  Procedure Laterality Date  . Mouth surgery    . Cardiac catheterization  10/2011    [facility]  . Left heart catheterization with coronary angiogram N/A 10/19/2011    Procedure: LEFT HEART CATHETERIZATION WITH CORONARY ANGIOGRAM;  Surgeon: [name], MD;  Location: [facility] CATH LAB;  Service:  Cardiovascular;  Laterality: N/A;  . Abdominal aortagram N/A 12/23/2013    Procedure: ABDOMINAL Maxcine Ham;  Surgeon: [name], MD;  Location: [facility] CATH LAB;  Service: Cardiovascular;  Laterality: N/A;   Family History  Problem Relation Age of Onset  . Heart disease Brother     Died of MI at 34  . Diabetes Mother   . Colon cancer Neg Hx    Social History  Substance Use Topics  . Smoking status: Current Every Day Smoker -- 0.25 packs/day for 40 years    Types: Cigarettes  . Smokeless tobacco: Never Used     Comment: smokes 3 ciggerettes daily for the past 2 months  . Alcohol Use: 0.0 oz/week    0 Standard drinks or equivalent per week     Comment: Occasional    Review of Systems  Constitutional: Negative for fever, chills and unexpected weight change.  HENT: Negative for congestion.   Eyes: Negative for visual disturbance.  Respiratory: Negative for shortness of breath.   Cardiovascular: Negative for chest pain.  Gastrointestinal: Positive for nausea, vomiting and abdominal pain.  Genitourinary: Negative for dysuria and flank pain.  Musculoskeletal: Negative for back pain, neck pain and neck stiffness.  Skin: Negative for rash.  Neurological: Negative for light-headedness and headaches.      Allergies  Bee venom  Home Medications   Prior to Admission medications   Medication Sig Start Date End Date Taking? Authorizing Provider  amLODipine (NORVASC) 5 MG tablet Take 1 tablet (5 mg total) by mouth daily. 11/02/14  Yes Rogue Jury  Ferne Reus, MD  aspirin EC 81 MG tablet Take 81 mg by mouth daily.   Yes Historical Provider, MD  nitroGLYCERIN (NITRODUR - DOSED IN MG/24 HR) 0.2 mg/hr patch Place 0.2 mg onto the skin daily.   Yes Historical Provider, MD  traMADol (ULTRAM) 50 MG tablet TAKE 1 TABLET BY MOUTH EVERY 6 HOURS AS NEEDED FOR PAIN. 02/15/15  Yes Historical Provider, MD  albuterol (PROVENTIL HFA;VENTOLIN HFA) 108 (90 BASE) MCG/ACT inhaler Inhale 2 puffs into the lungs  every 6 (six) hours as needed for wheezing or shortness of breath. Patient not taking: Reported on 02/21/2015 01/21/12   Kathie Dike, MD  EPIPEN 2-PAK 0.3 MG/0.3ML SOAJ injection Inject 0.3 mg as directed daily as needed. Allergic reaction 10/08/14   Historical Provider, MD  HYDROcodone-acetaminophen (NORCO/VICODIN) 5-325 MG tablet Take 2 tablets by mouth every 4 (four) hours as needed. 02/21/15   Elnora Morrison, MD  nitroGLYCERIN (NITROSTAT) 0.4 MG SL tablet Place 0.4 mg under the tongue every 5 (five) minutes as needed for chest pain (3 doses MAX).    Historical Provider, MD  ondansetron (ZOFRAN ODT) 4 MG disintegrating tablet 4mg  ODT q4 hours prn nausea/vomit 02/21/15   Elnora Morrison, MD  oxyCODONE-acetaminophen (PERCOCET) 5-325 MG per tablet Take 1 tablet by mouth every 4 (four) hours as needed for moderate pain. Patient not taking: Reported on 02/21/2015 [id number]   Delora Fuel, MD  pantoprazole (PROTONIX) 40 MG tablet Take 1 tablet (40 mg total) by mouth daily. Patient not taking: Reported on 02/21/2015 11/04/13   Kathie Dike, MD   BP 148/86 mmHg  Pulse 88  Temp(Src) 98 F (36.7 C) (Oral)  Resp 16  Ht 6' (1.829 m)  Wt 208 lb (94.348 kg)  BMI 28.20 kg/m2  SpO2 100% Physical Exam  Constitutional: He is oriented to person, place, and time. He appears well-developed and well-nourished.  HENT:  Head: Normocephalic and atraumatic.  Mild dry mucous membranes  Eyes: Conjunctivae are normal. Right eye exhibits no discharge. Left eye exhibits no discharge.  Neck: Normal range of motion. Neck supple. No tracheal deviation present.  Cardiovascular: Regular rhythm.  Tachycardia present.   Pulmonary/Chest: Effort normal and breath sounds normal.  Abdominal: Soft. He exhibits no distension. There is tenderness (central and upper abdomen no peritonitis). There is no guarding.  Musculoskeletal: He exhibits no edema.  Neurological: He is alert and oriented to person, place, and time.  Skin: Skin is warm.  No rash noted.  Psychiatric: He has a normal mood and affect.  Nursing note and vitals reviewed.   ED Course  Procedures (including critical care time)  EMERGENCY DEPARTMENT BILIARY ULTRASOUND INTERPRETATION "Study: Limited Abdominal Ultrasound of the gallbladder and common bile duct."  INDICATIONS: Abdominal pain and Vomiting Indication: Multiple views of the gallbladder and common bile duct were obtained in real-time with a Multi-frequency probe." PERFORMED BY:  Myself IMAGES ARCHIVED?: Yes FINDINGS: Gallstones absent, Gallbladder wall normal in thickness and Sonographic Murphy's sign absent LIMITATIONS: Bowel Gas and Abdominal pain INTERPRETATION: Normal  CPT Code [phone] (limited abdominal)    Labs Review Labs Reviewed  COMPREHENSIVE METABOLIC PANEL - Abnormal; Notable for the following:    Glucose, Bld 108 (*)    All other components within normal limits  CBC - Abnormal; Notable for the following:    HCT 37.6 (*)    MCHC 36.7 (*)    All other components within normal limits  URINALYSIS, ROUTINE W REFLEX MICROSCOPIC (NOT AT [facility]) - Abnormal; Notable for the following:  Hgb urine dipstick MODERATE (*)    All other components within normal limits  URINE MICROSCOPIC-ADD ON - Abnormal; Notable for the following:    Bacteria, UA RARE (*)    All other components within normal limits  I-STAT CG4 LACTIC ACID, ED - Abnormal; Notable for the following:    Lactic Acid, Venous 3.68 (*)    All other components within normal limits  I-STAT CG4 LACTIC ACID, ED - Abnormal; Notable for the following:    Lactic Acid, Venous 2.94 (*)    All other components within normal limits  LIPASE, BLOOD  POC OCCULT BLOOD, ED    Imaging Review Ct Cta Abd/pel W/cm &/or W/o Cm  02/21/2015  CLINICAL DATA:  MID ABD PAIN WITH N/V/D SINCE MID DECEMBER HAS GOTTEN WORSE IN THE LAST 24 HOURS, HX COLITIS, COPD, HTN, ABDOMINAL AORTAGRAM. EXAM: CT ANGIOGRAPHY ABDOMEN AND PELVIS TECHNIQUE: Multidetector  CT imaging of the abdomen and pelvis was performed using the standard protocol during bolus administration of intravenous contrast. Multiplanar reconstructed images including MIPs were obtained and reviewed to evaluate the vascular anatomy. CONTRAST:  170mL OMNIPAQUE IOHEXOL 350 MG/ML SOLN COMPARISON:  COMPARISON CT 12/16/2013 FINDINGS: ARTERIAL FINDINGS: Aorta: Mild scattered atheromatous plaque. No aneurysm, dissection, or stenosis. Celiac axis:          Widely patent Superior mesenteric:  Widely patent, classic distal branch anatomy. Left renal:           Single, patent Right renal: Duplicated, inferior dominant, both widely patent. Inferior mesenteric:  Patent Left iliac: Mild scattered plaque especially in the internal iliac artery. No aneurysm, dissection, or stenosis. Right iliac: Mild scattered plaque. No aneurysm, dissection, or stenosis. Venous findings: Patent hepatic veins, portal vein, superior mesenteric vein, splenic vein, bilateral renal veins, IVC, and iliac venous system. Review of the MIP images confirms the above findings. Nonvascular findings: Dependent atelectasis posteriorly in the visualized lung bases. Unremarkable liver, nondilated gallbladder, spleen, adrenal glands, pancreas, left kidney. Several small right renal cysts, stable. No hydronephrosis or nephrolithiasis. Stomach, small bowel, and colon are nondilated. Urinary bladder physiologically distended. Bilateral pelvic vascular calcifications. No ascites. No free air. No adenopathy. Minimal spurring in the mid lumbar spine. Bilateral sacroiliitis. IMPRESSION: 1. No proximal mesenteric stenosis or other lesion suggestive of a vascular etiology of abdominal pain. 2. Mild scattered aortoiliac arterial plaque. 3. Right renal cysts. 4. Bilateral sacroiliitis. Electronically Signed   By: Lucrezia Europe [name]   On: 02/21/2015 08:44   I have personally reviewed and evaluated these images and lab results as part of my medical decision-making.    EKG Interpretation None      MDM   Final diagnoses:  Abdominal pain, periumbilical  Non-intractable vomiting with nausea, vomiting of unspecified type  Hematuria Patient presents with worsening abdominal pain vague with details at times after eating however also not associated with eating. Patient has CT scan scheduled outpatient however pain is getting worse.  Bedside US no acute gb pathology seen, CT for further details.  Lactate elevated. CT angiogram results reviewed no acute findings no significant vascular lesion to explain abdominal pain. Patient will need close outpatient follow up with gastroenterology for likely scopes. Patient tolerating oral, no vomiting, pain improved on reassessment. Discuss close follow-up for hematuria with primary doctor. Results and differential diagnosis were discussed with the patient/parent/guardian. Xrays were independently reviewed by myself.  Close follow up outpatient was discussed, comfortable with the plan.   Medications  HYDROmorphone (DILAUDID) injection 1 mg (1 mg Intravenous Given  02/21/15 0734)  sodium chloride 0.9 % bolus 1,000 mL (1,000 mLs Intravenous New Bag/Given 02/21/15 0734)  iohexol (OMNIPAQUE) 350 MG/ML injection 100 mL (100 mLs Intravenous Contrast Given 02/21/15 0811)  ondansetron (ZOFRAN) injection 4 mg (4 mg Intravenous Given 02/21/15 0821)  HYDROcodone-acetaminophen (NORCO/VICODIN) 5-325 MG per tablet 2 tablet (2 tablets Oral Given 02/21/15 0956)    Filed Vitals:   02/21/15 0704 02/21/15 0800 02/21/15 0900 02/21/15 0934  BP: 166/100 138/88 136/94 148/86  Pulse: 106 93 92 88  Temp: 97.8 F (36.6 C)   98 F (36.7 C)  TempSrc: Oral   Oral  Resp: 18   16  Height: 6' (1.829 m)     Weight: 208 lb (94.348 kg)     SpO2: 96% 94% 96% 100%    Final diagnoses:  Abdominal pain, periumbilical  Non-intractable vomiting with nausea, vomiting of unspecified type        Elnora Morrison, MD 02/21/15 1003

## 2015-02-22 NOTE — Progress Notes (Signed)
cc'ed to pcp °

## 2015-02-22 NOTE — Assessment & Plan Note (Signed)
Agent with noted increased loose stools over the past couple weeks with his abdominal pain. Since his stools are beginning to solidify at this time. Pain continues to be progressive. Additional labs as noted above, push CT prior off along and have it completed as soon as possible. ER precautions given. Follow-up in 2 weeks with previously scheduled appointment.

## 2015-02-22 NOTE — Assessment & Plan Note (Signed)
Patient with a history of colitis 5 years ago. Again having periumbilical abdominal pain approximately 2 weeks ago which is become more frequent and progressive and intensity. Also with diarrhea as noted below. A basic metabolic profile and CT of the abdomen and pelvis have been entered and are pending insurance. Colonoscopy up-to-date, next due 2021. Today I will had a CBC and lipase to his basic metabolic profile orders. We'll check on the progress of the CT of the abdomen and pelvis and try to push out along. Recommended keep his follow-up appointment scheduled for a couple weeks. ER precautions were given for worsening/severe pain, intractable nausea vomiting, and other red flag/warning signs or symptoms.

## 2015-03-02 ENCOUNTER — Ambulatory Visit (INDEPENDENT_AMBULATORY_CARE_PROVIDER_SITE_OTHER): Payer: PRIVATE HEALTH INSURANCE | Admitting: Gastroenterology

## 2015-03-02 ENCOUNTER — Other Ambulatory Visit: Payer: Self-pay

## 2015-03-02 ENCOUNTER — Encounter: Payer: Self-pay | Admitting: Gastroenterology

## 2015-03-02 VITALS — BP 151/87 | HR 73 | Temp 97.0°F | Ht 72.0 in | Wt 211.2 lb

## 2015-03-02 DIAGNOSIS — R1033 Periumbilical pain: Secondary | ICD-10-CM

## 2015-03-02 DIAGNOSIS — R1013 Epigastric pain: Secondary | ICD-10-CM

## 2015-03-02 DIAGNOSIS — R109 Unspecified abdominal pain: Secondary | ICD-10-CM

## 2015-03-02 MED ORDER — NA SULFATE-K SULFATE-MG SULF 17.5-3.13-1.6 GM/177ML PO SOLN: 1.0000 | ORAL | Status: DC
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
## 2015-03-02 NOTE — Assessment & Plan Note (Addendum)
SYMPTOMS NOT CONTROLLED. ETIOLOGY UNCLEAR: SMALL UMBILICAL HERNIA, LESS LIKELY SMALL BOWEL ULCER DUE TO ASA, STRUCTURE OF IC VALVE DUE TO NSAID USE, ILEAL OBSTRUCTION FROM POLYP/MASS, OR H PYLORI GASTRITIS.   LABS REVIEWED FROM JAN 2017. I PERSONALLY REVIEWED THE CT FROM JAN 2017 WITH DR. Burnis Medin UMBILICAL HERNIA.  Complete endoscopy. DISCUSSED PROCEDURE, BENEFITS, & RISKS: < 1% chance of medication reaction, bleeding, perforation, or rupture of spleen/liver. CONSIDER D/C ASA AND ADD PPI. MAY NEED GIVENS. Bring FMLA paperwork to be excused from work for 3 weeks. FOLLOW UP IN 4 MOS.   GREATER THAN 50% WAS SPENT IN COUNSELING & COORDINATION OF CARE WITH THE PATIENT: DISCUSSED DIFFERENTIAL DIAGNOSIS, PROCEDURE, BENEFITS, RISKS, AND MANAGEMENT OF PERIUMBILICAL ABDOMINAL PAIN TOTAL ENCOUNTER TIME: 32 MINS.

## 2015-03-02 NOTE — Progress Notes (Signed)
cc'ed to pcp °

## 2015-03-02 NOTE — Patient Instructions (Signed)
Complete endoscopy.  Bring FMLA paperwork to be excused from work for 3 weeks.  FOLLOW UP IN 4 MOS.

## 2015-03-04 NOTE — Progress Notes (Signed)
ON RECALL  °

## 2015-03-15 ENCOUNTER — Other Ambulatory Visit: Payer: Self-pay

## 2015-03-16 ENCOUNTER — Encounter (HOSPITAL_COMMUNITY)
Admission: RE | Disposition: A | Discharge status: Home / Self Care | Payer: Self-pay | Source: Ambulatory Visit | Attending: Gastroenterology

## 2015-03-16 ENCOUNTER — Encounter (HOSPITAL_COMMUNITY): Payer: Self-pay | Admitting: *Deleted

## 2015-03-16 ENCOUNTER — Ambulatory Visit (HOSPITAL_COMMUNITY)
Admission: RE | Admit: 2015-03-16 | Discharge: 2015-03-16 | Disposition: A | Discharge status: Home / Self Care | Payer: PRIVATE HEALTH INSURANCE | Source: Ambulatory Visit | Attending: Gastroenterology | Admitting: Gastroenterology

## 2015-03-16 DIAGNOSIS — K648 Other hemorrhoids: Secondary | ICD-10-CM | POA: Insufficient documentation

## 2015-03-16 DIAGNOSIS — Q438 Other specified congenital malformations of intestine: Secondary | ICD-10-CM | POA: Insufficient documentation

## 2015-03-16 DIAGNOSIS — K2980 Duodenitis without bleeding: Secondary | ICD-10-CM | POA: Insufficient documentation

## 2015-03-16 DIAGNOSIS — K635 Polyp of colon: Secondary | ICD-10-CM | POA: Diagnosis not present

## 2015-03-16 DIAGNOSIS — R1033 Periumbilical pain: Secondary | ICD-10-CM | POA: Diagnosis not present

## 2015-03-16 DIAGNOSIS — K2970 Gastritis, unspecified, without bleeding: Secondary | ICD-10-CM | POA: Diagnosis not present

## 2015-03-16 DIAGNOSIS — R197 Diarrhea, unspecified: Secondary | ICD-10-CM | POA: Diagnosis not present

## 2015-03-16 DIAGNOSIS — R1013 Epigastric pain: Secondary | ICD-10-CM

## 2015-03-16 DIAGNOSIS — Z79899 Other long term (current) drug therapy: Secondary | ICD-10-CM | POA: Diagnosis not present

## 2015-03-16 DIAGNOSIS — J449 Chronic obstructive pulmonary disease, unspecified: Secondary | ICD-10-CM | POA: Insufficient documentation

## 2015-03-16 DIAGNOSIS — D125 Benign neoplasm of sigmoid colon: Secondary | ICD-10-CM | POA: Insufficient documentation

## 2015-03-16 DIAGNOSIS — E785 Hyperlipidemia, unspecified: Secondary | ICD-10-CM | POA: Insufficient documentation

## 2015-03-16 DIAGNOSIS — K2950 Unspecified chronic gastritis without bleeding: Secondary | ICD-10-CM | POA: Diagnosis not present

## 2015-03-16 DIAGNOSIS — D128 Benign neoplasm of rectum: Secondary | ICD-10-CM | POA: Diagnosis not present

## 2015-03-16 DIAGNOSIS — I10 Essential (primary) hypertension: Secondary | ICD-10-CM | POA: Diagnosis not present

## 2015-03-16 DIAGNOSIS — K5730 Diverticulosis of large intestine without perforation or abscess without bleeding: Secondary | ICD-10-CM | POA: Insufficient documentation

## 2015-03-16 DIAGNOSIS — F17210 Nicotine dependence, cigarettes, uncomplicated: Secondary | ICD-10-CM | POA: Insufficient documentation

## 2015-03-16 DIAGNOSIS — R109 Unspecified abdominal pain: Secondary | ICD-10-CM

## 2015-03-16 DIAGNOSIS — I2510 Atherosclerotic heart disease of native coronary artery without angina pectoris: Secondary | ICD-10-CM | POA: Diagnosis not present

## 2015-03-16 DIAGNOSIS — Z7982 Long term (current) use of aspirin: Secondary | ICD-10-CM | POA: Insufficient documentation

## 2015-03-16 HISTORY — PX: ESOPHAGOGASTRODUODENOSCOPY: SHX5428

## 2015-03-16 HISTORY — PX: COLONOSCOPY: SHX5424

## 2015-03-16 SURGERY — COLONOSCOPY
Anesthesia: Moderate Sedation

## 2015-03-16 MED ORDER — MEPERIDINE HCL 100 MG/ML IJ SOLN
INTRAMUSCULAR | Status: DC
Start: 2015-03-16 — End: 2015-03-16
  Filled 2015-03-16: qty 2

## 2015-03-16 MED ORDER — PROMETHAZINE HCL 25 MG/ML IJ SOLN
INTRAMUSCULAR | Status: AC
  Filled 2015-03-16: qty 1

## 2015-03-16 MED ORDER — SODIUM CHLORIDE 0.9 % IV SOLN
INTRAVENOUS | Status: DC
  Administered 2015-03-16: 1000 mL via INTRAVENOUS

## 2015-03-16 MED ORDER — MIDAZOLAM HCL 5 MG/5ML IJ SOLN
INTRAMUSCULAR | Status: DC | PRN
  Administered 2015-03-16: 1 mg via INTRAVENOUS
  Administered 2015-03-16: 2 mg via INTRAVENOUS
  Administered 2015-03-16: 1 mg via INTRAVENOUS
  Administered 2015-03-16: 2 mg via INTRAVENOUS

## 2015-03-16 MED ORDER — LIDOCAINE VISCOUS 2 % MT SOLN
OROMUCOSAL | Status: DC | PRN
  Administered 2015-03-16: 1 via OROMUCOSAL

## 2015-03-16 MED ORDER — LIDOCAINE VISCOUS 2 % MT SOLN
OROMUCOSAL | Status: AC
  Filled 2015-03-16: qty 15

## 2015-03-16 MED ORDER — PROMETHAZINE HCL 25 MG/ML IJ SOLN
INTRAMUSCULAR | Status: DC | PRN
  Administered 2015-03-16: 12.5 mg via INTRAVENOUS

## 2015-03-16 MED ORDER — MIDAZOLAM HCL 5 MG/5ML IJ SOLN
INTRAMUSCULAR | Status: DC
Start: 2015-03-16 — End: 2015-03-16
  Filled 2015-03-16: qty 10

## 2015-03-16 MED ORDER — SODIUM CHLORIDE 0.9% FLUSH
INTRAVENOUS | Status: AC
  Filled 2015-03-16: qty 10

## 2015-03-16 MED ORDER — MEPERIDINE HCL 100 MG/ML IJ SOLN
INTRAMUSCULAR | Status: DC | PRN
  Administered 2015-03-16 (×2): 25 mg via INTRAVENOUS
  Administered 2015-03-16: 50 mg via INTRAVENOUS
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
## 2015-03-16 NOTE — Op Note (Signed)
[facility] [address] [place], [address]   ENDOSCOPY PROCEDURE REPORT  PATIENT: Joshua [name], Joshua [name]  MR#: [id number] BIRTHDATE: 1956/01/09 , 89  yrs. old GENDER: male  ENDOSCOPIST: Danie Binder, MD REFERRED GS:[id number] [name], [name]  PROCEDURE DATE: Apr 10, 2015 PROCEDURE:   EGD w/ biopsy INDICATIONS:periumbilical abdominal pain. MEDICATIONS: TCS+ Versed 1 mg IV and Demerol 25 mg IV  MD INIATED SEDATION: 1121 PROCEDURE COMPLETE: 1217 TOPICAL ANESTHETIC:   Viscous Xylocaine ASA CLASS:  DESCRIPTION OF PROCEDURE:     Physical exam was performed.  Informed consent was obtained from the patient after explaining the benefits, risks, and alternatives to the procedure.  The patient was connected to the monitor and placed in the left lateral position.  Continuous oxygen was provided by nasal cannula and IV medicine administered through an indwelling cannula.  After administration of sedation, the patients esophagus was intubated and the [id number]  endoscope was advanced under direct visualization to the second portion of the duodenum.  The scope was removed slowly by carefully examining the color, texture, anatomy, and integrity of the mucosa on the way out.  The patient was recovered in endoscopy and discharged home in satisfactory condition.  Estimated blood loss is zero unless otherwise noted in this procedure report.    ESOPHAGUS: The mucosa of the esophagus appeared normal.   STOMACH: Mild non-erosive gastritis (inflammation) was found in the gastric antrum and gastric body.  Multiple biopsies were performed using cold forceps.   DUODENUM: Mild duodenal inflammation was found in the duodenal bulb.   The duodenal mucosa showed no abnormalities in the 2nd part of the duodenum.  Cold forceps biopsies were taken in the bulb and second portion. COMPLICATIONS: There were no immediate complications.  ENDOSCOPIC IMPRESSION: 1.   NO SOURCE FOR ABDOMINAL PAIN  IDENTIFIED 2.   MILD Non-erosive gastritis AND DUODENITIS  RECOMMENDATIONS: HOLD ASPIRIN FOR THE NEXT MONTH. CONTINUE YOUR WEIGHT LOSS EFFORTS. DRINK WATER TO KEEP YOUR URINE LIGHT YELLOW. FOLLOW A HIGH FIBER/LOW FAT DIET. PROTONIX 30 MINUTES PRIOR TO FIRST MEAL. AVOID ITEMS THAT TRIGGER GASTRITIS. AWAIT BIOPSY RESULTS. FOLLOW UP IN 3 MOS. Next colonoscopy in 5-10 years.  REPEAT EXAM: eSigned:  Danie Binder, MD 04/10/2015 1:04 PM  CPT CODES: ICD CODES:  The ICD and CPT codes recommended by this software are interpretations from the data that the clinical staff has captured with the software.  The verification of the translation of this report to the ICD and CPT codes and modifiers is the sole responsibility of the health care institution and practicing physician where this report was generated.  [place]. will not be held responsible for the validity of the ICD and CPT codes included on this report.  AMA assumes no liability for data contained or not contained herein. CPT is a Designer, television/film set of the [employer].

## 2015-03-16 NOTE — Interval H&P Note (Signed)
History and Physical Interval Note:  03/16/2015 11:18 AM  Joshua [name]  has presented today for surgery, with the diagnosis of ABD PAIN/DYSPEPSIA  The various methods of treatment have been discussed with the patient and family. After consideration of risks, benefits and other options for treatment, the patient has consented to  Procedure(s) with comments: COLONOSCOPY (N/A) - 1100 ESOPHAGOGASTRODUODENOSCOPY (EGD) (N/A) as a surgical intervention .  The patient's history has been reviewed, patient examined, no change in status, stable for surgery.  I have reviewed the patient's chart and labs.  Questions were answered to the patient's satisfaction.     [employer]

## 2015-03-16 NOTE — Op Note (Signed)
[facility] [address] [place], [address]   COLONOSCOPY PROCEDURE REPORT  PATIENT: Joshua [name], Joshua [name]  MR#: [id number] BIRTHDATE: 1956-01-25 , 71  yrs. old GENDER: male ENDOSCOPIST: Danie Binder, MD REFERRED [id number] [name], [name] PROCEDURE DATE:  March 20, 2015 PROCEDURE:   Colonoscopy with cold biopsy polypectomy INDICATIONS:periumbilical abdominal pain. MEDICATIONS: Promethazine (Phenergan) 12.5 mg IV, Demerol 75 mg IV, and Versed 5 mg IV  MD INITAITED SEDATION. PROCEDURE COMPLETE: 1217  DESCRIPTION OF PROCEDURE:    Physical exam was performed.  Informed consent was obtained from the patient after explaining the benefits, risks, and alternatives to procedure.  The patient was connected to monitor and placed in left lateral position. Continuous oxygen was provided by nasal cannula and IV medicine administered through an indwelling cannula.  After administration of sedation and rectal exam, the patients rectum was intubated and the [id number]  colonoscope was advanced under direct visualization to the ileum.  The scope was removed slowly by carefully examining the color, texture, anatomy, and integrity mucosa on the way out.  The patient was recovered in endoscopy and discharged home in satisfactory condition. Estimated blood loss is zero unless otherwise noted in this procedure report.    COLON FINDINGS: The examined terminal ileum appeared to be normal.  10 CM VISUALIZED. Five sessile polyps ranging from 2 to 68mm in size were found in the sigmoid colon.  A polypectomy was performed with cold forceps.  , A sessile polyp measuring 5 mm in size was found in the rectum.  A polypectomy was performed with cold forceps.  , There was mild diverticulosis noted in the sigmoid colon with associated muscular hypertrophy.  , The colon was redundant.  Manual abdominal counter-pressure was used to reach the cecum, and Small internal hemorrhoids were found. PREP  QUALITY: excellent.  CECAL W/D TIME: 15       minutes COMPLICATIONS: None  ENDOSCOPIC IMPRESSION:N 1.   NO SOURCE FOR ABDOMINAL PAIN IDENTIFIED. 2.   SIX COLORECTAL polyps REMOVED 3.   The colon was redundant 4.   Mild diverticulosis noted in the sigmoid colon 5.   Small internal hemorrhoids  RECOMMENDATIONS: HOLD ASPIRIN FOR THE NEXT MONTH. CONTINUE YOUR WEIGHT LOSS EFFORTS. DRINK WATER TO KEEP YOUR URINE LIGHT YELLOW. FOLLOW A HIGH FIBER/LOW FAT DIET. PROTONIX 30 MINUTES PRIOR TO FIRST MEAL. AVOID ITEMS THAT TRIGGER GASTRITIS. AWAIT BIOPSY RESULTS. FOLLOW UP IN 3 MOS. Next colonoscopy in 5-10 years.  eSigned:  Danie Binder, MD 20-Mar-2015 12:34 PM  CPT CODES: ICD CODES:  The ICD and CPT codes recommended by this software are interpretations from the data that the clinical staff has captured with the software.  The verification of the translation of this report to the ICD and CPT codes and modifiers is the sole responsibility of the health care institution and practicing physician where this report was generated.  [place]. will not be held responsible for the validity of the ICD and CPT codes included on this report.  AMA assumes no liability for data contained or not contained herein. CPT is a Designer, television/film set of the [employer].

## 2015-03-16 NOTE — Discharge Instructions (Signed)
NO SOURCE FOR YOUR ABDOMINAL PAIN WAS IDENTIFIED. You had 6 polyps removed. You have internal hemorrhoids & DIVERTICULOSIS IN YOUR LEFT COLON. You have gastritis & duodenitis.  I biopsied your stomach AND SMALL BOWEL.    HOLD ASPIRIN FOR THE NEXT MONTH.  CONTINUE YOUR WEIGHT LOSS EFFORTS. LOSE 10 MORE LBS.  DRINK WATER TO KEEP YOUR URINE LIGHT YELLOW.  FOLLOW A HIGH FIBER/LOW FAT DIET. AVOID ITEMS THAT CAUSE BLOATING. SEE INFO BELOW.  CONTINUE PROTONIX.  TAKE 30 MINUTES PRIOR TO YOUR FIRST MEAL.  AVOID ITEMS THAT TRIGGER GASTRITIS. SEE INFO BELOW.  YOUR BIOPSY RESULTS WILL BE AVAILABLE IN MY CHART FEB 6 AND MY OFFICE WILL CONTACT YOU IN 10-14 DAYS WITH YOUR RESULTS.   FOLLOW UP IN 3 MOS.  Jun 15, 2015 at 3 PM  Next colonoscopy in 5-10 years.   ENDOSCOPY Care After Read the instructions outlined below and refer to this sheet in the next week. These discharge instructions provide you with general information on caring for yourself after you leave the hospital. While your treatment has been planned according to the most current medical practices available, unavoidable complications occasionally occur. If you have any problems or questions after discharge, call DR. FIELDS, [phone].  ACTIVITY  You may resume your regular activity, but move at a slower pace for the next 24 hours.   Take frequent rest periods for the next 24 hours.   Walking will help get rid of the air and reduce the bloated feeling in your belly (abdomen).   No driving for 24 hours (because of the medicine (anesthesia) used during the test).   You may shower.   Do not sign any important legal documents or operate any machinery for 24 hours (because of the anesthesia used during the test).    NUTRITION  Drink plenty of fluids.   You may resume your normal diet as instructed by your doctor.   Begin with a light meal and progress to your normal diet. Heavy or fried foods are harder to digest and may make  you feel sick to your stomach (nauseated).   Avoid alcoholic beverages for 24 hours or as instructed.    MEDICATIONS  You may resume your normal medications.   WHAT YOU CAN EXPECT TODAY  Some feelings of bloating in the abdomen.   Passage of more gas than usual.   Spotting of blood in your stool or on the toilet paper  .  IF YOU HAD POLYPS REMOVED DURING THE ENDOSCOPY:  Eat a soft diet IF YOU HAVE NAUSEA, BLOATING, ABDOMINAL PAIN, OR VOMITING.    FINDING OUT THE RESULTS OF YOUR TEST Not all test results are available during your visit. DR. Oneida Alar WILL CALL YOU WITHIN 14 DAYS OF YOUR PROCEDUE WITH YOUR RESULTS. Do not assume everything is normal if you have not heard from DR. FIELDS, CALL HER OFFICE AT [phone].  SEEK IMMEDIATE MEDICAL ATTENTION AND CALL THE OFFICE: [phone] IF:  You have more than a spotting of blood in your stool.   Your belly is swollen (abdominal distention).   You are nauseated or vomiting.   You have a temperature over 101F.   You have abdominal pain or discomfort that is severe or gets worse throughout the day.   Gastritis/DUODENITIS  Gastritis is an inflammation (the body's way of reacting to injury and/or infection) of the stomach. DUODENITIS is an inflammation (the body's way of reacting to injury and/or infection) of the FIRST PART OF THE SMALL INTESTINES. It  is often caused by bacterial (germ) infections. It can also be caused BY ASPIRIN, BC/GOODY POWDER'S, (IBUPROFEN) MOTRIN, OR ALEVE (NAPROXEN), chemicals (including alcohol), SPICY FOODS, and medications. This illness may be associated with generalized malaise (feeling tired, not well), UPPER ABDOMINAL STOMACH cramps, and fever. One common bacterial cause of gastritis is an organism known as H. Pylori. This can be treated with antibiotics.     High-Fiber Diet A high-fiber diet changes your normal diet to include more whole grains, legumes, fruits, and vegetables. Changes in the diet  involve replacing refined carbohydrates with unrefined foods. The calorie level of the diet is essentially unchanged. The Dietary Reference Intake (recommended amount) for adult males is 38 grams per day. For adult females, it is 25 grams per day. Pregnant and lactating women should consume 28 grams of fiber per day. Fiber is the intact part of a plant that is not broken down during digestion. Functional fiber is fiber that has been isolated from the plant to provide a beneficial effect in the body. PURPOSE  Increase stool bulk.   Ease and regulate bowel movements.  Lower cholesterol.  REDUCE RISK OF COLON CANCER  INDICATIONS THAT YOU NEED MORE FIBER  Constipation and hemorrhoids.   Uncomplicated diverticulosis (intestine condition) and irritable bowel syndrome.   Weight management.   As a protective measure against hardening of the arteries (atherosclerosis), diabetes, and cancer.   GUIDELINES FOR INCREASING FIBER IN THE DIET  Start adding fiber to the diet slowly. A gradual increase of about 5 more grams (2 slices of whole-wheat bread, 2 servings of most fruits or vegetables, or 1 bowl of high-fiber cereal) per day is best. Too rapid an increase in fiber may result in constipation, flatulence, and bloating.   Drink enough water and fluids to keep your urine clear or pale yellow. Water, juice, or caffeine-free drinks are recommended. Not drinking enough fluid may cause constipation.   Eat a variety of high-fiber foods rather than one type of fiber.   Try to increase your intake of fiber through using high-fiber foods rather than fiber pills or supplements that contain small amounts of fiber.   The goal is to change the types of food eaten. Do not supplement your present diet with high-fiber foods, but replace foods in your present diet.   INCLUDE A VARIETY OF FIBER SOURCES  Replace refined and processed grains with whole grains, canned fruits with fresh fruits, and incorporate  other fiber sources. White rice, white breads, and most bakery goods contain little or no fiber.   Brown whole-grain rice, buckwheat oats, and many fruits and vegetables are all good sources of fiber. These include: broccoli, Brussels sprouts, cabbage, cauliflower, beets, sweet potatoes, white potatoes (skin on), carrots, tomatoes, eggplant, squash, berries, fresh fruits, and dried fruits.   Cereals appear to be the richest source of fiber. Cereal fiber is found in whole grains and bran. Bran is the fiber-rich outer coat of cereal grain, which is largely removed in refining. In whole-grain cereals, the bran remains. In breakfast cereals, the largest amount of fiber is found in those with "bran" in their names. The fiber content is sometimes indicated on the label.   You may need to include additional fruits and vegetables each day.   In baking, for 1 cup white flour, you may use the following substitutions:   1 cup whole-wheat flour minus 2 tablespoons.   1/2 cup white flour plus 1/2 cup whole-wheat flour.   Low-Fat Diet BREADS, CEREALS, PASTA, RICE,  DRIED PEAS, AND BEANS These products are high in carbohydrates and most are low in fat. Therefore, they can be increased in the diet as substitutes for fatty foods. They too, however, contain calories and should not be eaten in excess. Cereals can be eaten for snacks as well as for breakfast.  Include foods that contain fiber (fruits, vegetables, whole grains, and legumes). Research shows that fiber may lower blood cholesterol levels, especially the water-soluble fiber found in fruits, vegetables, oat products, and legumes. FRUITS AND VEGETABLES It is good to eat fruits and vegetables. Besides being sources of fiber, both are rich in vitamins and some minerals. They help you get the daily allowances of these nutrients. Fruits and vegetables can be used for snacks and desserts. MEATS Limit lean meat, chicken, [place], and fish to no more than 6 ounces  per day. Beef, Pork, and Lamb Use lean cuts of beef, pork, and lamb. Lean cuts include:  Extra-lean ground beef.  Arm roast.  Sirloin tip.  Center-cut ham.  Round steak.  Loin chops.  Rump roast.  Tenderloin.  Trim all fat off the outside of meats before cooking. It is not necessary to severely decrease the intake of red meat, but lean choices should be made. Lean meat is rich in protein and contains a highly absorbable form of iron. Premenopausal women, in particular, should avoid reducing lean red meat because this could increase the risk for low red blood cells (iron-deficiency anemia). The organ meats, such as liver, sweetbreads, kidneys, and brain are very rich in cholesterol. They should be limited. Chicken and [place] These are good sources of protein. The fat of poultry can be reduced by removing the skin and underlying fat layers before cooking. Chicken and [place] can be substituted for lean red meat in the diet. Poultry should not be fried or covered with high-fat sauces. Fish and Shellfish Fish is a good source of protein. Shellfish contain cholesterol, but they usually are low in saturated fatty acids. The preparation of fish is important. Like chicken and [place], they should not be fried or covered with high-fat sauces. EGGS Egg whites contain no fat or cholesterol. They can be eaten often. Try 1 to 2 egg whites instead of whole eggs in recipes or use egg substitutes that do not contain yolk. MILK AND DAIRY PRODUCTS Use skim or 1% milk instead of 2% or whole milk. Decrease whole milk, natural, and processed cheeses. Use nonfat or low-fat (2%) cottage cheese or low-fat cheeses made from vegetable oils. Choose nonfat or low-fat (1 to 2%) yogurt. Experiment with evaporated skim milk in recipes that call for heavy cream. Substitute low-fat yogurt or low-fat cottage cheese for sour cream in dips and salad dressings. Have at least 2 servings of low-fat dairy products, such as 2 glasses of  skim (or 1%) milk each day to help get your daily calcium intake.  FATS AND OILS Reduce the total intake of fats, especially saturated fat. Butterfat, lard, and beef fats are high in saturated fat and cholesterol. These should be avoided as much as possible. Vegetable fats do not contain cholesterol, but certain vegetable fats, such as coconut oil, palm oil, and palm kernel oil are very high in saturated fats. These should be limited. These fats are often used in bakery goods, processed foods, popcorn, oils, and nondairy creamers. Vegetable shortenings and some peanut butters contain hydrogenated oils, which are also saturated fats. Read the labels on these foods and check for saturated vegetable oils. Unsaturated vegetable  oils and fats do not raise blood cholesterol. However, they should be limited because they are fats and are high in calories. Total fat should still be limited to 30% of your daily caloric intake. Desirable liquid vegetable oils are corn oil, cottonseed oil, olive oil, canola oil, safflower oil, soybean oil, and sunflower oil. Peanut oil is not as good, but small amounts are acceptable. Buy a heart-healthy tub margarine that has no partially hydrogenated oils in the ingredients. Mayonnaise and salad dressings often are made from unsaturated fats, but they should also be limited because of their high calorie and fat content. Seeds, nuts, peanut butter, olives, and avocados are high in fat, but the fat is mainly the unsaturated type. These foods should be limited mainly to avoid excess calories and fat. OTHER EATING TIPS Snacks  Most sweets should be limited as snacks. They tend to be rich in calories and fats, and their caloric content outweighs their nutritional value. Some good choices in snacks are graham crackers, melba toast, soda crackers, bagels (no egg), English muffins, fruits, and vegetables. These snacks are preferable to snack crackers, [place] fries, and chips. Popcorn should  be air-popped or cooked in small amounts of liquid vegetable oil. Desserts Eat fruit, low-fat yogurt, and fruit ices. AVOID pastries, cake, and cookies. Sherbet, angel food cake, gelatin dessert, frozen low-fat yogurt, or other frozen products that do not contain saturated fat (pure fruit juice bars, frozen ice pops) are also acceptable.  COOKING METHODS Choose those methods that use little or no fat. They include: Poaching.  Braising.  Steaming.  Grilling.  Baking.  Stir-frying.  Broiling.  Microwaving.  Foods can be cooked in a nonstick pan without added fat, or use a nonfat cooking spray in regular cookware. Limit fried foods and avoid frying in saturated fat. Add moisture to lean meats by using water, broth, cooking wines, and other nonfat or low-fat sauces along with the cooking methods mentioned above. Soups and stews should be chilled after cooking. The fat that forms on top after a few hours in the refrigerator should be skimmed off. When preparing meals, avoid using excess salt. Salt can contribute to raising blood pressure in some people. EATING AWAY FROM HOME Order entres, potatoes, and vegetables without sauces or butter. When meat exceeds the size of a deck of cards (3 to 4 ounces), the rest can be taken home for another meal. Choose vegetable or fruit salads and ask for low-calorie salad dressings to be served on the side. Use dressings sparingly. Limit high-fat toppings, such as bacon, crumbled eggs, cheese, sunflower seeds, and olives. Ask for heart-healthy tub margarine instead of butter.   Diverticulosis Diverticulosis is a common condition that develops when small pouches (diverticula) form in the wall of the colon. The risk of diverticulosis increases with age. It happens more often in people who eat a low-fiber diet. Most individuals with diverticulosis have no symptoms. Those individuals with symptoms usually experience belly (abdominal) pain, constipation, or loose stools  (diarrhea).  HOME CARE INSTRUCTIONS  Increase the amount of fiber in your diet as directed by your caregiver or dietician. This may reduce symptoms of diverticulosis.   Drink at least 6 to 8 glasses of water each day to prevent constipation.   Try not to strain when you have a bowel movement.   Avoiding nuts and seeds to prevent complications is still an uncertain benefit.       FOODS HAVING HIGH FIBER CONTENT INCLUDE:  Fruits. Apple, peach, pear, tangerine,  raisins, prunes.   Vegetables. Brussels sprouts, asparagus, broccoli, cabbage, carrot, cauliflower, romaine lettuce, spinach, summer squash, tomato, winter squash, zucchini.   Starchy Vegetables. Baked beans, kidney beans, lima beans, split peas, lentils, potatoes (with skin).   Grains. Whole wheat bread, brown rice, bran flake cereal, plain oatmeal, white rice, shredded wheat, bran muffins.   Polyps, Colon  A polyp is extra tissue that grows inside your body. Colon polyps grow in the large intestine. The large intestine, also called the colon, is part of your digestive system. It is a long, hollow tube at the end of your digestive tract where your body makes and stores stool. Most polyps are not dangerous. They are benign. This means they are not cancerous. But over time, some types of polyps can turn into cancer. Polyps that are smaller than a pea are usually not harmful. But larger polyps could someday become or may already be cancerous. To be safe, doctors remove all polyps and test them.   PREVENTION There is not one sure way to prevent polyps. You might be able to lower your risk of getting them if you:  Eat more fruits and vegetables and less fatty food.   Do not smoke.   Avoid alcohol.   Exercise every day.   Lose weight if you are overweight.   Eating more calcium and folate can also lower your risk of getting polyps. Some foods that are rich in calcium are milk, cheese, and broccoli. Some foods that are rich in  folate are chickpeas, kidney beans, and spinach.   PATIENT INSTRUCTIONS POST-ANESTHESIA  IMMEDIATELY FOLLOWING SURGERY:  Do not drive or operate machinery for the first twenty four hours after surgery.  Do not make any important decisions for twenty four hours after surgery or while taking narcotic pain medications or sedatives.  If you develop intractable nausea and vomiting or a severe headache please notify your doctor immediately.  FOLLOW-UP:  Please make an appointment with your surgeon as instructed. You do not need to follow up with anesthesia unless specifically instructed to do so.  WOUND CARE INSTRUCTIONS (if applicable):  Keep a dry clean dressing on the anesthesia/puncture wound site if there is drainage.  Once the wound has quit draining you may leave it open to air.  Generally you should leave the bandage intact for twenty four hours unless there is drainage.  If the epidural site drains for more than 36-48 hours please call the anesthesia department.  QUESTIONS?:  Please feel free to call your physician or the hospital operator if you have any questions, and they will be happy to assist you.

## 2015-03-17 NOTE — Progress Notes (Signed)
REVIEWED-NO ADDITIONAL RECOMMENDATIONS. 

## 2015-03-21 ENCOUNTER — Telehealth: Payer: Self-pay

## 2015-03-21 ENCOUNTER — Encounter (HOSPITAL_COMMUNITY): Payer: Self-pay | Admitting: Gastroenterology

## 2015-03-21 NOTE — Telephone Encounter (Signed)
Pt called and is wanting the results of his colonoscopy and pathology. Pt states his pain is back and is just like it was before.

## 2015-03-21 NOTE — Telephone Encounter (Signed)
LMOM to call.

## 2015-03-23 NOTE — Telephone Encounter (Signed)
Please call pt. His stomach AND SMALL BOWEL Bx shows gastritis/DUODENITIS. He had HYPERPLASTIC POLYPS removed. No source for his abdominal pain HAS been identified. He needs to see DR. JENKINS TO CONSIDER A HERNIA REPAIR OR EXPLORATORY LAPAROTOMY.

## 2015-03-24 ENCOUNTER — Other Ambulatory Visit: Payer: Self-pay

## 2015-03-24 DIAGNOSIS — K429 Umbilical hernia without obstruction or gangrene: Secondary | ICD-10-CM

## 2015-03-24 NOTE — Telephone Encounter (Signed)
Called pt and LMOM.  

## 2015-03-28 ENCOUNTER — Other Ambulatory Visit: Payer: Self-pay

## 2015-03-28 DIAGNOSIS — R1033 Periumbilical pain: Secondary | ICD-10-CM

## 2015-03-28 NOTE — Telephone Encounter (Signed)
PT called and is aware. OK to make the referral to Dr. Arnoldo Morale.

## 2015-03-28 NOTE — Telephone Encounter (Signed)
Referral has been made.

## 2015-03-28 NOTE — Telephone Encounter (Signed)
L/M to call.

## 2015-04-01 ENCOUNTER — Telehealth: Payer: Self-pay | Admitting: Gastroenterology

## 2015-04-01 NOTE — Telephone Encounter (Signed)
Pt called to give [place] the name of a surgeon he wants to be referred to. Dr Neysa Bonito at [address].

## 2015-04-04 NOTE — Telephone Encounter (Signed)
I have patient set up on 04/11/15 @ 2:30 at [facility]. I leave a message for him to call me back

## 2015-04-04 NOTE — Telephone Encounter (Signed)
Pt is aware of appointment 

## 2015-04-11 ENCOUNTER — Ambulatory Visit: Payer: Self-pay | Admitting: Surgery

## 2015-04-11 NOTE — H&P (Signed)
Joshua Hall 04/11/2015 2:05 PM Location: [facility] Patient #: [id number] DOB: 10/12/1955 Divorced / Language: [name] / Race: Black or African American Male  History of Present Illness [name] A. Emmerson Taddei MD; 04/11/2015 3:08 PM) Patient words: Patient sent at the request of Dr. Oneida Alar for local pain. The patient is a 3 month history of periumbilical pain. The pain comes and goes. It is severe at times location is directly inside the umbilicus without radiation. No associated nausea or vomiting. He recently underwent a colonoscopy and upper endoscopy. He had mild gastritis and duodenitis noted. Denies any epigastric pain, nausea or vomiting. He denies heartburn today. He has a history of colitis but his colonoscopy was otherwise normal except for some hyperplastic polyps. Coughing sneezing or lifting does not exacerbate his abdominal pain. He is point tender at his umbilicus and that's where he points currently. No weight loss. He has been out of work since December try and work up was abdominal pain. He has good days and bad days. Denies blood in stool.  The patient is a 60 year old male.   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 AmLODIPine Besylate (5MG  Tablet, Oral) Active. Aspirin (81MG  Tablet, Oral) Active. EpiPen (0.3 MG/0.3ML(1:1000) Device,  Intramuscular) Active. Nitroglycerin (0.2MG /HR Patch 24HR, Transdermal) Active. Nitroglycerin (0.4MG  Tab Sublingual, Sublingual) Active. Oxycodone-Acetaminophen (5-325MG  Tablet, Oral) Active. Pantoprazole Sodium (40MG  Tablet DR, Oral) Active. TraMADol HCl (50MG  Tablet, Oral) Active. Medications Reconciled  Social History [name], [name]; 04/11/2015 2:06 PM) Alcohol use Occasional alcohol use. Caffeine use Carbonated beverages, Coffee, Tea. Illicit drug use Prefer to discuss with provider. Tobacco use Current every day smoker.  Family History [name], [name]; 04/11/2015 2:06 PM) Alcohol Abuse Family Members In General. Colon Cancer Family Members In General. Colon Polyps Family Members In General. Diabetes Mellitus Brother, Daughter, Father, Mother, Sister. Heart Disease Brother, Father. Heart disease in male family member before age 23 Heart disease in male family member before age 73 Hypertension Brother, Daughter, Father, Mother, Sister. Prostate Cancer Family Members In General.     Review of Systems [name] CMA; 04/11/2015 2:06 PM) General Present- Appetite Loss, Fatigue and Weight Loss. Not Present- Chills, Fever, Night Sweats and Weight Gain. Skin Not Present- Change in Wart/Mole, Dryness, Hives, Jaundice, New Lesions, Non-Healing Wounds, Rash and Ulcer. HEENT Present- Hearing Loss, Nose Bleed and Sinus Pain. Not Present- Earache, Hoarseness, Oral Ulcers, Ringing in the Ears, Seasonal Allergies, Sore Throat, Visual Disturbances, Wears glasses/contact lenses and Yellow Eyes. Respiratory Present- Difficulty Breathing and Snoring. Not Present- Bloody sputum, Chronic Cough and Wheezing. Breast Not Present- Breast Mass, Breast Pain, Nipple Discharge and Skin Changes. Cardiovascular Present- Chest Pain, Leg Cramps and Shortness of Breath. Not Present- Difficulty Breathing Lying Down, Palpitations, Rapid Heart Rate and Swelling of  Extremities. Gastrointestinal Present- Abdominal Pain, Bloating, Change in Bowel Habits, Excessive gas, Gets full quickly at meals and Indigestion. Not Present- Bloody Stool, Chronic diarrhea, Constipation, Difficulty Swallowing, Hemorrhoids, Nausea, Rectal Pain and  Vomiting. Male Genitourinary Present- Blood in Urine, Impotence, Nocturia and Urine Leakage. Not Present- Change in Urinary Stream, Frequency, Painful Urination and Urgency. Musculoskeletal Present- Back Pain, Joint Pain, Joint Stiffness, Muscle Pain and Muscle Weakness. Not Present- Swelling of Extremities. Neurological Present- Headaches. Not Present- Decreased Memory, Fainting, Numbness, Seizures, Tingling, Tremor, Trouble walking and Weakness. Psychiatric Not Present- Anxiety, Bipolar, Change in Sleep Pattern, Depression, Fearful and Frequent crying. Endocrine Present- Cold Intolerance. Not Present- Excessive Hunger, Hair Changes, Heat Intolerance, Hot flashes and New Diabetes. Hematology Present- Easy Bruising and Excessive bleeding. Not Present- Gland problems, HIV and Persistent Infections.  Vitals [employer] R. Brooks CMA; 04/11/2015 2:05 PM) 04/11/2015 2:05 PM Weight: 217 lb Height: 72.5in Body Surface Area: 2.22 m Body Mass Index: 29.03 kg/m  BP: 136/84 (Sitting, Left Arm, Standard)      Physical Exam (Lacoya Wilbanks A. Gaye Scorza MD; 04/11/2015 3:09 PM)  General Mental Status-Alert. General Appearance-Consistent with stated age. Hydration-Well hydrated. Voice-Normal.  Head and Neck Head-normocephalic, atraumatic with no lesions or palpable masses. Trachea-midline. Thyroid Gland Characteristics - normal size and consistency.  Chest and Lung Exam Chest and lung exam reveals -quiet, even and easy respiratory effort with no use of accessory muscles and on auscultation, normal breath sounds, no adventitious sounds and normal vocal resonance. Inspection Chest Wall - Normal. Back -  normal.  Cardiovascular Cardiovascular examination reveals -normal heart sounds, regular rate and rhythm with no murmurs and normal pedal pulses bilaterally.  Abdomen Note: Small reducible umbilical hernia. Point tender over the hernia but hernia not incarcerated. No rebound or guarding. Hernia defect small.  Negative Murphy sign. No organomegaly. No rebound or guarding.  Neurologic Neurologic evaluation reveals -alert and oriented x 3 with no impairment of recent or remote memory. Mental Status-Normal.  Musculoskeletal Normal Exam - Left-Upper Extremity Strength Normal and Lower Extremity Strength Normal. Normal Exam - Right-Upper Extremity Strength Normal and Lower Extremity Strength Normal.  Lymphatic Head & Neck  General Head & Neck Lymphatics: Bilateral - Description - Normal. Axillary  General Axillary Region: Bilateral - Description - Normal. Tenderness - Non Tender.    Assessment & Plan (Finch Costanzo A. Alyzae Hawkey MD; [id number] PM)  UMBILICAL HERNIA ([id number]) Impression: Discussed findings with patient. Patient had ultrasound back in 2015 which was otherwise normal. Reviewed his endoscopy reports with him as well. He is point tender over the small hernia and this appears to be the most likely etiology of his pain. I explained to him that if repairing his hernia did not improve his pain, he would need further investigation which included a CT scan, repeat gallbladder ultrasound possible HIDA study. He understands this may happen if his symptoms do not resolve with surgery. He would like to go ahead and proceed with repair his umbilical hernia as soon as possible. He understands risks and the likelihood of this helping his pain to be 90% successful. The risk of hernia repair include bleeding, infection, organ injury, bowel injury, bladder injury, nerve injury recurrent hernia, blood clots, worsening of underlying condition, chronic pain, mesh use, open surgery, death, and  the need for other operattions. Pt agrees to proceed  Current Plans You are being scheduled for surgery - Our schedulers will call you.  You should hear from our office's scheduling department within 5 working days about the location, date, and time of surgery. We try to make accommodations for patient's preferences in scheduling surgery, but sometimes the OR schedule or the surgeon's schedule prevents [place] from making those accommodations.  If you have  not heard from our office [phone]) in 5 working days, call the office and ask for your surgeon's nurse.  If you have other questions about your diagnosis, plan, or surgery, call the office and ask for your surgeon's nurse.  The anatomy & physiology of the abdominal wall was discussed. The pathophysiology of hernias was discussed. Natural history risks without surgery including progeressive enlargement, pain, incarceration, & strangulation was discussed. Contributors to complications such as smoking, obesity, diabetes, prior surgery, etc were discussed.  I feel the risks of no intervention will lead to serious problems that outweigh the operative risks; therefore, I recommended surgery to reduce and repair the hernia. I explained laparoscopic techniques with possible need for an open approach. I noted the probable use of mesh to patch and/or buttress the hernia repair  Risks such as bleeding, infection, abscess, need for further treatment, heart attack, death, and other risks were discussed. I noted a good likelihood this will help address the problem. Goals of post-operative recovery were discussed as well. Possibility that this will not correct all symptoms was explained. I stressed the importance of low-impact activity, aggressive pain control, avoiding constipation, & not pushing through pain to minimize risk of post-operative chronic pain or injury. Possibility of reherniation especially with smoking, obesity, diabetes, immunosuppression, and  other health conditions was discussed. We will work to minimize complications.  An educational handout further explaining the pathology & treatment options was given as well. Questions were answered. The patient expresses understanding & wishes to proceed with surgery.  Pt Education - [facility] Pain Control (Gross) Pt Education - [facility] Hernia Post-Op HCI (Gross): discussed with patient and provided information. The anatomy & physiology of the abdominal wall was discussed. The pathophysiology of hernias was discussed. Natural history risks without surgery including progeressive enlargement, pain, incarceration, & strangulation was discussed. Contributors to complications such as smoking, obesity, diabetes, prior surgery, etc were discussed.  I feel the risks of no intervention will lead to serious problems that outweigh the operative risks; therefore, I recommended surgery to reduce and repair the hernia. I explained laparoscopic techniques with possible need for an open approach. I noted the probable use of mesh to patch and/or buttress the hernia repair  Risks such as bleeding, infection, abscess, need for further treatment, heart attack, death, and other risks were discussed. I noted a good likelihood this will help address the problem. Goals of post-operative recovery were discussed as well. Possibility that this will not correct all symptoms was explained. I stressed the importance of low-impact activity, aggressive pain control, avoiding constipation, & not pushing through pain to minimize risk of post-operative chronic pain or injury. Possibility of reherniation especially with smoking, obesity, diabetes, immunosuppression, and other health conditions was discussed. We will work to minimize complications.  An educational handout further explaining the pathology & treatment options was given as well. Questions were answered. The patient expresses understanding & wishes to proceed with surgery.

## 2015-04-13 ENCOUNTER — Encounter (HOSPITAL_BASED_OUTPATIENT_CLINIC_OR_DEPARTMENT_OTHER): Payer: Self-pay | Admitting: Anesthesiology

## 2015-04-13 ENCOUNTER — Encounter (HOSPITAL_BASED_OUTPATIENT_CLINIC_OR_DEPARTMENT_OTHER)
Admission: RE | Disposition: A | Discharge status: Home / Self Care | Payer: Self-pay | Source: Ambulatory Visit | Attending: Surgery

## 2015-04-13 ENCOUNTER — Ambulatory Visit (HOSPITAL_BASED_OUTPATIENT_CLINIC_OR_DEPARTMENT_OTHER): Payer: PRIVATE HEALTH INSURANCE | Admitting: Anesthesiology

## 2015-04-13 ENCOUNTER — Ambulatory Visit (HOSPITAL_BASED_OUTPATIENT_CLINIC_OR_DEPARTMENT_OTHER)
Admission: RE | Admit: 2015-04-13 | Discharge: 2015-04-13 | Disposition: A | Discharge status: Home / Self Care | Payer: PRIVATE HEALTH INSURANCE | Source: Ambulatory Visit | Attending: Surgery | Admitting: Surgery

## 2015-04-13 DIAGNOSIS — J449 Chronic obstructive pulmonary disease, unspecified: Secondary | ICD-10-CM | POA: Insufficient documentation

## 2015-04-13 DIAGNOSIS — Z86718 Personal history of other venous thrombosis and embolism: Secondary | ICD-10-CM | POA: Insufficient documentation

## 2015-04-13 DIAGNOSIS — K219 Gastro-esophageal reflux disease without esophagitis: Secondary | ICD-10-CM | POA: Insufficient documentation

## 2015-04-13 DIAGNOSIS — M1990 Unspecified osteoarthritis, unspecified site: Secondary | ICD-10-CM | POA: Insufficient documentation

## 2015-04-13 DIAGNOSIS — F17200 Nicotine dependence, unspecified, uncomplicated: Secondary | ICD-10-CM | POA: Diagnosis not present

## 2015-04-13 DIAGNOSIS — Z95820 Peripheral vascular angioplasty status with implants and grafts: Secondary | ICD-10-CM | POA: Insufficient documentation

## 2015-04-13 DIAGNOSIS — Z86711 Personal history of pulmonary embolism: Secondary | ICD-10-CM | POA: Diagnosis not present

## 2015-04-13 DIAGNOSIS — K429 Umbilical hernia without obstruction or gangrene: Secondary | ICD-10-CM | POA: Insufficient documentation

## 2015-04-13 DIAGNOSIS — I509 Heart failure, unspecified: Secondary | ICD-10-CM | POA: Diagnosis not present

## 2015-04-13 DIAGNOSIS — I739 Peripheral vascular disease, unspecified: Secondary | ICD-10-CM | POA: Diagnosis not present

## 2015-04-13 DIAGNOSIS — Z79899 Other long term (current) drug therapy: Secondary | ICD-10-CM | POA: Insufficient documentation

## 2015-04-13 DIAGNOSIS — I2510 Atherosclerotic heart disease of native coronary artery without angina pectoris: Secondary | ICD-10-CM | POA: Insufficient documentation

## 2015-04-13 DIAGNOSIS — I252 Old myocardial infarction: Secondary | ICD-10-CM | POA: Insufficient documentation

## 2015-04-13 DIAGNOSIS — I110 Hypertensive heart disease with heart failure: Secondary | ICD-10-CM | POA: Diagnosis not present

## 2015-04-13 DIAGNOSIS — Z7982 Long term (current) use of aspirin: Secondary | ICD-10-CM | POA: Insufficient documentation

## 2015-04-13 HISTORY — PX: INSERTION OF MESH: SHX5868

## 2015-04-13 HISTORY — PX: UMBILICAL HERNIA REPAIR: SHX196

## 2015-04-13 SURGERY — REPAIR, HERNIA, UMBILICAL, ADULT
Anesthesia: General | Site: Abdomen

## 2015-04-13 MED ORDER — BUPIVACAINE-EPINEPHRINE 0.25% -1:200000 IJ SOLN
INTRAMUSCULAR | Status: DC | PRN
  Administered 2015-04-13: 10 mL

## 2015-04-13 MED ORDER — FENTANYL CITRATE (PF) 100 MCG/2ML IJ SOLN
50.0000 ug | INTRAMUSCULAR | Status: DC | PRN
  Administered 2015-04-13: 100 ug via INTRAVENOUS

## 2015-04-13 MED ORDER — FENTANYL CITRATE (PF) 100 MCG/2ML IJ SOLN
INTRAMUSCULAR | Status: AC
  Filled 2015-04-13: qty 2

## 2015-04-13 MED ORDER — PHENYLEPHRINE HCL 10 MG/ML IJ SOLN
INTRAMUSCULAR | Status: DC | PRN
  Administered 2015-04-13: 80 ug via INTRAVENOUS

## 2015-04-13 MED ORDER — OXYCODONE-ACETAMINOPHEN 5-325 MG PO TABS: 1.0000 | ORAL_TABLET | ORAL | Status: DC | PRN

## 2015-04-13 MED ORDER — BUPIVACAINE-EPINEPHRINE (PF) 0.25% -1:200000 IJ SOLN
INTRAMUSCULAR | Status: AC
  Filled 2015-04-13: qty 30

## 2015-04-13 MED ORDER — SCOPOLAMINE 1 MG/3DAYS TD PT72: 1.0000 | MEDICATED_PATCH | Freq: Once | TRANSDERMAL | Status: DC | PRN

## 2015-04-13 MED ORDER — LIDOCAINE HCL (CARDIAC) 20 MG/ML IV SOLN
INTRAVENOUS | Status: AC
  Filled 2015-04-13: qty 5

## 2015-04-13 MED ORDER — SUCCINYLCHOLINE CHLORIDE 20 MG/ML IJ SOLN
INTRAMUSCULAR | Status: DC | PRN
  Administered 2015-04-13: 100 mg via INTRAVENOUS

## 2015-04-13 MED ORDER — ONDANSETRON HCL 4 MG/2ML IJ SOLN
INTRAMUSCULAR | Status: DC | PRN
  Administered 2015-04-13: 4 mg via INTRAVENOUS

## 2015-04-13 MED ORDER — MIDAZOLAM HCL 2 MG/2ML IJ SOLN
1.0000 mg | INTRAMUSCULAR | Status: DC | PRN
  Administered 2015-04-13: 2 mg via INTRAVENOUS

## 2015-04-13 MED ORDER — PHENYLEPHRINE 40 MCG/ML (10ML) SYRINGE FOR IV PUSH (FOR BLOOD PRESSURE SUPPORT)
PREFILLED_SYRINGE | INTRAVENOUS | Status: AC
  Filled 2015-04-13: qty 10

## 2015-04-13 MED ORDER — CEFAZOLIN SODIUM-DEXTROSE 2-3 GM-% IV SOLR
INTRAVENOUS | Status: AC
  Filled 2015-04-13: qty 50

## 2015-04-13 MED ORDER — LACTATED RINGERS IV SOLN
INTRAVENOUS | Status: DC
  Administered 2015-04-13 (×2): via INTRAVENOUS

## 2015-04-13 MED ORDER — OXYCODONE HCL 5 MG PO TABS
5.0000 mg | ORAL_TABLET | Freq: Once | ORAL | Status: AC
  Administered 2015-04-13: 5 mg via ORAL

## 2015-04-13 MED ORDER — OXYCODONE HCL 5 MG PO TABS
ORAL_TABLET | ORAL | Status: AC
  Filled 2015-04-13: qty 1

## 2015-04-13 MED ORDER — PROPOFOL 10 MG/ML IV BOLUS
INTRAVENOUS | Status: DC | PRN
  Administered 2015-04-13: 200 mg via INTRAVENOUS

## 2015-04-13 MED ORDER — DEXAMETHASONE SODIUM PHOSPHATE 4 MG/ML IJ SOLN
INTRAMUSCULAR | Status: DC | PRN
  Administered 2015-04-13: 10 mg via INTRAVENOUS

## 2015-04-13 MED ORDER — PROMETHAZINE HCL 25 MG/ML IJ SOLN: 6.2500 mg | INTRAMUSCULAR | Status: DC | PRN

## 2015-04-13 MED ORDER — FENTANYL CITRATE (PF) 100 MCG/2ML IJ SOLN
25.0000 ug | INTRAMUSCULAR | Status: DC | PRN
Start: 2015-04-13 — End: 2015-04-13
  Administered 2015-04-13 (×2): 50 ug via INTRAVENOUS
  Administered 2015-04-13: 25 ug via INTRAVENOUS

## 2015-04-13 MED ORDER — DEXTROSE 5 % IV SOLN
3.0000 g | INTRAVENOUS | Status: AC
  Administered 2015-04-13: 2 g via INTRAVENOUS

## 2015-04-13 MED ORDER — DEXAMETHASONE SODIUM PHOSPHATE 10 MG/ML IJ SOLN
INTRAMUSCULAR | Status: AC
  Filled 2015-04-13: qty 1

## 2015-04-13 MED ORDER — CHLORHEXIDINE GLUCONATE 4 % EX LIQD: 1.0000 "application " | Freq: Once | CUTANEOUS | Status: DC

## 2015-04-13 MED ORDER — MIDAZOLAM HCL 2 MG/2ML IJ SOLN
INTRAMUSCULAR | Status: AC
  Filled 2015-04-13: qty 2

## 2015-04-13 MED ORDER — PROPOFOL 500 MG/50ML IV EMUL
INTRAVENOUS | Status: AC
  Filled 2015-04-13: qty 50

## 2015-04-13 MED ORDER — ROCURONIUM BROMIDE 100 MG/10ML IV SOLN
INTRAVENOUS | Status: DC | PRN
  Administered 2015-04-13: 30 mg via INTRAVENOUS

## 2015-04-13 MED ORDER — GLYCOPYRROLATE 0.2 MG/ML IJ SOLN: 0.2000 mg | Freq: Once | INTRAMUSCULAR | Status: DC | PRN

## 2015-04-13 MED ORDER — FUROSEMIDE 10 MG/ML IJ SOLN
INTRAMUSCULAR | Status: AC
  Filled 2015-04-13: qty 2

## 2015-04-13 MED ORDER — BUPIVACAINE HCL (PF) 0.25 % IJ SOLN
INTRAMUSCULAR | Status: AC
  Filled 2015-04-13: qty 30

## 2015-04-13 MED ORDER — SUGAMMADEX SODIUM 200 MG/2ML IV SOLN
INTRAVENOUS | Status: DC | PRN
  Administered 2015-04-13: 200 mg via INTRAVENOUS

## 2015-04-13 MED ORDER — LIDOCAINE HCL (CARDIAC) 20 MG/ML IV SOLN
INTRAVENOUS | Status: DC | PRN
  Administered 2015-04-13: 100 mg via INTRAVENOUS

## 2015-04-13 MED ORDER — ONDANSETRON HCL 4 MG/2ML IJ SOLN
INTRAMUSCULAR | Status: AC
  Filled 2015-04-13: qty 2

## 2015-04-13 SURGICAL SUPPLY — 32 items
BLADE SURG 15 STRL LF DISP TIS (BLADE) ×1 IMPLANT
BLADE SURG 15 STRL SS (BLADE) ×3
CHLORAPREP W/TINT 26ML (MISCELLANEOUS) ×3 IMPLANT
COVER BACK TABLE 60X90IN (DRAPES) ×3 IMPLANT
COVER MAYO STAND STRL (DRAPES) ×3 IMPLANT
DRAPE LAPAROTOMY TRNSV 102X78 (DRAPE) ×3 IMPLANT
DRAPE UTILITY XL STRL (DRAPES) ×3 IMPLANT
ELECT COATED BLADE 2.86 ST (ELECTRODE) ×3 IMPLANT
ELECT REM PT RETURN 9FT ADLT (ELECTROSURGICAL) ×3
ELECTRODE REM PT RTRN 9FT ADLT (ELECTROSURGICAL) ×1 IMPLANT
GLOVE BIOGEL PI IND STRL 7.0 (GLOVE) IMPLANT
GLOVE BIOGEL PI IND STRL 8 (GLOVE) ×1 IMPLANT
GLOVE BIOGEL PI INDICATOR 7.0 (GLOVE) ×4
GLOVE BIOGEL PI INDICATOR 8 (GLOVE) ×2
GLOVE ECLIPSE 8.0 STRL XLNG CF (GLOVE) ×3 IMPLANT
GLOVE SURG SS PI 6.5 STRL IVOR (GLOVE) ×2 IMPLANT
GOWN STRL REUS W/ TWL LRG LVL3 (GOWN DISPOSABLE) ×2 IMPLANT
GOWN STRL REUS W/TWL LRG LVL3 (GOWN DISPOSABLE) ×6
LIQUID BAND (GAUZE/BANDAGES/DRESSINGS) ×3 IMPLANT
MESH VENTRALEX ST 1-7/10 CRC S (Mesh General) ×2 IMPLANT
NDL HYPO 25X1 1.5 SAFETY (NEEDLE) ×1 IMPLANT
NEEDLE HYPO 25X1 1.5 SAFETY (NEEDLE) ×3 IMPLANT
PACK BASIN DAY SURGERY FS (CUSTOM PROCEDURE TRAY) ×3 IMPLANT
PENCIL BUTTON HOLSTER BLD 10FT (ELECTRODE) ×3 IMPLANT
SLEEVE SCD COMPRESS KNEE MED (MISCELLANEOUS) ×3 IMPLANT
SUT MON AB 4-0 PC3 18 (SUTURE) ×3 IMPLANT
SUT NOVA NAB DX-16 0-1 5-0 T12 (SUTURE) ×5 IMPLANT
SUT VIC AB 2-0 SH 27 (SUTURE) ×3
SUT VIC AB 2-0 SH 27XBRD (SUTURE) ×1 IMPLANT
SUT VICRYL 3-0 CR8 SH (SUTURE) ×3 IMPLANT
SYR CONTROL 10ML LL (SYRINGE) ×3 IMPLANT
TOWEL OR 17X24 6PK STRL BLUE (TOWEL DISPOSABLE) ×3 IMPLANT

## 2015-04-13 NOTE — Anesthesia Postprocedure Evaluation (Signed)
Anesthesia Post Note  Patient: Joshua Hall  Procedure(s) Performed: Procedure(s) (LRB):  UMBILICAL HERNIA REPAIR (N/A) INSERTION OF MESH (N/A)  Patient location during evaluation: PACU Anesthesia Type: General Level of consciousness: awake and alert Pain management: pain level controlled Vital Signs Assessment: post-procedure vital signs reviewed and stable Respiratory status: spontaneous breathing, nonlabored ventilation, respiratory function stable and patient connected to nasal cannula oxygen Cardiovascular status: blood pressure returned to baseline and stable Postop Assessment: no signs of nausea or vomiting Anesthetic complications: no    Last Vitals:  Filed Vitals:   04/13/15 1000 04/13/15 1035  BP: 124/74 154/86  Pulse: 61 57  Temp:  36.7 C  Resp: 10 18    Last Pain:  Filed Vitals:   04/13/15 1037  PainSc: 3                  Catalina Gravel

## 2015-04-13 NOTE — Anesthesia Procedure Notes (Signed)
Procedure Name: Intubation Date/Time: 04/13/2015 8:23 AM Performed by: Lieutenant Diego Pre-anesthesia Checklist: Patient identified, Emergency Drugs available, Suction available and Patient being monitored Patient Re-evaluated:Patient Re-evaluated prior to inductionOxygen Delivery Method: Circle System Utilized Preoxygenation: Pre-oxygenation with 100% oxygen Intubation Type: IV induction Ventilation: Mask ventilation without difficulty Laryngoscope Size: Miller and 2 Grade View: Grade I Tube type: Oral Tube size: 8.0 mm Number of attempts: 1 Airway Equipment and Method: Stylet and Oral airway Placement Confirmation: ETT inserted through vocal cords under direct vision,  positive ETCO2 and breath sounds checked- equal and bilateral Secured at: 27 cm Tube secured with: Tape Dental Injury: Teeth and Oropharynx as per pre-operative assessment

## 2015-04-13 NOTE — Transfer of Care (Signed)
Immediate Anesthesia Transfer of Care Note  Patient: Joshua Hall  Procedure(s) Performed: Procedure(s):  UMBILICAL HERNIA REPAIR (N/A) INSERTION OF MESH (N/A)  Patient Location: PACU  Anesthesia Type:General  Level of Consciousness: awake  Airway & Oxygen Therapy: Patient Spontanous Breathing and Patient connected to face mask oxygen  Post-op Assessment: Report given to RN and Post -op Vital signs reviewed and stable  Post vital signs: Reviewed and stable  Last Vitals:  Filed Vitals:   04/13/15 0738 04/13/15 0922  BP: 150/87   Pulse: 58 80  Temp: 36.7 C   Resp: 20     Complications: No apparent anesthesia complications

## 2015-04-13 NOTE — Interval H&P Note (Signed)
History and Physical Interval Note:  04/13/2015 7:41 AM  Joshua Hall  has presented today for surgery, with the diagnosis of umbilical hernia  The various methods of treatment have been discussed with the patient and family. After consideration of risks, benefits and other options for treatment, the patient has consented to  Procedure(s):  UMBILICAL HERNIA REPAIR (N/A) INSERTION OF MESH (N/A) as a surgical intervention .  The patient's history has been reviewed, patient examined, no change in status, stable for surgery.  I have reviewed the patient's chart and labs.  Questions were answered to the patient's satisfaction.    The risk of hernia repair include bleeding,  Infection,   Recurrence of the hernia,  Mesh use, chronic pain,  Organ injury,  Bowel injury,  Bladder injury,   nerve injury with numbness around the incision,  Death,  and worsening of preexisting  medical problems.  The alternatives to surgery have been discussed as well..  Long term expectations of both operative and non operative treatments have been discussed.   The patient agrees to proceed.   [name]

## 2015-04-13 NOTE — H&P (View-Only) (Signed)
Joshua Hall 04/11/2015 2:05 PM Location: [facility] Patient #: [id number] DOB: August 01, 1955 Divorced / Language: Joshua Hall / Race: Black or African American Male  History of Present Illness Joshua Hall A. Joshua Frisk MD; 04/11/2015 3:08 PM) Patient words: Patient sent at the request of Dr. Oneida Alar for local pain. The patient is a 3 month history of periumbilical pain. The pain comes and goes. It is severe at times location is directly inside the umbilicus without radiation. No associated nausea or vomiting. He recently underwent a colonoscopy and upper endoscopy. He had mild gastritis and duodenitis noted. Denies any epigastric pain, nausea or vomiting. He denies heartburn today. He has a history of colitis but his colonoscopy was otherwise normal except for some hyperplastic polyps. Coughing sneezing or lifting does not exacerbate his abdominal pain. He is point tender at his umbilicus and that's where he points currently. No weight loss. He has been out of work since December try and work up was abdominal pain. He has good days and bad days. Denies blood in stool.  The patient is a 60 year old male.   Other Problems [name], [name]; 04/11/2015 2:06 PM) Arthritis Back Pain Chest pain Congestive Heart Failure Myocardial infarction Pulmonary Embolism / Blood Clot in Legs  Past Surgical History [name], [name]; 04/11/2015 2:06 PM) Bypass Surgery for Poor Blood Flow to Legs Colon Polyp Removal - Colonoscopy  Diagnostic Studies History [name], [name]; 04/11/2015 2:06 PM) Colonoscopy within last year  Allergies [name], [name]; 04/11/2015 2:06 PM) No Known Drug Allergies 04/11/2015  Medication History [name], [name]; 04/11/2015 2:08 PM) Albuterol (90MCG/ACT Aerosol Soln, Inhalation) Active. AmLODIPine Besylate (5MG  Tablet, Oral) Active. Aspirin (81MG  Tablet, Oral) Active. EpiPen (0.3 MG/0.3ML(1:1000) Device,  Intramuscular) Active. Nitroglycerin (0.2MG /HR Patch 24HR, Transdermal) Active. Nitroglycerin (0.4MG  Tab Sublingual, Sublingual) Active. Oxycodone-Acetaminophen (5-325MG  Tablet, Oral) Active. Pantoprazole Sodium (40MG  Tablet DR, Oral) Active. TraMADol HCl (50MG  Tablet, Oral) Active. Medications Reconciled  Social History [name], [name]; 04/11/2015 2:06 PM) Alcohol use Occasional alcohol use. Caffeine use Carbonated beverages, Coffee, Tea. Illicit drug use Prefer to discuss with provider. Tobacco use Current every day smoker.  Family History [name], [name]; 04/11/2015 2:06 PM) Alcohol Abuse Family Members In General. Colon Cancer Family Members In General. Colon Polyps Family Members In General. Diabetes Mellitus Brother, Daughter, Father, Mother, Sister. Heart Disease Brother, Father. Heart disease in male family member before age 7 Heart disease in male family member before age 15 Hypertension Brother, Daughter, Father, Mother, Sister. Prostate Cancer Family Members In General.     Review of Systems [name] CMA; 04/11/2015 2:06 PM) General Present- Appetite Loss, Fatigue and Weight Loss. Not Present- Chills, Fever, Night Sweats and Weight Gain. Skin Not Present- Change in Wart/Mole, Dryness, Hives, Jaundice, New Lesions, Non-Healing Wounds, Rash and Ulcer. HEENT Present- Hearing Loss, Nose Bleed and Sinus Pain. Not Present- Earache, Hoarseness, Oral Ulcers, Ringing in the Ears, Seasonal Allergies, Sore Throat, Visual Disturbances, Wears glasses/contact lenses and Yellow Eyes. Respiratory Present- Difficulty Breathing and Snoring. Not Present- Bloody sputum, Chronic Cough and Wheezing. Breast Not Present- Breast Mass, Breast Pain, Nipple Discharge and Skin Changes. Cardiovascular Present- Chest Pain, Leg Cramps and Shortness of Breath. Not Present- Difficulty Breathing Lying Down, Palpitations, Rapid Heart Rate and Swelling of  Extremities. Gastrointestinal Present- Abdominal Pain, Bloating, Change in Bowel Habits, Excessive gas, Gets full quickly at meals and Indigestion. Not Present- Bloody Stool, Chronic diarrhea, Constipation, Difficulty Swallowing, Hemorrhoids, Nausea, Rectal Pain and  Vomiting. Male Genitourinary Present- Blood in Urine, Impotence, Nocturia and Urine Leakage. Not Present- Change in Urinary Stream, Frequency, Painful Urination and Urgency. Musculoskeletal Present- Back Pain, Joint Pain, Joint Stiffness, Muscle Pain and Muscle Weakness. Not Present- Swelling of Extremities. Neurological Present- Headaches. Not Present- Decreased Memory, Fainting, Numbness, Seizures, Tingling, Tremor, Trouble walking and Weakness. Psychiatric Not Present- Anxiety, Bipolar, Change in Sleep Pattern, Depression, Fearful and Frequent crying. Endocrine Present- Cold Intolerance. Not Present- Excessive Hunger, Hair Changes, Heat Intolerance, Hot flashes and New Diabetes. Hematology Present- Easy Bruising and Excessive bleeding. Not Present- Gland problems, HIV and Persistent Infections.  Vitals [employer] R. Brooks CMA; 04/11/2015 2:05 PM) 04/11/2015 2:05 PM Weight: 217 lb Height: 72.5in Body Surface Area: 2.22 m Body Mass Index: 29.03 kg/m  BP: 136/84 (Sitting, Left Arm, Standard)      Physical Exam (Anesha Hackert A. Faithlynn Deeley MD; 04/11/2015 3:09 PM)  General Mental Status-Alert. General Appearance-Consistent with stated age. Hydration-Well hydrated. Voice-Normal.  Head and Neck Head-normocephalic, atraumatic with no lesions or palpable masses. Trachea-midline. Thyroid Gland Characteristics - normal size and consistency.  Chest and Lung Exam Chest and lung exam reveals -quiet, even and easy respiratory effort with no use of accessory muscles and on auscultation, normal breath sounds, no adventitious sounds and normal vocal resonance. Inspection Chest Wall - Normal. Back -  normal.  Cardiovascular Cardiovascular examination reveals -normal heart sounds, regular rate and rhythm with no murmurs and normal pedal pulses bilaterally.  Abdomen Note: Small reducible umbilical hernia. Point tender over the hernia but hernia not incarcerated. No rebound or guarding. Hernia defect small.  Negative Murphy sign. No organomegaly. No rebound or guarding.  Neurologic Neurologic evaluation reveals -alert and oriented x 3 with no impairment of recent or remote memory. Mental Status-Normal.  Musculoskeletal Normal Exam - Left-Upper Extremity Strength Normal and Lower Extremity Strength Normal. Normal Exam - Right-Upper Extremity Strength Normal and Lower Extremity Strength Normal.  Lymphatic Head & Neck  General Head & Neck Lymphatics: Bilateral - Description - Normal. Axillary  General Axillary Region: Bilateral - Description - Normal. Tenderness - Non Tender.    Assessment & Plan (Travion Ke A. Quinto Tippy MD; [id number] PM)  UMBILICAL HERNIA ([id number]) Impression: Discussed findings with patient. Patient had ultrasound back in 2015 which was otherwise normal. Reviewed his endoscopy reports with him as well. He is point tender over the small hernia and this appears to be the most likely etiology of his pain. I explained to him that if repairing his hernia did not improve his pain, he would need further investigation which included a CT scan, repeat gallbladder ultrasound possible HIDA study. He understands this may happen if his symptoms do not resolve with surgery. He would like to go ahead and proceed with repair his umbilical hernia as soon as possible. He understands risks and the likelihood of this helping his pain to be 90% successful. The risk of hernia repair include bleeding, infection, organ injury, bowel injury, bladder injury, nerve injury recurrent hernia, blood clots, worsening of underlying condition, chronic pain, mesh use, open surgery, death, and  the need for other operattions. Pt agrees to proceed  Current Plans You are being scheduled for surgery - Our schedulers will call you.  You should hear from our office's scheduling department within 5 working days about the location, date, and time of surgery. We try to make accommodations for patient's preferences in scheduling surgery, but sometimes the OR schedule or the surgeon's schedule prevents [place] from making those accommodations.  If you have  not heard from our office [phone]) in 5 working days, call the office and ask for your surgeon's nurse.  If you have other questions about your diagnosis, plan, or surgery, call the office and ask for your surgeon's nurse.  The anatomy & physiology of the abdominal wall was discussed. The pathophysiology of hernias was discussed. Natural history risks without surgery including progeressive enlargement, pain, incarceration, & strangulation was discussed. Contributors to complications such as smoking, obesity, diabetes, prior surgery, etc were discussed.  I feel the risks of no intervention will lead to serious problems that outweigh the operative risks; therefore, I recommended surgery to reduce and repair the hernia. I explained laparoscopic techniques with possible need for an open approach. I noted the probable use of mesh to patch and/or buttress the hernia repair  Risks such as bleeding, infection, abscess, need for further treatment, heart attack, death, and other risks were discussed. I noted a good likelihood this will help address the problem. Goals of post-operative recovery were discussed as well. Possibility that this will not correct all symptoms was explained. I stressed the importance of low-impact activity, aggressive pain control, avoiding constipation, & not pushing through pain to minimize risk of post-operative chronic pain or injury. Possibility of reherniation especially with smoking, obesity, diabetes, immunosuppression, and  other health conditions was discussed. We will work to minimize complications.  An educational handout further explaining the pathology & treatment options was given as well. Questions were answered. The patient expresses understanding & wishes to proceed with surgery.  Pt Education - [facility] Pain Control (Gross) Pt Education - [facility] Hernia Post-Op HCI (Gross): discussed with patient and provided information. The anatomy & physiology of the abdominal wall was discussed. The pathophysiology of hernias was discussed. Natural history risks without surgery including progeressive enlargement, pain, incarceration, & strangulation was discussed. Contributors to complications such as smoking, obesity, diabetes, prior surgery, etc were discussed.  I feel the risks of no intervention will lead to serious problems that outweigh the operative risks; therefore, I recommended surgery to reduce and repair the hernia. I explained laparoscopic techniques with possible need for an open approach. I noted the probable use of mesh to patch and/or buttress the hernia repair  Risks such as bleeding, infection, abscess, need for further treatment, heart attack, death, and other risks were discussed. I noted a good likelihood this will help address the problem. Goals of post-operative recovery were discussed as well. Possibility that this will not correct all symptoms was explained. I stressed the importance of low-impact activity, aggressive pain control, avoiding constipation, & not pushing through pain to minimize risk of post-operative chronic pain or injury. Possibility of reherniation especially with smoking, obesity, diabetes, immunosuppression, and other health conditions was discussed. We will work to minimize complications.  An educational handout further explaining the pathology & treatment options was given as well. Questions were answered. The patient expresses understanding & wishes to proceed with surgery.

## 2015-04-13 NOTE — Op Note (Signed)
Joshua Hall [birth year] [id number] 04/13/2015  Preoperative diagnosis: umbilical hernia   Postoperative diagnosis: same   Procedure: Umbilical Hernia Repair with mesh  Surgeon: [name], MD, FACS  Anesthesia: General and 0.25% marcaine    Clinical History and Indications: Pt presents with a 3 month history of periumbilical pain. Work up and exam reveal umbilical hernia.  Pt desires repair due to pain.  The risk of hernia repair include bleeding,  Infection,   Recurrence of the hernia,  Mesh use, chronic pain,  Organ injury,  Bowel injury,  Bladder injury,   nerve injury with numbness around the incision,  Death,  and worsening of preexisting  medical problems.  The alternatives to surgery have been discussed as well..  Long term expectations of both operative and non operative treatments have been discussed.   The patient agrees to proceed.  Procedure: The patient was seen in the preoperative area and the plans for the procedure reviewed again. He  had no further questions. I marked the area of the umbilicus as the operative site. He wishes to prodeed.  The patient was taken to the operating room and after satisfactory general anesthesia had been obtained the area was clipped as needed, prepped and draped. The timeout was performed.  I used some 0.25 % marcaine  anesthesia to help with postoperative pain management. This was infiltrated around the umbilical area and additional infiltrated as I worked.  A curvilinear incision was made on the inferior aspect of the umbilicus. The umbilical skin was elevated off of the hernia sac. The hernia sac was dissected free of the subcutaneous tissues. The hernia had a small amount of preperitoneal fat that I reduced.  A 4.3 cm circular Bard mesh was placed under the fascia and secured with 1 O Novafil.  The fascia defect was closed over the mesh with same suture.  The defect was 1 cm in maximal diameter.     Once the repair was complete the  incision was closed by using some 3-0 Vicryl subcutaneous and 4-0 Monocryl subcuticular sutures.  The patient tolerated the procedure well. There were no operative complications. There was minimal blood loss. All counts were correct. He was taken to the PACU in satisfactory condition.  [name] , MD, FACS 04/13/2015 9:15 AM

## 2015-04-13 NOTE — Anesthesia Preprocedure Evaluation (Addendum)
Anesthesia Evaluation  Patient identified by MRN, date of birth, ID band Patient awake    Reviewed: Allergy & Precautions, NPO status , Patient's Chart, lab work & pertinent test results  Airway Mallampati: II  TM Distance: >3 FB Neck ROM: Full    Dental  (+) Teeth Intact, Dental Advisory Given, Edentulous Upper, Edentulous Lower   Pulmonary COPD (patient denies), Current Smoker,    Pulmonary exam normal breath sounds clear to auscultation       Cardiovascular Exercise Tolerance: Good hypertension, Pt. on medications + angina + CAD and + Peripheral Vascular Disease (stent placement to the left SFA)  Normal cardiovascular exam Rhythm:Regular Rate:Normal  11/03/13 Echo: Study Conclusions  - Left ventricle: The cavity size was normal. Wall thickness wasnormal. Systolic function was normal. The estimated ejectionfraction was in the range of 60% to 65%. Wall motion was normal;there were no regional wall motion abnormalities. Leftventricular diastolic function parameters were normal. - Aortic valve: Valve area (VTI): 2.86 cm^2. Valve area (Vmax):2.56 cm^2. - Systemic veins: IVC is small, suggestive of low RA pressure andhypovolemia. - Technically adequate study.  cardiac catheterization in 2013 demonstrating nonobstructive disease of the LAD, with a 99% stenosis to a very small second diagonal branch, too small for intervention, and mild nonobstructive disease of the RCA, the left circumflex was normal.    Neuro/Psych negative neurological ROS  negative psych ROS   GI/Hepatic Neg liver ROS, GERD  Medicated,  Endo/Other  negative endocrine ROS  Renal/GU negative Renal ROS     Musculoskeletal negative musculoskeletal ROS (+)   Abdominal   Peds  Hematology negative hematology ROS (+)   Anesthesia Other Findings Day of surgery medications reviewed with the patient.  Reproductive/Obstetrics                          Anesthesia Physical Anesthesia Plan  ASA: III  Anesthesia Plan: General   Post-op Pain Management:    Induction: Intravenous  Airway Management Planned: Oral ETT  Additional Equipment:   Intra-op Plan:   Post-operative Plan: Extubation in OR  Informed Consent: I have reviewed the patients History and Physical, chart, labs and discussed the procedure including the risks, benefits and alternatives for the proposed anesthesia with the patient or authorized representative who has indicated his/her understanding and acceptance.   Dental advisory given  Plan Discussed with: CRNA  Anesthesia Plan Comments: (Risks/benefits of general anesthesia discussed with patient including risk of damage to teeth, lips, gum, and tongue, nausea/vomiting, allergic reactions to medications, and the possibility of heart attack, stroke and death.  All patient questions answered.  Patient wishes to proceed.)        Anesthesia Quick Evaluation

## 2015-04-13 NOTE — Discharge Instructions (Signed)
[facility] _______[facility] ° °UMBILICAL OR INGUINAL HERNIA REPAIR: POST OP INSTRUCTIONS ° °Always review your discharge instruction sheet given to you by the facility where your surgery was performed. °IF YOU HAVE DISABILITY OR FAMILY LEAVE FORMS, YOU MUST BRING THEM TO THE OFFICE FOR PROCESSING.   °DO NOT GIVE THEM TO YOUR DOCTOR. ° °1. A  prescription for pain medication may be given to you upon discharge.  Take your pain medication as prescribed, if needed.  If narcotic pain medicine is not needed, then you may take acetaminophen (Tylenol) or ibuprofen (Advil) as needed. °2. Take your usually prescribed medications unless otherwise directed. °3. If you need a refill on your pain medication, please contact your pharmacy.  They will contact our office to request authorization. Prescriptions will not be filled after 5 pm or on week-ends. °4. You should follow a light diet the first 24 hours after arrival home, such as soup and crackers, etc.  Be sure to include lots of fluids daily.  Resume your normal diet the day after surgery. °5. Most patients will experience some swelling and bruising around the umbilicus or in the groin and scrotum.  Ice packs and reclining will help.  Swelling and bruising can take several days to resolve.  °6. It is common to experience some constipation if taking pain medication after surgery.  Increasing fluid intake and taking a stool softener (such as Colace) will usually help or prevent this problem from occurring.  A mild laxative (Milk of Magnesia or Miralax) should be taken according to package directions if there are no bowel movements after 48 hours. °7. Unless discharge instructions indicate otherwise, you may remove your bandages 24-48 hours after surgery, and you may shower at that time.  You may have steri-strips (small skin tapes) in place directly over the incision.  These strips should be left on the skin for 7-10 days.  If your surgeon used skin glue on the  incision, you may shower in 24 hours.  The glue will flake off over the next 2-3 weeks.  Any sutures or staples will be removed at the office during your follow-up visit. °8. ACTIVITIES:  You may resume regular (light) daily activities beginning the next day--such as daily self-care, walking, climbing stairs--gradually increasing activities as tolerated.  You may have sexual intercourse when it is comfortable.  Refrain from any heavy lifting or straining until approved by your doctor. °a. You may drive when you are no longer taking prescription pain medication, you can comfortably wear a seatbelt, and you can safely maneuver your car and apply brakes. °b. RETURN TO WORK:  __________________________________________________________ °9. You should see your doctor in the office for a follow-up appointment approximately 2-3 weeks after your surgery.  Make sure that you call for this appointment within a day or two after you arrive home to insure a convenient appointment time. °10. OTHER INSTRUCTIONS:  __________________________________________________________________________________________________________________________________________________________________________________________  °WHEN TO CALL YOUR DOCTOR: °1. Fever over 101.0 °2. Inability to urinate °3. Nausea and/or vomiting °4. Extreme swelling or bruising °5. Continued bleeding from incision. °6. Increased pain, redness, or drainage from the incision ° °The clinic staff is available to answer your questions during regular business hours.  Please don’t hesitate to call and ask to speak to one of the nurses for clinical concerns.  If you have a medical emergency, go to the nearest emergency room or call 911.  A surgeon from [facility] is always on call at the hospital ° ° °  [address], [address], [place], [place]  [address] ? ° [address] [address], [place], [place]   [address] °[phone] ? [phone] ? FAX [phone] °Web site:  [email or web address] ° °Post Anesthesia Home Care Instructions ° °Activity: °Get plenty of rest for the remainder of the day. A responsible adult should stay with you for 24 hours following the procedure.  °For the next 24 hours, DO NOT: °-Drive a car °-Operate machinery °-Drink alcoholic beverages °-Take any medication unless instructed by your physician °-Make any legal decisions or sign important papers. ° °Meals: °Start with liquid foods such as gelatin or soup. Progress to regular foods as tolerated. Avoid greasy, spicy, heavy foods. If nausea and/or vomiting occur, drink only clear liquids until the nausea and/or vomiting subsides. Call your physician if vomiting continues. ° °Special Instructions/Symptoms: °Your throat may feel dry or sore from the anesthesia or the breathing tube placed in your throat during surgery. If this causes discomfort, gargle with warm salt water. The discomfort should disappear within 24 hours. ° °If you had a scopolamine patch placed behind your ear for the management of post- operative nausea and/or vomiting: ° °1. The medication in the patch is effective for 72 hours, after which it should be removed.  Wrap patch in a tissue and discard in the trash. Wash hands thoroughly with soap and water. °2. You may remove the patch earlier than 72 hours if you experience unpleasant side effects which may include dry mouth, dizziness or visual disturbances. °3. Avoid touching the patch. Wash your hands with soap and water after contact with the patch. °  ° °

## 2015-04-14 ENCOUNTER — Encounter (HOSPITAL_BASED_OUTPATIENT_CLINIC_OR_DEPARTMENT_OTHER): Payer: Self-pay | Admitting: Surgery

## 2015-05-20 ENCOUNTER — Encounter (HOSPITAL_BASED_OUTPATIENT_CLINIC_OR_DEPARTMENT_OTHER): Payer: Self-pay | Admitting: Surgery

## 2015-06-15 ENCOUNTER — Encounter: Payer: Self-pay | Admitting: Gastroenterology

## 2015-06-15 ENCOUNTER — Encounter (INDEPENDENT_AMBULATORY_CARE_PROVIDER_SITE_OTHER): Payer: PRIVATE HEALTH INSURANCE | Admitting: Gastroenterology

## 2015-06-15 ENCOUNTER — Telehealth: Payer: Self-pay | Admitting: Gastroenterology

## 2015-06-15 NOTE — Progress Notes (Signed)
   Subjective:    Patient ID: Joshua Hall, male    DOB: 1955/10/20, 60 y.o.   MRN: [id number]  HPI NO SHOW. NO CALL.  Past Medical History  Diagnosis Date  . Colitis   . Coronary atherosclerosis of native coronary artery     Largely nonobstructive in major epicardial was with 99% small diagonal stenosis - medical therapy  . COPD (chronic obstructive pulmonary disease) ([place])   . Hypertension   . Hyperlipidemia   . Tobacco use     Past Surgical History  Procedure Laterality Date  . Mouth surgery    . Cardiac catheterization  10/2011    [facility]  . Left heart catheterization with coronary angiogram N/A 10/19/2011    Procedure: LEFT HEART CATHETERIZATION WITH CORONARY ANGIOGRAM;  Surgeon: [name], MD;  Location: [facility] CATH LAB;  Service: Cardiovascular;  Laterality: N/A;  . Abdominal aortagram N/A 12/23/2013    Procedure: ABDOMINAL Maxcine Ham;  Surgeon: [name], MD;  Location: [facility] CATH LAB;  Service: Cardiovascular;  Laterality: N/A;  . Colonoscopy N/A 03/16/2015    SLF: 1. No source for abdominal pain identified. 2. six colorectal polyps removed. 3. the colon was redundant 4. mild diverticulosis noted in the sigmoid colon 5. small internal hemorroids.   . Esophagogastroduodenoscopy N/A 03/16/2015    SLF: 1. No source for abdominal pain identified. 2. mild non-erosive gastritis and duodentitis.   [name] Umbilical hernia repair N/A 04/13/2015    Procedure:  UMBILICAL HERNIA REPAIR;  Surgeon: [name], MD;  Location: [facility];  Service: General;  Laterality: N/A;  . Insertion of mesh N/A 04/13/2015    Procedure: INSERTION OF MESH;  Surgeon: [name], MD;  Location: [place];  Service: General;  Laterality: N/A;    Review of Systems     Objective:   Physical Exam        Assessment & Plan:

## 2015-06-15 NOTE — Telephone Encounter (Signed)
PATIENT WAS A NO SHOW AND LETTER SENT  °

## 2015-06-16 NOTE — Telephone Encounter (Signed)
REVIEWED-NO ADDITIONAL RECOMMENDATIONS. 

## 2015-07-14 ENCOUNTER — Ambulatory Visit: Payer: PRIVATE HEALTH INSURANCE | Admitting: Gastroenterology

## 2015-08-04 ENCOUNTER — Ambulatory Visit (INDEPENDENT_AMBULATORY_CARE_PROVIDER_SITE_OTHER): Payer: PRIVATE HEALTH INSURANCE | Admitting: Gastroenterology

## 2015-08-04 ENCOUNTER — Encounter: Payer: Self-pay | Admitting: Gastroenterology

## 2015-08-04 VITALS — BP 151/85 | HR 67 | Temp 98.8°F | Ht 72.0 in | Wt 211.4 lb

## 2015-08-04 DIAGNOSIS — K635 Polyp of colon: Secondary | ICD-10-CM | POA: Diagnosis not present

## 2015-08-04 DIAGNOSIS — K591 Functional diarrhea: Secondary | ICD-10-CM

## 2015-08-04 DIAGNOSIS — Z86010 Personal history of colonic polyps: Secondary | ICD-10-CM | POA: Diagnosis not present

## 2015-08-04 DIAGNOSIS — R1013 Epigastric pain: Secondary | ICD-10-CM

## 2015-08-04 DIAGNOSIS — R1033 Periumbilical pain: Secondary | ICD-10-CM

## 2015-08-04 NOTE — Assessment & Plan Note (Signed)
SYMPTOMS CONTROLLED/RESOLVED WITH UMBILICAL HERNIA REPAIR.  CONTINUE TO MONITOR SYMPTOMS.

## 2015-08-04 NOTE — Assessment & Plan Note (Signed)
TCS UTD-NO WARNING SIGNS/SYMPTOMS  CONTINUE EFFORTS TO STOP SMOKING TO REDUCE RISK OF CANCER-ESOPHAGEAL AND COLON.  DRINK WATER TO KEEP YOUR URINE LIGHT YELLOW. FOLLOW A HIGH FIBER DIET TO REDUCE YOUR RISK OF COLON CANCER. AVOID ITEMS THAT CAUSE BLOATING & GAS.   HANDOUT GIVEN. FOLLOW UP  AS NEEDED. CALL WITH QUESTIONS OR CONCERNS. NEXT COLONOSCOPY IN FEB 2027.

## 2015-08-04 NOTE — Progress Notes (Signed)
cc'ed to pcp °

## 2015-08-04 NOTE — Assessment & Plan Note (Signed)
SYMPTOMS CONTROLLED/RESOLVED.  CONTINUE TO MONITOR SYMPTOMS. 

## 2015-08-04 NOTE — Assessment & Plan Note (Signed)
SYMPTOMS CONTROLLED/RESOLVED.  CONTINUE TO MONITOR SYMPTOMS. OPV PRN

## 2015-08-04 NOTE — Patient Instructions (Addendum)
CONTINUE EFFORTS TO STOP SMOKING TO REDUCE YOUR RISK OF CANCER-ESOPHAGEAL AND COLON.  DRINK WATER TO KEEP YOUR URINE LIGHT YELLOW.  FOLLOW A HIGH FIBER DIET TO REDUCE YOUR RISK OF COLON CANCER. AVOID ITEMS THAT CAUSE BLOATING & GAS.  SEE INFO BELOW.  FOLLOW UP WITH DR. Veldon Wager AS NEEDED. PLEASE CALL WITH QUESTIONS OR CONCERNS.  NEXT COLONOSCOPY IN FEB 2027.  High-Fiber Diet A high-fiber diet changes your normal diet to include more whole grains, legumes, fruits, and vegetables. Changes in the diet involve replacing refined carbohydrates with unrefined foods. The calorie level of the diet is essentially unchanged. The Dietary Reference Intake (recommended amount) for adult males is 38 grams per day. For adult females, it is 25 grams per day. Pregnant and lactating women should consume 28 grams of fiber per day. Fiber is the intact part of a plant that is not broken down during digestion. Functional fiber is fiber that has been isolated from the plant to provide a beneficial effect in the body. PURPOSE  Increase stool bulk.   Ease and regulate bowel movements.   Lower cholesterol.  INDICATIONS THAT YOU NEED MORE FIBER  Constipation and hemorrhoids.   Uncomplicated diverticulosis (intestine condition) and irritable bowel syndrome.   Weight management.   As a protective measure against hardening of the arteries (atherosclerosis), diabetes, and cancer.   GUIDELINES FOR INCREASING FIBER IN THE DIET  Start adding fiber to the diet slowly. A gradual increase of about 5 more grams (2 slices of whole-wheat bread, 2 servings of most fruits or vegetables, or 1 bowl of high-fiber cereal) per day is best. Too rapid an increase in fiber may result in constipation, flatulence, and bloating.   Drink enough water and fluids to keep your urine clear or pale yellow. Water, juice, or caffeine-free drinks are recommended. Not drinking enough fluid may cause constipation.   Eat a variety of high-fiber  foods rather than one type of fiber.   Try to increase your intake of fiber through using high-fiber foods rather than fiber pills or supplements that contain small amounts of fiber.   The goal is to change the types of food eaten. Do not supplement your present diet with high-fiber foods, but replace foods in your present diet.   INCLUDE A VARIETY OF FIBER SOURCES  Replace refined and processed grains with whole grains, canned fruits with fresh fruits, and incorporate other fiber sources. White rice, white breads, and most bakery goods contain little or no fiber.   Brown whole-grain rice, buckwheat oats, and many fruits and vegetables are all good sources of fiber. These include: broccoli, Brussels sprouts, cabbage, cauliflower, beets, sweet potatoes, white potatoes (skin on), carrots, tomatoes, eggplant, squash, berries, fresh fruits, and dried fruits.   Cereals appear to be the richest source of fiber. Cereal fiber is found in whole grains and bran. Bran is the fiber-rich outer coat of cereal grain, which is largely removed in refining. In whole-grain cereals, the bran remains. In breakfast cereals, the largest amount of fiber is found in those with "bran" in their names. The fiber content is sometimes indicated on the label.   You may need to include additional fruits and vegetables each day.   In baking, for 1 cup white flour, you may use the following substitutions:   1 cup whole-wheat flour minus 2 tablespoons.   1/2 cup white flour plus 1/2 cup whole-wheat flour.

## 2015-08-04 NOTE — Progress Notes (Signed)
Subjective:    Patient ID: Joshua Hall, male    DOB: 28-May-1955, 60 y.o.   MRN: [id number]  Wende Neighbors, MD  HPI Had surgery MAR 1 AND NOW ABDOMINAL PAIN PRETTY MUCH RESOLVED BACK TO WORK MAR 30. BOWEL MOVE GOOD. HAS 31 HEAD OF CATTLE THAT HE CARES FOR BY HIMSELF. HARD TO FIND GOOD HELP. MILD AROUND [place]. WORKING ON QUITTING SMOKING. LISTENS TO BOOTSY COLLINS, BAR-KAYS, AND PARLIMENT. GOOD EXPERIENCE WITH DR. Brantley Stage.  PT DENIES FEVER, CHILLS, HEMATOCHEZIA, nausea, vomiting, melena, diarrhea, CHEST PAIN, SHORTNESS OF BREATH, CHANGE IN BOWEL IN HABITS, constipation, abdominal pain, problems swallowing, problems with sedation, OR heartburn or indigestion.  Past Medical History  Diagnosis Date  . Colitis   . Coronary atherosclerosis of native coronary artery     Largely nonobstructive in major epicardial was with 99% small diagonal stenosis - medical therapy  . COPD (chronic obstructive pulmonary disease) ([place])   . Hypertension   . Hyperlipidemia   . Tobacco use     Past Surgical History  Procedure Laterality Date  . Mouth surgery    . Cardiac catheterization  10/2011    [facility]  . Left heart catheterization with coronary angiogram N/A 10/19/2011    Procedure: LEFT HEART CATHETERIZATION WITH CORONARY ANGIOGRAM;  Surgeon: [name], MD;  Location: [facility] CATH LAB;  Service: Cardiovascular;  Laterality: N/A;  . Abdominal aortagram N/A 12/23/2013    Procedure: ABDOMINAL Maxcine Ham;  Surgeon: [name], MD;  Location: [place] CATH LAB;  Service: Cardiovascular;  Laterality: N/A;  . Colonoscopy N/A 03/16/2015    SLF: 1. No source for abdominal pain identified. 2. six colorectal polyps removed. 3. the colon was redundant 4. mild diverticulosis noted in the sigmoid colon 5. small internal hemorroids.   . Esophagogastroduodenoscopy N/A 03/16/2015    SLF: 1. No source for abdominal pain identified. 2. mild non-erosive gastritis and duodentitis.   [name] Umbilical hernia repair N/A  04/13/2015    Procedure:  UMBILICAL HERNIA REPAIR;  Surgeon: [name], MD;  Location: [place];  Service: General;  Laterality: N/A;  . Insertion of mesh N/A 04/13/2015    Procedure: INSERTION OF MESH;  Surgeon: [name], MD;  Location: [place];  Service: General;  Laterality: N/A;    Allergies  Allergen Reactions  . Bee Venom Swelling   Current Outpatient Prescriptions  Medication Sig Dispense Refill  . albuterol inhaler Inhale 2 puffs into the lungs Q6H PRN for wheezing or shortness of breath.    [name] amLODipine (NORVASC) 5 MG tablet Take 1 tablet (5 mg total) by mouth daily.    [name] buPROPion 150 MG 24 hr tablet TK 1 T PO D    . EPIPEN injection Inject 0.3 mg as directed daily as needed. Allergic reaction    . nitroGLYCERIN patch Place 0.2 mg onto the skin daily.    . nitroGLYCERIN 0.4 MG SL tablet SL every 5 (five) minutes as needed for chest pain (3 doses MAX).    [name] ondansetron 4 MG disintegrating tablet 4mg  ODT q4 hours prn nausea/vomit    . ROXICET 5-325 MG tablet Take 1 tablet by mouth every 4 (four) hours as needed.    [name] PROTONIX 40 MG tablet Take 1 tablet (40 mg total) by mouth daily.    . CRESTOR 20 MG tablet TK 1 T PO D    . traMADol (ULTRAM) 50 MG tablet TAKE 1 TABLET BY MOUTH EVERY 6 HOURS AS NEEDED  FOR PAIN.    [name]      Review of Systems PER HPI OTHERWISE ALL SYSTEMS ARE NEGATIVE.    Objective:   Physical Exam  Constitutional: He is oriented to person, place, and time. He appears well-developed and well-nourished. No distress.  HENT:  Head: Normocephalic and atraumatic.  Mouth/Throat: Oropharynx is clear and moist. No oropharyngeal exudate.  Eyes: Pupils are equal, round, and reactive to light. No scleral icterus.  Neck: Normal range of motion. Neck supple.  Cardiovascular: Normal rate, regular rhythm and normal heart sounds.   Pulmonary/Chest: Effort normal and breath sounds normal. No respiratory distress.  Abdominal: Soft.  Bowel sounds are normal. He exhibits no distension. There is tenderness. There is no rebound and no guarding.  MILD PERI-UMBILICAL TTP,INCISIONS WELL HEALED  Musculoskeletal: He exhibits no edema.  Lymphadenopathy:    He has no cervical adenopathy.  Neurological: He is alert and oriented to person, place, and time.  Psychiatric: He has a normal mood and affect.  Vitals reviewed.         Assessment & Plan:

## 2015-08-06 IMAGING — CT CT ABD-PELV W/ CM
2 of 5 series · 16 of 46 positions shown, 18 images · IV contrast (Omnipaque 300)
Comparison: 09/06/2009

CLINICAL DATA: Diffuse abdominal pain since 12/14/2013. History of
colitis.

EXAM:
CT ABDOMEN AND PELVIS WITH CONTRAST
TECHNIQUE: Multidetector CT imaging of the abdomen and pelvis was performed
using the standard protocol following bolus administration of
intravenous contrast.
CONTRAST:  50mL OMNIPAQUE IOHEXOL 300 MG/ML SOLN, 100mL OMNIPAQUE
IOHEXOL 300 MG/ML SOLN

[Series 2: abd_pel_with 5.0 b40f · axial · 0.78mm/px · z∈[-492,-72]mm · 13 of 96 slices shown, 15 images]
[im 6/96  soft-tissue]
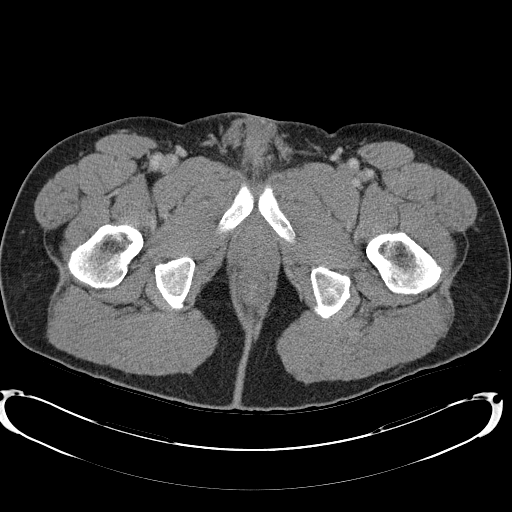
[im 6/96  bone]
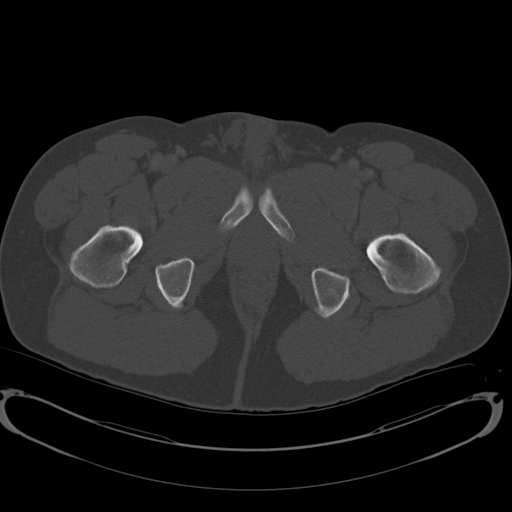
[im 11/96  soft-tissue]
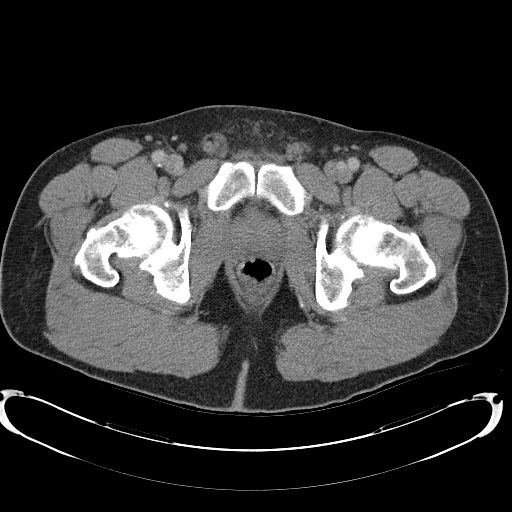
[im 22/96  soft-tissue]
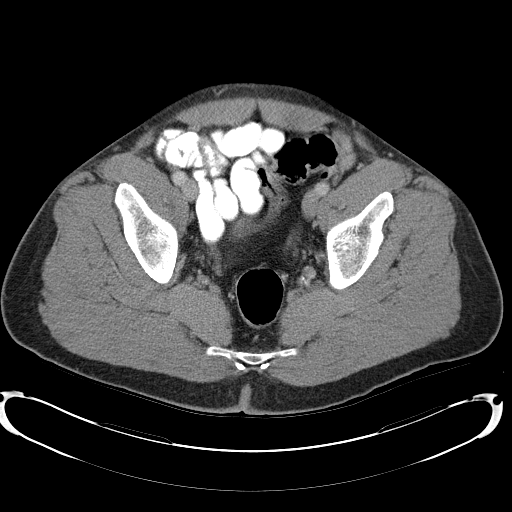
[im 27/96  soft-tissue]
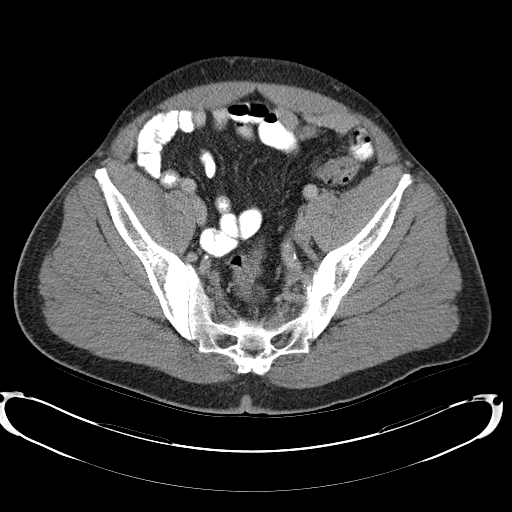
[im 32/96  soft-tissue]
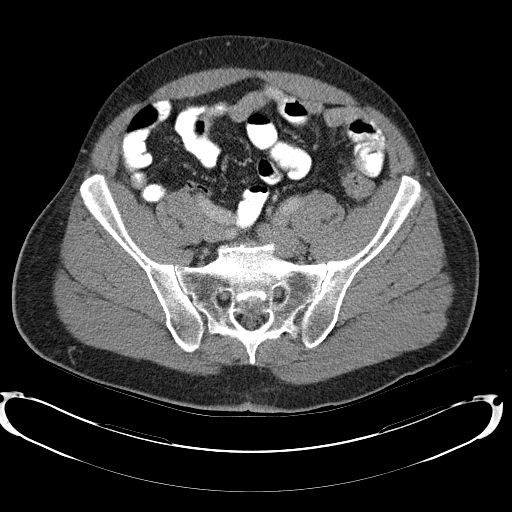
[im 43/96  soft-tissue]
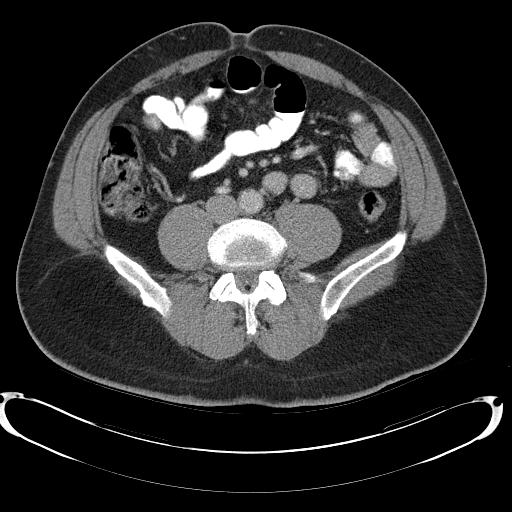
[im 48/96  soft-tissue]
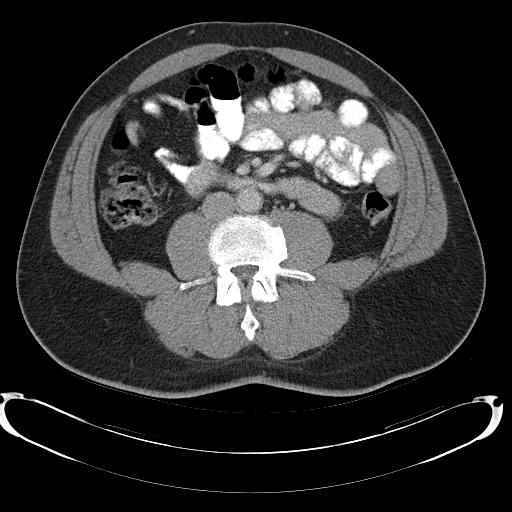
[im 53/96  soft-tissue]
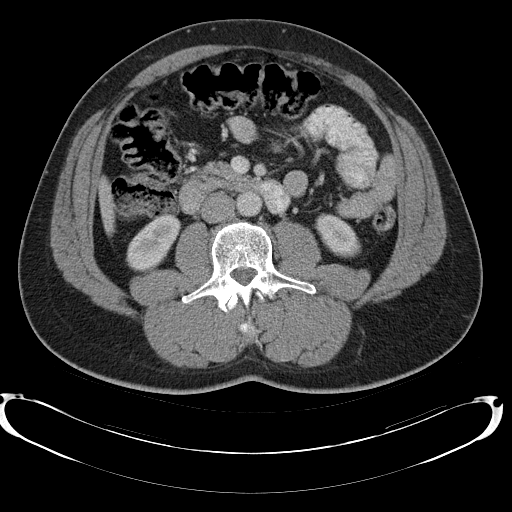
[im 64/96  soft-tissue]
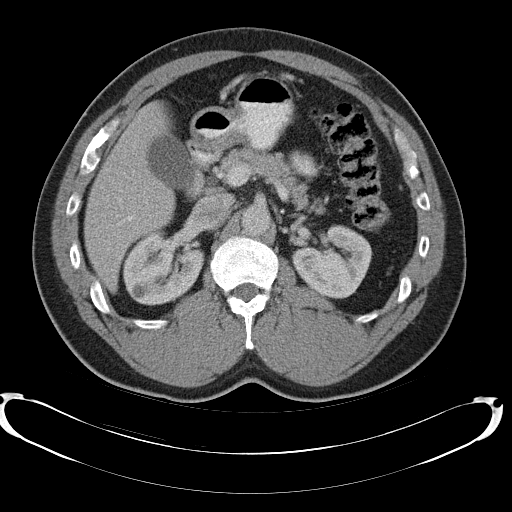
[im 64/96  bone]
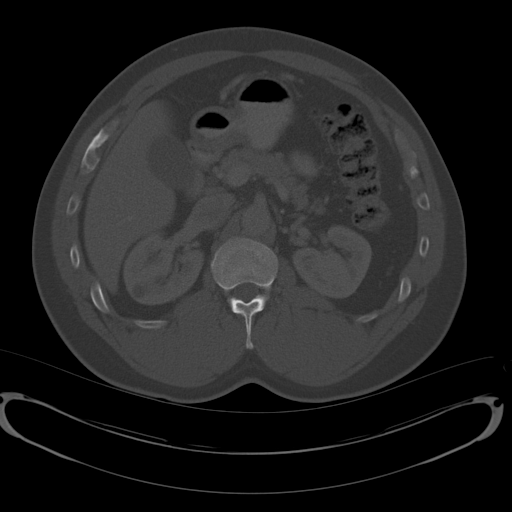
[im 69/96  soft-tissue]
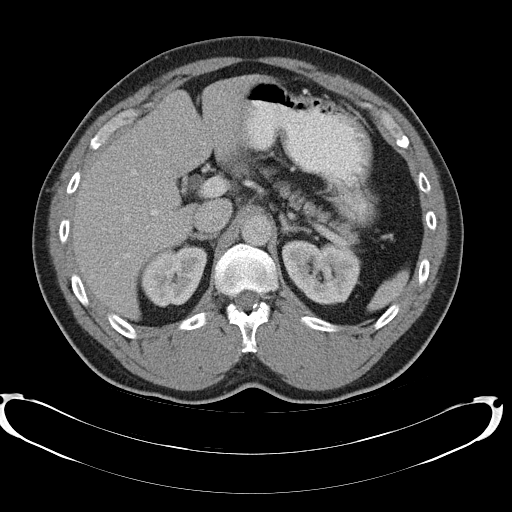
[im 74/96  soft-tissue]
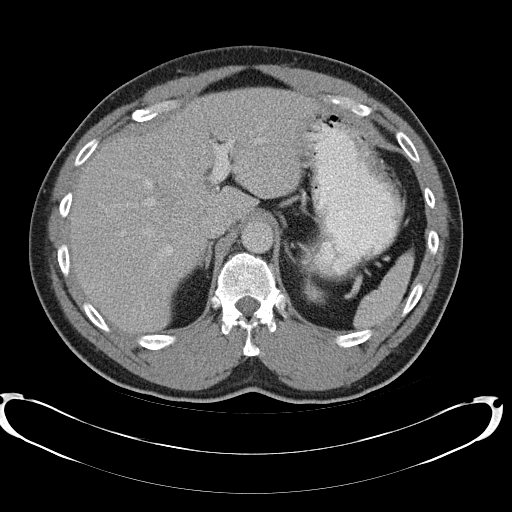
[im 85/96  soft-tissue]
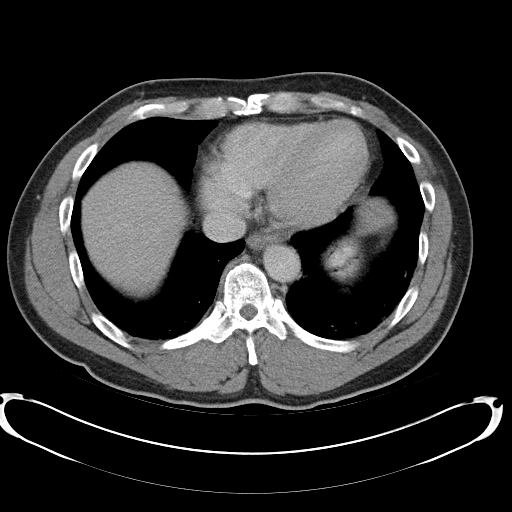
[im 90/96  soft-tissue]
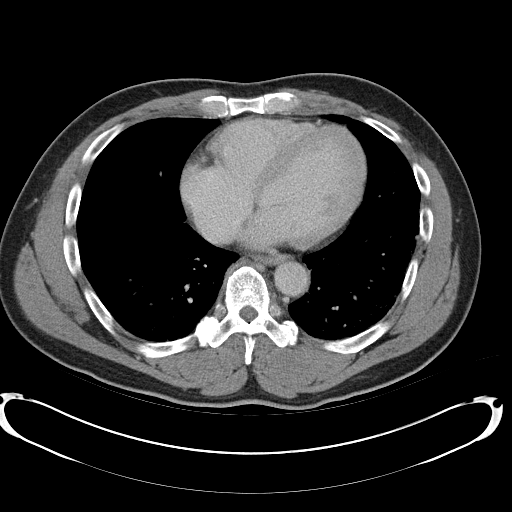

[Series 3: abd_pel_with 3.0 spo · coronal · 0.76mm/px · 3 of 103 slices shown]
[im 35/103  soft-tissue]
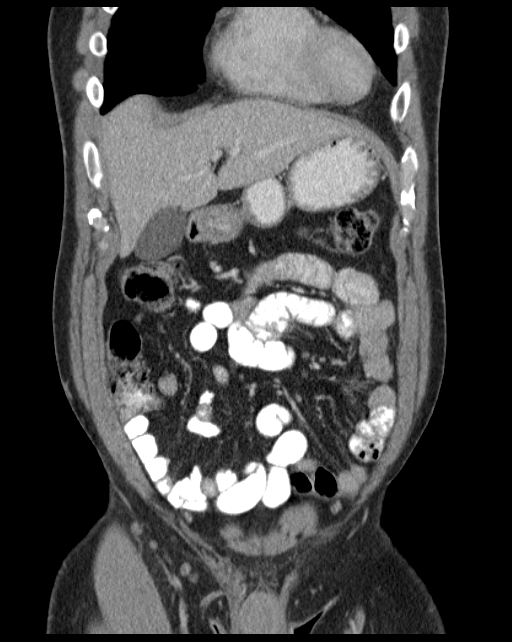
[im 46/103  soft-tissue]
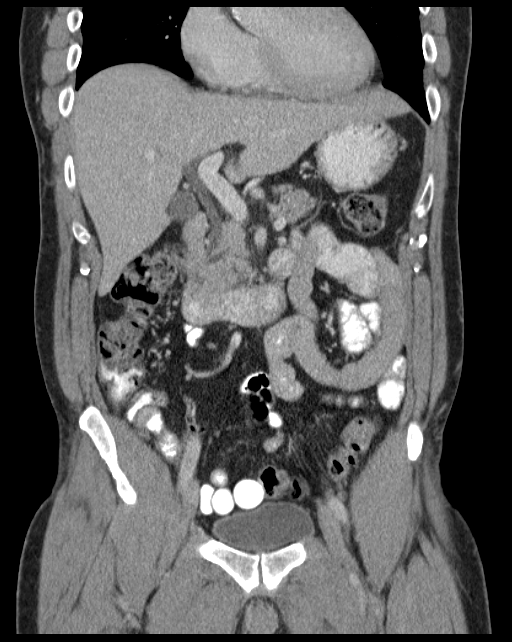
[im 57/103  soft-tissue]
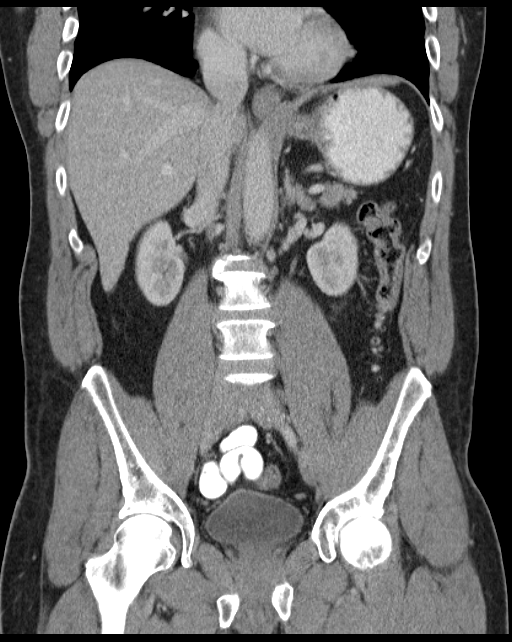

[16 of 46 positions shown; findings below may reference images not displayed]

FINDINGS: Dependent atelectasis at the lung bases. Negative for free
intraperitoneal air.

Normal appearance of the liver, gallbladder and portal venous
system. No acute abnormality in the pancreas. Normal appearance of
the spleen and adrenal glands. Normal appearance of left kidney.
There are at least 3 right renal cysts. 1.2 cm cystic structure
along the medial right kidney is new from the previous examination.
No significant free fluid or lymphadenopathy in the abdomen or
pelvis.

Normal appearance of the urinary bladder and prostate. Appendix is
normal. Normal appearance of the small and large bowel. There is
marked sclerosis along the sacroiliac joints bilaterally and similar
to the previous examination. No acute bone abnormality.
IMPRESSION: No acute abnormality in the abdomen or pelvis.

Right renal cysts.

## 2015-08-30 ENCOUNTER — Ambulatory Visit: Payer: PRIVATE HEALTH INSURANCE | Admitting: Cardiovascular Disease

## 2015-09-01 ENCOUNTER — Telehealth: Payer: Self-pay | Admitting: Gastroenterology

## 2015-09-01 NOTE — Telephone Encounter (Signed)
Called patient TO DISCUSS CONCERN.S HE DROPPED OFF PAPERWORK. WILL NEED TO KNOW WHAT TIME FRAME HE NEEDS PAPERWORK FOR.

## 2015-09-12 NOTE — Telephone Encounter (Signed)
I tried to call the patient, no answer,lmom 

## 2015-09-12 NOTE — Telephone Encounter (Signed)
PLEASE CALL PT. PT RETURNED CALL. NEEDS DISABILITY PAPERWORK COMPLETE FROM JAN 18-FEB 27. PAPERWORK COMPLETE.

## 2015-09-13 NOTE — Telephone Encounter (Signed)
Joshua Hall, please make sure we add the $29 fee for his disability paperwork when he arrives

## 2015-09-13 NOTE — Telephone Encounter (Signed)
I tried to call the patient, no answer,lmom 

## 2015-09-13 NOTE — Telephone Encounter (Signed)
FEE ENTERED INTO PT CHART

## 2015-10-04 ENCOUNTER — Ambulatory Visit: Payer: PRIVATE HEALTH INSURANCE | Admitting: Cardiovascular Disease

## 2016-08-30 ENCOUNTER — Ambulatory Visit: Payer: PRIVATE HEALTH INSURANCE | Admitting: Gastroenterology

## 2016-08-30 ENCOUNTER — Encounter: Payer: Self-pay | Admitting: Gastroenterology

## 2016-08-30 ENCOUNTER — Telehealth: Payer: Self-pay | Admitting: Gastroenterology

## 2016-08-30 NOTE — Telephone Encounter (Signed)
REVIEWED-NO ADDITIONAL RECOMMENDATIONS. 

## 2016-08-30 NOTE — Telephone Encounter (Signed)
PATIENT WAS A NO SHOW AND LETTER SENT  °

## 2016-08-30 NOTE — Progress Notes (Deleted)
   Subjective:    Patient ID: Joshua Hall, male    DOB: [birth year], 61 y.o.   MRN: [id number] Celene Squibb, MD   HPI   PT DENIES FEVER, CHILLS, HEMATOCHEZIA, HEMATEMESIS, nausea, vomiting, melena, diarrhea, CHEST PAIN, SHORTNESS OF BREATH,  CHANGE IN BOWEL IN HABITS, constipation, abdominal pain, problems swallowing, problems with sedation, heartburn or indigestion.  Past Medical History:  Diagnosis Date  . Colitis   . COPD (chronic obstructive pulmonary disease) ([place])   . Coronary atherosclerosis of native coronary artery    Largely nonobstructive in major epicardial was with 99% small diagonal stenosis - medical therapy  . Hyperlipidemia   . Hypertension   . Tobacco use     Past Surgical History:  Procedure Laterality Date  . ABDOMINAL AORTAGRAM N/A 12/23/2013   Procedure: ABDOMINAL Maxcine Ham;  Surgeon: [name], MD;  Location: [facility] CATH LAB;  Service: Cardiovascular;  Laterality: N/A;  . CARDIAC CATHETERIZATION  10/2011   [facility]  . COLONOSCOPY N/A 03/16/2015   SLF: 1. No source for abdominal pain identified. 2. six colorectal polyps removed. 3. the colon was redundant 4. mild diverticulosis noted in the sigmoid colon 5. small internal hemorroids.   . ESOPHAGOGASTRODUODENOSCOPY N/A 03/16/2015   SLF: 1. No source for abdominal pain identified. 2. mild non-erosive gastritis and duodentitis.   . INSERTION OF MESH N/A 04/13/2015   Procedure: INSERTION OF MESH;  Surgeon: [name], MD;  Location: [place];  Service: General;  Laterality: N/A;  . LEFT HEART CATHETERIZATION WITH CORONARY ANGIOGRAM N/A 10/19/2011   Procedure: LEFT HEART CATHETERIZATION WITH CORONARY ANGIOGRAM;  Surgeon: [name], MD;  Location: [facility] CATH LAB;  Service: Cardiovascular;  Laterality: N/A;  . MOUTH SURGERY    . UMBILICAL HERNIA REPAIR N/A 04/13/2015   Procedure:  UMBILICAL HERNIA REPAIR;  Surgeon: [name], MD;  Location: [place];  Service:  General;  Laterality: N/A;    Allergies  Allergen Reactions  . Bee Venom Swelling      Review of Systems PER HPI OTHERWISE ALL SYSTEMS ARE NEGATIVE.    Objective:   Physical Exam  Constitutional: He is oriented to person, place, and time. He appears well-developed and well-nourished. No distress.  HENT:  Head: Normocephalic and atraumatic.  Mouth/Throat: Oropharynx is clear and moist. No oropharyngeal exudate.  Eyes: Pupils are equal, round, and reactive to light. No scleral icterus.  Neck: Normal range of motion. Neck supple.  Cardiovascular: Normal rate, regular rhythm and normal heart sounds.   Pulmonary/Chest: Effort normal and breath sounds normal. No respiratory distress.  Abdominal: Soft. Bowel sounds are normal. He exhibits no distension. There is no tenderness.  Musculoskeletal: He exhibits no edema.  Lymphadenopathy:    He has no cervical adenopathy.  Neurological: He is alert and oriented to person, place, and time.  Psychiatric: He has a normal mood and affect.  Vitals reviewed.         Assessment & Plan:

## 2016-09-05 ENCOUNTER — Encounter (HOSPITAL_COMMUNITY): Payer: Self-pay | Admitting: Emergency Medicine

## 2016-09-05 ENCOUNTER — Observation Stay (HOSPITAL_COMMUNITY)
Admission: EM | Admit: 2016-09-05 | Discharge: 2016-09-06 | Disposition: A | Discharge status: Home / Self Care | Payer: PRIVATE HEALTH INSURANCE | Attending: Internal Medicine | Admitting: Internal Medicine

## 2016-09-05 ENCOUNTER — Emergency Department (HOSPITAL_COMMUNITY): Payer: PRIVATE HEALTH INSURANCE

## 2016-09-05 ENCOUNTER — Observation Stay (HOSPITAL_BASED_OUTPATIENT_CLINIC_OR_DEPARTMENT_OTHER): Payer: PRIVATE HEALTH INSURANCE

## 2016-09-05 DIAGNOSIS — R251 Tremor, unspecified: Secondary | ICD-10-CM | POA: Diagnosis not present

## 2016-09-05 DIAGNOSIS — E782 Mixed hyperlipidemia: Secondary | ICD-10-CM | POA: Diagnosis not present

## 2016-09-05 DIAGNOSIS — I2510 Atherosclerotic heart disease of native coronary artery without angina pectoris: Secondary | ICD-10-CM | POA: Insufficient documentation

## 2016-09-05 DIAGNOSIS — Z8249 Family history of ischemic heart disease and other diseases of the circulatory system: Secondary | ICD-10-CM | POA: Diagnosis not present

## 2016-09-05 DIAGNOSIS — Z8719 Personal history of other diseases of the digestive system: Secondary | ICD-10-CM | POA: Diagnosis not present

## 2016-09-05 DIAGNOSIS — I252 Old myocardial infarction: Secondary | ICD-10-CM | POA: Insufficient documentation

## 2016-09-05 DIAGNOSIS — R5383 Other fatigue: Secondary | ICD-10-CM | POA: Diagnosis not present

## 2016-09-05 DIAGNOSIS — Z86010 Personal history of colonic polyps: Secondary | ICD-10-CM | POA: Diagnosis not present

## 2016-09-05 DIAGNOSIS — J449 Chronic obstructive pulmonary disease, unspecified: Secondary | ICD-10-CM | POA: Insufficient documentation

## 2016-09-05 DIAGNOSIS — Z79899 Other long term (current) drug therapy: Secondary | ICD-10-CM | POA: Insufficient documentation

## 2016-09-05 DIAGNOSIS — R0600 Dyspnea, unspecified: Secondary | ICD-10-CM | POA: Insufficient documentation

## 2016-09-05 DIAGNOSIS — F17210 Nicotine dependence, cigarettes, uncomplicated: Secondary | ICD-10-CM | POA: Diagnosis not present

## 2016-09-05 DIAGNOSIS — R61 Generalized hyperhidrosis: Secondary | ICD-10-CM | POA: Insufficient documentation

## 2016-09-05 DIAGNOSIS — R079 Chest pain, unspecified: Secondary | ICD-10-CM

## 2016-09-05 DIAGNOSIS — R072 Precordial pain: Secondary | ICD-10-CM | POA: Diagnosis present

## 2016-09-05 DIAGNOSIS — E7800 Pure hypercholesterolemia, unspecified: Secondary | ICD-10-CM | POA: Insufficient documentation

## 2016-09-05 DIAGNOSIS — I739 Peripheral vascular disease, unspecified: Secondary | ICD-10-CM | POA: Diagnosis present

## 2016-09-05 DIAGNOSIS — Z7982 Long term (current) use of aspirin: Secondary | ICD-10-CM | POA: Insufficient documentation

## 2016-09-05 DIAGNOSIS — Z91030 Bee allergy status: Secondary | ICD-10-CM | POA: Diagnosis not present

## 2016-09-05 DIAGNOSIS — Z95820 Peripheral vascular angioplasty status with implants and grafts: Secondary | ICD-10-CM | POA: Insufficient documentation

## 2016-09-05 DIAGNOSIS — I10 Essential (primary) hypertension: Secondary | ICD-10-CM | POA: Insufficient documentation

## 2016-09-05 DIAGNOSIS — R51 Headache: Secondary | ICD-10-CM | POA: Insufficient documentation

## 2016-09-05 DIAGNOSIS — R0789 Other chest pain: Secondary | ICD-10-CM

## 2016-09-05 DIAGNOSIS — Z720 Tobacco use: Secondary | ICD-10-CM | POA: Diagnosis present

## 2016-09-05 HISTORY — DX: Peripheral vascular disease, unspecified: I73.9

## 2016-09-05 LAB — ECHOCARDIOGRAM COMPLETE
CHL CUP DOP CALC LVOT VTI: 20.4 cm
CHL CUP MV DEC (S): 243
CHL CUP RV SYS PRESS: 34 mmHg
CHL CUP STROKE VOLUME: 75 mL
CHL CUP TV REG PEAK VELOCITY: 257 cm/s
E decel time: 243 msec
EERAT: 7.02
FS: 27 % — AB (ref 28–44)
Height: 72 in
IV/PV OW: 1.01
LA ID, A-P, ES: 37 mm
LA diam index: 1.86 cm/m2
LA vol A4C: 59.5 ml
LA vol index: 30.6 mL/m2
LAVOL: 61 mL
LDCA: 3.46 cm2
LEFT ATRIUM END SYS DIAM: 37 mm
LV PW d: 13.7 mm — AB (ref 0.6–1.1)
LV TDI E'LATERAL: 12.3
LV e' LATERAL: 12.3 cm/s
LV sys vol: 39 mL (ref 21–61)
LVDIAVOL: 114 mL (ref 62–150)
LVDIAVOLIN: 57 mL/m2
LVEEAVG: 7.02
LVEEMED: 7.02
LVOT SV: 71 mL
LVOT peak grad rest: 4 mmHg
LVOTD: 21 mm
LVOTPV: 98.7 cm/s
LVSYSVOLIN: 20 mL/m2
MV pk E vel: 86.4 m/s
MVPG: 3 mmHg
MVPKAVEL: 60.3 m/s
RV LATERAL S' VELOCITY: 11 cm/s
RV TAPSE: 19.5 mm
Simpson's disk: 66
TDI e' medial: 9.14
TR max vel: 257 cm/s
Weight: 2747.81 oz

## 2016-09-05 LAB — CBC
HCT: 33.8 % — ABNORMAL LOW (ref 39.0–52.0)
HEMATOCRIT: 36.4 % — AB (ref 39.0–52.0)
HEMOGLOBIN: 12.6 g/dL — AB (ref 13.0–17.0)
Hemoglobin: 13.5 g/dL (ref 13.0–17.0)
MCH: 31.6 pg (ref 26.0–34.0)
MCH: 31.7 pg (ref 26.0–34.0)
MCHC: 37.1 g/dL — AB (ref 30.0–36.0)
MCHC: 37.3 g/dL — AB (ref 30.0–36.0)
MCV: 85.2 fL (ref 78.0–100.0)
MCV: 85.1 fL (ref 78.0–100.0)
Platelets: 243 10*3/uL (ref 150–400)
Platelets: 223 10*3/uL (ref 150–400)
RBC: 4.27 MIL/uL (ref 4.22–5.81)
RBC: 3.97 MIL/uL — ABNORMAL LOW (ref 4.22–5.81)
RDW: 13.7 % (ref 11.5–15.5)
RDW: 13.6 % (ref 11.5–15.5)
WBC: 7.1 10*3/uL (ref 4.0–10.5)
WBC: 6.8 10*3/uL (ref 4.0–10.5)

## 2016-09-05 LAB — TROPONIN I
TROPONIN I: 0.03 ng/mL — AB (ref <0.03)
Troponin I: 0.03 ng/mL (ref <0.03)
Troponin I: 0.03 ng/mL (ref <0.03)

## 2016-09-05 LAB — BASIC METABOLIC PANEL
Anion gap: 8 (ref 5–15)
BUN: 16 mg/dL (ref 6–20)
CALCIUM: 9.3 mg/dL (ref 8.9–10.3)
CO2: 27 mmol/L (ref 22–32)
Chloride: 102 mmol/L (ref 101–111)
Creatinine, Ser: 1.02 mg/dL (ref 0.61–1.24)
GFR calc Af Amer: >60 mL/min (ref >60)
GLUCOSE: 116 mg/dL — AB (ref 65–99)
POTASSIUM: 3.6 mmol/L (ref 3.5–5.1)
Sodium: 137 mmol/L (ref 135–145)

## 2016-09-05 LAB — CREATININE, SERUM
CREATININE: 0.89 mg/dL (ref 0.61–1.24)
GFR calc Af Amer: >60 mL/min (ref >60)
GFR calc non Af Amer: >60 mL/min (ref >60)

## 2016-09-05 LAB — MRSA PCR SCREENING: MRSA by PCR: POSITIVE — AB

## 2016-09-05 LAB — I-STAT TROPONIN, ED: Troponin i, poc: 0.02 ng/mL (ref 0.00–0.08)

## 2016-09-05 MED ORDER — MORPHINE SULFATE (PF) 4 MG/ML IV SOLN
4.0000 mg | Freq: Once | INTRAVENOUS | Status: AC
  Administered 2016-09-05: 4 mg via INTRAVENOUS
  Filled 2016-09-05: qty 1

## 2016-09-05 MED ORDER — GI COCKTAIL ~~LOC~~: 30.0000 mL | Freq: Four times a day (QID) | ORAL | Status: DC | PRN

## 2016-09-05 MED ORDER — NITROGLYCERIN 2 % TD OINT
1.0000 [in_us] | TOPICAL_OINTMENT | Freq: Once | TRANSDERMAL | Status: AC
  Administered 2016-09-05: 1 [in_us] via TOPICAL
  Filled 2016-09-05: qty 1

## 2016-09-05 MED ORDER — ACETAMINOPHEN 325 MG PO TABS
650.0000 mg | ORAL_TABLET | ORAL | Status: DC | PRN
  Administered 2016-09-05 (×2): 650 mg via ORAL
  Filled 2016-09-05 (×2): qty 2

## 2016-09-05 MED ORDER — ONDANSETRON HCL 4 MG/2ML IJ SOLN: 4.0000 mg | Freq: Four times a day (QID) | INTRAMUSCULAR | Status: DC | PRN

## 2016-09-05 MED ORDER — ROSUVASTATIN CALCIUM 10 MG PO TABS
10.0000 mg | ORAL_TABLET | Freq: Every day | ORAL | Status: DC
  Administered 2016-09-05: 10 mg via ORAL
  Filled 2016-09-05: qty 1

## 2016-09-05 MED ORDER — ALBUTEROL SULFATE (2.5 MG/3ML) 0.083% IN NEBU: 2.5000 mg | INHALATION_SOLUTION | Freq: Four times a day (QID) | RESPIRATORY_TRACT | Status: DC | PRN

## 2016-09-05 MED ORDER — NITROGLYCERIN 0.4 MG SL SUBL: 0.4000 mg | SUBLINGUAL_TABLET | SUBLINGUAL | Status: DC | PRN

## 2016-09-05 MED ORDER — ALBUTEROL SULFATE HFA 108 (90 BASE) MCG/ACT IN AERS: 2.0000 | INHALATION_SPRAY | Freq: Four times a day (QID) | RESPIRATORY_TRACT | Status: DC | PRN

## 2016-09-05 MED ORDER — METOPROLOL TARTRATE 25 MG PO TABS
12.5000 mg | ORAL_TABLET | Freq: Two times a day (BID) | ORAL | Status: DC
  Administered 2016-09-05 (×2): 12.5 mg via ORAL
  Filled 2016-09-05 (×2): qty 1

## 2016-09-05 MED ORDER — ASPIRIN 325 MG PO TABS
325.0000 mg | ORAL_TABLET | Freq: Once | ORAL | Status: AC
  Administered 2016-09-05: 325 mg via ORAL
  Filled 2016-09-05: qty 1

## 2016-09-05 MED ORDER — ONDANSETRON 4 MG PO TBDP
4.0000 mg | ORAL_TABLET | Freq: Three times a day (TID) | ORAL | Status: DC | PRN
  Filled 2016-09-05: qty 1

## 2016-09-05 MED ORDER — HEPARIN SODIUM (PORCINE) 5000 UNIT/ML IJ SOLN
5000.0000 [IU] | Freq: Three times a day (TID) | INTRAMUSCULAR | Status: DC
  Administered 2016-09-05 – 2016-09-06 (×4): 5000 [IU] via SUBCUTANEOUS
  Filled 2016-09-05 (×4): qty 1

## 2016-09-05 MED ORDER — PANTOPRAZOLE SODIUM 40 MG PO TBEC
40.0000 mg | DELAYED_RELEASE_TABLET | Freq: Every day | ORAL | Status: DC
  Administered 2016-09-05 – 2016-09-06 (×2): 40 mg via ORAL
  Filled 2016-09-05 (×2): qty 1

## 2016-09-05 MED ORDER — AMLODIPINE BESYLATE 5 MG PO TABS
5.0000 mg | ORAL_TABLET | Freq: Every day | ORAL | Status: DC
  Administered 2016-09-05: 5 mg via ORAL
  Filled 2016-09-05: qty 1

## 2016-09-05 MED ORDER — BUPROPION HCL ER (XL) 150 MG PO TB24
150.0000 mg | ORAL_TABLET | Freq: Every day | ORAL | Status: DC
  Administered 2016-09-05 – 2016-09-06 (×2): 150 mg via ORAL
  Filled 2016-09-05 (×3): qty 1

## 2016-09-05 MED ORDER — ASPIRIN EC 81 MG PO TBEC
81.0000 mg | DELAYED_RELEASE_TABLET | Freq: Every day | ORAL | Status: DC
  Administered 2016-09-06: 81 mg via ORAL
  Filled 2016-09-05: qty 1

## 2016-09-05 NOTE — ED Provider Notes (Signed)
[facility] DEPT Provider Note   CSN: [id number] Arrival date & time: 09/05/16  1884     History   Chief Complaint Chief Complaint  Patient presents with  . Chest Pain    HPI Joshua Hall is a 61 y.o. male.  HPI  This is a 61 year old male who presents with chest pain. States onset of chest pain after reporting to work just past 5 AM. He reports that it is pressure-like and sharp. It radiates across his chest. Nothing seems to make it better or worse. He was not active during that time. He also reports headache and general fatigue. Endorses shortness of breath. No nausea or vomiting. States he's had similar pain in the past when he had a heart attack. Current pain is 8 out of 10. Denies recent travel, lower leg swelling, history of blood clots. Patient continues to smoke.  Most recent cardiac catheterization in 2013 demonstrating nonobstructive disease of the LAD, with a 99% stenosis to a very small second diagonal branch, too small for intervention, and mild nonobstructive disease of the RCA, the left circumflex was normal.  Medical management was recommended.  Past Medical History:  Diagnosis Date  . Colitis   . COPD (chronic obstructive pulmonary disease) ([place])   . Coronary atherosclerosis of native coronary artery    Largely nonobstructive in major epicardial was with 99% small diagonal stenosis - medical therapy  . Hyperlipidemia   . Hypertension   . Tobacco use     Patient Active Problem List   Diagnosis Date Noted  . Colon polyps 08/04/2015  . Dyspepsia   . Diarrhea 02/17/2015  . Drug-induced erectile dysfunction 04/25/2014  . PAD (peripheral artery disease) ([place]) 12/15/2013  . Chest pain 11/03/2013  . Unstable angina ([place]) 11/03/2013  . Tobacco use 11/03/2013  . Periumbilical pain 16/60/6301  . Essential hypertension, benign 01/20/2012  . Coronary atherosclerosis of native coronary artery 10/26/2011  . Mixed hyperlipidemia 10/20/2011    Past Surgical  History:  Procedure Laterality Date  . ABDOMINAL AORTAGRAM N/A 12/23/2013   Procedure: ABDOMINAL Maxcine Ham;  Surgeon: [name], MD;  Location: [facility] CATH LAB;  Service: Cardiovascular;  Laterality: N/A;  . CARDIAC CATHETERIZATION  10/2011   [facility]  . COLONOSCOPY N/A 03/16/2015   SLF: 1. No source for abdominal pain identified. 2. six colorectal polyps removed. 3. the colon was redundant 4. mild diverticulosis noted in the sigmoid colon 5. small internal hemorroids.   . ESOPHAGOGASTRODUODENOSCOPY N/A 03/16/2015   SLF: 1. No source for abdominal pain identified. 2. mild non-erosive gastritis and duodentitis.   . INSERTION OF MESH N/A 04/13/2015   Procedure: INSERTION OF MESH;  Surgeon: [name], MD;  Location: [place];  Service: General;  Laterality: N/A;  . LEFT HEART CATHETERIZATION WITH CORONARY ANGIOGRAM N/A 10/19/2011   Procedure: LEFT HEART CATHETERIZATION WITH CORONARY ANGIOGRAM;  Surgeon: [name], MD;  Location: [facility] CATH LAB;  Service: Cardiovascular;  Laterality: N/A;  . MOUTH SURGERY    . UMBILICAL HERNIA REPAIR N/A 04/13/2015   Procedure:  UMBILICAL HERNIA REPAIR;  Surgeon: [name], MD;  Location: [place];  Service: General;  Laterality: N/A;       Home Medications    Prior to Admission medications   Medication Sig Start Date End Date Taking? Authorizing Provider  albuterol (PROVENTIL HFA;VENTOLIN HFA) 108 (90 BASE) MCG/ACT inhaler Inhale 2 puffs into the lungs every 6 (six) hours as needed for wheezing or shortness of breath.  01/21/12  Yes Kathie Dike, MD  amLODipine (NORVASC) 5 MG tablet Take 1 tablet (5 mg total) by mouth daily. 11/02/14  Yes [name], MD  buPROPion (WELLBUTRIN XL) 150 MG 24 hr tablet TK 1 T PO D 07/22/15  Yes [provider]  EPIPEN 2-PAK 0.3 MG/0.3ML SOAJ injection Inject 0.3 mg as directed daily as needed. Allergic reaction 10/08/14  Yes [provider]  nitroGLYCERIN  (NITRODUR - DOSED IN MG/24 HR) 0.2 mg/hr patch Place 0.2 mg onto the skin daily.   Yes [provider]  nitroGLYCERIN (NITROSTAT) 0.4 MG SL tablet Place 0.4 mg under the tongue every 5 (five) minutes as needed for chest pain (3 doses MAX).   Yes [provider]  ondansetron (ZOFRAN ODT) 4 MG disintegrating tablet 26m ODT q4 hours prn nausea/vomit 02/21/15  Yes ZElnora Morrison MD  pantoprazole (PROTONIX) 40 MG tablet Take 1 tablet (40 mg total) by mouth daily. 11/04/13  Yes MKathie Dike MD  rosuvastatin (CRESTOR) 20 MG tablet TK 1 T PO D 07/22/15  Yes [provider]  traMADol (ULTRAM) 50 MG tablet TAKE 1 TABLET BY MOUTH EVERY 6 HOURS AS NEEDED FOR PAIN. 02/15/15  Yes [provider]  Na Sulfate-K Sulfate-Mg Sulf (SUPREP BOWEL PREP) SOLN Take 1 kit by mouth as directed. Patient not taking: Reported on 08/04/2015 03/02/15   FDanie Binder MD  oxyCODONE-acetaminophen (ROXICET) 5-325 MG tablet Take 1 tablet by mouth every 4 (four) hours as needed. 04/13/15   CErroll Luna MD    Family History Family History  Problem Relation Age of Onset  . Diabetes Mother   . Diabetes Father   . Stroke Father   . Colon cancer Maternal Uncle   . Heart disease Brother        Died of MI at 476   Social History Social History  Substance Use Topics  . Smoking status: Current Every Day Smoker    Packs/day: 0.50    Years: 40.00    Types: Cigarettes  . Smokeless tobacco: Never Used     Comment: smokes 3 ciggerettes daily for the past 2 months  . Alcohol use 0.0 oz/week     Comment: Occasional     Allergies   Bee venom   Review of Systems Review of Systems  Constitutional: Negative for fever.  Respiratory: Positive for shortness of breath. Negative for cough.   Cardiovascular: Positive for chest pain. Negative for leg swelling.  Gastrointestinal: Negative for abdominal pain, nausea and vomiting.  Neurological: Positive for headaches.  All other systems reviewed and  are negative.    Physical Exam Updated Vital Signs BP (!) 141/89 (BP Location: Left Arm)   Pulse 99   Temp 98.2 F (36.8 C) (Oral)   Resp 18   Ht 6' (1.829 m)   Wt 83.9 kg (185 lb)   SpO2 99%   BMI 25.09 kg/m   Physical Exam  Constitutional: He is oriented to person, place, and time. He appears well-developed and well-nourished. No distress.  HENT:  Head: Normocephalic and atraumatic.  Cardiovascular: Normal rate, regular rhythm and normal heart sounds.   No murmur heard. Pulmonary/Chest: Effort normal and breath sounds normal. No respiratory distress. He has no wheezes.  Abdominal: Soft. Bowel sounds are normal. There is no tenderness. There is no rebound.  Musculoskeletal: He exhibits no edema.  Neurological: He is alert and oriented to person, place, and time.  Skin: Skin is warm and dry.  Psychiatric: He has a  normal mood and affect.  Nursing note and vitals reviewed.    ED Treatments / Results  Labs (all labs ordered are listed, but only abnormal results are displayed) Labs Reviewed  BASIC METABOLIC PANEL - Abnormal; Notable for the following:       Result Value   Glucose, Bld 116 (*)    All other components within normal limits  CBC - Abnormal; Notable for the following:    HCT 36.4 (*)    MCHC 37.1 (*)    All other components within normal limits  I-STAT TROPONIN, ED    EKG  EKG Interpretation  Date/Time:  Wednesday September 05 2016 05:47:26 EDT Ventricular Rate:  93 PR Interval:    QRS Duration: 85 QT Interval:  363 QTC Calculation: 452 R Axis:   29 Text Interpretation:  Sinus rhythm Left atrial enlargement ST elevation, consider inferior injury Confirmed by Thayer Jew [phone]) on 09/05/2016 5:55:00 AM       Radiology Dg Chest 2 View  Result Date: 09/05/2016 CLINICAL DATA:  Chest pain. EXAM: CHEST  2 VIEW COMPARISON:  09/22/ 2015. FINDINGS: Mediastinum and hilar structures normal. Heart size normal. No focal infiltrate. Pulmonary interstitial  edema noted on prior chest x-ray of 11/03/2013 has cleared. No pleural effusion or pneumothorax. IMPRESSION: No acute cardiopulmonary disease noted. Electronically Signed   By: [name]  Register   On: 09/05/2016 06:11    Procedures Procedures (including critical care time)  Medications Ordered in ED Medications  nitroGLYCERIN (NITROGLYN) 2 % ointment 1 inch (1 inch Topical Given 09/05/16 0602)  aspirin tablet 325 mg (325 mg Oral Given 09/05/16 0601)  morphine 4 MG/ML injection 4 mg (4 mg Intravenous Given 09/05/16 0646)     Initial Impression / Assessment and Plan / ED Course  I have reviewed the triage vital signs and the nursing notes.  Pertinent labs & imaging results that were available during my care of the patient were reviewed by me and considered in my medical decision making (see chart for details).     Patient presents for chest pain. Ongoing for less than 1 hour. EKG shows no evidence of acute ischemia. Vital signs notable for mild hypertension. Patient reports pain is similar to when he had a heart attack. He was given aspirin and nitroglycerin. Initial troponin is 0.02.  Chest x-ray reassuring. On recheck, his pain is improved to 6 out of 10 with nitroglycerin. He was additionally given morphine for her persistent headache and pain. Given his history, feel he needs a formal chest pain rule out and may need an ischemic workup. Discussed with the hospitalist who will admit.  Final Clinical Impressions(s) / ED Diagnoses   Final diagnoses:  Precordial pain    New Prescriptions New Prescriptions   No medications on file     Merryl Hacker, MD 09/05/16 [phone]

## 2016-09-05 NOTE — H&P (Signed)
History and Physical    Joshua Hall OHF:[id number] DOB: [birth year] DOA: 09/05/2016  PCP: Celene Squibb, MD  Patient coming from: home  I have personally briefly reviewed patient's old medical records in [place]  Chief Complaint: Chest pain  HPI: Joshua Hall is a 61 y.o. male with medical history significant of past medical history of coronary artery sees, which is being treated medically with a cath in 2013 it's demonstrated nonobstructive disease to the LAD, stress test in 2015 was low risk, hypertension that comes into the hospital as he woke up this morning with chest pain that is not made worse by exertion or better with rest. Accompanying this he explains a bit of shortness of breath and headaches.No other associated symptoms like sweating nausea or vomiting.  ED Course:  In the ED vitals are stable first set of cardiac biomarkers is negative, EKG showed normal axis left atrial enlargement no T-wave abnormalities.  Review of Systems: As per HPI otherwise 10 point review of systems negative.    Past Medical History:  Diagnosis Date  . Colitis   . COPD (chronic obstructive pulmonary disease) ([place])   . Coronary atherosclerosis of native coronary artery    Largely nonobstructive in major epicardial was with 99% small diagonal stenosis - medical therapy  . Hyperlipidemia   . Hypertension   . Tobacco use     Past Surgical History:  Procedure Laterality Date  . ABDOMINAL AORTAGRAM N/A 12/23/2013   Procedure: ABDOMINAL Maxcine Ham;  Surgeon: [name], MD;  Location: [facility] CATH LAB;  Service: Cardiovascular;  Laterality: N/A;  . CARDIAC CATHETERIZATION  10/2011   [place]  . COLONOSCOPY N/A 03/16/2015   SLF: 1. No source for abdominal pain identified. 2. six colorectal polyps removed. 3. the colon was redundant 4. mild diverticulosis noted in the sigmoid colon 5. small internal hemorroids.   . ESOPHAGOGASTRODUODENOSCOPY N/A 03/16/2015   SLF: 1. No source  for abdominal pain identified. 2. mild non-erosive gastritis and duodentitis.   . INSERTION OF MESH N/A 04/13/2015   Procedure: INSERTION OF MESH;  Surgeon: [name], MD;  Location: [facility];  Service: General;  Laterality: N/A;  . LEFT HEART CATHETERIZATION WITH CORONARY ANGIOGRAM N/A 10/19/2011   Procedure: LEFT HEART CATHETERIZATION WITH CORONARY ANGIOGRAM;  Surgeon: [name], MD;  [facility] LAB;  Service: Cardiovascular;  Laterality: N/A;  . MOUTH SURGERY    . UMBILICAL HERNIA REPAIR N/A 04/13/2015   Procedure:  UMBILICAL HERNIA REPAIR;  Surgeon: [name], MD;  Location: [facility];  Service: General;  Laterality: N/A;     reports that he has been smoking Cigarettes.  He has a 20.00 pack-year smoking history. He has never used smokeless tobacco. He reports that he drinks alcohol. He reports that he does not use drugs.  Allergies  Allergen Reactions  . Bee Venom Swelling    Family History  Problem Relation Age of Onset  . Diabetes Mother   . Diabetes Father   . Stroke Father   . Colon cancer Maternal Uncle   . Heart disease Brother        Died of MI at 37     Prior to Admission medications   Medication Sig Start Date End Date Taking? Authorizing Provider  albuterol (PROVENTIL HFA;VENTOLIN HFA) 108 (90 BASE) MCG/ACT inhaler Inhale 2 puffs into the lungs every 6 (six) hours as needed for wheezing or shortness of breath. 01/21/12  Yes Memon,  Jolaine Artist, MD  amLODipine (NORVASC) 5 MG tablet Take 1 tablet (5 mg total) by mouth daily. 11/02/14  Yes [name], MD  buPROPion (WELLBUTRIN XL) 150 MG 24 hr tablet TK 1 T PO D 07/22/15  Yes [provider]  EPIPEN 2-PAK 0.3 MG/0.3ML SOAJ injection Inject 0.3 mg as directed daily as needed. Allergic reaction 10/08/14  Yes [provider]  nitroGLYCERIN (NITRODUR - DOSED IN MG/24 HR) 0.2 mg/hr patch Place 0.2 mg onto the skin daily.   Yes [provider]    nitroGLYCERIN (NITROSTAT) 0.4 MG SL tablet Place 0.4 mg under the tongue every 5 (five) minutes as needed for chest pain (3 doses MAX).   Yes [provider]  ondansetron (ZOFRAN ODT) 4 MG disintegrating tablet 90m ODT q4 hours prn nausea/vomit 02/21/15  Yes ZElnora Morrison MD  pantoprazole (PROTONIX) 40 MG tablet Take 1 tablet (40 mg total) by mouth daily. 11/04/13  Yes MKathie Dike MD  rosuvastatin (CRESTOR) 20 MG tablet TK 1 T PO D 07/22/15  Yes [provider]  traMADol (ULTRAM) 50 MG tablet TAKE 1 TABLET BY MOUTH EVERY 6 HOURS AS NEEDED FOR PAIN. 02/15/15  Yes [provider]  Na Sulfate-K Sulfate-Mg Sulf (SUPREP BOWEL PREP) SOLN Take 1 kit by mouth as directed. Patient not taking: Reported on 08/04/2015 03/02/15   FDanie Binder MD  oxyCODONE-acetaminophen (ROXICET) 5-325 MG tablet Take 1 tablet by mouth every 4 (four) hours as needed. 04/13/15   CErroll Luna MD    Physical Exam: Vitals:   09/05/16 0533 09/05/16 0600 09/05/16 0630 09/05/16 0634  BP: (!) 158/94 (!) 148/95 (!) 141/89 (!) 141/89  Pulse: 100 91 85 99  Resp: (!) 22   18  Temp: 98.2 F (36.8 C)     TempSrc: Oral     SpO2: 99% 98% 98% 99%  Weight:      Height:        Constitutional: NAD, calm, comfortable Vitals:   09/05/16 0533 09/05/16 0600 09/05/16 0630 09/05/16 0634  BP: (!) 158/94 (!) 148/95 (!) 141/89 (!) 141/89  Pulse: 100 91 85 99  Resp: (!) 22   18  Temp: 98.2 F (36.8 C)     TempSrc: Oral     SpO2: 99% 98% 98% 99%  Weight:      Height:       Eyes: Pupils equally round and reactive to light. ENMT: Poor dentition moist mucous membrane. Neck: Neck is supple no thyromegaly. Respiratory: Good air movement clear to auscultation. Cardiovascular: Regular rate and rhythm with positive S1-S2 no murmurs. Abdomen: Soft nontender nondistended. Musculoskeletal: No clubbing cyanosis or edema. Skin: No rashes or ulceration. Neurologic: He is awake alert and oriented 3  nonfocal. Psychiatric: Judgment and insight appeared normal.    Labs on Admission: I have personally reviewed following labs and imaging studies  CBC:  Recent Labs Lab 09/05/16 0532  WBC 7.1  HGB 13.5  HCT 36.4*  MCV 85.2  PLT 2157  Basic Metabolic Panel:  Recent Labs Lab 09/05/16 0532  NA 137  K 3.6  CL 102  CO2 27  GLUCOSE 116*  BUN 16  CREATININE 1.02  CALCIUM 9.3   GFR: Estimated Creatinine Clearance: 85.6 mL/min (by C-G formula based on SCr of 1.02 mg/dL). Liver Function Tests: No results for input(s): AST, ALT, ALKPHOS, BILITOT, PROT, ALBUMIN in the last 168 hours. No results for input(s): LIPASE, AMYLASE in the last 168 hours. No results for input(s): AMMONIA in the  last 168 hours. Coagulation Profile: No results for input(s): INR, PROTIME in the last 168 hours. Cardiac Enzymes: No results for input(s): CKTOTAL, CKMB, CKMBINDEX, TROPONINI in the last 168 hours. BNP (last 3 results) No results for input(s): PROBNP in the last 8760 hours. HbA1C: No results for input(s): HGBA1C in the last 72 hours. CBG: No results for input(s): GLUCAP in the last 168 hours. Lipid Profile: No results for input(s): CHOL, HDL, LDLCALC, TRIG, CHOLHDL, LDLDIRECT in the last 72 hours. Thyroid Function Tests: No results for input(s): TSH, T4TOTAL, FREET4, T3FREE, THYROIDAB in the last 72 hours. Anemia Panel: No results for input(s): VITAMINB12, FOLATE, FERRITIN, TIBC, IRON, RETICCTPCT in the last 72 hours. Urine analysis:    Component Value Date/Time   COLORURINE YELLOW 02/21/2015 0845   APPEARANCEUR CLEAR 02/21/2015 0845   LABSPEC 1.010 02/21/2015 0845   PHURINE 6.0 02/21/2015 0845   GLUCOSEU NEGATIVE 02/21/2015 0845   HGBUR MODERATE (A) 02/21/2015 0845   BILIRUBINUR NEGATIVE 02/21/2015 0845   KETONESUR NEGATIVE 02/21/2015 0845   PROTEINUR NEGATIVE 02/21/2015 0845   UROBILINOGEN 0.2 12/16/2013 2148   NITRITE NEGATIVE 02/21/2015 0845   LEUKOCYTESUR NEGATIVE 02/21/2015  0845    Radiological Exams on Admission: Dg Chest 2 View  Result Date: 09/05/2016 CLINICAL DATA:  Chest pain. EXAM: CHEST  2 VIEW COMPARISON:  09/22/ 2015. FINDINGS: Mediastinum and hilar structures normal. Heart size normal. No focal infiltrate. Pulmonary interstitial edema noted on prior chest x-ray of 11/03/2013 has cleared. No pleural effusion or pneumothorax. IMPRESSION: No acute cardiopulmonary disease noted. Electronically Signed   By: [name]  Register   On: 09/05/2016 06:11    EKG: Independently reviewed. Twelve-lead EKG showed normal sinus rhythm no T wave abnormalities left atrial enlargement.  Assessment/Plan Active Problems:   Mixed hyperlipidemia   Essential hypertension, benign   Chest pain  His heart score 4. He relates no shortness of breath and pain that was relieved with nitroglycerin patch. Not made worst or better with exertion. Plan on a beta blocker aspirin at home we'll go ahead and start these. [facility] consult cardiology for chest pain rule out. Continue statins.  DVT prophylaxis: lovenox  Code Status: full Family Communication: none Consults called: cardiology Admission status: observation   Charlynne Cousins MD [facility] Pager [phone]  If 7PM-7AM, please contact night-coverage [email or web address] Password [facility]  09/05/2016, 7:27 AM

## 2016-09-05 NOTE — Progress Notes (Signed)
**Note De-identified Areya Lemmerman Obfuscation** EKG complete, RN notified and placed in patient chart 

## 2016-09-05 NOTE — ED Triage Notes (Signed)
Pt c/o chest pain that started 20-30 minutes ago that shoot across his chest and c/o headache.

## 2016-09-05 NOTE — Plan of Care (Signed)
Problem: Safety: Goal: Ability to remain free from injury will improve Outcome: Progressing Patient advised of need to use call bell for assistance out of bed since he is hooked to our monitor.

## 2016-09-05 NOTE — Consult Note (Signed)
Cardiology Consult    Patient ID: AAYANSH CODISPOTI MRN: [id number], DOB/AGE: Oct 08, 1955   Admit date: 09/05/2016 Date of Consult: 09/05/2016  Primary Physician: Celene Squibb, MD Reason for Consult: Chest Pain Primary Cardiologist: Dr. Fletcher Anon Requesting Provider: Dr. Olevia Bowens  Patient Profile    Joshua Hall is a 61 y.o. male w/ PMH of CAD (cath in 2013 showing nonobstructive disease along the LAD and RCA with 99% stenosis of D2 --> too small for intervention, medical therapy recommended, normal NST in 10/2013), PAD (s/p stent placement to the left SFA in 2015), HTN, HLD, and continued tobacco use who presented to [facility] ED on 09/05/2016 for evaluation of chest pain.   History of Present Illness    He reports developing acute 9/10 tightness along his chest while driving to work this morning. Notes associated dyspnea, diaphoresis, and nausea. No radiating pain down his arms or to his jaw. His pain was present for over 30 minutes and was not improving, therefore he came to the ED for further evaluation. He is pain-free at this time. NTG patch is in place.   Over the past few weeks, he has been in his usual state of health. No recent illnesses. No orthopnea, PND, or lower extremity edema. He does report claudication symptoms along his left leg which have intensified over the past several months. Denies any chest pain when climbing stairs at his home or walking around the grocery store. Does have occasional dyspnea on exertion but no acute worsening of his symptoms.   Initial labs show WBC of 7.1, Hgb 13.5, platelets 243. Na+ 137, K+ 3.6, creatinine 1.02. Initial troponin negative. CXR shows no acute cardiopulmonary disease. EKG with NSR, HR 93, with nonspecific ST changes along V2-V3.   Past Medical History   Past Medical History:  Diagnosis Date  . Colitis   . COPD (chronic obstructive pulmonary disease) ([place])   . Coronary atherosclerosis of native coronary artery    a. cath in 2013  showing nonobstructive disease along the LAD and RCA with 99% stenosis of D2 --> too small for intervention, medical therapy recommended b. normal NST in 10/2013  . Hyperlipidemia   . Hypertension   . PAD (peripheral artery disease) ([place])    a. s/p stent placement to the left SFA in 2015  . Tobacco use     Past Surgical History:  Procedure Laterality Date  . ABDOMINAL AORTAGRAM N/A 12/23/2013   Procedure: ABDOMINAL Maxcine Ham;  Surgeon: [name], MD;  Location: [place] CATH LAB;  Service: Cardiovascular;  Laterality: N/A;  . CARDIAC CATHETERIZATION  10/2011   [place]  . COLONOSCOPY N/A 03/16/2015   SLF: 1. No source for abdominal pain identified. 2. six colorectal polyps removed. 3. the colon was redundant 4. mild diverticulosis noted in the sigmoid colon 5. small internal hemorroids.   . ESOPHAGOGASTRODUODENOSCOPY N/A 03/16/2015   SLF: 1. No source for abdominal pain identified. 2. mild non-erosive gastritis and duodentitis.   . INSERTION OF MESH N/A 04/13/2015   Procedure: INSERTION OF MESH;  Surgeon: [name], MD;  Location: [place];  Service: General;  Laterality: N/A;  . LEFT HEART CATHETERIZATION WITH CORONARY ANGIOGRAM N/A 10/19/2011   Procedure: LEFT HEART CATHETERIZATION WITH CORONARY ANGIOGRAM;  Surgeon: [name], MD;  Location: [facility] CATH LAB;  Service: Cardiovascular;  Laterality: N/A;  . MOUTH SURGERY    . UMBILICAL HERNIA REPAIR N/A 04/13/2015   Procedure:  UMBILICAL HERNIA REPAIR;  Surgeon:  [name], MD;  Location: [place];  Service: General;  Laterality: N/A;     Allergies  Allergies  Allergen Reactions  . Bee Venom Swelling    Inpatient Medications    . amLODipine  5 mg Oral Daily  . [START ON 09/06/2016] aspirin EC  81 mg Oral Daily  . buPROPion  150 mg Oral Daily  . heparin  5,000 Units Subcutaneous Q8H  . metoprolol tartrate  12.5 mg Oral BID  . pantoprazole  40 mg Oral Daily  . rosuvastatin  10 mg Oral  q1800    Family History    Family History  Problem Relation Age of Onset  . Diabetes Mother   . Diabetes Father   . Stroke Father   . Colon cancer Maternal Uncle   . Heart disease Brother        Died of MI at 61    Social History    Social History   Social History  . Marital status: Divorced    Spouse name: N/A  . Number of children: N/A  . Years of education: N/A   Occupational History  . Not on file.   Social History Main Topics  . Smoking status: Current Every Day Smoker    Packs/day: 0.50    Years: 40.00    Types: Cigarettes  . Smokeless tobacco: Never Used     Comment: smokes 3 ciggerettes daily for the past 2 months  . Alcohol use 0.0 oz/week     Comment: Occasional  . Drug use: No  . Sexual activity: Yes   Other Topics Concern  . Not on file   Social History Narrative  . No narrative on file     Review of Systems    General:  No chills, fever, night sweats or weight changes.  Cardiovascular:  No dyspnea on exertion, edema, orthopnea, palpitations, paroxysmal nocturnal dyspnea. Positive for chest pain and claudication.  Dermatological: No rash, lesions/masses Respiratory: No cough, dyspnea Urologic: No hematuria, dysuria Abdominal:   No nausea, vomiting, diarrhea, bright red blood per rectum, melena, or hematemesis Neurologic:  No visual changes, wkns, changes in mental status.  All other systems reviewed and are otherwise negative except as noted above.  Physical Exam    Blood pressure 129/85, pulse 67, temperature 98.1 F (36.7 C), temperature source Oral, resp. rate (!) 22, height 6' (1.829 m), weight 171 lb 11.8 oz (77.9 kg), SpO2 98 %.  General: Pleasant, African American male appearing in NAD Psych: Normal affect. Neuro: Alert and oriented X 3. Moves all extremities spontaneously. HEENT: Normal  Neck: Supple without bruits or JVD. Lungs:  Resp regular and unlabored, CTA without wheezing or rales. Heart: RRR no s3, s4, or  murmurs. Abdomen: Soft, non-tender, non-distended, BS + x 4.  Extremities: No clubbing, cyanosis or edema. DP 1+ and equal bilaterally.  Labs    Troponin [facility] of Care Test)  Recent Labs  09/05/16 0538  TROPIPOC 0.02   No results for input(s): CKTOTAL, CKMB, TROPONINI in the last 72 hours. Lab Results  Component Value Date   WBC 7.1 09/05/2016   HGB 13.5 09/05/2016   HCT 36.4 (L) 09/05/2016   MCV 85.2 09/05/2016   PLT 243 09/05/2016     Recent Labs Lab 09/05/16 0532 09/05/16 0938  NA 137  --   K 3.6  --   CL 102  --   CO2 27  --   BUN 16  --   CREATININE 1.02 0.89  CALCIUM 9.3  --   GLUCOSE 116*  --    Lab Results  Component Value Date   CHOL 207 (H) 11/03/2013   HDL 63 11/03/2013   LDLCALC 112 (H) 11/03/2013   TRIG 159 (H) 11/03/2013   Lab Results  Component Value Date   DDIMER <0.27 01/21/2012     Radiology Studies    Dg Chest 2 View  Result Date: 09/05/2016 CLINICAL DATA:  Chest pain. EXAM: CHEST  2 VIEW COMPARISON:  09/22/ 2015. FINDINGS: Mediastinum and hilar structures normal. Heart size normal. No focal infiltrate. Pulmonary interstitial edema noted on prior chest x-ray of 11/03/2013 has cleared. No pleural effusion or pneumothorax. IMPRESSION: No acute cardiopulmonary disease noted. Electronically Signed   By: [name]  Register   On: 09/05/2016 06:11    EKG & Cardiac Imaging    EKG:  NSR, HR 93, with nonspecific ST changes along V2-V3. - Personally Reviewed  Echocardiogram: Pending  Cardiac Catheterization: 10/19/2011 Procedure: Left Heart Cath, Selective Coronary Angiography, LV angiography  Indication: 61 year old black male presents with symptoms of unstable angina. He has a history of tobacco abuse.                                   Procedural Details: The right wrist was prepped, draped, and anesthetized with 1% lidocaine. Using the modified Seldinger technique, a 5 French sheath was introduced into the right radial artery. 3 mg of  verapamil was administered through the sheath, weight-based unfractionated heparin was administered intravenously. Standard Judkins catheters were used for selective coronary angiography and left ventriculography. Catheter exchanges were performed over an exchange length guidewire. There were no immediate procedural complications. A TR band was used for radial hemostasis at the completion of the procedure.  The patient was transferred to the post catheterization recovery area for further monitoring.  Procedural Findings: Hemodynamics: AO 105/61 with a mean of 80 mmHg LV 106/14 mmHg  Coronary angiography: Coronary dominance: right  Left mainstem: Normal.  Left anterior descending (LAD): There is 30% stenosis in the proximal and mid LAD. The second diagonal is a very small Aairah Negrette. It is only 1 mm in diameter. It has a 99% stenosis proximally.  There is a large ramus intermediate Shizuko Wojdyla which is normal.  Left circumflex (LCx): Normal.  Right coronary artery (RCA): Mild nonobstructive disease in the mid and distal vessel up to 10-20%.  Left ventriculography: Left ventricular systolic function is normal, LVEF is estimated at 55-65%, there is no significant mitral regurgitation   Final Conclusions:   1. Single vessel obstructive coronary disease involving a very tiny diagonal Kaityln Kallstrom. Otherwise nonobstructive disease. The diagonal Talibah Colasurdo is too small for intervention.  2. Normal left ventricular function.  Recommendations: Aggressive medical therapy and risk factor modification. Anticipate discharge in the morning if no further chest pain.    Assessment & Plan    1. Chest Pain with Mixed Typical and Atypical Features - the patient presents for evaluation of chest pain which started while driving to work. Describes this as a tightness with associated dyspnea, diaphoresis, and nausea. Pain improved with SL NTG. Pain has now resolved but he is tender to palpation along his left  precordium. No recent exertional chest pain.  - he has a known history of CAD as detailed above. Has multiple risk factors including PAD, HTN, HLD, and continued tobacco use. - Initial troponin negative. EKG with NSR, HR 93, with nonspecific ST changes  along V2-V3.  - will continue to cycle cardiac enzymes. Plan for echocardiogram to assess LV function and wall motion. If enzymes remain negative, would recommend noninvasive evaluation as an inpatient vs. outpatient. If enzymes trend upwards, he would need definitve evaluation with a cardiac catheterization in the setting of his known disease.   2. CAD - cath in 2013 showed 30% mid LAD stenosis, 20% RCA stenosis, and 99% D2 stenosis with medical management recommended as the diagonal Sully Manzi was too small for intervention. Low-risk NST in 2015. - continue ASA, statin, and BB therapy.   3. PAD - s/p stent placement to the left SFA in 2015. - does report recurrent claudication symptoms. Will need outpatient follow-up with Dr. Fletcher Anon for further evaluation of this.  - continue ASA and statin therapy.  4. HTN - BP at 129/85 on most recent check. - continue PTA Amlodipine 5mg  daily. Has been started on Lopressor 12.5mg  BID.   5. HLD - currently on Crestor 20mg  daily.  - recheck FLP. LDL goal < 70 with known CAD.  6. Tobacco Use - still smoking 5-6 cigarettes per day.  - complete cessation advised.   Signed, Erma Heritage, PA-C 09/05/2016, 10:30 AM Pager: [phone]  Patient seen and discussed with PA Ahmed Prima, I agree with her documentation above. 61 yo male history of of CAD with cath in 2013 showing nonobstructive disease other than a small 99% D2 too small for intervention, PAD, HTN, HL.   K 3.6, Cr 1.02, Hgb 13.5, Plt 243, Trop neg x 1 CXR no acute process EKG SR, no ischemic changes  Patient with known CAD presents with chest pain. He is just in the beginning of his evaluation and has not ruled out. Will cycle enzymes and  EKG today, obtain echo to further evaluate and risk stratify. Pending on initial workup consider noninvasive vs invasive ischemic evaluation tomorrow. Please make NPO tonight. Current medical therapy with lopressor 12.5mg  bid, crestor, ASA 81mg . Given his CAD and PAD should be on ACE-I for additional CV benefits. Prior cardiology notes mention erectile dysfunction on lopressor. If no additional evidence for ACS would d/c lopressor and start ACE-I instead. Has had some recent claudication pain, we will need to have him reestablish with Dr Fletcher Anon at discharge.    Carlyle Dolly MD

## 2016-09-05 NOTE — Progress Notes (Signed)
*  PRELIMINARY RESULTS* Echocardiogram 2D Echocardiogram has been performed.  Samuel Germany 09/05/2016, 3:53 PM

## 2016-09-06 ENCOUNTER — Encounter (HOSPITAL_COMMUNITY): Payer: Self-pay

## 2016-09-06 ENCOUNTER — Observation Stay (HOSPITAL_BASED_OUTPATIENT_CLINIC_OR_DEPARTMENT_OTHER): Payer: PRIVATE HEALTH INSURANCE

## 2016-09-06 DIAGNOSIS — R079 Chest pain, unspecified: Secondary | ICD-10-CM

## 2016-09-06 DIAGNOSIS — I739 Peripheral vascular disease, unspecified: Secondary | ICD-10-CM | POA: Diagnosis not present

## 2016-09-06 DIAGNOSIS — Z720 Tobacco use: Secondary | ICD-10-CM

## 2016-09-06 DIAGNOSIS — R251 Tremor, unspecified: Secondary | ICD-10-CM | POA: Diagnosis not present

## 2016-09-06 DIAGNOSIS — E782 Mixed hyperlipidemia: Secondary | ICD-10-CM | POA: Diagnosis not present

## 2016-09-06 DIAGNOSIS — I25119 Atherosclerotic heart disease of native coronary artery with unspecified angina pectoris: Secondary | ICD-10-CM

## 2016-09-06 DIAGNOSIS — I10 Essential (primary) hypertension: Secondary | ICD-10-CM | POA: Diagnosis not present

## 2016-09-06 DIAGNOSIS — R072 Precordial pain: Secondary | ICD-10-CM | POA: Diagnosis not present

## 2016-09-06 LAB — NM MYOCAR MULTI W/SPECT W/WALL MOTION / EF
CHL CUP NUCLEAR SDS: 0
CHL CUP NUCLEAR SRS: 0
CHL CUP RESTING HR STRESS: 54 {beats}/min
LV dias vol: 161 mL (ref 62–150)
LV sys vol: 78 mL
NUC STRESS TID: 1.09
Peak HR: 85 {beats}/min
RATE: 0.34
SSS: 0

## 2016-09-06 LAB — LIPID PANEL
CHOL/HDL RATIO: 3.2 ratio
Cholesterol: 154 mg/dL (ref 0–200)
HDL: 48 mg/dL (ref >40)
LDL CALC: 74 mg/dL (ref 0–99)
Triglycerides: 160 mg/dL — ABNORMAL HIGH (ref <150)
VLDL: 32 mg/dL (ref 0–40)

## 2016-09-06 LAB — HIV ANTIBODY (ROUTINE TESTING W REFLEX): HIV Screen 4th Generation wRfx: NONREACTIVE

## 2016-09-06 MED ORDER — POTASSIUM CHLORIDE CRYS ER 20 MEQ PO TBCR
20.0000 meq | EXTENDED_RELEASE_TABLET | Freq: Once | ORAL | Status: AC
  Administered 2016-09-06: 20 meq via ORAL
  Filled 2016-09-06: qty 2

## 2016-09-06 MED ORDER — TECHNETIUM TC 99M TETROFOSMIN IV KIT
10.0000 | PACK | Freq: Once | INTRAVENOUS | Status: AC | PRN
  Administered 2016-09-06: 10 via INTRAVENOUS

## 2016-09-06 MED ORDER — TECHNETIUM TC 99M TETROFOSMIN IV KIT
30.0000 | PACK | Freq: Once | INTRAVENOUS | Status: AC | PRN
  Administered 2016-09-06: 30 via INTRAVENOUS

## 2016-09-06 MED ORDER — LOSARTAN POTASSIUM 50 MG PO TABS
25.0000 mg | ORAL_TABLET | Freq: Every day | ORAL | Status: DC
  Administered 2016-09-06: 25 mg via ORAL
  Filled 2016-09-06: qty 1

## 2016-09-06 MED ORDER — LOSARTAN POTASSIUM 25 MG PO TABS: 25.0000 mg | ORAL_TABLET | Freq: Every day | ORAL | 6 refills | Status: DC

## 2016-09-06 MED ORDER — REGADENOSON 0.4 MG/5ML IV SOLN
INTRAVENOUS | Status: AC
  Administered 2016-09-06: 0.4 mg via INTRAVENOUS
  Filled 2016-09-06: qty 5

## 2016-09-06 MED ORDER — ASPIRIN 81 MG PO TBEC: 81.0000 mg | DELAYED_RELEASE_TABLET | Freq: Every day | ORAL | Status: DC

## 2016-09-06 MED ORDER — SODIUM CHLORIDE 0.9% FLUSH
INTRAVENOUS | Status: AC
  Administered 2016-09-06: 10 mL via INTRAVENOUS
  Filled 2016-09-06: qty 10

## 2016-09-06 NOTE — Progress Notes (Signed)
Progress Note  Patient Name: Joshua Hall Date of Encounter: 09/06/2016  Primary Cardiologist: Dr. Kathlyn Sacramento  Subjective   No chest pain since this morning. Continues to have some intermittent leg pain.   Inpatient Medications    Scheduled Meds: . amLODipine  5 mg Oral Daily  . aspirin EC  81 mg Oral Daily  . buPROPion  150 mg Oral Daily  . heparin  5,000 Units Subcutaneous Q8H  . metoprolol tartrate  12.5 mg Oral BID  . pantoprazole  40 mg Oral Daily  . rosuvastatin  10 mg Oral q1800    PRN Meds: acetaminophen, albuterol, gi cocktail, nitroGLYCERIN, ondansetron (ZOFRAN) IV, ondansetron   Vital Signs    Vitals:   09/05/16 2117 09/06/16 0400 09/06/16 0500 09/06/16 0713  BP: 118/74     Pulse: 60   (!) 52  Resp:    13  Temp:  97.8 F (36.6 C)  98.6 F (37 C)  TempSrc:  Oral  Oral  SpO2:    95%  Weight:   171 lb 11.8 oz (77.9 kg)   Height:        Intake/Output Summary (Last 24 hours) at 09/06/16 0747 Last data filed at 09/05/16 2300  Gross per 24 hour  Intake              240 ml  Output                0 ml  Net              240 ml   Filed Weights   09/05/16 0528 09/05/16 0818 09/06/16 0500  Weight: 185 lb (83.9 kg) 171 lb 11.8 oz (77.9 kg) 171 lb 11.8 oz (77.9 kg)    Telemetry    Sinus bradycardia rates in the 40's and 50's. Personally Reviewed.  ECG    Tracing from 09/05/2016 showed sinus tachycardia with nonspecific ST changes. Personally Reviewed.  Physical Exam   GEN: No acute distress.   Neck: No JVD Cardiac: RRR, no murmurs, rubs, or gallops.  Respiratory: Clear to auscultation bilaterally. GI: Soft, nontender, non-distended  MS: No edema; No deformity. Neuro:  Nonfocal  Psych: Normal affect   Labs    Chemistry Recent Labs Lab 09/05/16 0532 09/05/16 0938  NA 137  --   K 3.6  --   CL 102  --   CO2 27  --   GLUCOSE 116*  --   BUN 16  --   CREATININE 1.02 0.89  CALCIUM 9.3  --   GFRNONAA >60 >60  GFRAA >60 >60    ANIONGAP 8  --      Hematology Recent Labs Lab 09/05/16 0532 09/05/16 0938  WBC 7.1 6.8  RBC 4.27 3.97*  HGB 13.5 12.6*  HCT 36.4* 33.8*  MCV 85.2 85.1  MCH 31.6 31.7  MCHC 37.1* 37.3*  RDW 13.7 13.6  PLT 243 223    Cardiac Enzymes Recent Labs Lab 09/05/16 0938 09/05/16 1608 09/05/16 2203  TROPONINI 0.03* 0.03* 0.03*    Recent Labs Lab 09/05/16 0538  TROPIPOC 0.02     Radiology    Dg Chest 2 View  Result Date: 09/05/2016 CLINICAL DATA:  Chest pain. EXAM: CHEST  2 VIEW COMPARISON:  09/22/ 2015. FINDINGS: Mediastinum and hilar structures normal. Heart size normal. No focal infiltrate. Pulmonary interstitial edema noted on prior chest x-ray of 11/03/2013 has cleared. No pleural effusion or pneumothorax. IMPRESSION: No acute cardiopulmonary disease noted. Electronically Signed  ByMarcello Moores  Register   On: 09/05/2016 06:11    Cardiac Studies   Coronary angiography:10/19/2011 Coronary dominance: right Left mainstem: Normal. Left anterior descending (LAD): There is 30% stenosis in the proximal and mid LAD. The second diagonal is a very small branch. It is only 1 mm in diameter. It has a 99% stenosis proximally. There is a large ramus intermediate branch which is normal. Left circumflex (LCx): Normal. Right coronary artery (RCA): Mild nonobstructive disease in the mid and distal vessel up to 10-20%. Left ventriculography: Left ventricular systolic function is normal, LVEF is estimated at 55-65%, there is no significant mitral regurgitation   Final Conclusions:   1. Single vessel obstructive coronary disease involving a very tiny diagonal branch. Otherwise nonobstructive disease. The diagonal branch is too small for intervention. 2. Normal left ventricular function.  Echocardiogram 09/05/2016 Left ventricle: The cavity size was normal. Wall thickness was   increased in a pattern of mild LVH. Systolic function was normal.   The estimated ejection fraction was in the  range of 55% to 60%.   Wall motion was normal; there were no regional wall motion   abnormalities. Left ventricular diastolic function parameters   were normal. - Aortic valve: Valve area (VTI): 2.39 cm^2. Valve area (Vmax):   2.42 cm^2. Valve area (Vmean): 2.15 cm^2. - Left atrium: The atrium was mildly dilated. - Right atrium: The atrium was mildly dilated. - Atrial septum: No defect or patent foramen ovale was identified. - Pulmonary arteries: Systolic pressure was mildly increased. PA   peak pressure: 36 mm Hg (S). - Technically adequate study.  Patient Profile     61 y.o. male w/ PMH of CAD (cath in 2013 showing nonobstructive disease along the LAD and RCA with 99% stenosis of D2 --> too small for intervention, medical therapy recommended, normal NST in 10/2013), PAD (s/p stent placement to the left SFA in 2015), HTN, HLD, and continued tobacco use who presented to [facility] ED on 09/05/2016 for evaluation of chest pain. Troponin I levels equivocal in flat pattern at 0.03. ECG without acute ST segment changes.  Assessment & Plan    1. Chest discomfort: Patient history of nonobstructive CAD in the major epicardial vessels by cardiac catheterization in 2013, with 30% proximal and mid LAD, with very small second diagonal with 99% stenosis proximally, and RCA with 10-20% in the mid vessel. Troponin I levels are equivocal in flat pattern at 0.03. ECG without acute ST segment changes. LVEF normal by follow-up echocardiogram. Has been kept NPO. Will plan IP Lexiscan Myoview this am for evaluation for progression of CAD. Remove NTG paste.   2. PAD: Hx of stent placement to left SFA in 2015 with complaints of intermittent claudication. He needs to have follow up with Dr. Fletcher Anon when discharged.   3. Hypertension: BP is controlled, soft at rest with NTG and BB on board. Dr. Harl Bowie reported ED with BB therapy. If stress test  is negative will change to ACE, and continue amlodipine.   4. Ongoing  tobacco abuse: Still smokes 1/2 ppd.   5. Hypercholesterolemia: This am labs reveal LDL of 74, TC of 154. Continue rosuvastatin 10 mg daily.    If stress test is negative, should be able to return home with OP follow up with Dr. Fletcher Anon.   Signed, Phill Myron. West Pugh, ANP, AACC   09/06/2016, 7:47 AM     Attending note:  Patient seen and examined. Reviewed consultation note per Dr. Harl Bowie and modified above  note by Ms. West Pugh. Patient presents with recurrent chest pain at rest, no baseline exertional angina, and reportedly compliant with medical therapy. He has a history of nonobstructive CAD in the major epicardial back in 2013 with branch vessel disease involving a very small second diagonal that was managed medically. He is stable this morning without recurrent chest pain, troponin I levels are equivocal at 0.03 in flat pattern. ECG without acute ST segment changes.  On examination he appears comfortable. Heart rate in the 50s, some dips into the 40s by telemetry. Systolic blood pressure ranging from 100 to 140. Lungs are clear without labored breathing. Cardiac exam with RRR no gallop. Echocardiogram from yesterday revealed normal LVEF without wall motion abnormalities.  Patient presents with chest pain at rest, rule out unstable angina, although troponin I levels are equivocal and in a flat pattern not suggestive of ACS, ECG without acute ST segment changes. He is nothing by mouth for Lexiscan Myoview this morning. Unless significant ischemic burden noted, will likely manage medically and arrange outpatient follow-up with Dr. Fletcher Anon.  Satira Sark, [name], F.A.C.C.

## 2016-09-06 NOTE — Progress Notes (Addendum)
PROGRESS NOTE    Joshua Hall  ALP:[id number] DOB: 1956-01-12 DOA: 09/05/2016 PCP: Celene Squibb, MD  Primary cardiologist Dr. Fletcher Anon   Brief Narrative:  Patient is a 61 year old man with a history of CAD-cardiac catheterization in 2013 showed nonobstructive disease along the LAD and RCA with 99% stenosis of D2 which was too small for intervention and subsequently medical therapy was recommended, PAD-status post stent placement of the left SFA in 2005, HTN, and tobacco use, who presented to the ED on 09/05/16 for chest pain. In the ED, he was afebrile and hemodynamically stable. His lab data were virtually unremarkable; troponin I was 0.02. His chest x-ray revealed no acute cardiopulmonary disease. His EKG revealed normal sinus rhythm with nonspecific ST changes. He was admitted for further evaluation.   Assessment & Plan:   Principal Problem:   Substernal chest pain Active Problems:   Coronary atherosclerosis of native coronary artery   Essential hypertension, benign   Mixed hyperlipidemia   Tobacco use   PAD (peripheral artery disease) (HCC)   Tremor of right hand   1. Substernal chest pain in a patient with known CAD. -Patient was restarted on his chronic medications including aspirin, amlodipine, nitroglycerin patch, and an Crestor. PPI therapy was continued. -Low-dose metoprolol was started. -Cardiac enzymes were ordered. The Troponin I remained virtually flat ranging from 0.02-0.03. -Cardiology was consulted. They agreed with medical management, but noted that an ACE inhibitor could be added if there was no additional evidence of ACS and metoprolol could also be discontinued as it had caused erectile dysfunction in the past. -2-D echocardiogram ordered and revealed preserved LV function with no evidence of diastolic dysfunction. EF was 55-60%. -Cardiology ordered a Lexiscan Myoview; results pending. -His chest pain has subsided.  PAD. Patient has a history of stent placement  to the left SFA in 2015. He has had some intermittent symptoms of claudication. In-house cardiology recommends that the patient follow-up with his primary cardiologist Dr. Fletcher Anon when discharged.  Hypertension. The patient is treated chronically with amlodipine. His blood pressure has been in the 353G systolically. -Amlodipine was continued with the addition of metoprolol and ongoing topical nitroglycerin. -If the stress test is negative, cardiology recommends adding an ACE inhibitor and discontinuing the beta blocker.  Tobacco abuse. Patient was strongly advised to stop smoking indefinitely.  Hyperlipidemia. Patient is treated chronically with Crestor. It was continued. -Current fasting lipid panel revealed a total cholesterol of 154, LDL of 74, HDL of 48, and triglycerides of 160.  Mild right upper extremity tremor. Patient reports having a noticeable tremor that started several months ago. It is primarily in his right hand and occurs at rest and with activity. He denies family history of essential tremor or Parkinson's disease. He has no obvious parkinsonian features and no cranial nerve deficits. It does not appear to be severe per exam. -I advised the patient to follow-up with his PCP who could refer him to a neurologist for further evaluation.   DVT prophylaxis: Subcutaneous heparin Code Status: Full code Family Communication: Family not available Disposition Plan: Discharge to home when clinically appropriate   Consultants:   CARDIOLOGY  Procedures:  Echocardiogram 09/05/2016 Left ventricle: The cavity size was normal. Wall thickness was increased in a pattern of mild LVH. Systolic function was normal. The estimated ejection fraction was in the range of 55% to 60%. Wall motion was normal; there were no regional wall motion abnormalities. Left ventricular diastolic function parameters were normal. - Aortic valve: Valve area (VTI):  2.39 cm^2. Valve area  (Vmax): 2.42 cm^2. Valve area (Vmean): 2.15 cm^2. - Left atrium: The atrium was mildly dilated. - Right atrium: The atrium was mildly dilated. - Atrial septum: No defect or patent foramen ovale was identified. - Pulmonary arteries: Systolic pressure was mildly increased. PA peak pressure: 36 mm Hg (S). - Technically adequate study.  Antimicrobials:   None    Subjective: Patient reports transient substernal CP overnight without other symptoms. No complaints of chest pain or shortness of breath currently.  Objective: Vitals:   09/06/16 0400 09/06/16 0500 09/06/16 0713 09/06/16 0800  BP:    (!) 141/83  Pulse:   (!) 52 (!) 57  Resp:   13 (!) 21  Temp: 97.8 F (36.6 C)  98.6 F (37 C)   TempSrc: Oral  Oral   SpO2:   95% 97%  Weight:  77.9 kg (171 lb 11.8 oz)    Height:        Intake/Output Summary (Last 24 hours) at 09/06/16 1301 Last data filed at 09/05/16 2300  Gross per 24 hour  Intake              240 ml  Output                0 ml  Net              240 ml   Filed Weights   09/05/16 0528 09/05/16 0818 09/06/16 0500  Weight: 83.9 kg (185 lb) 77.9 kg (171 lb 11.8 oz) 77.9 kg (171 lb 11.8 oz)    Examination:  General exam: Appears calm and comfortable  Respiratory system: Clear to auscultation. Respiratory effort normal. Cardiovascular system: S1 & S2 heard with 6-2/9 systolic murmur. No pedal edema. Gastrointestinal system: Abdomen is nondistended, soft and nontender. No organomegaly or masses felt. Normal bowel sounds heard. Central nervous system: Alert and oriented. Mild right hand resting and intention tremor. CN 2-12 intact. Extremities: No acute hot red joints. Skin: No rashes, lesions or ulcers Psychiatry: Judgement and insight appear normal. Mood & affect appropriate.     Data Reviewed: I have personally reviewed following labs and imaging studies  CBC:  Recent Labs Lab 09/05/16 0532 09/05/16 0938  WBC 7.1 6.8  HGB 13.5 12.6*  HCT 36.4*  33.8*  MCV 85.2 85.1  PLT 243 476   Basic Metabolic Panel:  Recent Labs Lab 09/05/16 0532 09/05/16 0938  NA 137  --   K 3.6  --   CL 102  --   CO2 27  --   GLUCOSE 116*  --   BUN 16  --   CREATININE 1.02 0.89  CALCIUM 9.3  --    GFR: Estimated Creatinine Clearance: 98.1 mL/min (by C-G formula based on SCr of 0.89 mg/dL). Liver Function Tests: No results for input(s): AST, ALT, ALKPHOS, BILITOT, PROT, ALBUMIN in the last 168 hours. No results for input(s): LIPASE, AMYLASE in the last 168 hours. No results for input(s): AMMONIA in the last 168 hours. Coagulation Profile: No results for input(s): INR, PROTIME in the last 168 hours. Cardiac Enzymes:  Recent Labs Lab 09/05/16 0938 09/05/16 1608 09/05/16 2203  TROPONINI 0.03* 0.03* 0.03*   BNP (last 3 results) No results for input(s): PROBNP in the last 8760 hours. HbA1C: No results for input(s): HGBA1C in the last 72 hours. CBG: No results for input(s): GLUCAP in the last 168 hours. Lipid Profile:  Recent Labs  09/06/16 0525  CHOL 154  HDL 48  LDLCALC 74  TRIG 160*  CHOLHDL 3.2   Thyroid Function Tests: No results for input(s): TSH, T4TOTAL, FREET4, T3FREE, THYROIDAB in the last 72 hours. Anemia Panel: No results for input(s): VITAMINB12, FOLATE, FERRITIN, TIBC, IRON, RETICCTPCT in the last 72 hours. Sepsis Labs: No results for input(s): PROCALCITON, LATICACIDVEN in the last 168 hours.  Recent Results (from the past 240 hour(s))  MRSA PCR Screening     Status: Abnormal   Collection Time: 09/05/16  8:12 AM  Result Value Ref Range Status   MRSA by PCR POSITIVE (A) NEGATIVE Final    Comment:        The GeneXpert MRSA Assay (FDA approved for NASAL specimens only), is one component of a comprehensive MRSA colonization surveillance program. It is not intended to diagnose MRSA infection nor to guide or monitor treatment for MRSA infections. RESULT CALLED TO, READ BACK BY AND VERIFIED WITH: MOTLEY J. AT  1250 ON [id number] BY THOMPSON S.          Radiology Studies: Dg Chest 2 View  Result Date: 09/05/2016 CLINICAL DATA:  Chest pain. EXAM: CHEST  2 VIEW COMPARISON:  09/22/ 2015. FINDINGS: Mediastinum and hilar structures normal. Heart size normal. No focal infiltrate. Pulmonary interstitial edema noted on prior chest x-ray of 11/03/2013 has cleared. No pleural effusion or pneumothorax. IMPRESSION: No acute cardiopulmonary disease noted. Electronically Signed   By: [name]  Register   On: 09/05/2016 06:11        Scheduled Meds: . amLODipine  5 mg Oral Daily  . aspirin EC  81 mg Oral Daily  . buPROPion  150 mg Oral Daily  . heparin  5,000 Units Subcutaneous Q8H  . metoprolol tartrate  12.5 mg Oral BID  . pantoprazole  40 mg Oral Daily  . rosuvastatin  10 mg Oral q1800   Continuous Infusions:   LOS: 1 day    Time spent: [place], MD [facility] Pager [phone]  If 7PM-7AM, please contact night-coverage [email or web address] Password [email or web address] 09/06/2016, 1:01 PM

## 2016-09-06 NOTE — Progress Notes (Signed)
Discharge instructions and prescription given to patient. Patient will leave via girlfriend. Work note given to patient. Patient has all belongings.  Margaret Pyle, RN

## 2016-09-06 NOTE — Discharge Summary (Signed)
Physician Discharge Summary  Joshua Hall AVW:[id number] DOB: [birth year] DOA: 09/05/2016  PCP: Joshua Squibb, MD  Admit date: 09/05/2016 Discharge date: 09/06/2016  Time spent: GREATER THAN 30 minutes  Recommendations for Outpatient Follow-up:  1. Losartan was added to medication regimen. 2. Consider outpatient referral to neurology for patient's reported tremor.     Discharge Diagnoses:  Principal Problem:   Substernal chest pain Active Problems:   Coronary atherosclerosis of native coronary artery   Essential hypertension, benign   Mixed hyperlipidemia   Tobacco use   PAD (peripheral artery disease) (HCC)   Tremor of right hand   Discharge Condition: Improved  Diet recommendation: Heart healthy  Filed Weights   09/05/16 0528 09/05/16 0818 09/06/16 0500  Weight: 83.9 kg (185 lb) 77.9 kg (171 lb 11.8 oz) 77.9 kg (171 lb 11.8 oz)    History of present illness:  Patient is a 61 year old man with a history of CAD-cardiac catheterization in 2013 showed nonobstructive disease along the LAD and RCA with 99% stenosis of D2 which was too small for intervention and subsequently medical therapy was recommended, PAD-status post stent placement of the left SFA in 2005, HTN, and tobacco use, who presented to the ED on 09/05/16 for chest pain. In the ED, he was afebrile and hemodynamically stable. His lab data were virtually unremarkable; troponin I was 0.02. His chest x-ray revealed no acute cardiopulmonary disease. His EKG revealed normal sinus rhythm with nonspecific ST changes. He was admitted for further evaluation.  Hospital Course:  1. Substernal chest pain in a patient with known CAD. -Patient was restarted on his chronic medications including aspirin, amlodipine, nitroglycerin patch, and Crestor. PPI therapy was continued. -Low-dose metoprolol was started. -Cardiac enzymes were ordered and his Troponin I remained virtually flat ranging from 0.02-0.03; not indicative of  ACS. -Cardiology was consulted. They agreed with medical management and agreed that the flat troponin I was not indicative of ACS. However, Dr. Domenic Hall noted that an ACE inhibitor could be added if there was no additional evidence of ACS and metoprolol could be discontinued as it had caused erectile dysfunction in the past. -2-D echocardiogram ordered and revealed preserved LV function with no evidence of diastolic dysfunction. EF was 55-60%. -Cardiology ordered a Lexiscan Myoview for further evaluation. The results revealed a low risk study with an EF of 51%. -Patient's chest pain resolved. -Patient was discharged on losartan along with his other chronic medications and metoprolol was discontinued. Aspirin was added to his daily regimen as well.  PAD. Patient has a history of stent placement to the left SFA in 2015. He has had some intermittent symptoms of claudication. In-house cardiologist Dr. Domenic Hall recommended that the patient follow-up with his primary cardiologist Dr. Fletcher Hall when discharged. An appointment was scheduled for September.  Hypertension. The patient is treated chronically with amlodipine. His blood pressure ranged from normal to the 829F systolically. -Amlodipine was continued with the addition of metoprolol and ongoing topical nitroglycerin. -Because the stress test was low risk, metoprolol was discontinued in favor of losartan at the time of discharge.  Tobacco abuse. Patient was strongly advised to stop smoking indefinitely.  Hyperlipidemia. Patient is treated chronically with Crestor. It was continued. -Fasting lipid panel revealed a total cholesterol of 154, LDL of 74, HDL of 48, and triglycerides of 160.  Mild right upper extremity tremor. Patient reported having a noticeable tremor that started several months ago. It is primarily in his right hand and occurs at rest and with intention. He denied  family history of essential tremor or Parkinson's disease. He had  no obvious parkinsonian features and no cranial nerve deficits. It did not appear to be severe per exam. -I advised the patient to follow-up with his PCP who could refer him to a neurologist for further evaluation.    Procedures: 1.) Lexiscan Myoview stress test. 2.) Echocardiogram 09/05/2016 Left ventricle: The cavity size was normal. Wall thickness was increased in a pattern of mild LVH. Systolic function was normal. The estimated ejection fraction was in the range of 55% to 60%. Wall motion was normal; there were no regional wall motion abnormalities. Left ventricular diastolic function parameters were normal. - Aortic valve: Valve area (VTI): 2.39 cm^2. Valve area (Vmax): 2.42 cm^2. Valve area (Vmean): 2.15 cm^2. - Left atrium: The atrium was mildly dilated. - Right atrium: The atrium was mildly dilated. - Atrial septum: No defect or patent foramen ovale was identified. - Pulmonary arteries: Systolic pressure was mildly increased. PA peak pressure: 36 mm Hg (S). - Technically adequate study.  Consultations:  Cardiology  Discharge Exam: Vitals:   09/06/16 0713 09/06/16 0800  BP:  (!) 141/83  Pulse: (!) 52 (!) 57  Resp: 13 (!) 21  Temp: 98.6 F (37 C)    General exam: Appears calm and comfortable  Respiratory system: Clear to auscultation. Respiratory effort normal. Cardiovascular system: S1 & S2 heard with 3-2/2 systolic murmur. No pedal edema. Gastrointestinal system: Abdomen is nondistended, soft and nontender. No organomegaly or masses felt. Normal bowel sounds heard. Central nervous system: Alert and oriented. Mild right hand resting and intention tremor. CN 2-12 intact. Extremities: No acute hot red joints. Skin: No rashes, lesions or ulcers Psychiatry: Judgement and insight appear normal. Mood & affect appropriate.   Discharge Instructions   Discharge Instructions    Diet - low sodium heart healthy    Complete by:  As directed    Discharge  instructions    Complete by:  As directed    -Try to stop smoking. -Take medications as prescribed -Discuss referral to a neurologist specialist with your primary doctor for assessment of your tremor.   Increase activity slowly    Complete by:  As directed      Current Discharge Medication List    START taking these medications   Details  aspirin EC 81 MG EC tablet Take 1 tablet (81 mg total) by mouth daily.    losartan (COZAAR) 25 MG tablet Take 1 tablet (25 mg total) by mouth daily. For treatment of high blood pressure and for your heart. Qty: 30 tablet, Refills: 6      CONTINUE these medications which have NOT CHANGED   Details  albuterol (PROVENTIL HFA;VENTOLIN HFA) 108 (90 BASE) MCG/ACT inhaler Inhale 2 puffs into the lungs every 6 (six) hours as needed for wheezing or shortness of breath. Qty: 1 Inhaler, Refills: 0    amLODipine (NORVASC) 5 MG tablet Take 1 tablet (5 mg total) by mouth daily. Qty: 90 tablet, Refills: 3    buPROPion (WELLBUTRIN XL) 150 MG 24 hr tablet TK 1 T PO D Refills: 5    EPIPEN 2-PAK 0.3 MG/0.3ML SOAJ injection Inject 0.3 mg as directed daily as needed. Allergic reaction Refills: 1    nitroGLYCERIN (NITRODUR - DOSED IN MG/24 HR) 0.2 mg/hr patch Place 0.2 mg onto the skin daily.    nitroGLYCERIN (NITROSTAT) 0.4 MG SL tablet Place 0.4 mg under the tongue every 5 (five) minutes as needed for chest pain (3 doses MAX).  ondansetron (ZOFRAN ODT) 4 MG disintegrating tablet 4mg  ODT q4 hours prn nausea/vomit Qty: 10 tablet, Refills: 0    pantoprazole (PROTONIX) 40 MG tablet Take 1 tablet (40 mg total) by mouth daily. Qty: 30 tablet, Refills: 1    rosuvastatin (CRESTOR) 20 MG tablet TK 1 T PO D Refills: 5    traMADol (ULTRAM) 50 MG tablet TAKE 1 TABLET BY MOUTH EVERY 6 HOURS AS NEEDED FOR PAIN. Refills: 0    oxyCODONE-acetaminophen (ROXICET) 5-325 MG tablet Take 1 tablet by mouth every 4 (four) hours as needed. Qty: 30 tablet, Refills: 0       STOP taking these medications     Na Sulfate-K Sulfate-Mg Sulf (SUPREP BOWEL PREP) SOLN        Allergies  Allergen Reactions  . Bee Venom Swelling   Follow-up Information    [name], MD Follow up on 11/02/2016.   Specialty:  Cardiology Why:  2:40 pm  Contact information: [address] [place] [address] [phone]        Joshua Squibb, MD. Schedule an appointment as soon as possible for a visit in 2 week(s).   Specialty:  Internal Medicine Contact information: [place] [address] [phone]            The results of significant diagnostics from this hospitalization (including imaging, microbiology, ancillary and laboratory) are listed below for reference.    Significant Diagnostic Studies: Dg Chest 2 View  Result Date: 09/05/2016 CLINICAL DATA:  Chest pain. EXAM: CHEST  2 VIEW COMPARISON:  09/22/ 2015. FINDINGS: Mediastinum and hilar structures normal. Heart size normal. No focal infiltrate. Pulmonary interstitial edema noted on prior chest x-ray of 11/03/2013 has cleared. No pleural effusion or pneumothorax. IMPRESSION: No acute cardiopulmonary disease noted. Electronically Signed   By: [name]  Register   On: 09/05/2016 06:11   Nm Myocar Multi W/spect Tamela Oddi Motion / Ef  Result Date: 09/06/2016  No diagnostic ST segment changes to indicate ischemia.  Small, mild intensity, partially reversible mid anteroseptal defect suggesting a small ischemic territory.  Moderate sized, moderate intensity, fixed inferior defect most consistent with attenuation artifact rather than scar in light of normal wall motion in this region.  This is a low risk study.  Nuclear stress EF: 51%.     Microbiology: Recent Results (from the past 240 hour(s))  MRSA PCR Screening     Status: Abnormal   Collection Time: 09/05/16  8:12 AM  Result Value Ref Range Status   MRSA by PCR POSITIVE (A) NEGATIVE Final    Comment:        The  GeneXpert MRSA Assay (FDA approved for NASAL specimens only), is one component of a comprehensive MRSA colonization surveillance program. It is not intended to diagnose MRSA infection nor to guide or monitor treatment for MRSA infections. RESULT CALLED TO, READ BACK BY AND VERIFIED WITH: MOTLEY J. AT 1250 ON [id number] BY THOMPSON S.      Labs: Basic Metabolic Panel:  Recent Labs Lab 09/05/16 0532 09/05/16 0938  NA 137  --   K 3.6  --   CL 102  --   CO2 27  --   GLUCOSE 116*  --   BUN 16  --   CREATININE 1.02 0.89  CALCIUM 9.3  --    Liver Function Tests: No results for input(s): AST, ALT, ALKPHOS, BILITOT, PROT, ALBUMIN in the last 168 hours. No results for input(s): LIPASE, AMYLASE in the last  168 hours. No results for input(s): AMMONIA in the last 168 hours. CBC:  Recent Labs Lab 09/05/16 0532 09/05/16 0938  WBC 7.1 6.8  HGB 13.5 12.6*  HCT 36.4* 33.8*  MCV 85.2 85.1  PLT 243 223   Cardiac Enzymes:  Recent Labs Lab 09/05/16 0938 09/05/16 1608 09/05/16 2203  TROPONINI 0.03* 0.03* 0.03*   BNP: BNP (last 3 results) No results for input(s): BNP in the last 8760 hours.  ProBNP (last 3 results) No results for input(s): PROBNP in the last 8760 hours.  CBG: No results for input(s): GLUCAP in the last 168 hours.     Signed:  Nzinga Ferran MD.  [facility] 09/06/2016, 2:16 PM

## 2016-10-11 ENCOUNTER — Telehealth: Payer: Self-pay | Admitting: Cardiovascular Disease

## 2016-10-11 NOTE — Telephone Encounter (Signed)
Paperwork sent to [facility] via interoffice.

## 2016-10-11 NOTE — Telephone Encounter (Signed)
Pt hospitalized 7/25-7/26. He did not see Dr. Fletcher Anon during this admission. He last saw Dr. Fletcher Anon in 2016. [employer] paperwork given to [place].

## 2016-10-11 NOTE — Telephone Encounter (Signed)
Received paper work from [name] on patient Placed in [place].

## 2016-11-02 ENCOUNTER — Ambulatory Visit: Payer: PRIVATE HEALTH INSURANCE | Admitting: Cardiovascular Disease

## 2016-11-11 ENCOUNTER — Emergency Department (HOSPITAL_COMMUNITY)
Admission: EM | Admit: 2016-11-11 | Discharge: 2016-11-11 | Disposition: A | Discharge status: Home / Self Care | Payer: PRIVATE HEALTH INSURANCE | Attending: Emergency Medicine | Admitting: Emergency Medicine

## 2016-11-11 ENCOUNTER — Emergency Department (HOSPITAL_COMMUNITY): Payer: PRIVATE HEALTH INSURANCE

## 2016-11-11 DIAGNOSIS — R109 Unspecified abdominal pain: Secondary | ICD-10-CM | POA: Insufficient documentation

## 2016-11-11 DIAGNOSIS — Z7982 Long term (current) use of aspirin: Secondary | ICD-10-CM | POA: Insufficient documentation

## 2016-11-11 DIAGNOSIS — R0789 Other chest pain: Secondary | ICD-10-CM | POA: Insufficient documentation

## 2016-11-11 DIAGNOSIS — S59911A Unspecified injury of right forearm, initial encounter: Secondary | ICD-10-CM | POA: Diagnosis present

## 2016-11-11 DIAGNOSIS — Y999 Unspecified external cause status: Secondary | ICD-10-CM | POA: Insufficient documentation

## 2016-11-11 DIAGNOSIS — J449 Chronic obstructive pulmonary disease, unspecified: Secondary | ICD-10-CM | POA: Insufficient documentation

## 2016-11-11 DIAGNOSIS — Y9389 Activity, other specified: Secondary | ICD-10-CM | POA: Diagnosis not present

## 2016-11-11 DIAGNOSIS — Y929 Unspecified place or not applicable: Secondary | ICD-10-CM | POA: Insufficient documentation

## 2016-11-11 DIAGNOSIS — S0990XA Unspecified injury of head, initial encounter: Secondary | ICD-10-CM | POA: Insufficient documentation

## 2016-11-11 DIAGNOSIS — I2510 Atherosclerotic heart disease of native coronary artery without angina pectoris: Secondary | ICD-10-CM | POA: Diagnosis not present

## 2016-11-11 DIAGNOSIS — S52201A Unspecified fracture of shaft of right ulna, initial encounter for closed fracture: Secondary | ICD-10-CM | POA: Diagnosis not present

## 2016-11-11 DIAGNOSIS — F17210 Nicotine dependence, cigarettes, uncomplicated: Secondary | ICD-10-CM | POA: Insufficient documentation

## 2016-11-11 DIAGNOSIS — Z79899 Other long term (current) drug therapy: Secondary | ICD-10-CM | POA: Insufficient documentation

## 2016-11-11 DIAGNOSIS — I10 Essential (primary) hypertension: Secondary | ICD-10-CM | POA: Insufficient documentation

## 2016-11-11 LAB — I-STAT CHEM 8, ED
BUN: 9 mg/dL (ref 6–20)
CHLORIDE: 105 mmol/L (ref 101–111)
CREATININE: 1 mg/dL (ref 0.61–1.24)
Calcium, Ion: 1.16 mmol/L (ref 1.15–1.40)
Glucose, Bld: 91 mg/dL (ref 65–99)
HEMATOCRIT: 41 % (ref 39.0–52.0)
Hemoglobin: 13.9 g/dL (ref 13.0–17.0)
POTASSIUM: 3.7 mmol/L (ref 3.5–5.1)
Sodium: 142 mmol/L (ref 135–145)
TCO2: 24 mmol/L (ref 22–32)

## 2016-11-11 LAB — ETHANOL: Alcohol, Ethyl (B): 82 mg/dL — ABNORMAL HIGH (ref <10)

## 2016-11-11 MED ORDER — IOPAMIDOL (ISOVUE-300) INJECTION 61%
100.0000 mL | Freq: Once | INTRAVENOUS | Status: AC | PRN
  Administered 2016-11-11: 100 mL via INTRAVENOUS

## 2016-11-11 NOTE — ED Notes (Signed)
Pt ambulated to bathroom without difficulty.
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
## 2016-11-11 NOTE — Discharge Instructions (Signed)
Wear the splint until followup with Dr. Aline Brochure. Return to the ED if you develop worsening pain, numbness, tingling, or any other concerns.
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
## 2016-11-11 NOTE — ED Notes (Signed)
Gave EKG to DR.Rancour

## 2016-11-11 NOTE — ED Notes (Signed)
Patient transported to CT 

## 2016-11-11 NOTE — ED Notes (Signed)
Pt given water to drink for fluid challenge and tolerated well.

## 2016-11-12 ENCOUNTER — Encounter: Payer: Self-pay | Admitting: Orthopedic Surgery

## 2016-11-13 ENCOUNTER — Telehealth: Payer: Self-pay

## 2016-11-13 ENCOUNTER — Ambulatory Visit (INDEPENDENT_AMBULATORY_CARE_PROVIDER_SITE_OTHER): Payer: PRIVATE HEALTH INSURANCE | Admitting: Orthopedic Surgery

## 2016-11-13 ENCOUNTER — Encounter: Payer: Self-pay | Admitting: Orthopedic Surgery

## 2016-11-13 VITALS — BP 150/84 | HR 99 | Ht 71.25 in | Wt 177.0 lb

## 2016-11-13 DIAGNOSIS — S52224A Nondisplaced transverse fracture of shaft of right ulna, initial encounter for closed fracture: Secondary | ICD-10-CM | POA: Diagnosis not present

## 2016-11-13 MED ORDER — IBUPROFEN 800 MG PO TABS: 800.0000 mg | ORAL_TABLET | Freq: Three times a day (TID) | ORAL | 1 refills | Status: DC | PRN

## 2016-11-13 MED ORDER — HYDROCODONE-ACETAMINOPHEN 5-325 MG PO TABS: 1.0000 | ORAL_TABLET | Freq: Four times a day (QID) | ORAL | 0 refills | Status: DC | PRN

## 2016-11-13 MED ORDER — DIPHENHYDRAMINE HCL 25 MG PO CAPS: 25.0000 mg | ORAL_CAPSULE | ORAL | 2 refills | Status: DC | PRN

## 2016-11-13 NOTE — Patient Instructions (Signed)
Steps to Quit Smoking Smoking tobacco can be bad for your health. It can also affect almost every organ in your body. Smoking puts you and people around you at risk for many serious Teejay Meader-lasting (chronic) diseases. Quitting smoking is hard, but it is one of the best things that you can do for your health. It is never too late to quit. What are the benefits of quitting smoking? When you quit smoking, you lower your risk for getting serious diseases and conditions. They can include:  Lung cancer or lung disease.  Heart disease.  Stroke.  Heart attack.  Not being able to have children (infertility).  Weak bones (osteoporosis) and broken bones (fractures).  If you have coughing, wheezing, and shortness of breath, those symptoms may get better when you quit. You may also get sick less often. If you are pregnant, quitting smoking can help to lower your chances of having a baby of low birth weight. What can I do to help me quit smoking? Talk with your doctor about what can help you quit smoking. Some things you can do (strategies) include:  Quitting smoking totally, instead of slowly cutting back how much you smoke over a period of time.  Going to in-person counseling. You are more likely to quit if you go to many counseling sessions.  Using resources and support systems, such as: ? Online chats with a counselor. ? Phone quitlines. ? Printed self-help materials. ? Support groups or group counseling. ? Text messaging programs. ? Mobile phone apps or applications.  Taking medicines. Some of these medicines may have nicotine in them. If you are pregnant or breastfeeding, do not take any medicines to quit smoking unless your doctor says it is okay. Talk with your doctor about counseling or other things that can help you.  Talk with your doctor about using more than one strategy at the same time, such as taking medicines while you are also going to in-person counseling. This can help make  quitting easier. What things can I do to make it easier to quit? Quitting smoking might feel very hard at first, but there is a lot that you can do to make it easier. Take these steps:  Talk to your family and friends. Ask them to support and encourage you.  Call phone quitlines, reach out to support groups, or work with a counselor.  Ask people who smoke to not smoke around you.  Avoid places that make you want (trigger) to smoke, such as: ? Bars. ? Parties. ? Smoke-break areas at work.  Spend time with people who do not smoke.  Lower the stress in your life. Stress can make you want to smoke. Try these things to help your stress: ? Getting regular exercise. ? Deep-breathing exercises. ? Yoga. ? Meditating. ? Doing a body scan. To do this, close your eyes, focus on one area of your body at a time from head to toe, and notice which parts of your body are tense. Try to relax the muscles in those areas.  Download or buy apps on your mobile phone or tablet that can help you stick to your quit plan. There are many free apps, such as QuitGuide from the CDC (Centers for Disease Control and Prevention). You can find more support from smokefree.gov and other websites.  This information is not intended to replace advice given to you by your health care provider. Make sure you discuss any questions you have with your health care provider. Document Released: 11/25/2008 Document   Revised: 09/27/2015 Document Reviewed: 06/15/2014 Elsevier Interactive Patient Education  2018 [employer] Inc.  

## 2016-11-13 NOTE — Telephone Encounter (Signed)
I called pt to r/s his appt on 11/14/16 with Dr. Rexene Alberts since she will be out of the office sick. No answer at either home nor cell number. If pt calls back, please reschedule his appt with any of the [facility] neurologists that will work with his schedule. Pt is a new patient consult.
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
## 2016-11-14 ENCOUNTER — Ambulatory Visit: Payer: PRIVATE HEALTH INSURANCE | Admitting: Neurology

## 2016-12-03 NOTE — Telephone Encounter (Signed)
I called pt again to get him rescheduled. No answer, left a message asking him to call me back.

## 2016-12-25 ENCOUNTER — Ambulatory Visit (INDEPENDENT_AMBULATORY_CARE_PROVIDER_SITE_OTHER): Payer: PRIVATE HEALTH INSURANCE

## 2016-12-25 ENCOUNTER — Ambulatory Visit (INDEPENDENT_AMBULATORY_CARE_PROVIDER_SITE_OTHER): Payer: PRIVATE HEALTH INSURANCE | Admitting: Orthopedic Surgery

## 2016-12-25 DIAGNOSIS — S5291XD Unspecified fracture of right forearm, subsequent encounter for closed fracture with routine healing: Secondary | ICD-10-CM | POA: Diagnosis not present

## 2016-12-25 DIAGNOSIS — S52224A Nondisplaced transverse fracture of shaft of right ulna, initial encounter for closed fracture: Secondary | ICD-10-CM

## 2016-12-25 MED ORDER — HYDROCODONE-ACETAMINOPHEN 5-325 MG PO TABS: 1.0000 | ORAL_TABLET | Freq: Four times a day (QID) | ORAL | 0 refills | Status: DC | PRN

## 2016-12-25 MED ORDER — IBUPROFEN 800 MG PO TABS: 800.0000 mg | ORAL_TABLET | Freq: Three times a day (TID) | ORAL | 1 refills | Status: DC | PRN
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
## 2017-01-10 ENCOUNTER — Ambulatory Visit: Payer: PRIVATE HEALTH INSURANCE | Admitting: Cardiovascular Disease

## 2017-01-10 NOTE — Progress Notes (Deleted)
Cardiology Office Note   Date:  01/10/2017   ID:  Joshua, Hall Jul 27, 1955, MRN [id number]  PCP:  Celene Squibb, MD  Cardiologist:  Dr. Domenic Polite PV:  Kathlyn Sacramento, MD   No chief complaint on file.     History of Present Illness: Joshua Hall is a 61 y.o. male who presents for *** Joshua Hall is a 61 y.o.male who is here today for a follow-up visit regarding  peripheral arterial disease. He has known history of coronary artery disease mainly affecting diagonal branch, treated medically. Most recent cardiac catheterization in 2013 demonstrating nonobstructive disease of the LAD, with a 99% stenosis to a very small second diagonal branch, too small for intervention, and mild nonobstructive disease of the RCA, the left circumflex was normal. He has chronic medical conditions that include tobacco use, hypertension and hyperlipidemia. He was seen for progressive left calf claudication. ABI was normal on the right side and 0.85 on the left side. I proceeded with angiography in November 2015 which showed: 1. No significant aortoiliac disease. 2. Severe mid to distal left SFA stenosis with evidence of plaque rupture. 3. Successful self-expanding stent placement to the left SFA. He reports resolution of claudication since then.  During last visit, I switched metoprolol to amlodipine due to erectile dysfunction. He reports complete resolution of  ED. He denies any chest pain or shortness of breath.   Past Medical History:  Diagnosis Date  . Colitis   . COPD (chronic obstructive pulmonary disease) ([name])   . Coronary atherosclerosis of native coronary artery    a. cath in 2013 showing nonobstructive disease along the LAD and RCA with 99% stenosis of D2 --> too small for intervention, medical therapy recommended b. normal NST in 10/2013  . Hyperlipidemia   . Hypertension   . PAD (peripheral artery disease) ([name])    a. s/p stent placement to the left SFA in 2015  . Tobacco use      Past Surgical History:  Procedure Laterality Date  . ABDOMINAL AORTAGRAM N/A 12/23/2013   Procedure: ABDOMINAL Maxcine Ham;  Surgeon: [name], MD;  Location: [facility] CATH LAB;  Service: Cardiovascular;  Laterality: N/A;  . CARDIAC CATHETERIZATION  10/2011   [place]  . COLONOSCOPY N/A 03/16/2015   SLF: 1. No source for abdominal pain identified. 2. six colorectal polyps removed. 3. the colon was redundant 4. mild diverticulosis noted in the sigmoid colon 5. small internal hemorroids.   . ESOPHAGOGASTRODUODENOSCOPY N/A 03/16/2015   SLF: 1. No source for abdominal pain identified. 2. mild non-erosive gastritis and duodentitis.   . INSERTION OF MESH N/A 04/13/2015   Procedure: INSERTION OF MESH;  Surgeon: [name], MD;  Location: [place];  Service: General;  Laterality: N/A;  . LEFT HEART CATHETERIZATION WITH CORONARY ANGIOGRAM N/A 10/19/2011   Procedure: LEFT HEART CATHETERIZATION WITH CORONARY ANGIOGRAM;  Surgeon: [name], MD;  Location: [facility] CATH LAB;  Service: Cardiovascular;  Laterality: N/A;  . MOUTH SURGERY    . UMBILICAL HERNIA REPAIR N/A 04/13/2015   Procedure:  UMBILICAL HERNIA REPAIR;  Surgeon: [name], MD;  Location: [place];  Service: General;  Laterality: N/A;     Current Outpatient Medications  Medication Sig Dispense Refill  . albuterol (PROVENTIL HFA;VENTOLIN HFA) 108 (90 BASE) MCG/ACT inhaler Inhale 2 puffs into the lungs every 6 (six) hours as needed for wheezing or shortness of breath. 1 Inhaler 0  . amLODipine (NORVASC) 5 MG  tablet Take 1 tablet (5 mg total) by mouth daily. 90 tablet 3  . aspirin EC 81 MG EC tablet Take 1 tablet (81 mg total) by mouth daily.    [name] buPROPion (WELLBUTRIN XL) 150 MG 24 hr tablet take 1 tablet by mouth daily  5  . diphenhydrAMINE (BENADRYL) 25 mg capsule Take 1 capsule (25 mg total) by mouth every 4 (four) hours as needed. 60 capsule 2  . EPIPEN 2-PAK 0.3 MG/0.3ML SOAJ injection  Inject 0.3 mg as directed daily as needed. Allergic reaction  1  . HYDROcodone-acetaminophen (NORCO) 5-325 MG tablet Take 1 tablet every 6 (six) hours as needed by mouth for moderate pain. 30 tablet 0  . ibuprofen (ADVIL,MOTRIN) 800 MG tablet Take 1 tablet (800 mg total) every 8 (eight) hours as needed by mouth. 90 tablet 1  . losartan (COZAAR) 25 MG tablet Take 1 tablet (25 mg total) by mouth daily. For treatment of high blood pressure and for your heart. 30 tablet 6  . nitroGLYCERIN (NITRODUR - DOSED IN MG/24 HR) 0.2 mg/hr patch Place 0.2 mg onto the skin daily.    . nitroGLYCERIN (NITROSTAT) 0.4 MG SL tablet Place 0.4 mg under the tongue every 5 (five) minutes as needed for chest pain (3 doses MAX).    [name] ondansetron (ZOFRAN ODT) 4 MG disintegrating tablet 4mg  ODT q4 hours prn nausea/vomit (Patient not taking: Reported on 11/13/2016) 10 tablet 0  . pantoprazole (PROTONIX) 40 MG tablet Take 1 tablet (40 mg total) by mouth daily. (Patient not taking: Reported on 11/13/2016) 30 tablet 1  . rosuvastatin (CRESTOR) 20 MG tablet TK 1 T PO D  5  . traMADol (ULTRAM) 50 MG tablet TAKE 1 TABLET BY MOUTH EVERY 6 HOURS AS NEEDED FOR PAIN.  0   No current facility-administered medications for this visit.     Allergies:   Bee venom    Social History:  The patient  reports that he has been smoking cigarettes.  He has a 20.00 pack-year smoking history. he has never used smokeless tobacco. He reports that he drinks alcohol. He reports that he does not use drugs.   Family History:  The patient's ***family history includes Colon cancer in his maternal uncle; Diabetes in his father and mother; Heart disease in his brother; Stroke in his father.    ROS:  Please see the history of present illness.   Otherwise, review of systems are positive for {NONE DEFAULTED:18576::"none"}.   All other systems are reviewed and negative.    PHYSICAL EXAM: VS:  There were no vitals taken for this visit. , BMI There is no height  or weight on file to calculate BMI. GEN: Well nourished, well developed, in no acute distress  HEENT: normal  Neck: no JVD, carotid bruits, or masses Cardiac: ***RRR; no murmurs, rubs, or gallops,no edema  Respiratory:  clear to auscultation bilaterally, normal work of breathing GI: soft, nontender, nondistended, + BS MS: no deformity or atrophy  Skin: warm and dry, no rash Neuro:  Strength and sensation are intact Psych: euthymic mood, full affect   EKG:  EKG {ACTION; IS/IS NIO:27035009} ordered today. The ekg ordered today demonstrates ***   Recent Labs: 09/05/2016: Platelets 223 11/11/2016: BUN 9; Creatinine, Ser 1.00; Hemoglobin 13.9; Potassium 3.7; Sodium 142    Lipid Panel    Component Value Date/Time   CHOL 154 09/06/2016 0525   TRIG 160 (H) 09/06/2016 0525   HDL 48 09/06/2016 0525   CHOLHDL 3.2 09/06/2016 0525  VLDL 32 09/06/2016 0525   LDLCALC 74 09/06/2016 0525      Wt Readings from Last 3 Encounters:  11/13/16 177 lb (80.3 kg)  11/11/16 183 lb (83 kg)  09/06/16 171 lb 11.8 oz (77.9 kg)      Other studies Reviewed: Additional studies/ records that were reviewed today include: ***. Review of the above records demonstrates: ***  No flowsheet data found.    ASSESSMENT AND PLAN:  1.  ***    Disposition:   FU with *** in {gen number 3-79:024097} {Days to years:10300}  Signed,  Kathlyn Sacramento, MD  01/10/2017 10:06 AM    [place]

## 2017-01-11 ENCOUNTER — Encounter: Payer: Self-pay | Admitting: Cardiovascular Disease

## 2017-01-24 DIAGNOSIS — S52201D Unspecified fracture of shaft of right ulna, subsequent encounter for closed fracture with routine healing: Secondary | ICD-10-CM | POA: Insufficient documentation

## 2017-01-25 ENCOUNTER — Ambulatory Visit: Payer: PRIVATE HEALTH INSURANCE | Admitting: Orthopedic Surgery

## 2017-04-22 ENCOUNTER — Telehealth: Payer: Self-pay | Admitting: Cardiovascular Disease

## 2017-04-22 NOTE — Telephone Encounter (Signed)
Received request for medical records from [facility] mail to [facility]   lmov for patient to call office - [facility] packet mailed

## 2017-04-30 ENCOUNTER — Emergency Department (HOSPITAL_COMMUNITY): Payer: Self-pay

## 2017-04-30 ENCOUNTER — Observation Stay (HOSPITAL_COMMUNITY)
Admission: EM | Admit: 2017-04-30 | Discharge: 2017-05-02 | Disposition: A | Discharge status: Home / Self Care | Payer: Self-pay | Attending: Cardiovascular Disease | Admitting: Cardiovascular Disease

## 2017-04-30 ENCOUNTER — Encounter (HOSPITAL_COMMUNITY): Payer: Self-pay | Admitting: Emergency Medicine

## 2017-04-30 ENCOUNTER — Other Ambulatory Visit: Payer: Self-pay

## 2017-04-30 DIAGNOSIS — I200 Unstable angina: Secondary | ICD-10-CM | POA: Diagnosis present

## 2017-04-30 DIAGNOSIS — Z7982 Long term (current) use of aspirin: Secondary | ICD-10-CM | POA: Insufficient documentation

## 2017-04-30 DIAGNOSIS — I2582 Chronic total occlusion of coronary artery: Secondary | ICD-10-CM | POA: Insufficient documentation

## 2017-04-30 DIAGNOSIS — F17210 Nicotine dependence, cigarettes, uncomplicated: Secondary | ICD-10-CM | POA: Insufficient documentation

## 2017-04-30 DIAGNOSIS — I739 Peripheral vascular disease, unspecified: Secondary | ICD-10-CM | POA: Diagnosis present

## 2017-04-30 DIAGNOSIS — J449 Chronic obstructive pulmonary disease, unspecified: Secondary | ICD-10-CM | POA: Insufficient documentation

## 2017-04-30 DIAGNOSIS — E782 Mixed hyperlipidemia: Secondary | ICD-10-CM | POA: Insufficient documentation

## 2017-04-30 DIAGNOSIS — I2510 Atherosclerotic heart disease of native coronary artery without angina pectoris: Secondary | ICD-10-CM | POA: Diagnosis present

## 2017-04-30 DIAGNOSIS — I10 Essential (primary) hypertension: Secondary | ICD-10-CM | POA: Insufficient documentation

## 2017-04-30 DIAGNOSIS — I70213 Atherosclerosis of native arteries of extremities with intermittent claudication, bilateral legs: Secondary | ICD-10-CM | POA: Insufficient documentation

## 2017-04-30 DIAGNOSIS — I25110 Atherosclerotic heart disease of native coronary artery with unstable angina pectoris: Principal | ICD-10-CM | POA: Insufficient documentation

## 2017-04-30 DIAGNOSIS — Z720 Tobacco use: Secondary | ICD-10-CM | POA: Diagnosis present

## 2017-04-30 LAB — BASIC METABOLIC PANEL
Anion gap: 14 (ref 5–15)
BUN: 19 mg/dL (ref 6–20)
CALCIUM: 9.7 mg/dL (ref 8.9–10.3)
CO2: 26 mmol/L (ref 22–32)
CREATININE: 1.03 mg/dL (ref 0.61–1.24)
Chloride: 98 mmol/L — ABNORMAL LOW (ref 101–111)
Glucose, Bld: 108 mg/dL — ABNORMAL HIGH (ref 65–99)
Potassium: 3.3 mmol/L — ABNORMAL LOW (ref 3.5–5.1)
SODIUM: 138 mmol/L (ref 135–145)

## 2017-04-30 LAB — CBC
HCT: 40.9 % (ref 39.0–52.0)
Hemoglobin: 14.6 g/dL (ref 13.0–17.0)
MCH: 31.1 pg (ref 26.0–34.0)
MCHC: 35.7 g/dL (ref 30.0–36.0)
MCV: 87 fL (ref 78.0–100.0)
PLATELETS: 272 10*3/uL (ref 150–400)
RBC: 4.7 MIL/uL (ref 4.22–5.81)
RDW: 14 % (ref 11.5–15.5)
WBC: 8.5 10*3/uL (ref 4.0–10.5)

## 2017-04-30 LAB — I-STAT TROPONIN, ED: TROPONIN I, POC: 0.05 ng/mL (ref 0.00–0.08)

## 2017-04-30 LAB — PROTIME-INR
INR: 0.86
PROTHROMBIN TIME: 11.6 s (ref 11.4–15.2)

## 2017-04-30 LAB — APTT: aPTT: 30 seconds (ref 24–36)

## 2017-04-30 MED ORDER — NITROGLYCERIN 0.4 MG SL SUBL
0.4000 mg | SUBLINGUAL_TABLET | SUBLINGUAL | Status: AC | PRN
  Administered 2017-05-01 (×3): 0.4 mg via SUBLINGUAL
  Filled 2017-04-30 (×3): qty 1

## 2017-04-30 MED ORDER — ASPIRIN 81 MG PO CHEW
324.0000 mg | CHEWABLE_TABLET | Freq: Once | ORAL | Status: AC
  Administered 2017-04-30: 324 mg via ORAL
  Filled 2017-04-30: qty 4

## 2017-04-30 NOTE — ED Notes (Addendum)
Bilateral pedal pulses heard with doppler. Marked with marker the location pulses were heard.

## 2017-04-30 NOTE — ED Triage Notes (Signed)
Pt c/o CP X1 week, hx of previous MI. L sided CP with no radiation and increased SOB, 1 episode of V/. 1 nitro PTA with minimal decrease in CP.

## 2017-04-30 NOTE — ED Provider Notes (Signed)
[facility] EMERGENCY DEPARTMENT Provider Note   CSN: [id number] Arrival date & time: 04/30/17  2253     History   Chief Complaint Chief Complaint  Patient presents with  . Chest Pain    HPI Joshua Hall is a 62 y.o. male.  The history is provided by the patient and a significant other.  Chest Pain   This is a new problem. The current episode started more than 2 days ago. The problem occurs daily. The problem has been gradually worsening. The pain is associated with exertion. The pain is present in the substernal region. The pain is moderate. The quality of the pain is described as pressure-like and sharp. The pain does not radiate. Associated symptoms include shortness of breath and vomiting. Pertinent negatives include no abdominal pain, no diaphoresis, no fever and no syncope. Associated symptoms comments: LightHeadedness. He has tried nitroglycerin for the symptoms. The treatment provided no relief. Risk factors include male gender and smoking/tobacco exposure.  His past medical history is significant for CAD.  Patient with history of COPD, CAD presents with chest pain.  He reports multiple episodes of chest pain over the past week.  He reports they are increasing in frequency.  The episodes can last 30 seconds, up to several minutes.  He reports now with exertion he becomes short of breath.   Past Medical History:  Diagnosis Date  . Colitis   . COPD (chronic obstructive pulmonary disease) ([place])   . Coronary atherosclerosis of native coronary artery    a. cath in 2013 showing nonobstructive disease along the LAD and RCA with 99% stenosis of D2 --> too small for intervention, medical therapy recommended b. normal NST in 10/2013  . Hyperlipidemia   . Hypertension   . PAD (peripheral artery disease) ([place])    a. s/p stent placement to the left SFA in 2015  . Tobacco use     Patient Active Problem List   Diagnosis Date Noted  . Closed fracture of shaft of right ulna with  routine healing 11/10/16 01/24/2017  . Tremor of right hand 09/06/2016  . Colon polyps 08/04/2015  . Dyspepsia   . Diarrhea 02/17/2015  . Drug-induced erectile dysfunction 04/25/2014  . PAD (peripheral artery disease) ([place]) 12/15/2013  . Substernal chest pain 11/03/2013  . Unstable angina ([place]) 11/03/2013  . Tobacco use 11/03/2013  . Periumbilical pain 60/73/7106  . Essential hypertension, benign 01/20/2012  . Coronary atherosclerosis of native coronary artery 10/26/2011  . Mixed hyperlipidemia 10/20/2011    Past Surgical History:  Procedure Laterality Date  . ABDOMINAL AORTAGRAM N/A 12/23/2013   Procedure: ABDOMINAL Maxcine Ham;  Surgeon: [name], MD;  Location: [facility] CATH LAB;  Service: Cardiovascular;  Laterality: N/A;  . CARDIAC CATHETERIZATION  10/2011   [place]  . COLONOSCOPY N/A 03/16/2015   SLF: 1. No source for abdominal pain identified. 2. six colorectal polyps removed. 3. the colon was redundant 4. mild diverticulosis noted in the sigmoid colon 5. small internal hemorroids.   . ESOPHAGOGASTRODUODENOSCOPY N/A 03/16/2015   SLF: 1. No source for abdominal pain identified. 2. mild non-erosive gastritis and duodentitis.   . INSERTION OF MESH N/A 04/13/2015   Procedure: INSERTION OF MESH;  Surgeon: [name], MD;  Location: [facility];  Service: General;  Laterality: N/A;  . LEFT HEART CATHETERIZATION WITH CORONARY ANGIOGRAM N/A 10/19/2011   Procedure: LEFT HEART CATHETERIZATION WITH CORONARY ANGIOGRAM;  Surgeon: [name], MD;  Location: [facility] CATH LAB;  Service: Cardiovascular;  Laterality: N/A;  . MOUTH SURGERY    . UMBILICAL HERNIA REPAIR N/A 04/13/2015   Procedure:  UMBILICAL HERNIA REPAIR;  Surgeon: [name], MD;  Location: [facility];  Service: General;  Laterality: N/A;       Home Medications    Prior to Admission medications   Medication Sig Start Date End Date Taking? Authorizing Provider  albuterol (PROVENTIL  HFA;VENTOLIN HFA) 108 (90 BASE) MCG/ACT inhaler Inhale 2 puffs into the lungs every 6 (six) hours as needed for wheezing or shortness of breath. 01/21/12   Kathie Dike, MD  amLODipine (NORVASC) 5 MG tablet Take 1 tablet (5 mg total) by mouth daily. 11/02/14   [name], MD  aspirin EC 81 MG EC tablet Take 1 tablet (81 mg total) by mouth daily. 09/07/16   Rexene Alberts, MD  buPROPion (WELLBUTRIN XL) 150 MG 24 hr tablet take 1 tablet by mouth daily 07/22/15   [provider]  diphenhydrAMINE (BENADRYL) 25 mg capsule Take 1 capsule (25 mg total) by mouth every 4 (four) hours as needed. 11/13/16   Carole Civil, MD  EPIPEN 2-PAK 0.3 MG/0.3ML SOAJ injection Inject 0.3 mg as directed daily as needed. Allergic reaction 10/08/14   [provider]  HYDROcodone-acetaminophen (NORCO) 5-325 MG tablet Take 1 tablet every 6 (six) hours as needed by mouth for moderate pain. 12/25/16   Carole Civil, MD  ibuprofen (ADVIL,MOTRIN) 800 MG tablet Take 1 tablet (800 mg total) every 8 (eight) hours as needed by mouth. 12/25/16   Carole Civil, MD  losartan (COZAAR) 25 MG tablet Take 1 tablet (25 mg total) by mouth daily. For treatment of high blood pressure and for your heart. 09/07/16   Rexene Alberts, MD  nitroGLYCERIN (NITRODUR - DOSED IN MG/24 HR) 0.2 mg/hr patch Place 0.2 mg onto the skin daily.    [provider]  nitroGLYCERIN (NITROSTAT) 0.4 MG SL tablet Place 0.4 mg under the tongue every 5 (five) minutes as needed for chest pain (3 doses MAX).    [provider]  ondansetron (ZOFRAN ODT) 4 MG disintegrating tablet 4mg  ODT q4 hours prn nausea/vomit Patient not taking: Reported on 11/13/2016 02/21/15   Elnora Morrison, MD  pantoprazole (PROTONIX) 40 MG tablet Take 1 tablet (40 mg total) by mouth daily. Patient not taking: Reported on 11/13/2016 11/04/13   Kathie Dike, MD  rosuvastatin (CRESTOR) 20 MG tablet TK 1 T PO D 07/22/15   [provider]    traMADol (ULTRAM) 50 MG tablet TAKE 1 TABLET BY MOUTH EVERY 6 HOURS AS NEEDED FOR PAIN. 02/15/15   [provider]    Family History Family History  Problem Relation Age of Onset  . Diabetes Mother   . Diabetes Father   . Stroke Father   . Colon cancer Maternal Uncle   . Heart disease Brother        Died of MI at 63    Social History Social History   Tobacco Use  . Smoking status: Current Every Day Smoker    Packs/day: 0.50    Years: 40.00    Pack years: 20.00    Types: Cigarettes  . Smokeless tobacco: Never Used  . Tobacco comment: smokes 3 ciggerettes daily for the past 2 months  Substance Use Topics  . Alcohol use: Yes    Alcohol/week: 0.0 oz    Comment: Occasional  . Drug use: No     Allergies   Bee venom   Review of  Systems Review of Systems  Constitutional: Negative for diaphoresis and fever.  Respiratory: Positive for shortness of breath.   Cardiovascular: Positive for chest pain. Negative for syncope.  Gastrointestinal: Positive for vomiting. Negative for abdominal pain and blood in stool.  Musculoskeletal:       Left leg pain with exertion  Neurological: Negative for syncope.  All other systems reviewed and are negative.    Physical Exam Updated Vital Signs BP (!) 171/97 (BP Location: Left Arm)   Pulse 80   Temp 98.7 F (37.1 C) (Oral)   Resp (!) 26   Wt 83.9 kg (185 lb)   SpO2 95%   BMI 25.62 kg/m   Physical Exam CONSTITUTIONAL: Ill-appearing HEAD: Normocephalic/atraumatic EYES: EOMI/PERRL ENMT: Mucous membranes moist NECK: supple no meningeal signs SPINE/BACK:entire spine nontender CV: S1/S2 noted, no murmurs/rubs/gallops noted LUNGS: Lungs are clear to auscultation bilaterally, no apparent distress ABDOMEN: soft, nontender, no rebound or guarding, bowel sounds noted throughout abdomen GU:no cva tenderness NEURO: Pt is awake/alert/appropriate, moves all extremitiesx4.  No facial droop.   EXTREMITIES: pulses normal/equal,  full ROM, palpable pulse in right foot, dopplerable pulse in left foot. SKIN: warm, color normal PSYCH: no abnormalities of mood noted, alert and oriented to situation   ED Treatments / Results  Labs (all labs ordered are listed, but only abnormal results are displayed) Labs Reviewed  BASIC METABOLIC PANEL - Abnormal; Notable for the following components:      Result Value   Potassium 3.3 (*)    Chloride 98 (*)    Glucose, Bld 108 (*)    All other components within normal limits  TROPONIN I - Abnormal; Notable for the following components:   Troponin I 0.05 (*)    All other components within normal limits  CBC  PROTIME-INR  APTT  TROPONIN I  HEPARIN LEVEL (UNFRACTIONATED)  I-STAT TROPONIN, ED    EKG  EKG Interpretation  Date/Time:  Tuesday April 30 2017 23:01:05 EDT Ventricular Rate:  81 PR Interval:    QRS Duration: 83 QT Interval:  390 QTC Calculation: 453 R Axis:   43 Text Interpretation:  Sinus rhythm Baseline wander in lead(s) V2 V3 V4 No significant change since last tracing Confirmed by Ripley Fraise [phone]) on 04/30/2017 11:05:51 PM       Radiology Dg Chest 2 View  Result Date: 04/30/2017 CLINICAL DATA:  Chest pain.  Cough and shortness of breath. EXAM: CHEST - 2 VIEW COMPARISON:  Radiograph 09/05/2016.  CT 11/11/2016 FINDINGS: The cardiomediastinal contours are normal. The lungs are clear. Pulmonary vasculature is normal. No consolidation, pleural effusion, or pneumothorax. No acute osseous abnormalities are seen. IMPRESSION: No acute pulmonary process. Electronically Signed   By: Jeb Levering [name]   On: 04/30/2017 23:30    Procedures Procedures  CRITICAL CARE Performed by: Sharyon Cable Total critical care time: 40 minutes Critical care time was exclusive of separately billable procedures and treating other patients. Critical care was necessary to treat or prevent imminent or life-threatening deterioration. Critical care was time spent  personally by me on the following activities: development of treatment plan with patient and/or surrogate as well as nursing, discussions with consultants, evaluation of patient's response to treatment, examination of patient, obtaining history from patient or surrogate, ordering and performing treatments and interventions, ordering and review of laboratory studies, ordering and review of radiographic studies, pulse oximetry and re-evaluation of patient's condition. Patient with unstable angina/non-STEMI presents to the emergency department requiring nitroglycerin and heparin drip.  Also required cardiology  consultation and admission Medications Ordered in ED Medications  heparin ADULT infusion 100 units/mL (25000 units/229mL sodium chloride 0.45%) (1,000 Units/hr Intravenous New Bag/Given 05/01/17 0204)  aspirin chewable tablet 324 mg (324 mg Oral Given 04/30/17 2343)  nitroGLYCERIN (NITROSTAT) SL tablet 0.4 mg (0.4 mg Sublingual Given 05/01/17 0108)  heparin bolus via infusion 4,000 Units (4,000 Units Intravenous Bolus from Bag 05/01/17 0211)     Initial Impression / Assessment and Plan / ED Course  I have reviewed the triage vital signs and the nursing notes.  Pertinent labs & imaging results that were available during my care of the patient were reviewed by me and considered in my medical decision making (see chart for details).     11:40 PM Patient with known history of nonobstructive CAD from cardiac cath in 2013.  Now having symptoms of angina.  I feel he would need to be admitted 2:45 AM I did discuss the case with cardiology fellow at [facility].  Since patient is improved, will admit to [facility]  Discussed with Dr. Olevia Bowens for admission Recheck troponin, then likely admit.  Overall patient is improved, he has mild pain at this time but he did respond to nitroglycerin and aspirin His initial troponin was mildly elevated.  No EKG changes.  Strong suspicion for unstable angina due to  history of CAD with worsening new onset chest pain   Final Clinical Impressions(s) / ED Diagnoses   Final diagnoses:  Unstable angina [facility])    ED Discharge Orders    None       Ripley Fraise, MD 05/01/17 0246

## 2017-05-01 ENCOUNTER — Encounter (HOSPITAL_COMMUNITY): Payer: Self-pay | Admitting: Internal Medicine

## 2017-05-01 ENCOUNTER — Observation Stay (HOSPITAL_COMMUNITY): Payer: Self-pay

## 2017-05-01 ENCOUNTER — Encounter (HOSPITAL_COMMUNITY)
Admission: EM | Disposition: A | Discharge status: Home / Self Care | Payer: Self-pay | Source: Home / Self Care | Attending: Cardiovascular Disease

## 2017-05-01 DIAGNOSIS — I200 Unstable angina: Secondary | ICD-10-CM

## 2017-05-01 DIAGNOSIS — I25110 Atherosclerotic heart disease of native coronary artery with unstable angina pectoris: Secondary | ICD-10-CM

## 2017-05-01 DIAGNOSIS — J449 Chronic obstructive pulmonary disease, unspecified: Secondary | ICD-10-CM | POA: Diagnosis present

## 2017-05-01 DIAGNOSIS — I739 Peripheral vascular disease, unspecified: Secondary | ICD-10-CM | POA: Diagnosis present

## 2017-05-01 HISTORY — PX: LEFT HEART CATH AND CORONARY ANGIOGRAPHY: CATH118249

## 2017-05-01 LAB — BASIC METABOLIC PANEL
ANION GAP: 11 (ref 5–15)
BUN: 20 mg/dL (ref 6–20)
CHLORIDE: 100 mmol/L — AB (ref 101–111)
CO2: 25 mmol/L (ref 22–32)
Calcium: 8.7 mg/dL — ABNORMAL LOW (ref 8.9–10.3)
Creatinine, Ser: 0.92 mg/dL (ref 0.61–1.24)
GFR calc non Af Amer: >60 mL/min (ref >60)
Glucose, Bld: 110 mg/dL — ABNORMAL HIGH (ref 65–99)
Potassium: 3.6 mmol/L (ref 3.5–5.1)
SODIUM: 136 mmol/L (ref 135–145)

## 2017-05-01 LAB — PHOSPHORUS: PHOSPHORUS: 4.2 mg/dL (ref 2.5–4.6)

## 2017-05-01 LAB — TROPONIN I
TROPONIN I: 0.05 ng/mL — AB (ref <0.03)
TROPONIN I: 0.04 ng/mL — AB (ref <0.03)
Troponin I: 0.05 ng/mL (ref <0.03)

## 2017-05-01 LAB — MAGNESIUM: MAGNESIUM: 1.7 mg/dL (ref 1.7–2.4)

## 2017-05-01 LAB — HEPARIN LEVEL (UNFRACTIONATED): Heparin Unfractionated: 0.3 IU/mL (ref 0.30–0.70)

## 2017-05-01 SURGERY — LEFT HEART CATH AND CORONARY ANGIOGRAPHY
Anesthesia: LOCAL

## 2017-05-01 MED ORDER — HEPARIN (PORCINE) IN NACL 100-0.45 UNIT/ML-% IJ SOLN
1000.0000 [IU]/h | INTRAMUSCULAR | Status: DC
  Administered 2017-05-01: 1000 [IU]/h via INTRAVENOUS
  Filled 2017-05-01: qty 250

## 2017-05-01 MED ORDER — ASPIRIN EC 81 MG PO TBEC: 81.0000 mg | DELAYED_RELEASE_TABLET | Freq: Every day | ORAL | Status: DC

## 2017-05-01 MED ORDER — MIDAZOLAM HCL 2 MG/2ML IJ SOLN
INTRAMUSCULAR | Status: DC | PRN
  Administered 2017-05-01: 1 mg via INTRAVENOUS

## 2017-05-01 MED ORDER — LIDOCAINE HCL (PF) 1 % IJ SOLN
INTRAMUSCULAR | Status: DC | PRN
  Administered 2017-05-01: 2 mL via INTRADERMAL

## 2017-05-01 MED ORDER — IOPAMIDOL (ISOVUE-370) INJECTION 76%
INTRAVENOUS | Status: DC | PRN
  Administered 2017-05-01: 55 mL via INTRA_ARTERIAL

## 2017-05-01 MED ORDER — ROSUVASTATIN CALCIUM 20 MG PO TABS
20.0000 mg | ORAL_TABLET | Freq: Every day | ORAL | Status: DC
  Administered 2017-05-01: 21:00:00 20 mg via ORAL
  Filled 2017-05-01: qty 1

## 2017-05-01 MED ORDER — ASPIRIN 81 MG PO CHEW: 324.0000 mg | CHEWABLE_TABLET | ORAL | Status: DC

## 2017-05-01 MED ORDER — SODIUM CHLORIDE 0.9% FLUSH: 3.0000 mL | INTRAVENOUS | Status: DC | PRN

## 2017-05-01 MED ORDER — HEPARIN (PORCINE) IN NACL 2-0.9 UNIT/ML-% IJ SOLN
INTRAMUSCULAR | Status: AC | PRN
  Administered 2017-05-01: 1000 mL

## 2017-05-01 MED ORDER — VERAPAMIL HCL 2.5 MG/ML IV SOLN
INTRAVENOUS | Status: DC | PRN
  Administered 2017-05-01: 10 mL via INTRA_ARTERIAL

## 2017-05-01 MED ORDER — HEPARIN BOLUS VIA INFUSION
4000.0000 [IU] | Freq: Once | INTRAVENOUS | Status: AC
  Administered 2017-05-01: 4000 [IU] via INTRAVENOUS

## 2017-05-01 MED ORDER — ONDANSETRON HCL 4 MG/2ML IJ SOLN: 4.0000 mg | Freq: Four times a day (QID) | INTRAMUSCULAR | Status: DC | PRN

## 2017-05-01 MED ORDER — SODIUM CHLORIDE 0.9 % IV SOLN
INTRAVENOUS | Status: AC
  Administered 2017-05-01: 18:00:00 via INTRAVENOUS

## 2017-05-01 MED ORDER — BUPROPION HCL ER (XL) 150 MG PO TB24
150.0000 mg | ORAL_TABLET | Freq: Every day | ORAL | Status: DC
  Administered 2017-05-01 – 2017-05-02 (×2): 150 mg via ORAL
  Filled 2017-05-01 (×4): qty 1

## 2017-05-01 MED ORDER — ACETAMINOPHEN 650 MG RE SUPP: 650.0000 mg | Freq: Four times a day (QID) | RECTAL | Status: DC | PRN

## 2017-05-01 MED ORDER — MAGNESIUM SULFATE 2 GM/50ML IV SOLN
2.0000 g | Freq: Once | INTRAVENOUS | Status: AC
  Administered 2017-05-01: 2 g via INTRAVENOUS
  Filled 2017-05-01: qty 50

## 2017-05-01 MED ORDER — ACETAMINOPHEN 325 MG PO TABS: 650.0000 mg | ORAL_TABLET | Freq: Four times a day (QID) | ORAL | Status: DC | PRN

## 2017-05-01 MED ORDER — FENTANYL CITRATE (PF) 100 MCG/2ML IJ SOLN
INTRAMUSCULAR | Status: DC | PRN
  Administered 2017-05-01: 50 ug via INTRAVENOUS

## 2017-05-01 MED ORDER — ASPIRIN EC 81 MG PO TBEC
81.0000 mg | DELAYED_RELEASE_TABLET | Freq: Every day | ORAL | Status: DC
  Administered 2017-05-01 – 2017-05-02 (×2): 81 mg via ORAL
  Filled 2017-05-01 (×2): qty 1

## 2017-05-01 MED ORDER — SODIUM CHLORIDE 0.9% FLUSH
3.0000 mL | Freq: Two times a day (BID) | INTRAVENOUS | Status: DC
  Administered 2017-05-01: 21:00:00 3 mL via INTRAVENOUS

## 2017-05-01 MED ORDER — ASPIRIN 300 MG RE SUPP: 300.0000 mg | RECTAL | Status: DC

## 2017-05-01 MED ORDER — LIDOCAINE HCL 1 % IJ SOLN
INTRAMUSCULAR | Status: AC
  Filled 2017-05-01: qty 20

## 2017-05-01 MED ORDER — SODIUM CHLORIDE 0.9 % IV SOLN: INTRAVENOUS | Status: DC

## 2017-05-01 MED ORDER — SODIUM CHLORIDE 0.9 % IV SOLN: 250.0000 mL | INTRAVENOUS | Status: DC | PRN

## 2017-05-01 MED ORDER — ACETAMINOPHEN 325 MG PO TABS: 650.0000 mg | ORAL_TABLET | ORAL | Status: DC | PRN

## 2017-05-01 MED ORDER — HEPARIN (PORCINE) IN NACL 2-0.9 UNIT/ML-% IJ SOLN
INTRAMUSCULAR | Status: AC
  Filled 2017-05-01: qty 1000

## 2017-05-01 MED ORDER — IOPAMIDOL (ISOVUE-370) INJECTION 76%
INTRAVENOUS | Status: AC
  Filled 2017-05-01: qty 100

## 2017-05-01 MED ORDER — MIDAZOLAM HCL 2 MG/2ML IJ SOLN
INTRAMUSCULAR | Status: AC
  Filled 2017-05-01: qty 2

## 2017-05-01 MED ORDER — FENTANYL CITRATE (PF) 100 MCG/2ML IJ SOLN
INTRAMUSCULAR | Status: AC
  Filled 2017-05-01: qty 2

## 2017-05-01 MED ORDER — HEPARIN SODIUM (PORCINE) 1000 UNIT/ML IJ SOLN
INTRAMUSCULAR | Status: AC
  Filled 2017-05-01: qty 1

## 2017-05-01 MED ORDER — ONDANSETRON HCL 4 MG PO TABS: 4.0000 mg | ORAL_TABLET | Freq: Four times a day (QID) | ORAL | Status: DC | PRN

## 2017-05-01 MED ORDER — POTASSIUM CHLORIDE CRYS ER 20 MEQ PO TBCR
40.0000 meq | EXTENDED_RELEASE_TABLET | Freq: Once | ORAL | Status: AC
  Administered 2017-05-01: 40 meq via ORAL
  Filled 2017-05-01: qty 2

## 2017-05-01 MED ORDER — HEPARIN SODIUM (PORCINE) 1000 UNIT/ML IJ SOLN
INTRAMUSCULAR | Status: DC | PRN
  Administered 2017-05-01: 4000 [IU] via INTRAVENOUS

## 2017-05-01 MED ORDER — SODIUM CHLORIDE 0.9% FLUSH: 3.0000 mL | Freq: Two times a day (BID) | INTRAVENOUS | Status: DC

## 2017-05-01 MED ORDER — AMLODIPINE BESYLATE 5 MG PO TABS
5.0000 mg | ORAL_TABLET | Freq: Every day | ORAL | Status: DC
  Administered 2017-05-01 – 2017-05-02 (×2): 5 mg via ORAL
  Filled 2017-05-01 (×2): qty 1

## 2017-05-01 MED ORDER — VERAPAMIL HCL 2.5 MG/ML IV SOLN
INTRAVENOUS | Status: AC
  Filled 2017-05-01: qty 2

## 2017-05-01 MED ORDER — NITROGLYCERIN 0.4 MG SL SUBL: 0.4000 mg | SUBLINGUAL_TABLET | SUBLINGUAL | Status: DC | PRN

## 2017-05-01 SURGICAL SUPPLY — 11 items
BAND CMPR LRG ZPHR (HEMOSTASIS) ×1
BAND ZEPHYR COMPRESS 30 LONG (HEMOSTASIS) ×2
CATH OPTITORQUE JACKY 4.0 5F (CATHETERS) ×2
INQWIRE 1.5J .035X260CM (WIRE) ×2
KIT HEART LEFT (KITS) ×2
NDL PERC 21GX4CM (NEEDLE) IMPLANT
NEEDLE PERC 21GX4CM (NEEDLE) ×2
PACK CARDIAC CATHETERIZATION (CUSTOM PROCEDURE TRAY) ×2
SHEATH RAIN RADIAL 21G 6FR (SHEATH) ×2
TRANSDUCER W/STOPCOCK (MISCELLANEOUS) ×2
TUBING CIL FLEX 10 FLL-RA (TUBING) ×2

## 2017-05-01 NOTE — Progress Notes (Addendum)
Pt received from [place] alert and oriented X4, IVF infusing and on monitor.  Consent signed for cath procedure.  No complaint of any discomfort at this time.

## 2017-05-01 NOTE — Progress Notes (Signed)
Report given to [facility] 

## 2017-05-01 NOTE — Progress Notes (Signed)
R radial site stable, TR band removed 3 cc - now has at total of 12 cc remaining. R neurovascular status intact. Pt. Denies pain, or paraesthesia to r hand. Pt instructed to keep righand at level of heat. Pt. Requested food- pt. Eating [place] sandwich. Monitoring.

## 2017-05-01 NOTE — Progress Notes (Signed)
r hand neurovascular status intact. Pt. Denies pain or parasthetia to right hand. 3 cc removed from TR band- total of 9 cc remain. Monitoring. Awaiting bed assignment.

## 2017-05-01 NOTE — H&P (Signed)
4        History and Physical    MCCRAE SPECIALE VWU:[id number] DOB: [birth year] DOA: 04/30/2017  PCP: Celene Squibb, MD   Patient coming from: Home.  I have personally briefly reviewed patient's old medical records in [place]  Chief Complaint: Chest pain.  HPI: Joshua Hall is a 62 y.o. male with medical history significant of colitis, COPD, CAD, history of stent placement to the left SFA, hyperlipidemia, hypertension, PAD, tobacco use who is coming to the emergency department with complaints of sharp central chest pain for 1 week and dyspnea on exertion for about 2 weeks.  The pain can happen at rest or while exerting.  No relieving or exacerbating factors.  He mentions that the pain sometimes radiates to the left and sometimes radiates to the right.  He denies nausea, dizziness, orthopnea or recent pitting edema of the lower extremities.  However, complains of having an episode of diaphoresis 2 days ago while having chest pain.  He also complains of having some episodes of orthopnea for the past 2 weeks.  He denies fever, headache, sore throat, hemoptysis, abdominal pain, nausea, emesis, diarrhea, constipation, melena or hematochezia.  Denies dysuria, frequency or hematuria.  No polyuria, polydipsia or blurred vision.  No heat or cold intolerance.  Denies skin rashes.  ED Course: Initial vital signs temperature 98.48F, pulse 80, respirations 26, blood pressure 171/97 mmHg and O2 sat 95% on room air.he was started on heparin infusion in the emergency department.  I added K Dur 40 mEq p.o. x1 and magnesium sulfate 2 g IVPB.  EKG was sinus rhythm with baseline wander in leads V2, V3 and V4.Troponin levels x2 has been 0.05 mg/dL.  CBC was normal.  PT/INR and APTT were normal.  Sodium was 138, potassium 3.3, chloride 98 and CO2 26 millimolar/L.  His blood glucose was 108 mg/dL.  Renal function was normal.  Magnesium was 1.7 and phosphorus 4.2 mg/dL.  His chest radiograph did not show any  acute cardiopulmonary pathology.  Review of Systems: As per HPI otherwise 10 point review of systems negative.    Past Medical History:  Diagnosis Date  . Colitis   . COPD (chronic obstructive pulmonary disease) ([place])   . Coronary atherosclerosis of native coronary artery    a. cath in 2013 showing nonobstructive disease along the LAD and RCA with 99% stenosis of D2 --> too small for intervention, medical therapy recommended b. normal NST in 10/2013  . Hyperlipidemia   . Hypertension   . PAD (peripheral artery disease) ([place])    a. s/p stent placement to the left SFA in 2015  . Tobacco use     Past Surgical History:  Procedure Laterality Date  . ABDOMINAL AORTAGRAM N/A 12/23/2013   Procedure: ABDOMINAL Maxcine Ham;  Surgeon: [name], MD;  Location: [facility] CATH LAB;  Service: Cardiovascular;  Laterality: N/A;  . CARDIAC CATHETERIZATION  10/2011   [place]  . COLONOSCOPY N/A 03/16/2015   SLF: 1. No source for abdominal pain identified. 2. six colorectal polyps removed. 3. the colon was redundant 4. mild diverticulosis noted in the sigmoid colon 5. small internal hemorroids.   . ESOPHAGOGASTRODUODENOSCOPY N/A 03/16/2015   SLF: 1. No source for abdominal pain identified. 2. mild non-erosive gastritis and duodentitis.   . INSERTION OF MESH N/A 04/13/2015   Procedure: INSERTION OF MESH;  Surgeon: [name], MD;  Location: [facility];  Service: General;  Laterality: N/A;  . LEFT  HEART CATHETERIZATION WITH CORONARY ANGIOGRAM N/A 10/19/2011   Procedure: LEFT HEART CATHETERIZATION WITH CORONARY ANGIOGRAM;  Surgeon: [name], MD;  Location[facility] C[facility];  Service: Cardiovascular;  Laterality: N/A;  . MOUTH SURGERY    . UMBILICAL HERNIA REPAIR N/A 04/13/2015   Procedure:  UMBILICAL HERNIA REPAIR;  Surgeon: [name], MD;  Location: [facility];  Service: General;  Laterality: N/A;     reports that he has been smoking cigarettes.  He has a 20.00  pack-year smoking history. he has never used smokeless tobacco. He reports that he drinks alcohol. He reports that he does not use drugs.  Allergies  Allergen Reactions  . Bee Venom Swelling    Family History  Problem Relation Age of Onset  . Diabetes Mother   . Diabetes Father   . Stroke Father   . Colon cancer Maternal Uncle   . Heart disease Brother        Died of MI at 77   Prior to Admission medications   Medication Sig Start Date End Date Taking? Authorizing Provider  albuterol (PROVENTIL HFA;VENTOLIN HFA) 108 (90 BASE) MCG/ACT inhaler Inhale 2 puffs into the lungs every 6 (six) hours as needed for wheezing or shortness of breath. 01/21/12   Kathie Dike, MD  amLODipine (NORVASC) 5 MG tablet Take 1 tablet (5 mg total) by mouth daily. 11/02/14   [name], MD  aspirin EC 81 MG EC tablet Take 1 tablet (81 mg total) by mouth daily. 09/07/16   Rexene Alberts, MD  buPROPion (WELLBUTRIN XL) 150 MG 24 hr tablet take 1 tablet by mouth daily 07/22/15   [provider]  diphenhydrAMINE (BENADRYL) 25 mg capsule Take 1 capsule (25 mg total) by mouth every 4 (four) hours as needed. 11/13/16   Carole Civil, MD  EPIPEN 2-PAK 0.3 MG/0.3ML SOAJ injection Inject 0.3 mg as directed daily as needed. Allergic reaction 10/08/14   [provider]  HYDROcodone-acetaminophen (NORCO) 5-325 MG tablet Take 1 tablet every 6 (six) hours as needed by mouth for moderate pain. 12/25/16   Carole Civil, MD  ibuprofen (ADVIL,MOTRIN) 800 MG tablet Take 1 tablet (800 mg total) every 8 (eight) hours as needed by mouth. 12/25/16   Carole Civil, MD  losartan (COZAAR) 25 MG tablet Take 1 tablet (25 mg total) by mouth daily. For treatment of high blood pressure and for your heart. 09/07/16   Rexene Alberts, MD  nitroGLYCERIN (NITRODUR - DOSED IN MG/24 HR) 0.2 mg/hr patch Place 0.2 mg onto the skin daily.    [provider]  nitroGLYCERIN (NITROSTAT) 0.4 MG SL tablet Place  0.4 mg under the tongue every 5 (five) minutes as needed for chest pain (3 doses MAX).    [provider]  ondansetron (ZOFRAN ODT) 4 MG disintegrating tablet 4mg  ODT q4 hours prn nausea/vomit Patient not taking: Reported on 11/13/2016 02/21/15   Elnora Morrison, MD  pantoprazole (PROTONIX) 40 MG tablet Take 1 tablet (40 mg total) by mouth daily. Patient not taking: Reported on 11/13/2016 11/04/13   Kathie Dike, MD  rosuvastatin (CRESTOR) 20 MG tablet TK 1 T PO D 07/22/15   [provider]  traMADol (ULTRAM) 50 MG tablet TAKE 1 TABLET BY MOUTH EVERY 6 HOURS AS NEEDED FOR PAIN. 02/15/15   [provider]    Physical Exam: Vitals:   05/01/17 0440 05/01/17 0445 05/01/17 0500 05/01/17 0600  BP:  (!) 141/81 140/86 138/77  Pulse:  65 62 64  Resp:  20 18 (!) 25  Temp: 98.1 F (36.7 C)     TempSrc: Oral     SpO2:  98% 97% 98%  Weight: 77.8 kg (171 lb 8.3 oz)     Height: 6' (1.829 m)       Constitutional: NAD, calm, comfortable Eyes: PERRL, lids and conjunctivae normal ENMT: Mucous membranes are moist. Posterior pharynx clear of any exudate or lesions. Neck: normal, supple, no masses, no thyromegaly Respiratory: clear to auscultation bilaterally, no wheezing, no crackles. Normal respiratory effort. No accessory muscle use.  Cardiovascular: Regular rate and rhythm, no murmurs / rubs / gallops. No extremity edema.  Diminished pedal pulses bilaterally. No carotid bruits.  Abdomen: Soft, no tenderness, no masses palpated. No hepatosplenomegaly. Bowel sounds positive.  Musculoskeletal: Positive clubbing of fingernails,no cyanosis. Good ROM, no contractures. Normal muscle tone.  Skin: no rashes, lesions, ulcers on limited dermatological examination. Neurologic: CN 2-12 grossly intact. Sensation intact, DTR normal. Strength 5/5 in all 4.  Psychiatric: Normal judgment and insight. Alert and oriented x 4. Normal mood.    Labs on Admission: I have personally reviewed following  labs and imaging studies  CBC: Recent Labs  Lab 04/30/17 2259  WBC 8.5  HGB 14.6  HCT 40.9  MCV 87.0  PLT 562   Basic Metabolic Panel: Recent Labs  Lab 04/30/17 2259 05/01/17 0228  NA 138  --   K 3.3*  --   CL 98*  --   CO2 26  --   GLUCOSE 108*  --   BUN 19  --   CREATININE 1.03  --   CALCIUM 9.7  --   MG  --  1.7  PHOS  --  4.2   GFR: Estimated Creatinine Clearance: 84.8 mL/min (by C-G formula based on SCr of 1.03 mg/dL). Liver Function Tests: No results for input(s): AST, ALT, ALKPHOS, BILITOT, PROT, ALBUMIN in the last 168 hours. No results for input(s): LIPASE, AMYLASE in the last 168 hours. No results for input(s): AMMONIA in the last 168 hours. Coagulation Profile: Recent Labs  Lab 04/30/17 2259  INR 0.86   Cardiac Enzymes: Recent Labs  Lab 04/30/17 2259 05/01/17 0228  TROPONINI 0.05* 0.05*   BNP (last 3 results) No results for input(s): PROBNP in the last 8760 hours. HbA1C: No results for input(s): HGBA1C in the last 72 hours. CBG: No results for input(s): GLUCAP in the last 168 hours. Lipid Profile: No results for input(s): CHOL, HDL, LDLCALC, TRIG, CHOLHDL, LDLDIRECT in the last 72 hours. Thyroid Function Tests: No results for input(s): TSH, T4TOTAL, FREET4, T3FREE, THYROIDAB in the last 72 hours. Anemia Panel: No results for input(s): VITAMINB12, FOLATE, FERRITIN, TIBC, IRON, RETICCTPCT in the last 72 hours. Urine analysis:    Component Value Date/Time   COLORURINE YELLOW 02/21/2015 0845   APPEARANCEUR CLEAR 02/21/2015 0845   LABSPEC 1.010 02/21/2015 0845   PHURINE 6.0 02/21/2015 0845   GLUCOSEU NEGATIVE 02/21/2015 0845   HGBUR MODERATE (A) 02/21/2015 0845   BILIRUBINUR NEGATIVE 02/21/2015 0845   KETONESUR NEGATIVE 02/21/2015 0845   PROTEINUR NEGATIVE 02/21/2015 0845   UROBILINOGEN 0.2 12/16/2013 2148   NITRITE NEGATIVE 02/21/2015 0845   LEUKOCYTESUR NEGATIVE 02/21/2015 0845    Radiological Exams on Admission: Dg Chest 2  View  Result Date: 04/30/2017 CLINICAL DATA:  Chest pain.  Cough and shortness of breath. EXAM: CHEST - 2 VIEW COMPARISON:  Radiograph 09/05/2016.  CT 11/11/2016 FINDINGS: The cardiomediastinal contours are normal. The lungs are clear. Pulmonary vasculature is normal.  No consolidation, pleural effusion, or pneumothorax. No acute osseous abnormalities are seen. IMPRESSION: No acute pulmonary process. Electronically Signed   By: Jeb Levering [name]   On: 04/30/2017 23:30    EKG: Independently reviewed. Vent. rate 81 BPM PR interval * ms QRS duration 83 ms QT/QTc 390/453 ms P-R-T axes 76 43 76 Sinus rhythm Baseline wander in lead(s) V2 V3 V4  Assessment/Plan Principal Problem:   Unstable angina (HCC)   Coronary atherosclerosis of native coronary artery Observation/stepdown. Continue heparin infusion. Trend troponin levels. Follow-up EKG. Check echocardiogram. Cardiology counseling a.m.  Active Problems:   Mixed hyperlipidemia Has not taken starting in a while.    Essential hypertension, benign Currently not taking antihypertensives. Monitor blood pressure.    Tobacco use Declined nicotine replacement therapy. He stated that he is very determined to accomplish this on his own. Staff to provide smoking cessation information.    PAD (peripheral artery disease) (HCC)   Claudication of both lower extremities (HCC) Check arterial Doppler US of lower extremities. Smoking cessation advised. Should resume at least, aspirin and statin.    COPD (chronic obstructive pulmonary disease) ([place]) Advised to cease smoking. Continue supplemental oxygen Bronchodilators as needed.     DVT prophylaxis: On heparin infusion. Code Status: Full code. Family Communication: None at bedside. Disposition Plan: Observation for troponin level trending and cardiology consult. Consults called: Routine cardiology consult. Admission status: Observation/stepdown.   Reubin Milan MD [facility] Pager [phone]  If 7PM-7AM, please contact night-coverage [email or web address] Password [facility]  05/01/2017, 6:46 AM

## 2017-05-01 NOTE — Consult Note (Addendum)
Cardiology Consult    Patient ID: [name]; [id number]; [birth year]   Admit date: 04/30/2017 Date of Consult: 05/01/2017  Primary Care Provider: Celene Squibb, MD Primary Cardiologist: Kathlyn Sacramento, MD    Patient Profile    Joshua Hall is a 62 y.o. male with past medical history of CAD (cath in 2013 showing nonobstructive CAD along LAD and RCA with 99% stenosis along D2 - too small for PCI), PAD (s/p L SFA stent placement in 2015), HTN, HLD, and tobacco use who is being seen today for the evaluation of chest pain at the request of Dr. Olevia Bowens.   History of Present Illness    [name] was most recently admitted to [facility] in 08/2016 for evaluation of chest pain which occurred while driving to work. Cyclic troponin values remained flat at 0.03 that admission with EKG showing no acute ischemic changes. A NST was performed which showed a small, mild intensity, partially reversible mid anteroseptal defect suggesting a small ischemic territory and moderate fixed inferior defect most consistent with attenuation artifact, overall being a low-risk study. He was discharged home and informed to follow-up with Dr. Fletcher Anon as an outpatient.   He presented back to [facility] ED on 04/30/2017 for evaluation of chest pain. He reports having episodes of chest pain occurring for the past two weeks. 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 Reports he would develop a pressure along his left pectoral region and sternum when walking and this would resolve with rest. Never required the use of SL NTG at home. His pain was most notable this past weekend when he was walking up a hill, again resolving with rest. Over the past 2-3 days, he has started to experience pain at rest as well which can last for a few minutes at a time and spontaneously resolves. Notes associated dyspnea and diaphoresis. No recent orthopnea, PND, or lower extremity edema. Has been experiencing  worsening claudications symptoms over the past 5-6 months. Still smokes 0.5 ppd.   Initial labs show WBC 8.5, Hgb 14.6, platelets 272, Na+ 138, K+ 3.3, and creatinine 1.03. Initial troponin 0.05 with repeat value of 0.05. CXR with no acute pulmonary process. EKG shows NSR, HR 81, with no acute ST or T-wave changes when compared to prior tracings.  He denies any pain at this current time. Has been started on Heparin.     Past Medical History:  Diagnosis Date  . Colitis   . COPD (chronic obstructive pulmonary disease) ([place])   . Coronary atherosclerosis of native coronary artery    a. cath in 2013 showing nonobstructive disease along the LAD and RCA with 99% stenosis of D2 --> too small for intervention, medical therapy recommended b. normal NST in 10/2013  . Hyperlipidemia   . Hypertension   . PAD (peripheral artery disease) ([place])    a. s/p stent placement to the left SFA in 2015  . Tobacco use     Past Surgical History:  Procedure Laterality Date  . ABDOMINAL AORTAGRAM N/A 12/23/2013   Procedure: ABDOMINAL Maxcine Ham;  Surgeon: [name], MD;  Location: [place] CATH LAB;  Service: Cardiovascular;  Laterality: N/A;  . CARDIAC CATHETERIZATION  10/2011   [place]  . COLONOSCOPY N/A 03/16/2015   SLF: 1. No source for abdominal pain identified. 2. six colorectal polyps removed. 3. the colon was redundant 4. mild diverticulosis noted in the sigmoid colon 5. small internal hemorroids.   [name]  ESOPHAGOGASTRODUODENOSCOPY N/A 03/16/2015   SLF: 1. No source for abdominal pain identified. 2. mild non-erosive gastritis and duodentitis.   . INSERTION OF MESH N/A 04/13/2015   Procedure: INSERTION OF MESH;  Surgeon: [name], MD;  Location: [place];  Service: General;  Laterality: N/A;  . LEFT HEART CATHETERIZATION WITH CORONARY ANGIOGRAM N/A 10/19/2011   Procedure: LEFT HEART CATHETERIZATION WITH CORONARY ANGIOGRAM;  Surgeon: Machi Whittaker M Martinique, MD;  Location: [facility] CATH LAB;   Service: Cardiovascular;  Laterality: N/A;  . MOUTH SURGERY    . UMBILICAL HERNIA REPAIR N/A 04/13/2015   Procedure:  UMBILICAL HERNIA REPAIR;  Surgeon: [name], MD;  Location: [place];  Service: General;  Laterality: N/A;     Home Medications:  Prior to Admission medications   Medication Sig Start Date End Date Taking? Authorizing Provider  albuterol (PROVENTIL HFA;VENTOLIN HFA) 108 (90 BASE) MCG/ACT inhaler Inhale 2 puffs into the lungs every 6 (six) hours as needed for wheezing or shortness of breath. 01/21/12   Kathie Dike, MD  amLODipine (NORVASC) 5 MG tablet Take 1 tablet (5 mg total) by mouth daily. 11/02/14   [name], MD  aspirin EC 81 MG EC tablet Take 1 tablet (81 mg total) by mouth daily. 09/07/16   Rexene Alberts, MD  buPROPion (WELLBUTRIN XL) 150 MG 24 hr tablet take 1 tablet by mouth daily 07/22/15   [provider]  diphenhydrAMINE (BENADRYL) 25 mg capsule Take 1 capsule (25 mg total) by mouth every 4 (four) hours as needed. 11/13/16   Carole Civil, MD  EPIPEN 2-PAK 0.3 MG/0.3ML SOAJ injection Inject 0.3 mg as directed daily as needed. Allergic reaction 10/08/14   [provider]  HYDROcodone-acetaminophen (NORCO) 5-325 MG tablet Take 1 tablet every 6 (six) hours as needed by mouth for moderate pain. 12/25/16   Carole Civil, MD  ibuprofen (ADVIL,MOTRIN) 800 MG tablet Take 1 tablet (800 mg total) every 8 (eight) hours as needed by mouth. 12/25/16   Carole Civil, MD  losartan (COZAAR) 25 MG tablet Take 1 tablet (25 mg total) by mouth daily. For treatment of high blood pressure and for your heart. 09/07/16   Rexene Alberts, MD  nitroGLYCERIN (NITRODUR - DOSED IN MG/24 HR) 0.2 mg/hr patch Place 0.2 mg onto the skin daily.    [provider]  nitroGLYCERIN (NITROSTAT) 0.4 MG SL tablet Place 0.4 mg under the tongue every 5 (five) minutes as needed for chest pain (3 doses MAX).    [provider]    ondansetron (ZOFRAN ODT) 4 MG disintegrating tablet 4mg  ODT q4 hours prn nausea/vomit Patient not taking: Reported on 11/13/2016 02/21/15   Elnora Morrison, MD  pantoprazole (PROTONIX) 40 MG tablet Take 1 tablet (40 mg total) by mouth daily. Patient not taking: Reported on 11/13/2016 11/04/13   Kathie Dike, MD  rosuvastatin (CRESTOR) 20 MG tablet TK 1 T PO D 07/22/15   [provider]  traMADol (ULTRAM) 50 MG tablet TAKE 1 TABLET BY MOUTH EVERY 6 HOURS AS NEEDED FOR PAIN. 02/15/15   [provider]    Inpatient Medications: Scheduled Meds: . amLODipine  5 mg Oral Daily  . aspirin  81 mg Oral Daily  . buPROPion  150 mg Oral Daily  . rosuvastatin  20 mg Oral q1800   Continuous Infusions: . heparin 1,000 Units/hr (05/01/17 0204)   PRN Meds: acetaminophen **OR** acetaminophen, acetaminophen, nitroGLYCERIN, ondansetron **OR** ondansetron (ZOFRAN) IV  Allergies:    Allergies  Allergen Reactions  . Bee Venom Swelling    Social History:   Social History   Socioeconomic History  . Marital status: Divorced    Spouse name: Not on file  . Number of children: Not on file  . Years of education: Not on file  . Highest education level: Not on file  Social Needs  . Financial resource strain: Not on file  . Food insecurity - worry: Not on file  . Food insecurity - inability: Not on file  . Transportation needs - medical: Not on file  . Transportation needs - non-medical: Not on file  Occupational History  . Not on file  Tobacco Use  . Smoking status: Current Every Day Smoker    Packs/day: 0.50    Years: 40.00    Pack years: 20.00    Types: Cigarettes  . Smokeless tobacco: Never Used  . Tobacco comment: smokes 3 ciggerettes daily for the past 2 months  Substance and Sexual Activity  . Alcohol use: Yes    Alcohol/week: 0.0 oz    Comment: Occasional  . Drug use: No  . Sexual activity: Yes  Other Topics Concern  . Not on file  Social History Narrative  . Not on  file     Family History:    Family History  Problem Relation Age of Onset  . Diabetes Mother   . Diabetes Father   . Stroke Father   . Colon cancer Maternal Uncle   . Heart disease Brother        Died of MI at 5      Review of Systems    General:  No chills, fever, night sweats or weight changes.  Cardiovascular:  No edema, orthopnea, palpitations, paroxysmal nocturnal dyspnea. Positive for chest pain and dyspnea on exertion.  Dermatological: No rash, lesions/masses Respiratory: No cough, dyspnea Urologic: No hematuria, dysuria Abdominal:   No nausea, vomiting, diarrhea, bright red blood per rectum, melena, or hematemesis Neurologic:  No visual changes, wkns, changes in mental status. All other systems reviewed and are otherwise negative except as noted above.  Physical Exam/Data    Vitals:   05/01/17 0445 05/01/17 0500 05/01/17 0600 05/01/17 0736  BP: (!) 141/81 140/86 138/77   Pulse: 65 62 64 71  Resp: 20 18 (!) 25 (!) 0  Temp:    98.2 F (36.8 C)  TempSrc:    Oral  SpO2: 98% 97% 98% 96%  Weight:      Height:        Intake/Output Summary (Last 24 hours) at 05/01/2017 0756 Last data filed at 05/01/2017 0600 Gross per 24 hour  Intake 89.34 ml  Output -  Net 89.34 ml   Filed Weights   04/30/17 2257 05/01/17 0440  Weight: 185 lb (83.9 kg) 171 lb 8.3 oz (77.8 kg)   Body mass index is 23.26 kg/m.   General: Pleasant African American male appearing in NAD Psych: Normal affect. Neuro: Alert and oriented X 3. Moves all extremities spontaneously. HEENT: Normal  Neck: Supple without bruits or JVD. Lungs:  Resp regular and unlabored, CTA without wheezing or rales. Heart: RRR no s3, s4, or murmurs. Abdomen: Soft, non-tender, non-distended, BS + x 4.  Extremities: No clubbing, cyanosis or edema. Distal pulses diminished, especially along left lower extremity.   EKG:  The EKG was personally reviewed and demonstrates: NSR, HR 81, with no acute ST or T-wave changes  when compared to prior tracings.   Labs/Studies     Relevant CV  Studies:  Cardiac Catheterization: 10/2011 Coronary angiography: Coronary dominance: right  Left mainstem: Normal.  Left anterior descending (LAD): There is 30% stenosis in the proximal and mid LAD. The second diagonal is a very small branch. It is only 1 mm in diameter. It has a 99% stenosis proximally.  There is a large ramus intermediate branch which is normal.  Left circumflex (LCx): Normal.  Right coronary artery (RCA): Mild nonobstructive disease in the mid and distal vessel up to 10-20%.  Left ventriculography: Left ventricular systolic function is normal, LVEF is estimated at 55-65%, there is no significant mitral regurgitation   Final Conclusions:   1. Single vessel obstructive coronary disease involving a very tiny diagonal branch. Otherwise nonobstructive disease. The diagonal branch is too small for intervention.  2. Normal left ventricular function.  Recommendations: Aggressive medical therapy and risk factor modification. Anticipate discharge in the morning if no further chest pain.   NST: 08/2016  No diagnostic ST segment changes to indicate ischemia.  Small, mild intensity, partially reversible mid anteroseptal defect suggesting a small ischemic territory.  Moderate sized, moderate intensity, fixed inferior defect most consistent with attenuation artifact rather than scar in light of normal wall motion in this region.  This is a low risk study.  Nuclear stress EF: 51%.  Laboratory Data:  Chemistry Recent Labs  Lab 04/30/17 2259  NA 138  K 3.3*  CL 98*  CO2 26  GLUCOSE 108*  BUN 19  CREATININE 1.03  CALCIUM 9.7  GFRNONAA >60  GFRAA >60  ANIONGAP 14    No results for input(s): PROT, ALBUMIN, AST, ALT, ALKPHOS, BILITOT in the last 168 hours. Hematology Recent Labs  Lab 04/30/17 2259  WBC 8.5  RBC 4.70  HGB 14.6  HCT 40.9  MCV 87.0  MCH 31.1  MCHC 35.7  RDW  14.0  PLT 272   Cardiac Enzymes Recent Labs  Lab 04/30/17 2259 05/01/17 0228  TROPONINI 0.05* 0.05*    Recent Labs  Lab 04/30/17 2302  TROPIPOC 0.05    BNPNo results for input(s): BNP, PROBNP in the last 168 hours.  DDimer No results for input(s): DDIMER in the last 168 hours.  Radiology/Studies:  Dg Chest 2 View  Result Date: 04/30/2017 CLINICAL DATA:  Chest pain.  Cough and shortness of breath. EXAM: CHEST - 2 VIEW COMPARISON:  Radiograph 09/05/2016.  CT 11/11/2016 FINDINGS: The cardiomediastinal contours are normal. The lungs are clear. Pulmonary vasculature is normal. No consolidation, pleural effusion, or pneumothorax. No acute osseous abnormalities are seen. IMPRESSION: No acute pulmonary process. Electronically Signed   By: Jeb Levering [name]   On: 04/30/2017 23:30     Assessment & Plan    1. Accelerating Angina - the patient has a known history of small-vessel CAD with most recent catheterization in 2013 showing nonobstructive CAD along LAD and RCA with 99% stenosis along D2 - too small for PCI. Recent NST in 08/2016 showed a small, mild intensity, partially reversible mid anteroseptal defect suggesting a small ischemic territory and moderate fixed inferior defect most consistent with attenuation artifact with continued medical therapy recommended at that time. - presents with episodes of chest pain occurring with activity over the past few weeks and resolving with rest. Now having episodes of chest pressure at rest, relieved yesterday with SL NTG.  - Initial troponin 0.05 with repeat value of 0.05. EKG shows NSR, HR 81, with no acute ST or T-wave changes when compared to prior tracings. - with his progressive symptoms, would anticipate  a cardiac catheterization for definitive evaluation. The patient understands that risks included but are not limited to stroke (1 in 1000), death (1 in 43), kidney failure [usually temporary] (1 in 500), bleeding (1 in 200), allergic  reaction [possibly serious] (1 in 200).   2. PAD - s/p L SFA stent placement in 2015.  - he does note claudication symptoms over the past several months and distal pulses are diminished along LLE. Repeat dopplers pending.  - will require outpatient follow-up with Dr. Fletcher Anon.   3. HTN - BP well-controlled at 138/77 this AM.  - continue PTA Amlodipine. Will hold Losartan for now in anticipation of catheterization.   4. HLD - FLP in 08/2016 showed total cholesterol of 154, HDL 48, and LDL 74. Will recheck FLP. If not at goal, would recommend titration of Crestor to 40mg  daily.    5. Tobacco Use - continues to smoke 0.5 ppd. Cessation advised.    For questions or updates, please contact [place] Please consult [email or web address] for contact info under Cardiology/STEMI.  Signed, Erma Heritage, PA-C 05/01/2017, 7:56 AM Pager: [phone]  Patient examined chart reviewed no angina on heparin this am. Has had multiple nuclear studies and known CAD with diagonal stenosis SSCP suggestive of angina with ongoing smoking. Exam with no murmur clear lungs PVD with femoral bruit on left and decreased PT. Favor transfer to [facility] for diagnostic cath latter today with Dr Fletcher Anon who is in lab ECG non acute and troponin negative. Risks and options discussed with patient willing to be transferred and have cath. Lab called orders written  Jenkins Rouge

## 2017-05-01 NOTE — ED Notes (Signed)
CRITICAL VALUE ALERT  Critical Value:  Troponin 0.05  Date & Time Notied: 05/01/17 0016  Provider Notified: Christy Gentles EDP   Orders Received/Actions taken: No orders at this time

## 2017-05-01 NOTE — Progress Notes (Signed)
TR BAND REMOVAL  LOCATION:  right radial  DEFLATED PER PROTOCOL:  Yes.    TIME BAND OFF / DRESSING APPLIED:   1845   SITE UPON ARRIVAL:   Level 0  SITE AFTER BAND REMOVAL:  Level 0  CIRCULATION SENSATION AND MOVEMENT:  Within Normal Limits  Yes.    COMMENTS:    

## 2017-05-01 NOTE — Progress Notes (Signed)
r hand neurovascular status intact, pt denies pain or paraesthesia to right hand. 3 cc removed fro TR band-6cc remain.

## 2017-05-01 NOTE — Progress Notes (Signed)
ANTICOAGULATION CONSULT NOTE - Preliminary  Pharmacy Consult for Heparin Indication: ACS/STEMI  Allergies  Allergen Reactions  . Bee Venom Swelling    Patient Measurements: Weight: 185 lb (83.9 kg)     Vital Signs: Temp: 98.7 F (37.1 C) (03/19 2302) Temp Source: Oral (03/19 2302) BP: 119/72 (03/20 0100) Pulse Rate: 91 (03/20 0100)  Labs: Recent Labs    04/30/17 2259  HGB 14.6  HCT 40.9  PLT 272  APTT 30  LABPROT 11.6  INR 0.86  CREATININE 1.03  TROPONINI 0.05*   Estimated Creatinine Clearance: 82.9 mL/min (by C-G formula based on SCr of 1.03 mg/dL).  Medical History: Past Medical History:  Diagnosis Date  . Colitis   . COPD (chronic obstructive pulmonary disease) ([place])   . Coronary atherosclerosis of native coronary artery    a. cath in 2013 showing nonobstructive disease along the LAD and RCA with 99% stenosis of D2 --> too small for intervention, medical therapy recommended b. normal NST in 10/2013  . Hyperlipidemia   . Hypertension   . PAD (peripheral artery disease) ([place])    a. s/p stent placement to the left SFA in 2015  . Tobacco use     Medications:   Assessment: 62 yo male with history of CAD seen in the ED for recurrent exertional chest pain x 1 week. Troponin is elevated. Pharmacy has been consulted for heparin dosing.  Goal of Therapy:  Heparin level goal: 0.3-0.7 units/ml Monitor platelets by anticoagulation protocol: Yes   Plan:  Heparin 4000 unit IV bolus Heparin infusion at 1000 units/ml Heparin level in 6-8 hours  Preliminary review of pertinent patient information completed.  Forestine Na clinical pharmacist will complete review during morning rounds to assess the patient and finalize treatment regimen.  Norberto Sorenson, [place] 05/01/2017,1:55 AM

## 2017-05-01 NOTE — Interval H&P Note (Signed)
Cath Lab Visit (complete for each Cath Lab visit)  Clinical Evaluation Leading to the Procedure:   ACS: Yes.    Non-ACS:  n/a   History and Physical Interval Note:  05/01/2017 3:59 PM  Joshua Hall  has presented today for surgery, with the diagnosis of cp  The various methods of treatment have been discussed with the patient and family. After consideration of risks, benefits and other options for treatment, the patient has consented to  Procedure(s): LEFT HEART CATH AND CORONARY ANGIOGRAPHY (N/A) as a surgical intervention .  The patient's history has been reviewed, patient examined, no change in status, stable for surgery.  I have reviewed the patient's chart and labs.  Questions were answered to the patient's satisfaction.     Kathlyn Sacramento

## 2017-05-01 NOTE — Progress Notes (Signed)
Patient seen and examined. Admitted after midnight secondary to CP and SOB. Patient with heart score of 5 and description of his pain/symptoms very concerning of unstable angina. Patient started on heparin drip. Hemodynamically stable currently and CP free at this time. Seen by cardiology service and will be transfer to [facility] for heart cath and further treatment. He will be transition into cardiology service at this moment. Please refer to H&P written by Dr. Olevia Bowens for further info/details on admission.  Barton Dubois MD [phone]

## 2017-05-01 NOTE — Progress Notes (Signed)
[facility] in to receive patient

## 2017-05-01 NOTE — ED Notes (Signed)
CRITICAL VALUE ALERT  Critical Value:  Troponin 0.05  Date & Time Notied:  05/01/17 at 0324  Provider Notified: Christy Gentles EDP  Orders Received/Actions taken: No orders at this time

## 2017-05-01 NOTE — Progress Notes (Addendum)
ANTICOAGULATION CONSULT NOTE -   Pharmacy Consult for Heparin Indication: ACS/STEMI  Allergies  Allergen Reactions  . Bee Venom Swelling    Patient Measurements: Height: 6' (182.9 cm) Weight: 171 lb 8.3 oz (77.8 kg) IBW/kg (Calculated) : 77.6 HEPARIN DW (KG): 77.8   Vital Signs: Temp: 98.2 F (36.8 C) (03/20 0736) Temp Source: Oral (03/20 0736) BP: 130/77 (03/20 0947) Pulse Rate: 67 (03/20 0800)  Labs: Recent Labs    04/30/17 2259 05/01/17 0228 05/01/17 0813 05/01/17 0814  HGB 14.6  --   --   --   HCT 40.9  --   --   --   PLT 272  --   --   --   APTT 30  --   --   --   LABPROT 11.6  --   --   --   INR 0.86  --   --   --   HEPARINUNFRC  --   --   --  0.30  CREATININE 1.03  --  0.92  --   TROPONINI 0.05* 0.05* 0.04*  --    Estimated Creatinine Clearance: 94.9 mL/min (by C-G formula based on SCr of 0.92 mg/dL).  Medical History: Past Medical History:  Diagnosis Date  . Colitis   . COPD (chronic obstructive pulmonary disease) ([place])   . Coronary atherosclerosis of native coronary artery    a. cath in 2013 showing nonobstructive disease along the LAD and RCA with 99% stenosis of D2 --> too small for intervention, medical therapy recommended b. normal NST in 10/2013  . Hyperlipidemia   . Hypertension   . PAD (peripheral artery disease) ([place])    a. s/p stent placement to the left SFA in 2015  . Tobacco use     Medications:   Assessment: 62 yo male with history of CAD seen in the ED for recurrent exertional chest pain x 1 week. Troponin is slightly elevated. Pharmacy has been consulted for heparin dosing. Heparin level is therapeutic.  Goal of Therapy:  Heparin level goal: 0.3-0.7 units/ml Monitor platelets by anticoagulation protocol: Yes   Plan:  Continue Heparin infusion at 1000 units/ml Check anti-Xa level daily while on heparin Continue to monitor H&H and platelets  Isac Sarna, BS Vena [place], BCPS Clinical Pharmacist Pager  [phone] 05/01/2017,10:42 AM

## 2017-05-01 NOTE — Progress Notes (Signed)
Patient left with [facility]

## 2017-05-01 NOTE — Progress Notes (Signed)
3 cc removed from TR band-3 cc remain, r hand neurovascular status intact. Pt denies pain or paraesthesia

## 2017-05-01 NOTE — H&P (View-Only) (Signed)
Cardiology Consult    Patient ID: Joshua Hall; [id number]; 20-Jan-1956   Admit date: 04/30/2017 Date of Consult: 05/01/2017  Primary Care Provider: Celene Squibb, MD Primary Cardiologist: Kathlyn Sacramento, MD    Patient Profile    Joshua Hall is a 62 y.o. male with past medical history of CAD (cath in 2013 showing nonobstructive CAD along LAD and RCA with 99% stenosis along D2 - too small for PCI), PAD (s/p L SFA stent placement in 2015), HTN, HLD, and tobacco use who is being seen today for the evaluation of chest pain at the request of Dr. Olevia Bowens.   History of Present Illness    Joshua Hall was most recently admitted to [facility] in 08/2016 for evaluation of chest pain which occurred while driving to work. Cyclic troponin values remained flat at 0.03 that admission with EKG showing no acute ischemic changes. A NST was performed which showed a small, mild intensity, partially reversible mid anteroseptal defect suggesting a small ischemic territory and moderate fixed inferior defect most consistent with attenuation artifact, overall being a low-risk study. He was discharged home and informed to follow-up with Dr. Fletcher Anon as an outpatient.   He presented back to [facility] ED on 04/30/2017 for evaluation of chest pain. He reports having episodes of chest pain occurring for the past two weeks. 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 Reports he would develop a pressure along his left pectoral region and sternum when walking and this would resolve with rest. Never required the use of SL NTG at home. His pain was most notable this past weekend when he was walking up a hill, again resolving with rest. Over the past 2-3 days, he has started to experience pain at rest as well which can last for a few minutes at a time and spontaneously resolves. Notes associated dyspnea and diaphoresis. No recent orthopnea, PND, or lower extremity edema. Has been experiencing  worsening claudications symptoms over the past 5-6 months. Still smokes 0.5 ppd.   Initial labs show WBC 8.5, Hgb 14.6, platelets 272, Na+ 138, K+ 3.3, and creatinine 1.03. Initial troponin 0.05 with repeat value of 0.05. CXR with no acute pulmonary process. EKG shows NSR, HR 81, with no acute ST or T-wave changes when compared to prior tracings.  He denies any pain at this current time. Has been started on Heparin.     Past Medical History:  Diagnosis Date  . Colitis   . COPD (chronic obstructive pulmonary disease) ([place])   . Coronary atherosclerosis of native coronary artery    a. cath in 2013 showing nonobstructive disease along the LAD and RCA with 99% stenosis of D2 --> too small for intervention, medical therapy recommended b. normal NST in 10/2013  . Hyperlipidemia   . Hypertension   . PAD (peripheral artery disease) ([place])    a. s/p stent placement to the left SFA in 2015  . Tobacco use     Past Surgical History:  Procedure Laterality Date  . ABDOMINAL AORTAGRAM N/A 12/23/2013   Procedure: ABDOMINAL Maxcine Ham;  Surgeon: [name], MD;  Location: [place] CATH LAB;  Service: Cardiovascular;  Laterality: N/A;  . CARDIAC CATHETERIZATION  10/2011   [place]  . COLONOSCOPY N/A 03/16/2015   SLF: 1. No source for abdominal pain identified. 2. six colorectal polyps removed. 3. the colon was redundant 4. mild diverticulosis noted in the sigmoid colon 5. small internal hemorroids.   [name]  ESOPHAGOGASTRODUODENOSCOPY N/A 03/16/2015   SLF: 1. No source for abdominal pain identified. 2. mild non-erosive gastritis and duodentitis.   . INSERTION OF MESH N/A 04/13/2015   Procedure: INSERTION OF MESH;  Surgeon: [name], MD;  Location: [place];  Service: General;  Laterality: N/A;  . LEFT HEART CATHETERIZATION WITH CORONARY ANGIOGRAM N/A 10/19/2011   Procedure: LEFT HEART CATHETERIZATION WITH CORONARY ANGIOGRAM;  Surgeon: [name], MD;  [facility] CATH LAB;   Service: Cardiovascular;  Laterality: N/A;  . MOUTH SURGERY    . UMBILICAL HERNIA REPAIR N/A 04/13/2015   Procedure:  UMBILICAL HERNIA REPAIR;  Surgeon: [name], MD;  Location: [place];  Service: General;  Laterality: N/A;     Home Medications:  Prior to Admission medications   Medication Sig Start Date End Date Taking? Authorizing Provider  albuterol (PROVENTIL HFA;VENTOLIN HFA) 108 (90 BASE) MCG/ACT inhaler Inhale 2 puffs into the lungs every 6 (six) hours as needed for wheezing or shortness of breath. 01/21/12   Kathie Dike, MD  amLODipine (NORVASC) 5 MG tablet Take 1 tablet (5 mg total) by mouth daily. 11/02/14   [name], MD  aspirin EC 81 MG EC tablet Take 1 tablet (81 mg total) by mouth daily. 09/07/16   Rexene Alberts, MD  buPROPion (WELLBUTRIN XL) 150 MG 24 hr tablet take 1 tablet by mouth daily 07/22/15   [provider]  diphenhydrAMINE (BENADRYL) 25 mg capsule Take 1 capsule (25 mg total) by mouth every 4 (four) hours as needed. 11/13/16   Carole Civil, MD  EPIPEN 2-PAK 0.3 MG/0.3ML SOAJ injection Inject 0.3 mg as directed daily as needed. Allergic reaction 10/08/14   [provider]  HYDROcodone-acetaminophen (NORCO) 5-325 MG tablet Take 1 tablet every 6 (six) hours as needed by mouth for moderate pain. 12/25/16   Carole Civil, MD  ibuprofen (ADVIL,MOTRIN) 800 MG tablet Take 1 tablet (800 mg total) every 8 (eight) hours as needed by mouth. 12/25/16   Carole Civil, MD  losartan (COZAAR) 25 MG tablet Take 1 tablet (25 mg total) by mouth daily. For treatment of high blood pressure and for your heart. 09/07/16   Rexene Alberts, MD  nitroGLYCERIN (NITRODUR - DOSED IN MG/24 HR) 0.2 mg/hr patch Place 0.2 mg onto the skin daily.    [provider]  nitroGLYCERIN (NITROSTAT) 0.4 MG SL tablet Place 0.4 mg under the tongue every 5 (five) minutes as needed for chest pain (3 doses MAX).    [provider]    ondansetron (ZOFRAN ODT) 4 MG disintegrating tablet 4mg  ODT q4 hours prn nausea/vomit Patient not taking: Reported on 11/13/2016 02/21/15   Elnora Morrison, MD  pantoprazole (PROTONIX) 40 MG tablet Take 1 tablet (40 mg total) by mouth daily. Patient not taking: Reported on 11/13/2016 11/04/13   Kathie Dike, MD  rosuvastatin (CRESTOR) 20 MG tablet TK 1 T PO D 07/22/15   [provider]  traMADol (ULTRAM) 50 MG tablet TAKE 1 TABLET BY MOUTH EVERY 6 HOURS AS NEEDED FOR PAIN. 02/15/15   [provider]    Inpatient Medications: Scheduled Meds: . amLODipine  5 mg Oral Daily  . aspirin  81 mg Oral Daily  . buPROPion  150 mg Oral Daily  . rosuvastatin  20 mg Oral q1800   Continuous Infusions: . heparin 1,000 Units/hr (05/01/17 0204)   PRN Meds: acetaminophen **OR** acetaminophen, acetaminophen, nitroGLYCERIN, ondansetron **OR** ondansetron (ZOFRAN) IV  Allergies:    Allergies  Allergen Reactions  . Bee Venom Swelling    Social History:   Social History   Socioeconomic History  . Marital status: Divorced    Spouse name: Not on file  . Number of children: Not on file  . Years of education: Not on file  . Highest education level: Not on file  Social Needs  . Financial resource strain: Not on file  . Food insecurity - worry: Not on file  . Food insecurity - inability: Not on file  . Transportation needs - medical: Not on file  . Transportation needs - non-medical: Not on file  Occupational History  . Not on file  Tobacco Use  . Smoking status: Current Every Day Smoker    Packs/day: 0.50    Years: 40.00    Pack years: 20.00    Types: Cigarettes  . Smokeless tobacco: Never Used  . Tobacco comment: smokes 3 ciggerettes daily for the past 2 months  Substance and Sexual Activity  . Alcohol use: Yes    Alcohol/week: 0.0 oz    Comment: Occasional  . Drug use: No  . Sexual activity: Yes  Other Topics Concern  . Not on file  Social History Narrative  . Not on  file     Family History:    Family History  Problem Relation Age of Onset  . Diabetes Mother   . Diabetes Father   . Stroke Father   . Colon cancer Maternal Uncle   . Heart disease Brother        Died of MI at 6      Review of Systems    General:  No chills, fever, night sweats or weight changes.  Cardiovascular:  No edema, orthopnea, palpitations, paroxysmal nocturnal dyspnea. Positive for chest pain and dyspnea on exertion.  Dermatological: No rash, lesions/masses Respiratory: No cough, dyspnea Urologic: No hematuria, dysuria Abdominal:   No nausea, vomiting, diarrhea, bright red blood per rectum, melena, or hematemesis Neurologic:  No visual changes, wkns, changes in mental status. All other systems reviewed and are otherwise negative except as noted above.  Physical Exam/Data    Vitals:   05/01/17 0445 05/01/17 0500 05/01/17 0600 05/01/17 0736  BP: (!) 141/81 140/86 138/77   Pulse: 65 62 64 71  Resp: 20 18 (!) 25 (!) 0  Temp:    98.2 F (36.8 C)  TempSrc:    Oral  SpO2: 98% 97% 98% 96%  Weight:      Height:        Intake/Output Summary (Last 24 hours) at 05/01/2017 0756 Last data filed at 05/01/2017 0600 Gross per 24 hour  Intake 89.34 ml  Output -  Net 89.34 ml   Filed Weights   04/30/17 2257 05/01/17 0440  Weight: 185 lb (83.9 kg) 171 lb 8.3 oz (77.8 kg)   Body mass index is 23.26 kg/m.   General: Pleasant African American male appearing in NAD Psych: Normal affect. Neuro: Alert and oriented X 3. Moves all extremities spontaneously. HEENT: Normal  Neck: Supple without bruits or JVD. Lungs:  Resp regular and unlabored, CTA without wheezing or rales. Heart: RRR no s3, s4, or murmurs. Abdomen: Soft, non-tender, non-distended, BS + x 4.  Extremities: No clubbing, cyanosis or edema. Distal pulses diminished, especially along left lower extremity.   EKG:  The EKG was personally reviewed and demonstrates: NSR, HR 81, with no acute ST or T-wave changes  when compared to prior tracings.   Labs/Studies     Relevant CV  Studies:  Cardiac Catheterization: 10/2011 Coronary angiography: Coronary dominance: right  Left mainstem: Normal.  Left anterior descending (LAD): There is 30% stenosis in the proximal and mid LAD. The second diagonal is a very small branch. It is only 1 mm in diameter. It has a 99% stenosis proximally.  There is a large ramus intermediate branch which is normal.  Left circumflex (LCx): Normal.  Right coronary artery (RCA): Mild nonobstructive disease in the mid and distal vessel up to 10-20%.  Left ventriculography: Left ventricular systolic function is normal, LVEF is estimated at 55-65%, there is no significant mitral regurgitation   Final Conclusions:   1. Single vessel obstructive coronary disease involving a very tiny diagonal branch. Otherwise nonobstructive disease. The diagonal branch is too small for intervention.  2. Normal left ventricular function.  Recommendations: Aggressive medical therapy and risk factor modification. Anticipate discharge in the morning if no further chest pain.   NST: 08/2016  No diagnostic ST segment changes to indicate ischemia.  Small, mild intensity, partially reversible mid anteroseptal defect suggesting a small ischemic territory.  Moderate sized, moderate intensity, fixed inferior defect most consistent with attenuation artifact rather than scar in light of normal wall motion in this region.  This is a low risk study.  Nuclear stress EF: 51%.  Laboratory Data:  Chemistry Recent Labs  Lab 04/30/17 2259  NA 138  K 3.3*  CL 98*  CO2 26  GLUCOSE 108*  BUN 19  CREATININE 1.03  CALCIUM 9.7  GFRNONAA >60  GFRAA >60  ANIONGAP 14    No results for input(s): PROT, ALBUMIN, AST, ALT, ALKPHOS, BILITOT in the last 168 hours. Hematology Recent Labs  Lab 04/30/17 2259  WBC 8.5  RBC 4.70  HGB 14.6  HCT 40.9  MCV 87.0  MCH 31.1  MCHC 35.7  RDW  14.0  PLT 272   Cardiac Enzymes Recent Labs  Lab 04/30/17 2259 05/01/17 0228  TROPONINI 0.05* 0.05*    Recent Labs  Lab 04/30/17 2302  TROPIPOC 0.05    BNPNo results for input(s): BNP, PROBNP in the last 168 hours.  DDimer No results for input(s): DDIMER in the last 168 hours.  Radiology/Studies:  Dg Chest 2 View  Result Date: 04/30/2017 CLINICAL DATA:  Chest pain.  Cough and shortness of breath. EXAM: CHEST - 2 VIEW COMPARISON:  Radiograph 09/05/2016.  CT 11/11/2016 FINDINGS: The cardiomediastinal contours are normal. The lungs are clear. Pulmonary vasculature is normal. No consolidation, pleural effusion, or pneumothorax. No acute osseous abnormalities are seen. IMPRESSION: No acute pulmonary process. Electronically Signed   By: Jeb Levering [name]   On: 04/30/2017 23:30     Assessment & Plan    1. Accelerating Angina - the patient has a known history of small-vessel CAD with most recent catheterization in 2013 showing nonobstructive CAD along LAD and RCA with 99% stenosis along D2 - too small for PCI. Recent NST in 08/2016 showed a small, mild intensity, partially reversible mid anteroseptal defect suggesting a small ischemic territory and moderate fixed inferior defect most consistent with attenuation artifact with continued medical therapy recommended at that time. - presents with episodes of chest pain occurring with activity over the past few weeks and resolving with rest. Now having episodes of chest pressure at rest, relieved yesterday with SL NTG.  - Initial troponin 0.05 with repeat value of 0.05. EKG shows NSR, HR 81, with no acute ST or T-wave changes when compared to prior tracings. - with his progressive symptoms, would anticipate  a cardiac catheterization for definitive evaluation. The patient understands that risks included but are not limited to stroke (1 in 1000), death (1 in 72), kidney failure [usually temporary] (1 in 500), bleeding (1 in 200), allergic  reaction [possibly serious] (1 in 200).   2. PAD - s/p L SFA stent placement in 2015.  - he does note claudication symptoms over the past several months and distal pulses are diminished along LLE. Repeat dopplers pending.  - will require outpatient follow-up with Dr. Fletcher Anon.   3. HTN - BP well-controlled at 138/77 this AM.  - continue PTA Amlodipine. Will hold Losartan for now in anticipation of catheterization.   4. HLD - FLP in 08/2016 showed total cholesterol of 154, HDL 48, and LDL 74. Will recheck FLP. If not at goal, would recommend titration of Crestor to 40mg  daily.    5. Tobacco Use - continues to smoke 0.5 ppd. Cessation advised.    For questions or updates, please contact [place] Please consult [email or web address] for contact info under Cardiology/STEMI.  Signed, Erma Heritage, PA-C 05/01/2017, 7:56 AM Pager: [phone]  Patient examined chart reviewed no angina on heparin this am. Has had multiple nuclear studies and known CAD with diagonal stenosis SSCP suggestive of angina with ongoing smoking. Exam with no murmur clear lungs PVD with femoral bruit on left and decreased PT. Favor transfer to [facility] for diagnostic cath latter today with Dr Fletcher Anon who is in lab ECG non acute and troponin negative. Risks and options discussed with patient willing to be transferred and have cath. Lab called orders written  Jenkins Rouge

## 2017-05-01 NOTE — Progress Notes (Signed)
Report given to Linward Foster at [facility] Cath lab.

## 2017-05-02 ENCOUNTER — Encounter (HOSPITAL_COMMUNITY): Payer: Self-pay | Admitting: Cardiovascular Disease

## 2017-05-02 DIAGNOSIS — I10 Essential (primary) hypertension: Secondary | ICD-10-CM

## 2017-05-02 LAB — CBC
HCT: 36.6 % — ABNORMAL LOW (ref 39.0–52.0)
Hemoglobin: 13.2 g/dL (ref 13.0–17.0)
MCH: 31.1 pg (ref 26.0–34.0)
MCHC: 36.1 g/dL — ABNORMAL HIGH (ref 30.0–36.0)
MCV: 86.1 fL (ref 78.0–100.0)
PLATELETS: 243 10*3/uL (ref 150–400)
RBC: 4.25 MIL/uL (ref 4.22–5.81)
RDW: 14.1 % (ref 11.5–15.5)
WBC: 4.7 10*3/uL (ref 4.0–10.5)

## 2017-05-02 LAB — LIPID PANEL
Cholesterol: 161 mg/dL (ref 0–200)
HDL: 65 mg/dL (ref >40)
LDL Cholesterol: 79 mg/dL (ref 0–99)
Total CHOL/HDL Ratio: 2.5 RATIO
Triglycerides: 84 mg/dL (ref <150)
VLDL: 17 mg/dL (ref 0–40)

## 2017-05-02 LAB — MRSA PCR SCREENING
MRSA BY PCR: NEGATIVE
MRSA BY PCR: NEGATIVE

## 2017-05-02 MED ORDER — ROSUVASTATIN CALCIUM 20 MG PO TABS: 20.0000 mg | ORAL_TABLET | Freq: Every day | ORAL | 0 refills | Status: DC

## 2017-05-02 MED FILL — Heparin Sodium (Porcine) 2 Unit/ML in Sodium Chloride 0.9%: INTRAMUSCULAR | Qty: 1000 | Status: AC

## 2017-05-02 MED FILL — Lidocaine HCl Local Inj 1%: INTRAMUSCULAR | Qty: 20 | Status: AC

## 2017-05-02 NOTE — Care Management Note (Signed)
Case Management Note  Patient Details  Name: Joshua Hall MRN: [id number] Date of Birth: [birth year]  Subjective/Objective:  Pt presented for Unstable Angina- s/p cath 05-01-17. Plan for home 05-02-17. Pt is without insurance-in the process of getting Medicaid in [place]. Pt uses [facility] [place]. CM did make pt aware of [facility] as well. Pt has PCP Dr. Wende Neighbors [place] .                   Action/Plan: Pt states he has medications at home and will be able to purchase medications until Medicaid is finalized. No further needs from CM @ this time.    Expected Discharge Date:  05/02/17               Expected Discharge Plan:  Home/Self Care  In-House Referral:  NA  Discharge planning Services  CM Consult  Post Acute Care Choice:  NA Choice offered to:  NA  DME Arranged:  N/A DME Agency:  NA  HH Arranged:  NA HH Agency:  NA  Status of Service:  Completed, signed off  If discussed at [place] of Stay Meetings, dates discussed:    Additional Comments:  Bethena Roys, RN 05/02/2017, 11:15 AM

## 2017-05-02 NOTE — Discharge Summary (Signed)
Discharge Summary    Patient ID: Joshua Hall,  MRN: [id number], DOB/AGE: 1955/03/02 62 y.o.  Admit date: 04/30/2017 Discharge date: 05/02/2017  Primary Care Provider: Celene Squibb Primary Cardiologist: Dr. Fletcher Anon  Discharge Diagnoses    Principal Problem:   Unstable angina [facility]) Active Problems:   Mixed hyperlipidemia   Coronary atherosclerosis of native coronary artery   Essential hypertension, benign   Tobacco use   PAD (peripheral artery disease) (HCC)   COPD (chronic obstructive pulmonary disease) (HCC)   Claudication of both lower extremities (HCC)   Allergies Allergies  Allergen Reactions  . Bee Venom Swelling    Diagnostic Studies/Procedures    Cath: 05/01/17  Conclusion     Ost Ramus to Ramus lesion is 30% stenosed.  Prox LAD lesion is 40% stenosed.  Dist LAD lesion is 90% stenosed.  2nd Diag lesion is 100% stenosed.  Mid RCA lesion is 30% stenosed.  The left ventricular systolic function is normal.  LV end diastolic pressure is normal.  The left ventricular ejection fraction is 55-65% by visual estimate.   1.  Significant one-vessel coronary artery disease with 90% stenosis in the distal LAD close to the apex.  Occluded small second diagonal with faint collaterals. 2.  Normal LV systolic function and left ventricular end-diastolic pressure.  Recommendations: Recommend medical therapy for coronary artery disease.  The distal LAD stenosis is too distal vessel diameter is 1.5-2 mm in that area. The patient should follow-up with me for peripheral arterial disease and recurrent left calf claudication.  _____________   History of Present Illness     Joshua Hall was most recently admitted to [facility] in 08/2016 for evaluation of chest pain which occurred while driving to work. Cyclic troponin values remained flat at 0.03 that admission with EKG showing no acute ischemic changes. A NST was performed which showed a small, mild intensity,  partially reversible mid anteroseptal defect suggesting a small ischemic territory and moderate fixed inferior defect most consistent with attenuation artifact, overall being a low-risk study. He was discharged home and informed to follow-up with Dr. Fletcher Anon as an outpatient.   He presented back to [facility] ED on 04/30/2017 for evaluation of chest pain. He reported having episodes of chest pain occurring for the past two weeks. 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 Reported he would develop a pressure along his left pectoral region and sternum when walking and this would resolve with rest. Never required the use of SL NTG at home. His pain was most notable this past weekend when he was walking up a hill, again resolving with rest. Over the past 2-3 days, he had started to experience pain at rest as well which can last for a few minutes at a time and spontaneously resolves. Noted associated dyspnea and diaphoresis. No recent orthopnea, PND, or lower extremity edema. Has been experiencing worsening claudications symptoms over the past 5-6 months. Still smokes 0.5 ppd.   Initial labs show WBC 8.5, Hgb 14.6, platelets 272, Na+ 138, K+ 3.3, and creatinine 1.03. Initial troponin 0.05 with repeat value of 0.05. CXR with no acute pulmonary process. EKG shows NSR, HR 81, with no acute ST or T-wave changes when compared to prior tracings.  He denies any pain at this current time. Has been started on Heparin. Given his symptoms he was transferred to [facility] for cardiac cath.    Hospital Course     Underwent cardiac cath noted  above with significant single vessel disease with 90% stenosis in the dLAD to close the apex. This vessel was 1.5-2 mm in size therefore no PCI was performed. EF noted at 55-65% on LV gram. Plan will be to continue with medical therapy. He was on Crestor 20mg  daily, along with Norvasc and losartan prior to admission. Discussed the need for smoking cessation  as well. Will have him follow up with Dr. Fletcher Anon post cath and for further management of his PAD as he was having symptoms prior to admission.   General: Well developed, well nourished, male appearing in no acute distress. Head: Normocephalic, atraumatic.  Neck: Supple without bruits, JVD. Lungs:  Resp regular and unlabored, CTA. Heart: RRR, S1, S2, no S3, S4, or murmur; no rub. Abdomen: Soft, non-tender, non-distended with normoactive bowel sounds. No hepatomegaly. No rebound/guarding. No obvious abdominal masses. Extremities: No clubbing, cyanosis, edema. Distal pedal pulses are 2+ bilaterally. R radial cath site stable without bruising or hematoma Neuro: Alert and oriented X 3. Moves all extremities spontaneously. Psych: Normal affect.  Joshua Hall was seen by Dr. Martinique and determined stable for discharge home. Follow up in the office has been arranged. Medications are listed below.   _____________  Discharge Vitals Blood pressure (!) 142/82, pulse 80, temperature 98.1 F (36.7 C), temperature source Oral, resp. rate 19, height 6' (1.829 m), weight 171 lb 15.3 oz (78 kg), SpO2 97 %.  Filed Weights   04/30/17 2257 05/01/17 0440 05/02/17 0410  Weight: 185 lb (83.9 kg) 171 lb 8.3 oz (77.8 kg) 171 lb 15.3 oz (78 kg)    Labs & Radiologic Studies    CBC Recent Labs    04/30/17 2259 05/02/17 0349  WBC 8.5 4.7  HGB 14.6 13.2  HCT 40.9 36.6*  MCV 87.0 86.1  PLT 272 841   Basic Metabolic Panel Recent Labs    04/30/17 2259 05/01/17 0228 05/01/17 0813  NA 138  --  136  K 3.3*  --  3.6  CL 98*  --  100*  CO2 26  --  25  GLUCOSE 108*  --  110*  BUN 19  --  20  CREATININE 1.03  --  0.92  CALCIUM 9.7  --  8.7*  MG  --  1.7  --   PHOS  --  4.2  --    Liver Function Tests No results for input(s): AST, ALT, ALKPHOS, BILITOT, PROT, ALBUMIN in the last 72 hours. No results for input(s): LIPASE, AMYLASE in the last 72 hours. Cardiac Enzymes Recent Labs    04/30/17 2259  05/01/17 0228 05/01/17 0813  TROPONINI 0.05* 0.05* 0.04*   BNP Invalid input(s): POCBNP D-Dimer No results for input(s): DDIMER in the last 72 hours. Hemoglobin A1C No results for input(s): HGBA1C in the last 72 hours. Fasting Lipid Panel Recent Labs    05/02/17 0349  CHOL 161  HDL 65  LDLCALC 79  TRIG 84  CHOLHDL 2.5   Thyroid Function Tests No results for input(s): TSH, T4TOTAL, T3FREE, THYROIDAB in the last 72 hours.  Invalid input(s): FREET3 _____________  Dg Chest 2 View  Result Date: 04/30/2017 CLINICAL DATA:  Chest pain.  Cough and shortness of breath. EXAM: CHEST - 2 VIEW COMPARISON:  Radiograph 09/05/2016.  CT 11/11/2016 FINDINGS: The cardiomediastinal contours are normal. The lungs are clear. Pulmonary vasculature is normal. No consolidation, pleural effusion, or pneumothorax. No acute osseous abnormalities are seen. IMPRESSION: No acute pulmonary process. Electronically Signed   By: Threasa Beards  Ehinger [name]   On: 04/30/2017 23:30   Disposition   Pt is being discharged home today in good condition.  Follow-up Plans & Appointments    Follow-up Information    [name], MD Follow up on 05/21/2017.   Specialty:  Cardiology Why:  at 10:40am for your follow up appt.  Contact information: [address] [place] [address] [phone]          Discharge Instructions    Call MD for:  redness, tenderness, or signs of infection (pain, swelling, redness, odor or green/yellow discharge around incision site)   Complete by:  As directed    Diet - low sodium heart healthy   Complete by:  As directed    Discharge instructions   Complete by:  As directed    Radial Site Care Refer to this sheet in the next few weeks. These instructions provide you with information on caring for yourself after your procedure. Your caregiver may also give you more specific instructions. Your treatment has been planned according to current medical practices, but  problems sometimes occur. Call your caregiver if you have any problems or questions after your procedure. HOME CARE INSTRUCTIONS You may shower the day after the procedure.Remove the bandage (dressing) and gently wash the site with plain soap and water.Gently pat the site dry.  Do not apply powder or lotion to the site.  Do not submerge the affected site in water for 3 to 5 days.  Inspect the site at least twice daily.  Do not flex or bend the affected arm for 24 hours.  No lifting over 5 pounds (2.3 kg) for 5 days after your procedure.  Do not drive home if you are discharged the same day of the procedure. Have someone else drive you.  You may drive 24 hours after the procedure unless otherwise instructed by your caregiver.  What to expect: Any bruising will usually fade within 1 to 2 weeks.  Blood that collects in the tissue (hematoma) may be painful to the touch. It should usually decrease in size and tenderness within 1 to 2 weeks.  SEEK IMMEDIATE MEDICAL CARE IF: You have unusual pain at the radial site.  You have redness, warmth, swelling, or pain at the radial site.  You have drainage (other than a small amount of blood on the dressing).  You have chills.  You have a fever or persistent symptoms for more than 72 hours.  You have a fever and your symptoms suddenly get worse.  Your arm becomes pale, cool, tingly, or numb.  You have heavy bleeding from the site. Hold pressure on the site.   Increase activity slowly   Complete by:  As directed       Discharge Medications   Allergies as of 05/02/2017      Reactions   Bee Venom Swelling      Medication List    STOP taking these medications   pantoprazole 40 MG tablet Commonly known as:  PROTONIX     TAKE these medications   albuterol 108 (90 Base) MCG/ACT inhaler Commonly known as:  PROVENTIL HFA;VENTOLIN HFA Inhale 2 puffs into the lungs every 6 (six) hours as needed for wheezing or shortness of breath.   amLODipine 5  MG tablet Commonly known as:  NORVASC Take 1 tablet (5 mg total) by mouth daily.   aspirin 81 MG EC tablet Take 1 tablet (81 mg total) by mouth daily.   buPROPion 150 MG 24 hr  tablet Commonly known as:  WELLBUTRIN XL take 1 tablet by mouth daily   diphenhydrAMINE 25 mg capsule Commonly known as:  BENADRYL Take 1 capsule (25 mg total) by mouth every 4 (four) hours as needed.   EPIPEN 2-PAK 0.3 mg/0.3 mL Soaj injection Generic drug:  EPINEPHrine Inject 0.3 mg as directed daily as needed. Allergic reaction   HYDROcodone-acetaminophen 5-325 MG tablet Commonly known as:  NORCO Take 1 tablet every 6 (six) hours as needed by mouth for moderate pain.   ibuprofen 800 MG tablet Commonly known as:  ADVIL,MOTRIN Take 1 tablet (800 mg total) every 8 (eight) hours as needed by mouth.   losartan 25 MG tablet Commonly known as:  COZAAR Take 1 tablet (25 mg total) by mouth daily. For treatment of high blood pressure and for your heart.   nitroGLYCERIN 0.4 MG SL tablet Commonly known as:  NITROSTAT Place 0.4 mg under the tongue every 5 (five) minutes as needed for chest pain (3 doses MAX). What changed:  Another medication with the same name was removed. Continue taking this medication, and follow the directions you see here.   ondansetron 4 MG disintegrating tablet Commonly known as:  ZOFRAN ODT 4mg  ODT q4 hours prn nausea/vomit   rosuvastatin 20 MG tablet Commonly known as:  CRESTOR TK 1 T PO D   traMADol 50 MG tablet Commonly known as:  ULTRAM TAKE 1 TABLET BY MOUTH EVERY 6 HOURS AS NEEDED FOR PAIN.        Outstanding Labs/Studies   N/a   Duration of Discharge Encounter   Greater than 30 minutes including physician time.  Signed, Reino Bellis NP-C 05/02/2017, 10:59 AM

## 2017-05-21 ENCOUNTER — Ambulatory Visit: Payer: Self-pay | Admitting: Cardiovascular Disease

## 2017-06-11 ENCOUNTER — Ambulatory Visit (INDEPENDENT_AMBULATORY_CARE_PROVIDER_SITE_OTHER): Payer: Self-pay | Admitting: Cardiovascular Disease

## 2017-06-11 ENCOUNTER — Encounter: Payer: Self-pay | Admitting: Cardiovascular Disease

## 2017-06-11 VITALS — BP 148/82 | HR 94 | Ht 72.0 in | Wt 179.0 lb

## 2017-06-11 DIAGNOSIS — I739 Peripheral vascular disease, unspecified: Secondary | ICD-10-CM

## 2017-06-11 DIAGNOSIS — Z720 Tobacco use: Secondary | ICD-10-CM

## 2017-06-11 DIAGNOSIS — I10 Essential (primary) hypertension: Secondary | ICD-10-CM

## 2017-06-11 DIAGNOSIS — I25118 Atherosclerotic heart disease of native coronary artery with other forms of angina pectoris: Secondary | ICD-10-CM

## 2017-06-11 DIAGNOSIS — E785 Hyperlipidemia, unspecified: Secondary | ICD-10-CM

## 2017-06-11 MED ORDER — ROSUVASTATIN CALCIUM 20 MG PO TABS: 20.0000 mg | ORAL_TABLET | Freq: Every day | ORAL | 3 refills | Status: DC

## 2017-06-11 MED ORDER — LOSARTAN POTASSIUM 50 MG PO TABS: 50.0000 mg | ORAL_TABLET | Freq: Every day | ORAL | 3 refills | Status: DC

## 2017-06-11 NOTE — Patient Instructions (Signed)
Medication Instructions:  Your physician has recommended you make the following change in your medication:  1) INCREASE Losartan to 50 mg tablet by mouth ONCE daily   Labwork: none  Testing/Procedures: Your physician has requested that you have a lower extremity arterial doppler- During this test, ultrasound is used to evaluate arterial blood flow in the legs. Allow approximately one hour for this exam.    Follow-Up: Your physician recommends that you schedule a follow-up appointment in: 1 month with Dr. Fletcher Anon.   Any Other Special Instructions Will Be Listed Below (If Applicable).     If you need a refill on your cardiac medications before your next appointment, please call your pharmacy.

## 2017-06-11 NOTE — Progress Notes (Signed)
Cardiology Office Note   Date:  06/11/2017   ID:  Joshua Hall, Joshua Hall Mar 10, 1955, MRN [id number]  PCP:  Celene Squibb, MD  Cardiologist:   Kathlyn Sacramento, MD   Chief Complaint  Patient presents with  . Follow-up    having some pain in legs when walking long distances      History of Present Illness: NIL Joshua Hall is a 62 y.o. male who presents for a follow-up visit regarding coronary artery disease and peripheral arterial disease. He has chronic medical conditions that include tobacco use, hypertension and hyperlipidemia.  He is known to have peripheral arterial disease with claudication.  He had angiography in 2015 which showed severe mid to distal left SFA stenosis with evidence of plaque rupture.  I performed successful self-expanding stent placement at that time.   He presented recently with unstable angina.  He underwent a nuclear stress test which showed mild anteroseptal ischemia.  I proceeded with cardiac catheterization which showed 90% distal LAD stenosis close to the apex, occluded second diagonal and no other obstructive disease.  LVEDP was normal and ejection fraction was 55 to 60%.  The distal LAD was too small to stent and I have recommended medical therapy. He reports no further chest pain or shortness of breath.  He is trying to quit smoking and is down to 1 cigarette every few days. He is mostly bothered by severe left calf discomfort with walking which started in February.  This is now happening after walking about 50 feet and he has to stop and rest for 2 to 3 minutes before he can resume.  No rest pain or lower extremity ulceration.  Past Medical History:  Diagnosis Date  . Colitis   . COPD (chronic obstructive pulmonary disease) ([place])   . Coronary atherosclerosis of native coronary artery    a. cath in 2013 showing nonobstructive disease along the LAD and RCA with 99% stenosis of D2 --> too small for intervention, medical therapy recommended b. normal NST in  10/2013  . Hyperlipidemia   . Hypertension   . PAD (peripheral artery disease) ([place])    a. s/p stent placement to the left SFA in 2015  . Tobacco use     Past Surgical History:  Procedure Laterality Date  . ABDOMINAL AORTAGRAM N/A 12/23/2013   Procedure: ABDOMINAL Maxcine Ham;  Surgeon: [name], MD;  Location: [facility] CATH LAB;  Service: Cardiovascular;  Laterality: N/A;  . CARDIAC CATHETERIZATION  10/2011   [place]  . COLONOSCOPY N/A 03/16/2015   SLF: 1. No source for abdominal pain identified. 2. six colorectal polyps removed. 3. the colon was redundant 4. mild diverticulosis noted in the sigmoid colon 5. small internal hemorroids.   . ESOPHAGOGASTRODUODENOSCOPY N/A 03/16/2015   SLF: 1. No source for abdominal pain identified. 2. mild non-erosive gastritis and duodentitis.   . INSERTION OF MESH N/A 04/13/2015   Procedure: INSERTION OF MESH;  Surgeon: [name], MD;  Location: [facility];  Service: General;  Laterality: N/A;  . LEFT HEART CATH AND CORONARY ANGIOGRAPHY N/A 05/01/2017   Procedure: LEFT HEART CATH AND CORONARY ANGIOGRAPHY;  Surgeon: [name], MD;  Location: [facility] CV LAB;  Service: Cardiovascular;  Laterality: N/A;  . LEFT HEART CATHETERIZATION WITH CORONARY ANGIOGRAM N/A 10/19/2011   Procedure: LEFT HEART CATHETERIZATION WITH CORONARY ANGIOGRAM;  Surgeon: [name], MD;  Location: [facility] CATH LAB;  Service: Cardiovascular;  Laterality: N/A;  . MOUTH SURGERY    .  UMBILICAL HERNIA REPAIR N/A 04/13/2015   Procedure:  UMBILICAL HERNIA REPAIR;  Surgeon: [name], MD;  Location: [facility] [facility];  Service: General;  Laterality: N/A;     Current Outpatient Medications  Medication Sig Dispense Refill  . albuterol (PROVENTIL HFA;VENTOLIN HFA) 108 (90 BASE) MCG/ACT inhaler Inhale 2 puffs into the lungs every 6 (six) hours as needed for wheezing or shortness of breath. 1 Inhaler 0  . amLODipine (NORVASC) 5 MG tablet Take 1  tablet (5 mg total) by mouth daily. 90 tablet 3  . aspirin EC 81 MG EC tablet Take 1 tablet (81 mg total) by mouth daily.    [name] buPROPion (WELLBUTRIN XL) 150 MG 24 hr tablet take 1 tablet by mouth daily  5  . diphenhydrAMINE (BENADRYL) 25 mg capsule Take 1 capsule (25 mg total) by mouth every 4 (four) hours as needed. 60 capsule 2  . EPIPEN 2-PAK 0.3 MG/0.3ML SOAJ injection Inject 0.3 mg as directed daily as needed. Allergic reaction  1  . HYDROcodone-acetaminophen (NORCO) 5-325 MG tablet Take 1 tablet every 6 (six) hours as needed by mouth for moderate pain. 30 tablet 0  . ibuprofen (ADVIL,MOTRIN) 800 MG tablet Take 1 tablet (800 mg total) every 8 (eight) hours as needed by mouth. 90 tablet 1  . losartan (COZAAR) 25 MG tablet Take 1 tablet (25 mg total) by mouth daily. For treatment of high blood pressure and for your heart. 30 tablet 6  . nitroGLYCERIN (NITROSTAT) 0.4 MG SL tablet Place 0.4 mg under the tongue every 5 (five) minutes as needed for chest pain (3 doses MAX).    [name] ondansetron (ZOFRAN ODT) 4 MG disintegrating tablet 4mg  ODT q4 hours prn nausea/vomit 10 tablet 0  . rosuvastatin (CRESTOR) 20 MG tablet Take 1 tablet (20 mg total) by mouth daily. 30 tablet 0  . traMADol (ULTRAM) 50 MG tablet TAKE 1 TABLET BY MOUTH EVERY 6 HOURS AS NEEDED FOR PAIN.  0   No current facility-administered medications for this visit.     Allergies:   Bee venom    Social History:  The patient  reports that he has been smoking cigarettes.  He has a 20.00 pack-year smoking history. He has never used smokeless tobacco. He reports that he drinks alcohol. He reports that he does not use drugs.   Family History:  The patient's family history includes Colon cancer in his maternal uncle; Diabetes in his father and mother; Heart disease in his brother; Stroke in his father.    ROS:  Please see the history of present illness.   Otherwise, review of systems are positive for none.   All other systems are reviewed and  negative.    PHYSICAL EXAM: VS:  BP (!) 148/82   Pulse 94   Ht 6' (1.829 m)   Wt 179 lb (81.2 kg)   BMI 24.28 kg/m  , BMI Body mass index is 24.28 kg/m. GEN: Well nourished, well developed, in no acute distress  HEENT: normal  Neck: no JVD, carotid bruits, or masses Cardiac: RRR; no murmurs, rubs, or gallops,no edema  Respiratory:  clear to auscultation bilaterally, normal work of breathing GI: soft, nontender, nondistended, + BS MS: no deformity or atrophy  Skin: warm and dry, no rash Neuro:  Strength and sensation are intact Psych: euthymic mood, full affect Right radial pulses normal with no hematoma. Femoral pulses are normal bilaterally.  Distal pulses are not palpable on the left side.  EKG:  EKG is not  ordered today.   Recent Labs: 05/01/2017: BUN 20; Creatinine, Ser 0.92; Magnesium 1.7; Potassium 3.6; Sodium 136 05/02/2017: Hemoglobin 13.2; Platelets 243    Lipid Panel    Component Value Date/Time   CHOL 161 05/02/2017 0349   TRIG 84 05/02/2017 0349   HDL 65 05/02/2017 0349   CHOLHDL 2.5 05/02/2017 0349   VLDL 17 05/02/2017 0349   LDLCALC 79 05/02/2017 0349      Wt Readings from Last 3 Encounters:  06/11/17 179 lb (81.2 kg)  05/02/17 171 lb 15.3 oz (78 kg)  11/13/16 177 lb (80.3 kg)      No flowsheet data found.    ASSESSMENT AND PLAN:  1.  Coronary artery disease involving native coronary arteries with other forms of angina: Distal LAD stenosis but the vessel is too small in that area.  He also has an occluded small second diagonal.  Recommend medical therapy for both.  No chest pain since most recent hospitalization.  If he develops recurrent symptoms, we can consider adding carvedilol although beta-blockers were associated with erectile dysfunction in the past.  2.  Peripheral arterial disease: Previous left SFA stenting.  Recurrent severe left calf claudication.  I requested lower extremity arterial Doppler and duplex.  3.  Tobacco use : He is  trying to quit smoking and I discussed with him the importance of complete abstinence.  4.  Essential hypertension:  Blood pressure is mildly elevated.  I increase losartan to 50 mg daily  5.  Hyperlipidemia: Continue treatment with rosuvastatin.  Most recent lipid profile showed an LDL of 79 which is close to target.    Disposition:   FU with with other forms of angina: In 1 month  Signed,  Kathlyn Sacramento, MD  06/11/2017 9:18 AM    [place]

## 2017-06-25 ENCOUNTER — Ambulatory Visit (HOSPITAL_COMMUNITY): Payer: Medicaid Other

## 2017-07-01 ENCOUNTER — Ambulatory Visit (HOSPITAL_COMMUNITY): Payer: Medicaid Other

## 2017-07-02 ENCOUNTER — Ambulatory Visit (INDEPENDENT_AMBULATORY_CARE_PROVIDER_SITE_OTHER): Payer: Self-pay | Admitting: Cardiovascular Disease

## 2017-07-02 ENCOUNTER — Encounter: Payer: Self-pay | Admitting: Cardiovascular Disease

## 2017-07-02 VITALS — BP 152/95 | HR 78 | Ht 72.0 in | Wt 179.6 lb

## 2017-07-02 DIAGNOSIS — I739 Peripheral vascular disease, unspecified: Secondary | ICD-10-CM

## 2017-07-02 NOTE — Progress Notes (Signed)
The patient is supposed to see me after his lower extremity vascular studies.  However, the studies were rescheduled from yesterday to the 24th. No need to see the patient today.  He reports no new symptoms. This visit will be canceled with no charge.  We will call the patient after his vascular studies to discuss findings.

## 2017-07-05 ENCOUNTER — Ambulatory Visit (HOSPITAL_COMMUNITY)
Admission: RE | Admit: 2017-07-05 | Discharge: 2017-07-05 | Disposition: A | Discharge status: Home / Self Care | Payer: Self-pay | Source: Ambulatory Visit | Attending: Cardiology | Admitting: Cardiology

## 2017-07-05 DIAGNOSIS — F17200 Nicotine dependence, unspecified, uncomplicated: Secondary | ICD-10-CM | POA: Insufficient documentation

## 2017-07-05 DIAGNOSIS — I2510 Atherosclerotic heart disease of native coronary artery without angina pectoris: Secondary | ICD-10-CM | POA: Insufficient documentation

## 2017-07-05 DIAGNOSIS — E785 Hyperlipidemia, unspecified: Secondary | ICD-10-CM | POA: Insufficient documentation

## 2017-07-05 DIAGNOSIS — I739 Peripheral vascular disease, unspecified: Secondary | ICD-10-CM | POA: Insufficient documentation

## 2017-07-05 DIAGNOSIS — I10 Essential (primary) hypertension: Secondary | ICD-10-CM | POA: Insufficient documentation

## 2017-07-16 ENCOUNTER — Ambulatory Visit (INDEPENDENT_AMBULATORY_CARE_PROVIDER_SITE_OTHER): Payer: Self-pay | Admitting: Cardiovascular Disease

## 2017-07-16 ENCOUNTER — Encounter: Payer: Self-pay | Admitting: Cardiovascular Disease

## 2017-07-16 VITALS — BP 148/90 | HR 80 | Ht 72.0 in | Wt 179.0 lb

## 2017-07-16 DIAGNOSIS — E785 Hyperlipidemia, unspecified: Secondary | ICD-10-CM

## 2017-07-16 DIAGNOSIS — I10 Essential (primary) hypertension: Secondary | ICD-10-CM

## 2017-07-16 DIAGNOSIS — Z720 Tobacco use: Secondary | ICD-10-CM

## 2017-07-16 DIAGNOSIS — I25118 Atherosclerotic heart disease of native coronary artery with other forms of angina pectoris: Secondary | ICD-10-CM

## 2017-07-16 DIAGNOSIS — I739 Peripheral vascular disease, unspecified: Secondary | ICD-10-CM

## 2017-07-16 MED ORDER — CILOSTAZOL 100 MG PO TABS: 100.0000 mg | ORAL_TABLET | Freq: Two times a day (BID) | ORAL | 2 refills | Status: DC

## 2017-07-16 NOTE — Progress Notes (Signed)
Cardiology Office Note   Date:  07/16/2017   ID:  Joshua Hall, [name] 07-03-1955, MRN [id number]  PCP:  Celene Squibb, MD  Cardiologist:   Kathlyn Sacramento, MD   No chief complaint on file.     History of Present Illness: Joshua Hall is a 62 y.o. male who presents for a follow-up visit regarding coronary artery disease and peripheral arterial disease. He has chronic medical conditions that include tobacco use, hypertension and hyperlipidemia.  He is known to have peripheral arterial disease with claudication.  He had angiography in 2015 which showed severe mid to distal left SFA stenosis with evidence of plaque rupture.  I performed successful self-expanding stent placement at that time.   He had unstable angina in March of this year. Cardiac catheterization showed 90% distal LAD stenosis close to the apex, occluded second diagonal and no other obstructive disease.  LVEDP was normal and ejection fraction was 55 to 60%.  The distal LAD was too small to stent and I have recommended medical therapy.  He was seen recently for worsening left calf claudication.  He underwent noninvasive vascular studies which showed mildly reduced ABI on the left side with evidence of left SFA occlusion from the proximal to the distal segment.  His symptoms have been stable.  He quit smoking 3 days ago.  He denies any chest pain or shortness of breath.  Past Medical History:  Diagnosis Date  . Colitis   . COPD (chronic obstructive pulmonary disease) ([place])   . Coronary atherosclerosis of native coronary artery    a. cath in 2013 showing nonobstructive disease along the LAD and RCA with 99% stenosis of D2 --> too small for intervention, medical therapy recommended b. normal NST in 10/2013  . Hyperlipidemia   . Hypertension   . PAD (peripheral artery disease) ([place])    a. s/p stent placement to the left SFA in 2015  . Tobacco use     Past Surgical History:  Procedure Laterality Date  . ABDOMINAL  AORTAGRAM N/A 12/23/2013   Procedure: ABDOMINAL Maxcine Ham;  Surgeon: [name], MD;  Location: [place] CATH LAB;  Service: Cardiovascular;  Laterality: N/A;  . CARDIAC CATHETERIZATION  10/2011   [place]  . COLONOSCOPY N/A 03/16/2015   SLF: 1. No source for abdominal pain identified. 2. six colorectal polyps removed. 3. the colon was redundant 4. mild diverticulosis noted in the sigmoid colon 5. small internal hemorroids.   . ESOPHAGOGASTRODUODENOSCOPY N/A 03/16/2015   SLF: 1. No source for abdominal pain identified. 2. mild non-erosive gastritis and duodentitis.   . INSERTION OF MESH N/A 04/13/2015   Procedure: INSERTION OF MESH;  Surgeon: [name], MD;  Location: [place];  Service: General;  Laterality: N/A;  . LEFT HEART CATH AND CORONARY ANGIOGRAPHY N/A 05/01/2017   Procedure: LEFT HEART CATH AND CORONARY ANGIOGRAPHY;  Surgeon: [name], MD;  Location: [place] CV LAB;  Service: Cardiovascular;  Laterality: N/A;  . LEFT HEART CATHETERIZATION WITH CORONARY ANGIOGRAM N/A 10/19/2011   Procedure: LEFT HEART CATHETERIZATION WITH CORONARY ANGIOGRAM;  Surgeon: [name], MD;  Location: [facility] CATH LAB;  Service: Cardiovascular;  Laterality: N/A;  . MOUTH SURGERY    . UMBILICAL HERNIA REPAIR N/A 04/13/2015   Procedure:  UMBILICAL HERNIA REPAIR;  Surgeon: [name], MD;  Location: [place];  Service: General;  Laterality: N/A;     Current Outpatient Medications  Medication Sig Dispense Refill  .  albuterol (PROVENTIL HFA;VENTOLIN HFA) 108 (90 BASE) MCG/ACT inhaler Inhale 2 puffs into the lungs every 6 (six) hours as needed for wheezing or shortness of breath. 1 Inhaler 0  . amLODipine (NORVASC) 5 MG tablet Take 1 tablet (5 mg total) by mouth daily. 90 tablet 3  . aspirin EC 81 MG EC tablet Take 1 tablet (81 mg total) by mouth daily.    [name] buPROPion (WELLBUTRIN XL) 150 MG 24 hr tablet take 1 tablet by mouth daily  5  . diphenhydrAMINE  (BENADRYL) 25 mg capsule Take 1 capsule (25 mg total) by mouth every 4 (four) hours as needed. 60 capsule 2  . EPIPEN 2-PAK 0.3 MG/0.3ML SOAJ injection Inject 0.3 mg as directed daily as needed. Allergic reaction  1  . HYDROcodone-acetaminophen (NORCO) 5-325 MG tablet Take 1 tablet every 6 (six) hours as needed by mouth for moderate pain. 30 tablet 0  . ibuprofen (ADVIL,MOTRIN) 800 MG tablet Take 1 tablet (800 mg total) every 8 (eight) hours as needed by mouth. 90 tablet 1  . losartan (COZAAR) 50 MG tablet Take 1 tablet (50 mg total) by mouth daily. For treatment of high blood pressure and for your heart. 90 tablet 3  . nitroGLYCERIN (NITROSTAT) 0.4 MG SL tablet Place 0.4 mg under the tongue every 5 (five) minutes as needed for chest pain (3 doses MAX).    [name] ondansetron (ZOFRAN ODT) 4 MG disintegrating tablet 4mg  ODT q4 hours prn nausea/vomit 10 tablet 0  . rosuvastatin (CRESTOR) 20 MG tablet Take 1 tablet (20 mg total) by mouth daily. 90 tablet 3  . traMADol (ULTRAM) 50 MG tablet TAKE 1 TABLET BY MOUTH EVERY 6 HOURS AS NEEDED FOR PAIN.  0   No current facility-administered medications for this visit.     Allergies:   Bee venom    Social History:  The patient  reports that he quit smoking 2 days ago. His smoking use included cigarettes. He has a 20.00 pack-year smoking history. He has never used smokeless tobacco. He reports that he drinks alcohol. He reports that he does not use drugs.   Family History:  The patient's family history includes Colon cancer in his maternal uncle; Diabetes in his father and mother; Heart disease in his brother; Stroke in his father.    ROS:  Please see the history of present illness.   Otherwise, review of systems are positive for none.   All other systems are reviewed and negative.    PHYSICAL EXAM: VS:  BP (!) 148/90   Pulse 80   Ht 6' (1.829 m)   Wt 179 lb (81.2 kg)   BMI 24.28 kg/m  , BMI Body mass index is 24.28 kg/m. GEN: Well nourished, well  developed, in no acute distress  HEENT: normal  Neck: no JVD, carotid bruits, or masses Cardiac: RRR; no murmurs, rubs, or gallops,no edema  Respiratory:  clear to auscultation bilaterally, normal work of breathing GI: soft, nontender, nondistended, + BS MS: no deformity or atrophy  Skin: warm and dry, no rash Neuro:  Strength and sensation are intact Psych: euthymic mood, full affect Right radial pulses normal with no hematoma. Femoral pulses are normal bilaterally.  Distal pulses are not palpable on the left side.  EKG:  EKG is not ordered today.   Recent Labs: 05/01/2017: BUN 20; Creatinine, Ser 0.92; Magnesium 1.7; Potassium 3.6; Sodium 136 05/02/2017: Hemoglobin 13.2; Platelets 243    Lipid Panel    Component Value Date/Time   CHOL 161  05/02/2017 0349   TRIG 84 05/02/2017 0349   HDL 65 05/02/2017 0349   CHOLHDL 2.5 05/02/2017 0349   VLDL 17 05/02/2017 0349   LDLCALC 79 05/02/2017 0349      Wt Readings from Last 3 Encounters:  07/16/17 179 lb (81.2 kg)  07/02/17 179 lb 9.6 oz (81.5 kg)  06/11/17 179 lb (81.2 kg)      No flowsheet data found.    ASSESSMENT AND PLAN:  1.  Coronary artery disease involving native coronary arteries with other forms of angina: His symptoms are well controlled at the present time.  Continue medical therapy.  2.  Peripheral arterial disease:   Recurrent severe left calf claudication due to left SFA occlusion with mildly reduced ABI.  I discussed with him management options and recommended an initial medical therapy and a walking program before considering revascularization.  I elected to start him on cilostazol.  Reevaluate symptoms in 3 months and consider angiography if no improvement.  3.  Tobacco use : He quit smoking few days ago.  4.  Essential hypertension: Blood pressure is controlled on current medications.  5.  Hyperlipidemia: Continue treatment with rosuvastatin.  Most recent lipid profile showed an LDL of 79 which is close  to target.    Disposition:   FU with with me In 3 months  Signed,  Kathlyn Sacramento, MD  07/16/2017 11:27 AM    [place]

## 2017-07-16 NOTE — Patient Instructions (Addendum)
Medication Instructions: START Pletal 50 mg (half a tablet) twice daily for two weeks. After the two weeks increase the Pletal to a 100 mg tablet twice daily.  If you need a refill on your cardiac medications before your next appointment, please call your pharmacy.    Follow-Up: Your physician wants you to follow-up in 3 month with Dr. Fletcher Anon.    Thank you for choosing [facility] at [facility]!!

## 2017-07-23 ENCOUNTER — Ambulatory Visit: Payer: Medicaid Other | Admitting: Cardiovascular Disease

## 2017-08-07 ENCOUNTER — Observation Stay (HOSPITAL_COMMUNITY)
Admission: EM | Admit: 2017-08-07 | Discharge: 2017-08-08 | Disposition: A | Discharge status: Home / Self Care | Payer: Medicaid Other | Attending: Family Medicine | Admitting: Family Medicine

## 2017-08-07 ENCOUNTER — Emergency Department (HOSPITAL_COMMUNITY): Payer: Medicaid Other

## 2017-08-07 ENCOUNTER — Other Ambulatory Visit: Payer: Self-pay

## 2017-08-07 ENCOUNTER — Encounter (HOSPITAL_COMMUNITY): Payer: Self-pay | Admitting: Emergency Medicine

## 2017-08-07 DIAGNOSIS — R079 Chest pain, unspecified: Secondary | ICD-10-CM | POA: Diagnosis present

## 2017-08-07 DIAGNOSIS — I200 Unstable angina: Secondary | ICD-10-CM | POA: Diagnosis present

## 2017-08-07 DIAGNOSIS — Z7982 Long term (current) use of aspirin: Secondary | ICD-10-CM | POA: Diagnosis not present

## 2017-08-07 DIAGNOSIS — Z87891 Personal history of nicotine dependence: Secondary | ICD-10-CM | POA: Diagnosis not present

## 2017-08-07 DIAGNOSIS — J449 Chronic obstructive pulmonary disease, unspecified: Secondary | ICD-10-CM | POA: Diagnosis not present

## 2017-08-07 DIAGNOSIS — I2510 Atherosclerotic heart disease of native coronary artery without angina pectoris: Secondary | ICD-10-CM | POA: Diagnosis not present

## 2017-08-07 DIAGNOSIS — I739 Peripheral vascular disease, unspecified: Secondary | ICD-10-CM | POA: Diagnosis present

## 2017-08-07 DIAGNOSIS — Z79899 Other long term (current) drug therapy: Secondary | ICD-10-CM | POA: Diagnosis not present

## 2017-08-07 DIAGNOSIS — I10 Essential (primary) hypertension: Secondary | ICD-10-CM | POA: Diagnosis not present

## 2017-08-07 DIAGNOSIS — E782 Mixed hyperlipidemia: Secondary | ICD-10-CM | POA: Diagnosis not present

## 2017-08-07 DIAGNOSIS — Z720 Tobacco use: Secondary | ICD-10-CM | POA: Diagnosis present

## 2017-08-07 DIAGNOSIS — R072 Precordial pain: Principal | ICD-10-CM | POA: Diagnosis present

## 2017-08-07 LAB — CBC
HCT: 40.7 % (ref 39.0–52.0)
HEMATOCRIT: 39.6 % (ref 39.0–52.0)
HEMOGLOBIN: 14.3 g/dL (ref 13.0–17.0)
Hemoglobin: 14.8 g/dL (ref 13.0–17.0)
MCH: 31.8 pg (ref 26.0–34.0)
MCH: 31.8 pg (ref 26.0–34.0)
MCHC: 36.4 g/dL — AB (ref 30.0–36.0)
MCHC: 36.1 g/dL — ABNORMAL HIGH (ref 30.0–36.0)
MCV: 87.3 fL (ref 78.0–100.0)
MCV: 88.2 fL (ref 78.0–100.0)
PLATELETS: 253 10*3/uL (ref 150–400)
Platelets: 245 10*3/uL (ref 150–400)
RBC: 4.66 MIL/uL (ref 4.22–5.81)
RBC: 4.49 MIL/uL (ref 4.22–5.81)
RDW: 13.7 % (ref 11.5–15.5)
RDW: 13.6 % (ref 11.5–15.5)
WBC: 5.2 10*3/uL (ref 4.0–10.5)
WBC: 6.4 10*3/uL (ref 4.0–10.5)

## 2017-08-07 LAB — BASIC METABOLIC PANEL
Anion gap: 6 (ref 5–15)
BUN: 14 mg/dL (ref 6–20)
CALCIUM: 9.1 mg/dL (ref 8.9–10.3)
CO2: 27 mmol/L (ref 22–32)
CREATININE: 1 mg/dL (ref 0.61–1.24)
Chloride: 106 mmol/L (ref 98–111)
GFR calc non Af Amer: >60 mL/min (ref >60)
GLUCOSE: 115 mg/dL — AB (ref 70–99)
Potassium: 3.9 mmol/L (ref 3.5–5.1)
Sodium: 139 mmol/L (ref 135–145)

## 2017-08-07 LAB — LIPID PANEL
CHOL/HDL RATIO: 3.1 ratio
CHOLESTEROL: 170 mg/dL (ref 0–200)
HDL: 54 mg/dL (ref >40)
LDL Cholesterol: 97 mg/dL (ref 0–99)
TRIGLYCERIDES: 94 mg/dL (ref <150)
VLDL: 19 mg/dL (ref 0–40)

## 2017-08-07 LAB — CREATININE, SERUM
CREATININE: 1.09 mg/dL (ref 0.61–1.24)
GFR calc Af Amer: >60 mL/min (ref >60)
GFR calc non Af Amer: >60 mL/min (ref >60)

## 2017-08-07 LAB — I-STAT TROPONIN, ED: TROPONIN I, POC: 0.02 ng/mL (ref 0.00–0.08)

## 2017-08-07 LAB — TROPONIN I: Troponin I: <0.03 ng/mL (ref <0.03)

## 2017-08-07 LAB — MRSA PCR SCREENING: MRSA by PCR: POSITIVE — AB

## 2017-08-07 MED ORDER — MORPHINE SULFATE (PF) 2 MG/ML IV SOLN: 2.0000 mg | INTRAVENOUS | Status: DC | PRN

## 2017-08-07 MED ORDER — ACETAMINOPHEN 325 MG PO TABS: 650.0000 mg | ORAL_TABLET | ORAL | Status: DC | PRN

## 2017-08-07 MED ORDER — HEPARIN SODIUM (PORCINE) 5000 UNIT/ML IJ SOLN
5000.0000 [IU] | Freq: Three times a day (TID) | INTRAMUSCULAR | Status: DC
  Administered 2017-08-07 (×3): 5000 [IU] via SUBCUTANEOUS
  Filled 2017-08-07 (×4): qty 1

## 2017-08-07 MED ORDER — ROSUVASTATIN CALCIUM 20 MG PO TABS
20.0000 mg | ORAL_TABLET | Freq: Every day | ORAL | Status: DC
  Administered 2017-08-07 – 2017-08-08 (×2): 20 mg via ORAL
  Filled 2017-08-07 (×2): qty 1

## 2017-08-07 MED ORDER — AMLODIPINE BESYLATE 5 MG PO TABS
5.0000 mg | ORAL_TABLET | Freq: Every day | ORAL | Status: DC
  Administered 2017-08-07: 5 mg via ORAL
  Filled 2017-08-07: qty 1

## 2017-08-07 MED ORDER — NITROGLYCERIN 0.4 MG SL SUBL: 0.4000 mg | SUBLINGUAL_TABLET | SUBLINGUAL | Status: DC | PRN

## 2017-08-07 MED ORDER — ASPIRIN EC 81 MG PO TBEC
81.0000 mg | DELAYED_RELEASE_TABLET | Freq: Every day | ORAL | Status: DC
  Administered 2017-08-07 – 2017-08-08 (×2): 81 mg via ORAL
  Filled 2017-08-07 (×2): qty 1

## 2017-08-07 MED ORDER — CILOSTAZOL 100 MG PO TABS
100.0000 mg | ORAL_TABLET | Freq: Two times a day (BID) | ORAL | Status: DC
  Administered 2017-08-07 – 2017-08-08 (×3): 100 mg via ORAL
  Filled 2017-08-07 (×3): qty 1

## 2017-08-07 MED ORDER — ASPIRIN 81 MG PO CHEW
324.0000 mg | CHEWABLE_TABLET | Freq: Once | ORAL | Status: AC
Start: 2017-08-07 — End: 2017-08-07
  Administered 2017-08-07: 324 mg via ORAL
  Filled 2017-08-07: qty 4

## 2017-08-07 MED ORDER — ONDANSETRON HCL 4 MG/2ML IJ SOLN: 4.0000 mg | Freq: Four times a day (QID) | INTRAMUSCULAR | Status: DC | PRN

## 2017-08-07 MED ORDER — AMLODIPINE BESYLATE 5 MG PO TABS
5.0000 mg | ORAL_TABLET | Freq: Once | ORAL | Status: AC
  Administered 2017-08-07: 5 mg via ORAL
  Filled 2017-08-07: qty 1

## 2017-08-07 MED ORDER — GI COCKTAIL ~~LOC~~: 30.0000 mL | Freq: Four times a day (QID) | ORAL | Status: DC | PRN

## 2017-08-07 MED ORDER — AMLODIPINE BESYLATE 5 MG PO TABS
10.0000 mg | ORAL_TABLET | Freq: Every day | ORAL | Status: DC
  Administered 2017-08-08: 10 mg via ORAL
  Filled 2017-08-07: qty 2

## 2017-08-07 MED ORDER — ALBUTEROL SULFATE (2.5 MG/3ML) 0.083% IN NEBU: 3.0000 mL | INHALATION_SOLUTION | Freq: Four times a day (QID) | RESPIRATORY_TRACT | Status: DC | PRN

## 2017-08-07 MED ORDER — LOSARTAN POTASSIUM 50 MG PO TABS
50.0000 mg | ORAL_TABLET | Freq: Every day | ORAL | Status: DC
  Administered 2017-08-07 – 2017-08-08 (×2): 50 mg via ORAL
  Filled 2017-08-07 (×2): qty 1

## 2017-08-07 MED ORDER — HYDROCODONE-ACETAMINOPHEN 5-325 MG PO TABS: 1.0000 | ORAL_TABLET | Freq: Four times a day (QID) | ORAL | Status: DC | PRN

## 2017-08-07 NOTE — H&P (Signed)
History and Physical  Joshua Hall:[id number] DOB: 06/22/1955 DOA: 08/07/2017  Referring physician: Christy Gentles PCP: Celene Squibb, MD   Chief Complaint: chest pain  HPI: Joshua Hall is a 62 y.o. male with known CAD and unstable angina, severe PVD followed by Dr. Fletcher Anon presented to ED with increasing intermittent chest pain for the last 4 days and worsening claudication involving the left leg.  The patient is known to have significant peripheral artery disease with claudication.  He had angiography in 2015 which showed severe mid to distal left SFA stenosis and had a stent placed at that time by Dr. Fletcher Anon.  The patient also had hospitalization for unstable angina in March of this year.  He had a cardiac catheterization at that time showing 90% distal LAD stenosis close to the apex, occluded second diagonal and no other obstructive disease.  He was being treated medically for this.  The patient reports that he can only walk approximately 15 feet without significant severe left leg pain forcing him to stop.  The patient says that the intermittent chest pain presents randomly not associated with activity.  The patient says that he quit smoking approximately 4 days ago.  The patient says that nothing seems to relieve the pain.  The chest pain is exacerbated by ambulating in the sense that he developed shortness of breath while ambulating.  The patient has no radiation of pain but it is in the center of the chest and substernal and very similar to how he presented in March with unstable angina.  The patient says that he was recently seen by Dr. Fletcher Anon and started on Pletal as a treatment for his claudication symptoms however it is not helping his symptoms.  The patient says that he has not noticed any significant changes however the pain seems to worsen with ambulation at this time.  ED course: The patient was evaluated in the emergency department and noted to have a normal troponin.  His EKG was  essentially unchanged from prior testing.  The patient had Doppler studies of the left lower extremity to confirm a weak pulse.  He was noted to have warm extremities.  He was given 4 baby aspirin.  Hospital observation was requested to cycle troponins to rule out acute myocardial ischemia and to evaluate worsening claudication symptoms.  Review of Systems: All systems reviewed and apart from history of presenting illness, are negative.  Past Medical History:  Diagnosis Date  . Colitis   . COPD (chronic obstructive pulmonary disease) ([place])   . Coronary atherosclerosis of native coronary artery    a. cath in 2013 showing nonobstructive disease along the LAD and RCA with 99% stenosis of D2 --> too small for intervention, medical therapy recommended b. normal NST in 10/2013  . Hyperlipidemia   . Hypertension   . PAD (peripheral artery disease) ([place])    a. s/p stent placement to the left SFA in 2015  . Tobacco use    Past Surgical History:  Procedure Laterality Date  . ABDOMINAL AORTAGRAM N/A 12/23/2013   Procedure: ABDOMINAL Maxcine Ham;  Surgeon: [name], MD;  Location: [place] CATH LAB;  Service: Cardiovascular;  Laterality: N/A;  . CARDIAC CATHETERIZATION  10/2011   [place]  . COLONOSCOPY N/A 03/16/2015   SLF: 1. No source for abdominal pain identified. 2. six colorectal polyps removed. 3. the colon was redundant 4. mild diverticulosis noted in the sigmoid colon 5. small internal hemorroids.   . ESOPHAGOGASTRODUODENOSCOPY N/A  03/16/2015   SLF: 1. No source for abdominal pain identified. 2. mild non-erosive gastritis and duodentitis.   . INSERTION OF MESH N/A 04/13/2015   Procedure: INSERTION OF MESH;  Surgeon: [name], MD;  Location: [place];  Service: General;  Laterality: N/A;  . LEFT HEART CATH AND CORONARY ANGIOGRAPHY N/A 05/01/2017   Procedure: LEFT HEART CATH AND CORONARY ANGIOGRAPHY;  Surgeon: [name], MD;  Location: [place] CV LAB;   Service: Cardiovascular;  Laterality: N/A;  . LEFT HEART CATHETERIZATION WITH CORONARY ANGIOGRAM N/A 10/19/2011   Procedure: LEFT HEART CATHETERIZATION WITH CORONARY ANGIOGRAM;  Surgeon: [name], MD;  Location: [facility] CATH LAB;  Service: Cardiovascular;  Laterality: N/A;  . MOUTH SURGERY    . UMBILICAL HERNIA REPAIR N/A 04/13/2015   Procedure:  UMBILICAL HERNIA REPAIR;  Surgeon: [name], MD;  Location: [place];  Service: General;  Laterality: N/A;   Social History:  reports that he quit smoking about 3 weeks ago. His smoking use included cigarettes. He has a 20.00 pack-year smoking history. He has never used smokeless tobacco. He reports that he drinks alcohol. He reports that he does not use drugs.  Allergies  Allergen Reactions  . Bee Venom Swelling    Family History  Problem Relation Age of Onset  . Diabetes Mother   . Diabetes Father   . Stroke Father   . Colon cancer Maternal Uncle   . Heart disease Brother        Died of MI at 59    Prior to Admission medications   Medication Sig Start Date End Date Taking? Authorizing Provider  albuterol (PROVENTIL HFA;VENTOLIN HFA) 108 (90 BASE) MCG/ACT inhaler Inhale 2 puffs into the lungs every 6 (six) hours as needed for wheezing or shortness of breath. 01/21/12   Kathie Dike, MD  amLODipine (NORVASC) 5 MG tablet Take 1 tablet (5 mg total) by mouth daily. 11/02/14   [name], MD  aspirin EC 81 MG EC tablet Take 1 tablet (81 mg total) by mouth daily. 09/07/16   Rexene Alberts, MD  buPROPion (WELLBUTRIN XL) 150 MG 24 hr tablet take 1 tablet by mouth daily 07/22/15   [provider]  cilostazol (PLETAL) 100 MG tablet Take 1 tablet (100 mg total) by mouth 2 (two) times daily. 07/16/17   [name], MD  diphenhydrAMINE (BENADRYL) 25 mg capsule Take 1 capsule (25 mg total) by mouth every 4 (four) hours as needed. 11/13/16   Carole Civil, MD  EPIPEN 2-PAK 0.3 MG/0.3ML SOAJ injection Inject 0.3 mg  as directed daily as needed. Allergic reaction 10/08/14   [provider]  HYDROcodone-acetaminophen (NORCO) 5-325 MG tablet Take 1 tablet every 6 (six) hours as needed by mouth for moderate pain. 12/25/16   Carole Civil, MD  ibuprofen (ADVIL,MOTRIN) 800 MG tablet Take 1 tablet (800 mg total) every 8 (eight) hours as needed by mouth. 12/25/16   Carole Civil, MD  losartan (COZAAR) 50 MG tablet Take 1 tablet (50 mg total) by mouth daily. For treatment of high blood pressure and for your heart. 06/11/17   [name], MD  nitroGLYCERIN (NITROSTAT) 0.4 MG SL tablet Place 0.4 mg under the tongue every 5 (five) minutes as needed for chest pain (3 doses MAX).    [provider]  ondansetron (ZOFRAN ODT) 4 MG disintegrating tablet 4mg  ODT q4 hours prn nausea/vomit 02/21/15   Elnora Morrison, MD  rosuvastatin (CRESTOR) 20 MG  tablet Take 1 tablet (20 mg total) by mouth daily. 06/11/17   [name], MD  traMADol (ULTRAM) 50 MG tablet TAKE 1 TABLET BY MOUTH EVERY 6 HOURS AS NEEDED FOR PAIN. 02/15/15   [provider]   Physical Exam: Vitals:   08/07/17 0549 08/07/17 0551 08/07/17 0650 08/07/17 0700  BP:  (!) 160/93 (!) 155/93 (!) 162/98  Pulse:  63 78 (!) 56  Resp:  18 (!) 21 (!) 24  Temp:  97.6 F (36.4 C)    SpO2:  98% 98% 100%  Weight: 81.6 kg (180 lb)     Height: 6' (1.829 m)        General exam: Moderately built and nourished patient, lying comfortably supine on the gurney in no obvious distress.  Head, eyes and ENT: Nontraumatic and normocephalic. Pupils equally reacting to light and accommodation. Oral mucosa moist.  Neck: Supple. No JVD, carotid bruit or thyromegaly.  Lymphatics: No lymphadenopathy.  Respiratory system: Clear to auscultation. No increased work of breathing.  Cardiovascular system: S1 and S2 heard, RRR. No JVD, murmurs, gallops, clicks or pedal edema.  Gastrointestinal system: Abdomen is nondistended, soft and nontender.  Normal bowel sounds heard. No organomegaly or masses appreciated.  Central nervous system: Alert and oriented. No focal neurological deficits.  Extremities: Symmetric 5 x 5 power. Diminished pedal pulses on left but doppler positive, right pedal pulses palpated. Warm extremities bilateral.   Skin: No rashes or acute findings.  Musculoskeletal system: Negative exam.  Psychiatry: Pleasant and cooperative.  Labs on Admission:  Basic Metabolic Panel: Recent Labs  Lab 08/07/17 0556  NA 139  K 3.9  CL 106  CO2 27  GLUCOSE 115*  BUN 14  CREATININE 1.00  CALCIUM 9.1   Liver Function Tests: No results for input(s): AST, ALT, ALKPHOS, BILITOT, PROT, ALBUMIN in the last 168 hours. No results for input(s): LIPASE, AMYLASE in the last 168 hours. No results for input(s): AMMONIA in the last 168 hours. CBC: Recent Labs  Lab 08/07/17 0556  WBC 5.2  HGB 14.8  HCT 40.7  MCV 87.3  PLT 253   Cardiac Enzymes: No results for input(s): CKTOTAL, CKMB, CKMBINDEX, TROPONINI in the last 168 hours.  BNP (last 3 results) No results for input(s): PROBNP in the last 8760 hours. CBG: No results for input(s): GLUCAP in the last 168 hours.  Radiological Exams on Admission: Dg Chest 2 View  Result Date: 08/07/2017 CLINICAL DATA:  62 y/o M; left-sided chest pain for 3 days on and off. EXAM: CHEST - 2 VIEW COMPARISON:  04/30/2017 chest radiograph. FINDINGS: Stable heart size and mediastinal contours are within normal limits given differences in technique. Both lungs are clear. The visualized skeletal structures are unremarkable. IMPRESSION: No acute pulmonary process. Electronically Signed   By: Kristine Garbe [name]   On: 08/07/2017 06:44   EKG: Independently reviewed. No acute ST-T wave abnormalities.   Assessment/Plan Principal Problem:   Chest pain Active Problems:   Mixed hyperlipidemia   Coronary atherosclerosis of native coronary artery   Essential hypertension, benign    Substernal chest pain   Unstable angina (HCC)   Tobacco use   PAD (peripheral artery disease) (HCC)   COPD (chronic obstructive pulmonary disease) (HCC)   Claudication of both lower extremities (HCC)   Hypertension   1. Recurrent chest pain/unstable angina- admit for observation, cycle troponin, rule out acute myocardial ischemia, monitor on telemetry, oxygen, nitroglycerin, aspirin and morphine ordered as needed for pain.  Given his history  of unstable angina and severe coronary artery disease will ask for cardiology consultation for further management recommendations regarding his unstable angina. 2. Severe peripheral artery disease with claudication of both lower extremities worse on the left, patient reports worsening symptoms in the past several days, he does have Doppler pulse in the left lower extremity however he likely will need to have a vascular surgery follow-up after discharge.  He likely is going to need some kind of vascular intervention.  Continue Pletal for now.  Await for cardiology recommendations. 3. Tobacco abuse-patient reports that he quit smoking 4 days ago.  However, he told Dr. Fletcher Anon during his visit on 6/4 that he had quit 3 days ago so I am not sure exactly if he has totally quit tobacco. 4. COPD- we will provide nebulizers as needed for symptoms. 5. Hypertension-resume home medications. 6. Hyperlipidemia-resume home medications.  DVT Prophylaxis: Lovenox Code Status: Full Family Communication:   Disposition Plan: Pending medical work-up  Time spent: 63 minutes  Irwin Brakeman, MD [facility] Pager [phone]  If 7PM-7AM, please contact night-coverage [email or web address] Password [facility] 08/07/2017, 7:16 AM

## 2017-08-07 NOTE — Consult Note (Signed)
Cardiology Consult    Patient ID: Joshua Hall; [id number]; 05-14-1955   Admit date: 08/07/2017 Date of Consult: 08/07/2017  Primary Care Provider: Celene Squibb, MD Primary Cardiologist: Kathlyn Sacramento, MD   Patient Profile    Joshua Hall is a 62 y.o. male with past medical history of CAD (cath in 2013 showing nonobstructive CAD along LAD and RCA with 99% stenosis along D2 - too small for PCI), PAD (s/p L SFA stent placement in 2015), HTN, HLD, and tobacco use who is being seen today for the evaluation of chest pain at the request of Dr. Wynetta Emery.   History of Present Illness    Joshua Hall was recently admitted to [facility] in 04/2017 for evaluation of chest discomfort occurring over the past few weeks when exerting himself.  Initial and cyclic troponin values remained flat at 0.05 and his EKG showed no acute ischemic changes. Given his initial symptoms being concerning for unstable angina, he was transferred to [facility] for a cardiac catheterization. This was performed on 05/01/2017 and showed significant 1-vessel CAD with 90% stenosis along the distal LAD close to the [place] and occluded 2nd D2 with faint collaterals. The distal LAD was too small for PCI was medical management was recommended.   He has followed up with Dr. Fletcher Anon in the outpatient setting at the time of his recent visit on 07/16/2016, he denied any recent chest discomfort he was continued on medical therapy. He did report recurrent left calf claudication due to known left SFA occlusion and continued medical therapy was recommended for his PAD at that time and he was started on Cilostazol.   He presented to [facility] ED earlier this morning for evaluation of shooting chest discomfort for the past 3 days. In talking with the patient, he reports .   Initial labs showed WBC 5.2, Hgb 14.8, platelets 253, Na+ 139, K+ 3.9, and creatinine 1.00. Initial and delta troponin values have been negative with cyclic values pending.   CXR shows no acute cardiopulmonary findings.  EKG shows NSR, HR 67, with PVC's and no acute ST changes when compared to prior tracings.    Past Medical History:  Diagnosis Date  . Colitis   . COPD (chronic obstructive pulmonary disease) ([place])   . Coronary atherosclerosis of native coronary artery    a. cath in 2013 showing nonobstructive disease along the LAD and RCA with 99% stenosis of D2 --> too small for intervention, medical therapy recommended b. normal NST in 10/2013  . Hyperlipidemia   . Hypertension   . PAD (peripheral artery disease) ([place])    a. s/p stent placement to the left SFA in 2015  . Tobacco use     Past Surgical History:  Procedure Laterality Date  . ABDOMINAL AORTAGRAM N/A 12/23/2013   Procedure: ABDOMINAL Maxcine Ham;  Surgeon: [name], MD;  Location: [place] CATH LAB;  Service: Cardiovascular;  Laterality: N/A;  . CARDIAC CATHETERIZATION  10/2011   [place]  . COLONOSCOPY N/A 03/16/2015   SLF: 1. No source for abdominal pain identified. 2. six colorectal polyps removed. 3. the colon was redundant 4. mild diverticulosis noted in the sigmoid colon 5. small internal hemorroids.   . ESOPHAGOGASTRODUODENOSCOPY N/A 03/16/2015   SLF: 1. No source for abdominal pain identified. 2. mild non-erosive gastritis and duodentitis.   . INSERTION OF MESH N/A 04/13/2015   Procedure: INSERTION OF MESH;  Surgeon: [name], MD;  Location: [place];  Service:  General;  Laterality: N/A;  . LEFT HEART CATH AND CORONARY ANGIOGRAPHY N/A 05/01/2017   Procedure: LEFT HEART CATH AND CORONARY ANGIOGRAPHY;  Surgeon: [name], MD;  Location: [place] CV LAB;  Service: Cardiovascular;  Laterality: N/A;  . LEFT HEART CATHETERIZATION WITH CORONARY ANGIOGRAM N/A 10/19/2011   Procedure: LEFT HEART CATHETERIZATION WITH CORONARY ANGIOGRAM;  Surgeon: [name], MD;  Location: [facility] CATH LAB;  Service: Cardiovascular;  Laterality: N/A;  . MOUTH SURGERY    .  UMBILICAL HERNIA REPAIR N/A 04/13/2015   Procedure:  UMBILICAL HERNIA REPAIR;  Surgeon: [name], MD;  Location: [place];  Service: General;  Laterality: N/A;     Home Medications:  Prior to Admission medications   Medication Sig Start Date End Date Taking? Authorizing Provider  albuterol (PROVENTIL HFA;VENTOLIN HFA) 108 (90 BASE) MCG/ACT inhaler Inhale 2 puffs into the lungs every 6 (six) hours as needed for wheezing or shortness of breath. 01/21/12  Yes Kathie Dike, MD  amLODipine (NORVASC) 5 MG tablet Take 1 tablet (5 mg total) by mouth daily. 11/02/14  Yes [name], MD  aspirin EC 81 MG EC tablet Take 1 tablet (81 mg total) by mouth daily. 09/07/16  Yes Rexene Alberts, MD  buPROPion (WELLBUTRIN XL) 150 MG 24 hr tablet take 1 tablet by mouth daily 07/22/15  Yes [provider]  cilostazol (PLETAL) 100 MG tablet Take 1 tablet (100 mg total) by mouth 2 (two) times daily. 07/16/17  Yes [name], MD  diphenhydrAMINE (BENADRYL) 25 mg capsule Take 1 capsule (25 mg total) by mouth every 4 (four) hours as needed. 11/13/16  Yes Carole Civil, MD  EPIPEN 2-PAK 0.3 MG/0.3ML SOAJ injection Inject 0.3 mg as directed daily as needed. Allergic reaction 10/08/14  Yes [provider]  HYDROcodone-acetaminophen (NORCO) 5-325 MG tablet Take 1 tablet every 6 (six) hours as needed by mouth for moderate pain. 12/25/16  Yes Carole Civil, MD  ibuprofen (ADVIL,MOTRIN) 800 MG tablet Take 1 tablet (800 mg total) every 8 (eight) hours as needed by mouth. 12/25/16  Yes Carole Civil, MD  losartan (COZAAR) 50 MG tablet Take 1 tablet (50 mg total) by mouth daily. For treatment of high blood pressure and for your heart. 06/11/17  Yes [name], MD  nitroGLYCERIN (NITROSTAT) 0.4 MG SL tablet Place 0.4 mg under the tongue every 5 (five) minutes as needed for chest pain (3 doses MAX).   Yes [provider]  ondansetron (ZOFRAN ODT) 4 MG  disintegrating tablet 4mg  ODT q4 hours prn nausea/vomit Patient taking differently: Take 4 mg by mouth. 4mg  ODT every 4 hours as needed for nausea/vomit 02/21/15  Yes Elnora Morrison, MD  rosuvastatin (CRESTOR) 20 MG tablet Take 1 tablet (20 mg total) by mouth daily. 06/11/17  Yes [name], MD  traMADol (ULTRAM) 50 MG tablet TAKE 1 TABLET BY MOUTH EVERY 6 HOURS AS NEEDED FOR PAIN. 02/15/15  Yes [provider]    Inpatient Medications: Scheduled Meds: . amLODipine  5 mg Oral Daily  . aspirin EC  81 mg Oral Daily  . cilostazol  100 mg Oral BID  . heparin  5,000 Units Subcutaneous Q8H  . losartan  50 mg Oral Daily  . rosuvastatin  20 mg Oral Daily   Continuous Infusions:  PRN Meds: acetaminophen, albuterol, gi cocktail, HYDROcodone-acetaminophen, morphine injection, nitroGLYCERIN, ondansetron (ZOFRAN) IV  Allergies:    Allergies  Allergen Reactions  . Bee Venom Swelling  Social History:   Social History   Socioeconomic History  . Marital status: Divorced    Spouse name: Not on file  . Number of children: Not on file  . Years of education: Not on file  . Highest education level: Not on file  Occupational History  . Not on file  Social Needs  . Financial resource strain: Not on file  . Food insecurity:    Worry: Not on file    Inability: Not on file  . Transportation needs:    Medical: Not on file    Non-medical: Not on file  Tobacco Use  . Smoking status: Former Smoker    Packs/day: 0.50    Years: 40.00    Pack years: 20.00    Types: Cigarettes    Last attempt to quit: 07/14/2017    Years since quitting: 0.0  . Smokeless tobacco: Never Used  . Tobacco comment: smokes 3 ciggerettes daily for the past 2 months  Substance and Sexual Activity  . Alcohol use: Yes    Alcohol/week: 0.0 oz    Comment: Occasional  . Drug use: No  . Sexual activity: Yes  Lifestyle  . Physical activity:    Days per week: Not on file    Minutes per session: Not on file  .  Stress: Not on file  Relationships  . Social connections:    Talks on phone: Not on file    Gets together: Not on file    Attends religious service: Not on file    Active member of club or organization: Not on file    Attends meetings of clubs or organizations: Not on file    Relationship status: Not on file  . Intimate partner violence:    Fear of current or ex partner: Not on file    Emotionally abused: Not on file    Physically abused: Not on file    Forced sexual activity: Not on file  Other Topics Concern  . Not on file  Social History Narrative  . Not on file     Family History:    Family History  Problem Relation Age of Onset  . Diabetes Mother   . Diabetes Father   . Stroke Father   . Colon cancer Maternal Uncle   . Heart disease Brother        Died of MI at 57      Review of Systems    General:  No chills, fever, night sweats or weight changes.  Cardiovascular:  No dyspnea on exertion, edema, orthopnea, palpitations, paroxysmal nocturnal dyspnea. Positive for chest pain and claudication.  Dermatological: No rash, lesions/masses Respiratory: No cough, dyspnea Urologic: No hematuria, dysuria Abdominal:   No nausea, vomiting, diarrhea, bright red blood per rectum, melena, or hematemesis Neurologic:  No visual changes, wkns, changes in mental status. All other systems reviewed and are otherwise negative except as noted above.  Physical Exam/Data    Vitals:   08/07/17 0700 08/07/17 0730 08/07/17 0800 08/07/17 0850  BP: (!) 162/98 (!) 154/93 (!) 153/81 (!) 151/78  Pulse: (!) 56 (!) 57 (!) 56 66  Resp: (!) 24 18 18 18   Temp:    98.2 F (36.8 C)  TempSrc:    Oral  SpO2: 100% 98% 99% 96%  Weight:    186 lb 15.2 oz (84.8 kg)  Height:    6' (1.829 m)   No intake or output data in the 24 hours ending 08/07/17 1207 Filed Weights   08/07/17  0272 08/07/17 0850  Weight: 180 lb (81.6 kg) 186 lb 15.2 oz (84.8 kg)   Body mass index is 25.35 kg/m.   General:  Pleasant, NAD Psych: Normal affect. Neuro: Alert and oriented X 3. Moves all extremities spontaneously. HEENT: Normal  Neck: Supple without bruits or JVD. Lungs:  Resp regular and unlabored, CTA. Heart: RRR no s3, s4, or murmurs. Abdomen: Soft, non-tender, non-distended, BS + x 4.  Extremities: No clubbing, cyanosis or edema. DP/PT/Radials 2+ and equal bilaterally.   EKG:  The EKG was personally reviewed and demonstrates: NSR, HR 67, with PVC's and no acute ST changes when compared to prior tracings.   Labs/Studies     Relevant CV Studies:  Echocardiogram: 08/2016 Study Conclusions  - Left ventricle: The cavity size was normal. Wall thickness was   increased in a pattern of mild LVH. Systolic function was normal.   The estimated ejection fraction was in the range of 55% to 60%.   Wall motion was normal; there were no regional wall motion   abnormalities. Left ventricular diastolic function parameters   were normal. - Aortic valve: Valve area (VTI): 2.39 cm^2. Valve area (Vmax):   2.42 cm^2. Valve area (Vmean): 2.15 cm^2. - Left atrium: The atrium was mildly dilated. - Right atrium: The atrium was mildly dilated. - Atrial septum: No defect or patent foramen ovale was identified. - Pulmonary arteries: Systolic pressure was mildly increased. PA   peak pressure: 36 mm Hg (S). - Technically adequate study.  Cardiac Catheterization: 05/01/2017  Ost Ramus to Ramus lesion is 30% stenosed.  Prox LAD lesion is 40% stenosed.  Dist LAD lesion is 90% stenosed.  2nd Diag lesion is 100% stenosed.  Mid RCA lesion is 30% stenosed.  The left ventricular systolic function is normal.  LV end diastolic pressure is normal.  The left ventricular ejection fraction is 55-65% by visual estimate.   1.  Significant one-vessel coronary artery disease with 90% stenosis in the distal LAD close to the apex.  Occluded small second diagonal with faint collaterals. 2.  Normal LV systolic  function and left ventricular end-diastolic pressure.  Recommendations: Recommend medical therapy for coronary artery disease.  The distal LAD stenosis is too distal vessel diameter is 1.5-2 mm in that area. The patient should follow-up with me for peripheral arterial disease and recurrent left calf claudication.  Laboratory Data:  Chemistry Recent Labs  Lab 08/07/17 0556 08/07/17 0836  NA 139  --   K 3.9  --   CL 106  --   CO2 27  --   GLUCOSE 115*  --   BUN 14  --   CREATININE 1.00 1.09  CALCIUM 9.1  --   GFRNONAA >60 >60  GFRAA >60 >60  ANIONGAP 6  --     No results for input(s): PROT, ALBUMIN, AST, ALT, ALKPHOS, BILITOT in the last 168 hours. Hematology Recent Labs  Lab 08/07/17 0556 08/07/17 0836  WBC 5.2 6.4  RBC 4.66 4.49  HGB 14.8 14.3  HCT 40.7 39.6  MCV 87.3 88.2  MCH 31.8 31.8  MCHC 36.4* 36.1*  RDW 13.7 13.6  PLT 253 245   Cardiac Enzymes Recent Labs  Lab 08/07/17 0836  TROPONINI <0.03    Recent Labs  Lab 08/07/17 0604  TROPIPOC 0.02    BNPNo results for input(s): BNP, PROBNP in the last 168 hours.  DDimer No results for input(s): DDIMER in the last 168 hours.  Radiology/Studies:  Dg Chest 2 View  Result  Date: 08/07/2017 CLINICAL DATA:  62 y/o M; left-sided chest pain for 3 days on and off. EXAM: CHEST - 2 VIEW COMPARISON:  04/30/2017 chest radiograph. FINDINGS: Stable heart size and mediastinal contours are within normal limits given differences in technique. Both lungs are clear. The visualized skeletal structures are unremarkable. IMPRESSION: No acute pulmonary process. Electronically Signed   By: Kristine Garbe [name]   On: 08/07/2017 06:44     Assessment & Plan  Please see attending note below for assessment and plan    For questions or updates, please contact [place] Please consult [email or web address] for contact info under Cardiology/STEMI.  Signed, Erma Heritage, PA-C 08/07/2017, 12:07 PM Pager:  [phone]   Attending note  Patient seen and discussed with PA Strader. 62 yo male history of CAD with history of medically managed unstable angina 04/2017 due to small poor PCI targets, PAD with prior intervention, HTN, HL, presents with chest pain.   Symptoms similar to his prior chest pains in 04/2017. Sharp pain left sided, lasting just a few seconds. No other associated symptoms. Can occur mutliple times in a day.   K 3.9, Cr 1, WBC 5.2, Hgb 13.2, Plt 253 LDL 97 TG 94  Trop neg x 2 CXR no acute process EKG SR, nonspecifici ST/T changes lateral that are chronic 04/2017 cath: LM patent, LAD 40% prox, 90% distal, D2 100%, mid RCA 30%, LVEF 55-65%. Distal LAD too small to stent 06/2017 ABI: right 1.12, left 0.83. [place] with occluded left SFA, occluded rith peroneal  Recent chest pain not overally typical of angina, though could be atypical angina given his known disease. No good PCI targets based on recent cath. No evidence of this admission to suggest new obstructive disease. We will increase norvasc to 10mg  daily for additional antianginal benefits.   Medical therapy with ASA 81mg , losartan 50, crestor 20. Has not been on beta blocker, presume due to some low heart rates at times No objective evidence of infarction. Suspect ongoing angina from his known distal LAD disease. Continue to work to intensify antianginal/medical therapy.   For PAD started on cilostazol 07/16/17 during appt with Dr Fletcher Anon. Reevaluate in 3 months, consider angiography if not improved. Some progression is symptoms, would continue his outpatient follow up with Dr Fletcher Anon.    Carlyle Dolly MD

## 2017-08-07 NOTE — ED Provider Notes (Signed)
[facility] EMERGENCY DEPARTMENT Provider Note   CSN: [id number] Arrival date & time: 08/07/17  0544     History   Chief Complaint Chief Complaint  Patient presents with  . Chest Pain    HPI Joshua Hall is a 62 y.o. male.  The history is provided by the patient.  Chest Pain   This is a new problem. The current episode started 2 days ago. The problem occurs daily. The problem has been resolved. The quality of the pain is described as sharp. The pain does not radiate. Associated symptoms include shortness of breath. Pertinent negatives include no vomiting and no weakness. Associated symptoms comments: "clammy" . He has tried nothing for the symptoms. Risk factors include being elderly.  His past medical history is significant for CAD.   Patient With extensive history of CAD, COPD presents with chest pain.  He reports he has been having intermittent episodes of chest pain for over 2 days.  It is sharp in nature, and he becomes "clammy" and short of breath.  He reports that it is  similar to prior episodes of chest pain.  No pleuritic pain.  He also reports increasing pain to left leg.  Reports he has a blockage in the leg and  now that with any movement he begins having pain.  He is not having pain at rest Past Medical History:  Diagnosis Date  . Colitis   . COPD (chronic obstructive pulmonary disease) ([place])   . Coronary atherosclerosis of native coronary artery    a. cath in 2013 showing nonobstructive disease along the LAD and RCA with 99% stenosis of D2 --> too small for intervention, medical therapy recommended b. normal NST in 10/2013  . Hyperlipidemia   . Hypertension   . PAD (peripheral artery disease) ([place])    a. s/p stent placement to the left SFA in 2015  . Tobacco use     Patient Active Problem List   Diagnosis Date Noted  . COPD (chronic obstructive pulmonary disease) ([place]) 05/01/2017  . Claudication of both lower extremities ([place]) 05/01/2017  . Closed  fracture of shaft of right ulna with routine healing 11/10/16 01/24/2017  . Tremor of right hand 09/06/2016  . Colon polyps 08/04/2015  . Dyspepsia   . Diarrhea 02/17/2015  . Drug-induced erectile dysfunction 04/25/2014  . PAD (peripheral artery disease) ([place]) 12/15/2013  . Substernal chest pain 11/03/2013  . Unstable angina ([place]) 11/03/2013  . Tobacco use 11/03/2013  . Periumbilical pain 46/96/2952  . Essential hypertension, benign 01/20/2012  . Coronary atherosclerosis of native coronary artery 10/26/2011  . Mixed hyperlipidemia 10/20/2011    Past Surgical History:  Procedure Laterality Date  . ABDOMINAL AORTAGRAM N/A 12/23/2013   Procedure: ABDOMINAL Maxcine Ham;  Surgeon: [name], MD;  Location: [place] CATH LAB;  Service: Cardiovascular;  Laterality: N/A;  . CARDIAC CATHETERIZATION  10/2011   [place]  . COLONOSCOPY N/A 03/16/2015   SLF: 1. No source for abdominal pain identified. 2. six colorectal polyps removed. 3. the colon was redundant 4. mild diverticulosis noted in the sigmoid colon 5. small internal hemorroids.   . ESOPHAGOGASTRODUODENOSCOPY N/A 03/16/2015   SLF: 1. No source for abdominal pain identified. 2. mild non-erosive gastritis and duodentitis.   . INSERTION OF MESH N/A 04/13/2015   Procedure: INSERTION OF MESH;  Surgeon: [name], MD;  Location: [place];  Service: General;  Laterality: N/A;  . LEFT HEART CATH AND CORONARY ANGIOGRAPHY N/A 05/01/2017   Procedure:  LEFT HEART CATH AND CORONARY ANGIOGRAPHY;  Surgeon: [name], MD;  Location: [place] CV LAB;  Service: Cardiovascular;  Laterality: N/A;  . LEFT HEART CATHETERIZATION WITH CORONARY ANGIOGRAM N/A 10/19/2011   Procedure: LEFT HEART CATHETERIZATION WITH CORONARY ANGIOGRAM;  Surgeon: [name], MD;  Location: [facility] CATH LAB;  Service: Cardiovascular;  Laterality: N/A;  . MOUTH SURGERY    . UMBILICAL HERNIA REPAIR N/A 04/13/2015   Procedure:  UMBILICAL HERNIA REPAIR;   Surgeon: [name], MD;  Location: [place];  Service: General;  Laterality: N/A;        Home Medications    Prior to Admission medications   Medication Sig Start Date End Date Taking? Authorizing Provider  albuterol (PROVENTIL HFA;VENTOLIN HFA) 108 (90 BASE) MCG/ACT inhaler Inhale 2 puffs into the lungs every 6 (six) hours as needed for wheezing or shortness of breath. 01/21/12   Kathie Dike, MD  amLODipine (NORVASC) 5 MG tablet Take 1 tablet (5 mg total) by mouth daily. 11/02/14   [name], MD  aspirin EC 81 MG EC tablet Take 1 tablet (81 mg total) by mouth daily. 09/07/16   Rexene Alberts, MD  buPROPion (WELLBUTRIN XL) 150 MG 24 hr tablet take 1 tablet by mouth daily 07/22/15   [provider]  cilostazol (PLETAL) 100 MG tablet Take 1 tablet (100 mg total) by mouth 2 (two) times daily. 07/16/17   [name], MD  diphenhydrAMINE (BENADRYL) 25 mg capsule Take 1 capsule (25 mg total) by mouth every 4 (four) hours as needed. 11/13/16   Carole Civil, MD  EPIPEN 2-PAK 0.3 MG/0.3ML SOAJ injection Inject 0.3 mg as directed daily as needed. Allergic reaction 10/08/14   [provider]  HYDROcodone-acetaminophen (NORCO) 5-325 MG tablet Take 1 tablet every 6 (six) hours as needed by mouth for moderate pain. 12/25/16   Carole Civil, MD  ibuprofen (ADVIL,MOTRIN) 800 MG tablet Take 1 tablet (800 mg total) every 8 (eight) hours as needed by mouth. 12/25/16   Carole Civil, MD  losartan (COZAAR) 50 MG tablet Take 1 tablet (50 mg total) by mouth daily. For treatment of high blood pressure and for your heart. 06/11/17   [name], MD  nitroGLYCERIN (NITROSTAT) 0.4 MG SL tablet Place 0.4 mg under the tongue every 5 (five) minutes as needed for chest pain (3 doses MAX).    [provider]  ondansetron (ZOFRAN ODT) 4 MG disintegrating tablet 4mg  ODT q4 hours prn nausea/vomit 02/21/15   Elnora Morrison, MD  rosuvastatin (CRESTOR)  20 MG tablet Take 1 tablet (20 mg total) by mouth daily. 06/11/17   [name], MD  traMADol (ULTRAM) 50 MG tablet TAKE 1 TABLET BY MOUTH EVERY 6 HOURS AS NEEDED FOR PAIN. 02/15/15   [provider]    Family History Family History  Problem Relation Age of Onset  . Diabetes Mother   . Diabetes Father   . Stroke Father   . Colon cancer Maternal Uncle   . Heart disease Brother        Died of MI at 68    Social History Social History   Tobacco Use  . Smoking status: Former Smoker    Packs/day: 0.50    Years: 40.00    Pack years: 20.00    Types: Cigarettes    Last attempt to quit: 07/14/2017    Years since quitting: 0.0  . Smokeless tobacco: Never Used  . Tobacco comment: smokes 3  ciggerettes daily for the past 2 months  Substance Use Topics  . Alcohol use: Yes    Alcohol/week: 0.0 oz    Comment: Occasional  . Drug use: No     Allergies   Bee venom   Review of Systems Review of Systems  Respiratory: Positive for shortness of breath.   Cardiovascular: Positive for chest pain.  Gastrointestinal: Negative for vomiting.  Neurological: Negative for weakness.  All other systems reviewed and are negative.    Physical Exam Updated Vital Signs BP (!) 160/93   Pulse 63   Temp 97.6 F (36.4 C)   Resp 18   Ht 1.829 m (6')   Wt 81.6 kg (180 lb)   SpO2 98%   BMI 24.41 kg/m   Physical Exam  CONSTITUTIONAL: Elderly, no acute distress HEAD: Normocephalic/atraumatic EYES: EOMI/PERRL ENMT: Mucous membranes moist NECK: supple no meningeal signs SPINE/BACK:entire spine nontender CV: S1/S2 noted, no murmurs/rubs/gallops noted LUNGS: Lungs are clear to auscultation bilaterally, no apparent distress ABDOMEN: soft, nontender GU:no cva tenderness NEURO: Pt is awake/alert/appropriate, moves all extremitiesx4.  No facial droop.   EXTREMITIES:  full ROM, right foot strong pulse noted, left foot pulse found by doppler SKIN: warm, color normal PSYCH: no  abnormalities of mood noted, alert and oriented to situation   ED Treatments / Results  Labs (all labs ordered are listed, but only abnormal results are displayed) Labs Reviewed  BASIC METABOLIC PANEL - Abnormal; Notable for the following components:      Result Value   Glucose, Bld 115 (*)    All other components within normal limits  CBC - Abnormal; Notable for the following components:   MCHC 36.4 (*)    All other components within normal limits  I-STAT TROPONIN, ED    EKG EKG Interpretation  Date/Time:  Wednesday August 07 2017 05:52:31 EDT Ventricular Rate:  67 PR Interval:    QRS Duration: 86 QT Interval:  415 QTC Calculation: 439 R Axis:   49 Text Interpretation:  Sinus rhythm Ventricular premature complex Probable left atrial enlargement No significant change since last tracing Abnormal ekg Confirmed by Ripley Fraise [phone]) on 08/07/2017 5:56:26 AM   Radiology Dg Chest 2 View  Result Date: 08/07/2017 CLINICAL DATA:  62 y/o M; left-sided chest pain for 3 days on and off. EXAM: CHEST - 2 VIEW COMPARISON:  04/30/2017 chest radiograph. FINDINGS: Stable heart size and mediastinal contours are within normal limits given differences in technique. Both lungs are clear. The visualized skeletal structures are unremarkable. IMPRESSION: No acute pulmonary process. Electronically Signed   By: Kristine Garbe [name]   On: 08/07/2017 06:44    Procedures Procedures    Medications Ordered in ED Medications  aspirin chewable tablet 324 mg (324 mg Oral Given 08/07/17 0650)     Initial Impression / Assessment and Plan / ED Course  I have reviewed the triage vital signs and the nursing notes.  Pertinent labs & imaging results that were available during my care of the patient were reviewed by me and considered in my medical decision making (see chart for details).     6:39 AM Patient with extensive history of CAD presented with chest pain.  Last cardiac cath from March  showed a 90% blockage, but was not amenable to intervention.  Due to persistent pain at home, will plan to admit for monitoring and to see if there is any elevation his troponin.  He also reports worsening left calf claudication due to SFA occlusion, and his  cardiologist was trying aggressive medical management.  However he reports since his last visit his symptoms are worsening in his left leg 7:08 AM Patient is chest pain-free at this time.  Will admit for recurrent chest pain.  Discussed with Dr. Wynetta Emery for admission.  As for his left leg claudication, per cardiology notes he was started on Pletal and was supposed to have a follow-up in several months.  Patient reports he is having escalating claudication with exertion at this time.  He may need to have sooner evaluation for this Final Clinical Impressions(s) / ED Diagnoses   Final diagnoses:  Chest pain, rule out acute myocardial infarction  Left leg claudication [facility])    ED Discharge Orders    None       Ripley Fraise, MD 08/07/17 [phone]

## 2017-08-07 NOTE — ED Triage Notes (Signed)
Pt c/o intermittent shooting pain on left chest x 3 days.

## 2017-08-07 NOTE — ED Notes (Signed)
Doppler used to find pedal pulse of left foot. Pulse 63

## 2017-08-08 ENCOUNTER — Encounter (HOSPITAL_COMMUNITY): Payer: Self-pay | Admitting: Student

## 2017-08-08 LAB — TROPONIN I
TROPONIN I: 0.03 ng/mL — AB (ref <0.03)
TROPONIN I: 0.03 ng/mL — AB (ref <0.03)

## 2017-08-08 LAB — MAGNESIUM: Magnesium: 1.8 mg/dL (ref 1.7–2.4)

## 2017-08-08 MED ORDER — AMLODIPINE BESYLATE 10 MG PO TABS: 10.0000 mg | ORAL_TABLET | Freq: Every day | ORAL | 0 refills | Status: DC

## 2017-08-08 MED ORDER — POTASSIUM CHLORIDE CRYS ER 20 MEQ PO TBCR
20.0000 meq | EXTENDED_RELEASE_TABLET | Freq: Once | ORAL | Status: AC
Start: 2017-08-08 — End: 2017-08-08
  Administered 2017-08-08: 20 meq via ORAL
  Filled 2017-08-08: qty 1

## 2017-08-08 NOTE — Progress Notes (Addendum)
Progress Note  Patient Name: Joshua Hall Date of Encounter: 08/08/2017  Primary Cardiologist: Kathlyn Sacramento, MD   Subjective   Denies any recurrent chest pain overnight. Has been ambulating around the room without any recurrent anginal symptoms but reports worsening left leg claudication over the past several weeks which remains present.   Inpatient Medications    Scheduled Meds: . amLODipine  10 mg Oral Daily  . aspirin EC  81 mg Oral Daily  . cilostazol  100 mg Oral BID  . heparin  5,000 Units Subcutaneous Q8H  . losartan  50 mg Oral Daily  . rosuvastatin  20 mg Oral Daily   Continuous Infusions:  PRN Meds: acetaminophen, albuterol, gi cocktail, HYDROcodone-acetaminophen, morphine injection, nitroGLYCERIN, ondansetron (ZOFRAN) IV   Vital Signs    Vitals:   08/07/17 2041 08/07/17 2207 08/08/17 0200 08/08/17 0608  BP:  127/71 120/77 125/83  Pulse:  67 66 63  Resp:  16 18 20   Temp:  98.3 F (36.8 C) 98.2 F (36.8 C) 98.4 F (36.9 C)  TempSrc:  Oral Oral Oral  SpO2: 97% 98% 100% 97%  Weight:      Height:        Intake/Output Summary (Last 24 hours) at 08/08/2017 0903 Last data filed at 08/07/2017 1700 Gross per 24 hour  Intake 480 ml  Output -  Net 480 ml   Filed Weights   08/07/17 0549 08/07/17 0850  Weight: 180 lb (81.6 kg) 186 lb 15.2 oz (84.8 kg)    Telemetry    NSR, HR in mid-50's to 70's. No significant arrhythmias.  - Personally Reviewed  ECG    Sinus bradycardia, HR 59, with no acute ST changes when compared to prior tracings. - Personally Reviewed  Physical Exam   General: Well developed, well nourished African American male appearing in no acute distress. Head: Normocephalic, atraumatic.  Neck: Supple without bruits, JVD not elevated. Lungs:  Resp regular and unlabored, CTA. Heart: RRR, S1, S2, no S3, S4, or murmur; no rub. Abdomen: Soft, non-tender, non-distended with normoactive bowel sounds. No hepatomegaly. No rebound/guarding.  No obvious abdominal masses. Extremities: No clubbing, cyanosis, or edema. Distal pedal pulses are diminished along the LLE. 1+ RLE. Neuro: Alert and oriented X 3. Moves all extremities spontaneously. Psych: Normal affect.  Labs    Chemistry Recent Labs  Lab 08/07/17 0556 08/07/17 0836  NA 139  --   K 3.9  --   CL 106  --   CO2 27  --   GLUCOSE 115*  --   BUN 14  --   CREATININE 1.00 1.09  CALCIUM 9.1  --   GFRNONAA >60 >60  GFRAA >60 >60  ANIONGAP 6  --      Hematology Recent Labs  Lab 08/07/17 0556 08/07/17 0836  WBC 5.2 6.4  RBC 4.66 4.49  HGB 14.8 14.3  HCT 40.7 39.6  MCV 87.3 88.2  MCH 31.8 31.8  MCHC 36.4* 36.1*  RDW 13.7 13.6  PLT 253 245    Cardiac Enzymes Recent Labs  Lab 08/07/17 0836 08/07/17 1410 08/07/17 2314  TROPONINI <0.03 <0.03 0.03*    Recent Labs  Lab 08/07/17 0604  TROPIPOC 0.02     BNPNo results for input(s): BNP, PROBNP in the last 168 hours.   DDimer No results for input(s): DDIMER in the last 168 hours.   Radiology    Dg Chest 2 View  Result Date: 08/07/2017 CLINICAL DATA:  62 y/o M; left-sided chest pain  for 3 days on and off. EXAM: CHEST - 2 VIEW COMPARISON:  04/30/2017 chest radiograph. FINDINGS: Stable heart size and mediastinal contours are within normal limits given differences in technique. Both lungs are clear. The visualized skeletal structures are unremarkable. IMPRESSION: No acute pulmonary process. Electronically Signed   By: Kristine Garbe [name]   On: 08/07/2017 06:44    Cardiac Studies   Cardiac Catheterization: 04/2017  Ost Ramus to Ramus lesion is 30% stenosed.  Prox LAD lesion is 40% stenosed.  Dist LAD lesion is 90% stenosed.  2nd Diag lesion is 100% stenosed.  Mid RCA lesion is 30% stenosed.  The left ventricular systolic function is normal.  LV end diastolic pressure is normal.  The left ventricular ejection fraction is 55-65% by visual estimate.   1.  Significant one-vessel  coronary artery disease with 90% stenosis in the distal LAD close to the apex.  Occluded small second diagonal with faint collaterals. 2.  Normal LV systolic function and left ventricular end-diastolic pressure.  Recommendations: Recommend medical therapy for coronary artery disease.  The distal LAD stenosis is too distal vessel diameter is 1.5-2 mm in that area. The patient should follow-up with me for peripheral arterial disease and recurrent left calf claudication.  Patient Profile     62 y.o. male with past medical history of CAD (cath in 2013 showing nonobstructive CAD along LAD and RCA with 99% stenosis along D2 - too small for PCI, cath in 04/2017 with 90% distal-LAD stenosis and occluded D2 with medical management recommended), PAD (s/p L SFA stent placement in 2015), HTN, HLD, and tobacco usewho [facility] for evaluation of chest pain.   Assessment & Plan    1. Chest Pain with Mixed Features in the setting of known CAD - presented with episodes of chest pain which would occur with activity but resolve within a few seconds. Recent catheterization showed 90% distal-LAD stenosis and occluded D2 with medical management recommended due to small vessel size.  - initial and cyclic troponin values have been flat at < 0.03, < 0.03, and 0.03. EKG shows no acute ischemic changes.  - Amlodipine has been titrated to 10mg  daily for anti-anginal benefit (scheduled to receive first dose today). Continue ASA and statin therapy. Could consider the addition of Imdur as an outpatient if symptoms persist. Has not been on BB therapy given baseline bradycardia.   2. PAD - s/p L SFA stent placement in 2015. Has known lefft SFA occlusion which is followed by Dr. Fletcher Anon. Was recently started on Cilostazol and if symptoms did not improve within several months, could consider repeat angiography.   3. HTN - BP has been variable at 120/71 - 145/85 since admission. - remains on  Amlodipine and Losartan. Amlodipine has been titrated to 10mg  daily for additional anti-anginal benefit.   4. HLD - FLP this admission shows total cholesterol of 170, HDL 54, and LDL 97. Not at goal of LDL < 70. LDL previously 79 when checked 3 months prior. Remains on Crestor 20mg  daily. Would consider titration to 40mg  daily as an outpatient if he remains above goal.    For questions or updates, please contact [place] Please consult [email or web address] for contact info under Cardiology/STEMI.   Arna Medici , PA-C 9:03 AM 08/08/2017 Pager: [phone]  Patient seen and discussed with PA Ahmed Prima, I agree with her documentation above. 62 yo male with known CAD with recent cath showing 90% distal LAD disease too small for  PCI, 100%D2. Ortherwise mild to moderate nonobstructive disease. Current chest pain somewhat atypical. No strong evidence of significant ishcemia by EKG or enzymes, though his last trop technically was 0.03 as opposed to undetectable. We increased his norvasc to 10mg  for additional antianginal effects as well as HTN yesterday. Has not been on beta blocker, presume due to some low heart rates at times. Could try low dose imdur if needed. He has left leg clauidction. ABI recently 0.83, US showed occluded left SFA. Followed by Dr Fletcher Anon, recently started on cilostazol. Worsening symptoms of claudicaiton, will see if we can get earlier follow up with Dr Fletcher Anon.  Carlyle Dolly MD

## 2017-08-08 NOTE — Progress Notes (Signed)
Pt IV removed and site intact.

## 2017-08-08 NOTE — Discharge Instructions (Signed)
Please follow up with Dr. Fletcher Anon as scheduled  Seek medical care or return to ER if symptoms come back, worsen or new problems develop.     Follow with Primary MD  Celene Squibb, MD  and other consultant's as instructed your Hospitalist MD  Please get a complete blood count and chemistry panel checked by your Primary MD at your next visit, and again as instructed by your Primary MD.  Get Medicines reviewed and adjusted: Please take all your medications with you for your next visit with your Primary MD  Laboratory/radiological data: Please request your Primary MD to go over all hospital tests and procedure/radiological results at the follow up, please ask your Primary MD to get all Hospital records sent to his/her office.  In some cases, they will be blood work, cultures and biopsy results pending at the time of your discharge. Please request that your primary care [name] follows up on these results.  Also Note the following: If you experience worsening of your admission symptoms, develop shortness of breath, life threatening emergency, suicidal or homicidal thoughts you must seek medical attention immediately by calling 911 or calling your MD immediately  if symptoms less severe.  You must read complete instructions/literature along with all the possible adverse reactions/side effects for all the Medicines you take and that have been prescribed to you. Take any new Medicines after you have completely understood and accpet all the possible adverse reactions/side effects.   Do not drive when taking Pain medications or sleeping medications (Benzodaizepines)  Do not take more than prescribed Pain, Sleep and Anxiety Medications. It is not advisable to combine anxiety,sleep and pain medications without talking with your primary care practitioner  Special Instructions: If you have smoked or chewed Tobacco  in the last 2 yrs please stop smoking, stop any regular Alcohol  and or any Recreational drug  use.  Wear Seat belts while driving.  Please note: You were cared for by a hospitalist during your hospital stay. Once you are discharged, your primary care physician will handle any further medical issues. Please note that NO REFILLS for any discharge medications will be authorized once you are discharged, as it is imperative that you return to your primary care physician (or establish a relationship with a primary care physician if you do not have one) for your post hospital discharge needs so that they can reassess your need for medications and monitor your lab values.   Angina Pectoris Angina pectoris is a very bad feeling in the chest, neck, or arm. Your doctor may call it angina. There are four types of angina. Angina is caused by a lack of blood in the middle and thickest layer of the heart wall (myocardium). Angina may feel like a crushing or squeezing pain in the chest. It may feel like tightness or heavy pressure in the chest. Some people say it feels like gas, heartburn, or indigestion. Some people have symptoms other than pain. These include:  Shortness of breath.  Cold sweats.  Feeling sick to your stomach (nausea).  Feeling light-headed.  Many women have chest discomfort and some of the other symptoms. However, women often have different symptoms, such as:  Feeling tired (fatigue).  Feeling nervous for no reason.  Feeling weak for no reason.  Dizziness or fainting.  Women may have angina without any symptoms. Follow these instructions at home:  Take medicines only as told by your doctor.  Take care of other health issues as told by your doctor.  These include: ? High blood pressure (hypertension). ? Diabetes.  Follow a heart-healthy diet. Your doctor can help you to choose healthy food options and make changes.  Talk to your doctor to learn more about healthy cooking methods and use them. These  include: ? Roasting. ? Grilling. ? Broiling. ? Baking. ? Poaching. ? Steaming. ? Stir-frying.  Follow an exercise program approved by your doctor.  Keep a healthy weight. Lose weight as told by your doctor.  Rest when you are tired.  Learn to manage stress.  Do not use any tobacco, such as cigarettes, chewing tobacco, or electronic cigarettes. If you need help quitting, ask your doctor.  If you drink alcohol, and your doctor says it is okay, limit yourself to no more than 1 drink per day. One drink equals 12 ounces of beer, 5 ounces of wine, or 1 ounces of hard liquor.  Stop illegal drug use.  Keep all follow-up visits as told by your doctor. This is important. Do not take these medicines unless your doctor says that you can:  Nonsteroidal anti-inflammatory drugs (NSAIDs). These include: ? Ibuprofen. ? Naproxen. ? Celecoxib.  Vitamin supplements that have vitamin A, vitamin E, or both.  Hormone therapy that contains estrogen with or without progestin.  Get help right away if:  You have pain in your chest, neck, arm, jaw, stomach, or back that: ? Lasts more than a few minutes. ? Comes back. ? Does not get better after you take medicine under your tongue (sublingual nitroglycerin).  You have any of these symptoms for no reason: ? Gas, heartburn, or indigestion. ? Sweating a lot. ? Shortness of breath or trouble breathing. ? Feeling sick to your stomach or throwing up. ? Feeling more tired than usual. ? Feeling nervous or worrying more than usual. ? Feeling weak. ? Diarrhea.  You are suddenly dizzy or light-headed.  You faint or pass out. These symptoms may be an emergency. Do not wait to see if the symptoms will go away. Get medical help right away. Call your local emergency services (911 in the [place] Do not drive yourself to the hospital. This information is not intended to replace advice given to you by your health care provider. Make sure you discuss any  questions you have with your health care provider. Document Released: 07/18/2007 Document Revised: 07/07/2015 Document Reviewed: 06/02/2013 Elsevier Interactive Patient Education  2017 [place].   Intermittent Claudication Intermittent claudication is pain in your leg that occurs when you walk or exercise and goes away when you rest. The pain can occur in one or both legs. What are the causes? Intermittent claudication is caused by the buildup of plaque within the major arteries in the body (atherosclerosis). The plaque, which makes arteries stiff and narrow, prevents enough blood from reaching your leg muscles. The pain occurs when you walk or exercise because your muscles need more blood when you are moving and exercising. What increases the risk? Risk factors include:  A family history of atherosclerosis.  A personal history of stroke or heart disease.  Older age.  Being inactive or overweight.  Smoking cigarettes.  Having another health condition such as: ? Diabetes. ? High blood pressure. ? High cholesterol.  What are the signs or symptoms? Your hip or leg may:  Ache.  Cramp.  Feel tight.  Feel weak.  Feel heavy.  Over time, you may feel pain in your calf, thigh, or hip. How is this diagnosed? Your health care provider may diagnose intermittent  claudication based on your symptoms and medical history. Your health care provider may also do tests to learn more about your condition. These may include:  Blood tests.  An ultrasound.  Imaging tests such as angiography, magnetic resonance angiography (MRA), and computed tomography angiography (CTA).  How is this treated? You may be treated for problems such as:  High blood pressure.  High cholesterol.  Diabetes.  Other treatments may include:  Lifestyle changes such as: ? Starting an exercise program. ? Losing weight. ? Quitting smoking.  Medicines to help restore blood flow through your  legs.  Blood vessel surgery (angioplasty) to restore blood flow if your intermittent claudication is caused by severe peripheral artery disease.  Follow these instructions at home:  Manage any other health conditions you have.  Eat a diet low in saturated fats and calories to maintain a healthy weight.  Quit smoking, if you smoke.  Take medicines only as directed by your health care provider.  If your health care provider recommended an exercise program for you, follow it as directed. Your exercise program may involve: ? Walking three or more times a week. ? Walking until you have certain symptoms of intermittent claudication. ? Resting until symptoms go away. ? Gradually increasing walking time to about 50 minutes a day. Contact a health care provider if: Your condition is not getting better or is getting worse. Get help right away if:  You have chest pain.  You have difficulty breathing.  You develop arm weakness.  You have trouble speaking.  Your face begins to droop. This information is not intended to replace advice given to you by your health care provider. Make sure you discuss any questions you have with your health care provider. Document Released: 12/02/2003 Document Revised: 07/07/2015 Document Reviewed: 05/07/2013 Elsevier Interactive Patient Education  2017 [employer].  If you wish to quit smoking, help is available.  For free tobacco cessation program offerings call the [facility] at 563-278-2714 or Live Well Line at 831-320-9593. You may also visit [email or web address] or email [email or web address]  for more information on other programs.   Coping with Quitting Smoking Quitting smoking is a physical and mental challenge. You will face cravings, withdrawal symptoms, and temptation. Before quitting, work with your health care provider to make a plan that can help you cope. Preparation can help you quit and keep you from giving in. How  can I cope with cravings? Cravings usually last for 5-10 minutes. If you get through it, the craving will pass. Consider taking the following actions to help you cope with cravings:  Keep your mouth busy: ? Chew sugar-free gum. ? Suck on hard candies or a straw. ? Brush your teeth.  Keep your hands and body busy: ? Immediately change to a different activity when you feel a craving. ? Squeeze or play with a ball. ? Do an activity or a hobby, like making bead jewelry, practicing needlepoint, or working with wood. ? Mix up your normal routine. ? Take a short exercise break. Go for a quick walk or run up and down stairs. ? Spend time in public places where smoking is not allowed.  Focus on doing something kind or helpful for someone else.  Call a friend or family member to talk during a craving.  Join a support group.  Call a quit line, such as 1-800-QUIT-NOW.  Talk with your health care provider about medicines that might help you cope with cravings and make quitting easier  for you.  How can I deal with withdrawal symptoms? Your body may experience negative effects as it tries to get used to not having nicotine in the system. These effects are called withdrawal symptoms. They may include:  Feeling hungrier than normal.  Trouble concentrating.  Irritability.  Trouble sleeping.  Feeling depressed.  Restlessness and agitation.  Craving a cigarette.  To manage withdrawal symptoms:  Avoid places, people, and activities that trigger your cravings.  Remember why you want to quit.  Get plenty of sleep.  Avoid coffee and other caffeinated drinks. These may worsen some of your symptoms.  How can I handle social situations? Social situations can be difficult when you are quitting smoking, especially in the first few weeks. To manage this, you can:  Avoid parties, bars, and other social situations where people might be smoking.  Avoid alcohol.  Leave right away if you  have the urge to smoke.  Explain to your family and friends that you are quitting smoking. Ask for understanding and support.  Plan activities with friends or family where smoking is not an option.  What are some ways I can cope with stress? Wanting to smoke may cause stress, and stress can make you want to smoke. Find ways to manage your stress. Relaxation techniques can help. For example:  Breathe slowly and deeply, in through your nose and out through your mouth.  Listen to soothing, relaxing music.  Talk with a family member or friend about your stress.  Light a candle.  Soak in a bath or take a shower.  Think about a peaceful place.  What are some ways I can prevent weight gain? Be aware that many people gain weight after they quit smoking. However, not everyone does. To keep from gaining weight, have a plan in place before you quit and stick to the plan after you quit. Your plan should include:  Having healthy snacks. When you have a craving, it may help to: ? Eat plain popcorn, crunchy carrots, celery, or other cut vegetables. ? Chew sugar-free gum.  Changing how you eat: ? Eat small portion sizes at meals. ? Eat 4-6 small meals throughout the day instead of 1-2 large meals a day. ? Be mindful when you eat. Do not watch television or do other things that might distract you as you eat.  Exercising regularly: ? Make time to exercise each day. If you do not have time for a long workout, do short bouts of exercise for 5-10 minutes several times a day. ? Do some form of strengthening exercise, like weight lifting, and some form of aerobic exercise, like running or swimming.  Drinking plenty of water or other low-calorie or no-calorie drinks. Drink 6-8 glasses of water daily, or as much as instructed by your health care provider.  Summary  Quitting smoking is a physical and mental challenge. You will face cravings, withdrawal symptoms, and temptation to smoke again.  Preparation can help you as you go through these challenges.  You can cope with cravings by keeping your mouth busy (such as by chewing gum), keeping your body and hands busy, and making calls to family, friends, or a helpline for people who want to quit smoking.  You can cope with withdrawal symptoms by avoiding places where people smoke, avoiding drinks with caffeine, and getting plenty of rest.  Ask your health care provider about the different ways to prevent weight gain, avoid stress, and handle social situations. This information is not intended to replace  advice given to you by your health care provider. Make sure you discuss any questions you have with your health care provider. Document Released: 01/27/2016 Document Revised: 01/27/2016 Document Reviewed: 01/27/2016 Elsevier Interactive Patient Education  2018 [employer].   Steps to Quit Smoking Smoking tobacco can be harmful to your health and can affect almost every organ in your body. Smoking puts you, and those around you, at risk for developing many serious chronic diseases. Quitting smoking is difficult, but it is one of the best things that you can do for your health. It is never too late to quit. What are the benefits of quitting smoking? When you quit smoking, you lower your risk of developing serious diseases and conditions, such as:  Lung cancer or lung disease, such as COPD.  Heart disease.  Stroke.  Heart attack.  Infertility.  Osteoporosis and bone fractures.  Additionally, symptoms such as coughing, wheezing, and shortness of breath may get better when you quit. You may also find that you get sick less often because your body is stronger at fighting off colds and infections. If you are pregnant, quitting smoking can help to reduce your chances of having a baby of low birth weight. How do I get ready to quit? When you decide to quit smoking, create a plan to make sure that you are successful. Before you  quit:  Pick a date to quit. Set a date within the next two weeks to give you time to prepare.  Write down the reasons why you are quitting. Keep this list in places where you will see it often, such as on your bathroom mirror or in your car or wallet.  Identify the people, places, things, and activities that make you want to smoke (triggers) and avoid them. Make sure to take these actions: ? Throw away all cigarettes at home, at work, and in your car. ? Throw away smoking accessories, such as Scientist, research (medical). ? Clean your car and make sure to empty the ashtray. ? Clean your home, including curtains and carpets.  Tell your family, friends, and coworkers that you are quitting. Support from your loved ones can make quitting easier.  Talk with your health care provider about your options for quitting smoking.  Find out what treatment options are covered by your health insurance.  What strategies can I use to quit smoking? Talk with your healthcare provider about different strategies to quit smoking. Some strategies include:  Quitting smoking altogether instead of gradually lessening how much you smoke over a period of time. Research shows that quitting cold [place] is more successful than gradually quitting.  Attending in-person counseling to help you build problem-solving skills. You are more likely to have success in quitting if you attend several counseling sessions. Even short sessions of 10 minutes can be effective.  Finding resources and support systems that can help you to quit smoking and remain smoke-free after you quit. These resources are most helpful when you use them often. They can include: ? Online chats with a Social worker. ? Telephone quitlines. ? Careers information officer. ? Support groups or group counseling. ? Text messaging programs. ? Mobile phone applications.  Taking medicines to help you quit smoking. (If you are pregnant or breastfeeding, talk with your  health care provider first.) Some medicines contain nicotine and some do not. Both types of medicines help with cravings, but the medicines that include nicotine help to relieve withdrawal symptoms. Your health care provider may recommend: ? Nicotine patches,  gum, or lozenges. ? Nicotine inhalers or sprays. ? Non-nicotine medicine that is taken by mouth.  Talk with your health care provider about combining strategies, such as taking medicines while you are also receiving in-person counseling. Using these two strategies together makes you more likely to succeed in quitting than if you used either strategy on its own. If you are pregnant or breastfeeding, talk with your health care provider about finding counseling or other support strategies to quit smoking. Do not take medicine to help you quit smoking unless told to do so by your health care provider. What things can I do to make it easier to quit? Quitting smoking might feel overwhelming at first, but there is a lot that you can do to make it easier. Take these important actions:  Reach out to your family and friends and ask that they support and encourage you during this time. Call telephone quitlines, reach out to support groups, or work with a counselor for support.  Ask people who smoke to avoid smoking around you.  Avoid places that trigger you to smoke, such as bars, parties, or smoke-break areas at work.  Spend time around people who do not smoke.  Lessen stress in your life, because stress can be a smoking trigger for some people. To lessen stress, try: ? Exercising regularly. ? Deep-breathing exercises. ? Yoga. ? Meditating. ? Performing a body scan. This involves closing your eyes, scanning your body from head to toe, and noticing which parts of your body are particularly tense. Purposefully relax the muscles in those areas.  Download or purchase mobile phone or tablet apps (applications) that can help you stick to your quit plan  by providing reminders, tips, and encouragement. There are many free apps, such as QuitGuide from the [employer] Office manager for Disease Control and Prevention). You can find other support for quitting smoking (smoking cessation) through smokefree.gov and other websites.  How will I feel when I quit smoking? Within the first 24 hours of quitting smoking, you may start to feel some withdrawal symptoms. These symptoms are usually most noticeable 2-3 days after quitting, but they usually do not last beyond 2-3 weeks. Changes or symptoms that you might experience include:  Mood swings.  Restlessness, anxiety, or irritation.  Difficulty concentrating.  Dizziness.  Strong cravings for sugary foods in addition to nicotine.  Mild weight gain.  Constipation.  Nausea.  Coughing or a sore throat.  Changes in how your medicines work in your body.  A depressed mood.  Difficulty sleeping (insomnia).  After the first 2-3 weeks of quitting, you may start to notice more positive results, such as:  Improved sense of smell and taste.  Decreased coughing and sore throat.  Slower heart rate.  Lower blood pressure.  Clearer skin.  The ability to breathe more easily.  Fewer sick days.  Quitting smoking is very challenging for most people. Do not get discouraged if you are not successful the first time. Some people need to make many attempts to quit before they achieve long-term success. Do your best to stick to your quit plan, and talk with your health care provider if you have any questions or concerns. This information is not intended to replace advice given to you by your health care provider. Make sure you discuss any questions you have with your health care provider. Document Released: 01/23/2001 Document Revised: 09/27/2015 Document Reviewed: 06/15/2014 Elsevier Interactive Patient Education  [employer].

## 2017-08-08 NOTE — Discharge Summary (Signed)
Physician Discharge Summary  Joshua Hall IOX:[id number] DOB: 1955-04-18 DOA: 08/07/2017  PCP: Celene Squibb, MD Cardiology: Acey Lav date: 08/07/2017 Discharge date: 08/08/2017  Admitted From: HOME  Disposition: HOME   Recommendations for Outpatient Follow-up:  1. Follow up with PCP in 1 weeks  Discharge Condition: STABLE   CODE STATUS: FULL    Brief Hospitalization Summary: Please see all hospital notes, images, labs for full details of the hospitalization. HPI: Joshua Hall is a 62 y.o. male with known CAD and unstable angina, severe PVD followed by Dr. Fletcher Anon presented to ED with increasing intermittent chest pain for the last 4 days and worsening claudication involving the left leg.  The patient is known to have significant peripheral artery disease with claudication.  He had angiography in 2015 which showed severe mid to distal left SFA stenosis and had a stent placed at that time by Dr. Fletcher Anon.  The patient also had hospitalization for unstable angina in March of this year.  He had a cardiac catheterization at that time showing 90% distal LAD stenosis close to the apex, occluded second diagonal and no other obstructive disease.  He was being treated medically for this.  The patient reports that he can only walk approximately 15 feet without significant severe left leg pain forcing him to stop.  The patient says that the intermittent chest pain presents randomly not associated with activity.  The patient says that he quit smoking approximately 4 days ago.  The patient says that nothing seems to relieve the pain.  The chest pain is exacerbated by ambulating in the sense that he developed shortness of breath while ambulating.  The patient has no radiation of pain but it is in the center of the chest and substernal and very similar to how he presented in March with unstable angina.  The patient says that he was recently seen by Dr. Fletcher Anon and started on Pletal as a treatment for his  claudication symptoms however it is not helping his symptoms.  The patient says that he has not noticed any significant changes however the pain seems to worsen with ambulation at this time.  ED course: The patient was evaluated in the emergency department and noted to have a normal troponin.  His EKG was essentially unchanged from prior testing.  The patient had Doppler studies of the left lower extremity to confirm a weak pulse.  He was noted to have warm extremities.  He was given 4 baby aspirin.  Hospital observation was requested to cycle troponins to rule out acute myocardial ischemia and to evaluate worsening claudication symptoms.  1. Recurrent chest pain/unstable angina- admitted for observation, cycled troponin negative x 3 and ruled out acute myocardial ischemia, monitored on telemetry, oxygen, nitroglycerin, aspirin and morphine ordered as needed for pain.  Given his history of unstable angina and severe coronary artery disease we asked for cardiology consultation for further management recommendations regarding his unstable angina.  They are managing him medically.  They increased his amlodipine to 10 mg daily for antianginal effect.  He should follow up with his cardiologist on 09/03/17.   2. Severe peripheral artery disease with claudication of both lower extremities worse on the left, patient reports worsening symptoms in the past several days.  Cardiology recommended that he follow up with Dr. Fletcher Anon for further evaluation.  He has appt for 09/03/17.  Continue pletal as ordered by Dr. Fletcher Anon.  Encouraged smoking cessation.  Pt says that he stopped 3-4 days ago.  3. Tobacco abuse-patient reports that he quit smoking 4 days ago.  However, he told Dr. Fletcher Anon during his visit on 6/4 that he had quit 3 days ago so I am not sure exactly if he has totally quit tobacco. 4. COPD- we will provide nebulizers as needed for symptoms. 5. Hypertension-resume home medications. 6. Hyperlipidemia-resume home  medications.  DVT Prophylaxis: Lovenox Code Status: Full Family Communication:   Disposition Plan: Home   Discharge Diagnoses:  Principal Problem:   Chest pain Active Problems:   Mixed hyperlipidemia   Coronary atherosclerosis of native coronary artery   Essential hypertension, benign   Substernal chest pain   Unstable angina (HCC)   Tobacco use   PAD (peripheral artery disease) (HCC)   COPD (chronic obstructive pulmonary disease) (HCC)   Claudication of both lower extremities (HCC)   Hypertension  Discharge Instructions: Discharge Instructions    Call MD for:  difficulty breathing, headache or visual disturbances   Complete by:  As directed    Call MD for:  extreme fatigue   Complete by:  As directed    Call MD for:  persistant dizziness or light-headedness   Complete by:  As directed    Call MD for:  severe uncontrolled pain   Complete by:  As directed    Diet - low sodium heart healthy   Complete by:  As directed    Increase activity slowly   Complete by:  As directed      Allergies as of 08/08/2017      Reactions   Bee Venom Swelling      Medication List    STOP taking these medications   traMADol 50 MG tablet Commonly known as:  ULTRAM     TAKE these medications   albuterol 108 (90 Base) MCG/ACT inhaler Commonly known as:  PROVENTIL HFA;VENTOLIN HFA Inhale 2 puffs into the lungs every 6 (six) hours as needed for wheezing or shortness of breath.   amLODipine 10 MG tablet Commonly known as:  NORVASC Take 1 tablet (10 mg total) by mouth daily. What changed:    medication strength  how much to take   aspirin 81 MG EC tablet Take 1 tablet (81 mg total) by mouth daily.   buPROPion 150 MG 24 hr tablet Commonly known as:  WELLBUTRIN XL take 1 tablet by mouth daily   cilostazol 100 MG tablet Commonly known as:  PLETAL Take 1 tablet (100 mg total) by mouth 2 (two) times daily.   diphenhydrAMINE 25 mg capsule Commonly known as:  BENADRYL Take 1  capsule (25 mg total) by mouth every 4 (four) hours as needed.   EPIPEN 2-PAK 0.3 mg/0.3 mL Soaj injection Generic drug:  EPINEPHrine Inject 0.3 mg as directed daily as needed. Allergic reaction   HYDROcodone-acetaminophen 5-325 MG tablet Commonly known as:  NORCO Take 1 tablet every 6 (six) hours as needed by mouth for moderate pain.   ibuprofen 800 MG tablet Commonly known as:  ADVIL,MOTRIN Take 1 tablet (800 mg total) every 8 (eight) hours as needed by mouth.   losartan 50 MG tablet Commonly known as:  COZAAR Take 1 tablet (50 mg total) by mouth daily. For treatment of high blood pressure and for your heart.   nitroGLYCERIN 0.4 MG SL tablet Commonly known as:  NITROSTAT Place 0.4 mg under the tongue every 5 (five) minutes as needed for chest pain (3 doses MAX).   ondansetron 4 MG disintegrating tablet Commonly known as:  ZOFRAN ODT 4mg  ODT q4  hours prn nausea/vomit What changed:    how much to take  how to take this  additional instructions   rosuvastatin 20 MG tablet Commonly known as:  CRESTOR Take 1 tablet (20 mg total) by mouth daily.      Follow-up Information    [name], MD Follow up on 09/03/2017.   Specialty:  Cardiology Why:  Follow-Up with Dr. Fletcher Anon on 09/03/2017 at 10:00AM.  Contact information: [address] [place] [address] [phone]        Celene Squibb, MD. Schedule an appointment as soon as possible for a visit in 1 week(s).   Specialty:  Internal Medicine Contact information: [address] [place] [facility] [address] [phone]        [name], MD .   Specialty:  Cardiology Contact information: [address] [place] [address] [phone]          Allergies  Allergen Reactions  . Bee Venom Swelling   Allergies as of 08/08/2017      Reactions   Bee Venom Swelling      Medication List    STOP taking these medications   traMADol 50 MG tablet Commonly known as:   ULTRAM     TAKE these medications   albuterol 108 (90 Base) MCG/ACT inhaler Commonly known as:  PROVENTIL HFA;VENTOLIN HFA Inhale 2 puffs into the lungs every 6 (six) hours as needed for wheezing or shortness of breath.   amLODipine 10 MG tablet Commonly known as:  NORVASC Take 1 tablet (10 mg total) by mouth daily. What changed:    medication strength  how much to take   aspirin 81 MG EC tablet Take 1 tablet (81 mg total) by mouth daily.   buPROPion 150 MG 24 hr tablet Commonly known as:  WELLBUTRIN XL take 1 tablet by mouth daily   cilostazol 100 MG tablet Commonly known as:  PLETAL Take 1 tablet (100 mg total) by mouth 2 (two) times daily.   diphenhydrAMINE 25 mg capsule Commonly known as:  BENADRYL Take 1 capsule (25 mg total) by mouth every 4 (four) hours as needed.   EPIPEN 2-PAK 0.3 mg/0.3 mL Soaj injection Generic drug:  EPINEPHrine Inject 0.3 mg as directed daily as needed. Allergic reaction   HYDROcodone-acetaminophen 5-325 MG tablet Commonly known as:  NORCO Take 1 tablet every 6 (six) hours as needed by mouth for moderate pain.   ibuprofen 800 MG tablet Commonly known as:  ADVIL,MOTRIN Take 1 tablet (800 mg total) every 8 (eight) hours as needed by mouth.   losartan 50 MG tablet Commonly known as:  COZAAR Take 1 tablet (50 mg total) by mouth daily. For treatment of high blood pressure and for your heart.   nitroGLYCERIN 0.4 MG SL tablet Commonly known as:  NITROSTAT Place 0.4 mg under the tongue every 5 (five) minutes as needed for chest pain (3 doses MAX).   ondansetron 4 MG disintegrating tablet Commonly known as:  ZOFRAN ODT 4mg  ODT q4 hours prn nausea/vomit What changed:    how much to take  how to take this  additional instructions   rosuvastatin 20 MG tablet Commonly known as:  CRESTOR Take 1 tablet (20 mg total) by mouth daily.       Procedures/Studies: Dg Chest 2 View  Result Date: 08/07/2017 CLINICAL DATA:  62 y/o M;  left-sided chest pain for 3 days on and off. EXAM: CHEST - 2 VIEW COMPARISON:  04/30/2017  chest radiograph. FINDINGS: Stable heart size and mediastinal contours are within normal limits given differences in technique. Both lungs are clear. The visualized skeletal structures are unremarkable. IMPRESSION: No acute pulmonary process. Electronically Signed   By: Kristine Garbe [name]   On: 08/07/2017 06:44      Subjective: Pt says that he is feeling much better today.   He still does have claudication with minimal ambulation 15 feet or less   Discharge Exam: Vitals:   08/08/17 0200 08/08/17 0608  BP: 120/77 125/83  Pulse: 66 63  Resp: 18 20  Temp: 98.2 F (36.8 C) 98.4 F (36.9 C)  SpO2: 100% 97%   Vitals:   08/07/17 2041 08/07/17 2207 08/08/17 0200 08/08/17 0608  BP:  127/71 120/77 125/83  Pulse:  67 66 63  Resp:  16 18 20   Temp:  98.3 F (36.8 C) 98.2 F (36.8 C) 98.4 F (36.9 C)  TempSrc:  Oral Oral Oral  SpO2: 97% 98% 100% 97%  Weight:      Height:       General: Pt is alert, awake, not in acute distress Cardiovascular: RRR, S1/S2 +, no rubs, no gallops Respiratory: CTA bilaterally, no wheezing, no rhonchi Abdominal: Soft, NT, ND, bowel sounds + Extremities: no edema, no cyanosis   The results of significant diagnostics from this hospitalization (including imaging, microbiology, ancillary and laboratory) are listed below for reference.     Microbiology: Recent Results (from the past 240 hour(s))  MRSA PCR Screening     Status: Abnormal   Collection Time: 08/07/17  9:36 AM  Result Value Ref Range Status   MRSA by PCR POSITIVE (A) NEGATIVE Final    Comment:        The GeneXpert MRSA Assay (FDA approved for NASAL specimens only), is one component of a comprehensive MRSA colonization surveillance program. It is not intended to diagnose MRSA infection nor to guide or monitor treatment for MRSA infections. RESULT CALLED TO, READ BACK BY AND VERIFIED  WITH: WORKMAN K. AT 1205P ON [id number] BY THOMPSON S. Performed at [facility], [address], [place], [place] [address]     Labs: BNP (last 3 results) No results for input(s): BNP in the last 8760 hours. Basic Metabolic Panel: Recent Labs  Lab 08/07/17 0556 08/07/17 0836  NA 139  --   K 3.9  --   CL 106  --   CO2 27  --   GLUCOSE 115*  --   BUN 14  --   CREATININE 1.00 1.09  CALCIUM 9.1  --    Liver Function Tests: No results for input(s): AST, ALT, ALKPHOS, BILITOT, PROT, ALBUMIN in the last 168 hours. No results for input(s): LIPASE, AMYLASE in the last 168 hours. No results for input(s): AMMONIA in the last 168 hours. CBC: Recent Labs  Lab 08/07/17 0556 08/07/17 0836  WBC 5.2 6.4  HGB 14.8 14.3  HCT 40.7 39.6  MCV 87.3 88.2  PLT 253 245   Cardiac Enzymes: Recent Labs  Lab 08/07/17 0836 08/07/17 1410 08/07/17 2314  TROPONINI <0.03 <0.03 0.03*   BNP: Invalid input(s): POCBNP CBG: No results for input(s): GLUCAP in the last 168 hours. D-Dimer No results for input(s): DDIMER in the last 72 hours. Hgb A1c No results for input(s): HGBA1C in the last 72 hours. Lipid Profile Recent Labs    08/07/17 0836  CHOL 170  HDL 54  LDLCALC 97  TRIG 94  CHOLHDL 3.1   Thyroid function studies No results  for input(s): TSH, T4TOTAL, T3FREE, THYROIDAB in the last 72 hours.  Invalid input(s): FREET3 Anemia work up No results for input(s): VITAMINB12, FOLATE, FERRITIN, TIBC, IRON, RETICCTPCT in the last 72 hours. Urinalysis    Component Value Date/Time   COLORURINE YELLOW 02/21/2015 0845   APPEARANCEUR CLEAR 02/21/2015 0845   LABSPEC 1.010 02/21/2015 0845   PHURINE 6.0 02/21/2015 0845   GLUCOSEU NEGATIVE 02/21/2015 0845   HGBUR MODERATE (A) 02/21/2015 0845   BILIRUBINUR NEGATIVE 02/21/2015 0845   KETONESUR NEGATIVE 02/21/2015 0845   PROTEINUR NEGATIVE 02/21/2015 0845   UROBILINOGEN 0.2 12/16/2013 2148   NITRITE NEGATIVE 02/21/2015 0845   LEUKOCYTESUR  NEGATIVE 02/21/2015 0845   Sepsis Labs Invalid input(s): PROCALCITONIN,  WBC,  LACTICIDVEN Microbiology Recent Results (from the past 240 hour(s))  MRSA PCR Screening     Status: Abnormal   Collection Time: 08/07/17  9:36 AM  Result Value Ref Range Status   MRSA by PCR POSITIVE (A) NEGATIVE Final    Comment:        The GeneXpert MRSA Assay (FDA approved for NASAL specimens only), is one component of a comprehensive MRSA colonization surveillance program. It is not intended to diagnose MRSA infection nor to guide or monitor treatment for MRSA infections. RESULT CALLED TO, READ BACK BY AND VERIFIED WITH: WORKMAN K. AT 1205P ON [id number] BY THOMPSON S. Performed at [facility], [address], [place], [place] [address]    Time coordinating discharge: 35 mins   SIGNED:  Irwin Brakeman, MD  [facility] 08/08/2017, 10:29 AM Pager [phone]  If 7PM-7AM, please contact night-coverage [email or web address] Password [email or web address]

## 2017-08-08 NOTE — Progress Notes (Signed)
Patient discharged home with personal belongings and prescriptions.  

## 2017-09-03 ENCOUNTER — Ambulatory Visit (INDEPENDENT_AMBULATORY_CARE_PROVIDER_SITE_OTHER): Payer: Self-pay | Admitting: Cardiovascular Disease

## 2017-09-03 ENCOUNTER — Encounter: Payer: Self-pay | Admitting: Cardiovascular Disease

## 2017-09-03 VITALS — BP 152/88 | HR 65 | Ht 72.0 in | Wt 174.2 lb

## 2017-09-03 DIAGNOSIS — Z01818 Encounter for other preprocedural examination: Secondary | ICD-10-CM

## 2017-09-03 DIAGNOSIS — I10 Essential (primary) hypertension: Secondary | ICD-10-CM

## 2017-09-03 DIAGNOSIS — I25118 Atherosclerotic heart disease of native coronary artery with other forms of angina pectoris: Secondary | ICD-10-CM

## 2017-09-03 DIAGNOSIS — I739 Peripheral vascular disease, unspecified: Secondary | ICD-10-CM

## 2017-09-03 DIAGNOSIS — E785 Hyperlipidemia, unspecified: Secondary | ICD-10-CM

## 2017-09-03 MED ORDER — LOSARTAN POTASSIUM 50 MG PO TABS: 50.0000 mg | ORAL_TABLET | Freq: Every day | ORAL | 3 refills | Status: DC

## 2017-09-03 MED ORDER — NITROGLYCERIN 0.4 MG SL SUBL: 0.4000 mg | SUBLINGUAL_TABLET | SUBLINGUAL | 1 refills | Status: DC | PRN

## 2017-09-03 MED ORDER — ROSUVASTATIN CALCIUM 20 MG PO TABS: 20.0000 mg | ORAL_TABLET | Freq: Every day | ORAL | 3 refills | Status: DC

## 2017-09-03 MED ORDER — CILOSTAZOL 100 MG PO TABS: 100.0000 mg | ORAL_TABLET | Freq: Two times a day (BID) | ORAL | 3 refills | Status: DC

## 2017-09-03 MED ORDER — AMLODIPINE BESYLATE 10 MG PO TABS: 10.0000 mg | ORAL_TABLET | Freq: Every day | ORAL | 3 refills | Status: DC

## 2017-09-03 NOTE — Patient Instructions (Signed)
Medication Instructions: Your physician recommends that you continue on your current medications as directed. Please refer to the Current Medication list given to you today.  If you need a refill on your cardiac medications before your next appointment, please call your pharmacy.   Follow-Up: Your physician wants you to follow-up in one month with Dr. Fletcher Anon.    Thank you for choosing Heart[facility] at [facility]!!        [facility] [address] [place] [address] Dept: [phone] Loc: [place]  09/03/2017  You are scheduled for a Peripheral Angiogram on Wednesday, August 7 with Dr. Kathlyn Sacramento.  1. Please arrive at the [facility] (Main Entrance A) at [facility]: [address] [place], [place] [address] at 6:30 AM (This time is two hours before your procedure to ensure your preparation). Free valet parking service is available.   Special note: Every effort is made to have your procedure done on time. Please understand that emergencies sometimes delay scheduled procedures.  2. Diet: Do not eat solid foods after midnight.  The patient may have clear liquids until 5am upon the day of the procedure.  3. Labs: You will need to have blood drawn today, 09/03/17. You do not need to be fasting.  4. Medication instructions in preparation for your procedure: None to hold    On the morning of your procedure, take your Aspirin and any morning medicines NOT listed above.  You may use sips of water.  5. Plan for one night stay--bring personal belongings. 6. Bring a current list of your medications and current insurance cards. 7. You MUST have a responsible person to drive you home. 8. Someone MUST be with you the first 24 hours after you arrive home or your discharge will be delayed. 9. Please wear clothes that are easy to get on and off and wear slip-on  shoes.  Thank you for allowing [place] to care for you!   -- [facility]

## 2017-09-03 NOTE — H&P (View-Only) (Signed)
Cardiology Office Note   Date:  09/03/2017   ID:  Joshua Hall, Joshua Hall 05/22/1955, MRN [id number]  PCP:  Celene Squibb, MD  Cardiologist:   Kathlyn Sacramento, MD   No chief complaint on file.     History of Present Illness: Joshua Hall is a 62 y.o. male who presents for a follow-up visit regarding coronary artery disease and peripheral arterial disease. He has chronic medical conditions that include tobacco use, hypertension and hyperlipidemia.  He is known to have peripheral arterial disease with claudication.  He had angiography in 2015 which showed severe mid to distal left SFA stenosis with evidence of plaque rupture.  I performed successful self-expanding stent placement at that time.   He had unstable angina in March of this year. Cardiac catheterization showed 90% distal LAD stenosis close to the apex, occluded second diagonal and no other obstructive disease.  LVEDP was normal and ejection fraction was 55 to 60%.  The distal LAD was too small to stent and I have recommended medical therapy.  He was seen recently for worsening left calf claudication.  He underwent noninvasive vascular studies which showed mildly reduced ABI on the left side with evidence of left SFA occlusion from the proximal to the distal segment. He quit smoking since then. He presented recently with chest pain and ruled out for myocardial infarction.  Medical therapy was recommended based on most recent cath results.  He has been doing well with no further chest pain.  However, he continues to struggle with severe left calf claudication with short distance of walking in spite of maximizing cilostazol.  He wants something done to improve his quality of life.  No lower extremity ulceration.   Past Medical History:  Diagnosis Date  . Colitis   . COPD (chronic obstructive pulmonary disease) ([place])   . Coronary atherosclerosis of native coronary artery    a. cath in 2013 showing nonobstructive disease along the  LAD and RCA with 99% stenosis of D2 --> too small for intervention, medical therapy recommended b. normal NST in 10/2013 c. 04/2017 cath showing 90% distal-LAD stenosis and occluded D2 with medical management recommended  . Hyperlipidemia   . Hypertension   . PAD (peripheral artery disease) ([place])    a. s/p stent placement to the left SFA in 2015  . Tobacco use     Past Surgical History:  Procedure Laterality Date  . ABDOMINAL AORTAGRAM N/A 12/23/2013   Procedure: ABDOMINAL Maxcine Ham;  Surgeon: [name], MD;  Location: [facility] CATH LAB;  Service: Cardiovascular;  Laterality: N/A;  . CARDIAC CATHETERIZATION  10/2011   [place]  . COLONOSCOPY N/A 03/16/2015   SLF: 1. No source for abdominal pain identified. 2. six colorectal polyps removed. 3. the colon was redundant 4. mild diverticulosis noted in the sigmoid colon 5. small internal hemorroids.   . ESOPHAGOGASTRODUODENOSCOPY N/A 03/16/2015   SLF: 1. No source for abdominal pain identified. 2. mild non-erosive gastritis and duodentitis.   . INSERTION OF MESH N/A 04/13/2015   Procedure: INSERTION OF MESH;  Surgeon: [name], MD;  Location: [facility];  Service: General;  Laterality: N/A;  . LEFT HEART CATH AND CORONARY ANGIOGRAPHY N/A 05/01/2017   Procedure: LEFT HEART CATH AND CORONARY ANGIOGRAPHY;  Surgeon: [name], MD;  Location: [facility] CV LAB;  Service: Cardiovascular;  Laterality: N/A;  . LEFT HEART CATHETERIZATION WITH CORONARY ANGIOGRAM N/A 10/19/2011   Procedure: LEFT HEART CATHETERIZATION WITH CORONARY ANGIOGRAM;  Surgeon: [name], MD;  Location: [facility] CATH LAB;  Service: Cardiovascular;  Laterality: N/A;  . MOUTH SURGERY    . UMBILICAL HERNIA REPAIR N/A 04/13/2015   Procedure:  UMBILICAL HERNIA REPAIR;  Surgeon: [name], MD;  Location: [facility];  Service: General;  Laterality: N/A;     Current Outpatient Medications  Medication Sig Dispense Refill  . albuterol  (PROVENTIL HFA;VENTOLIN HFA) 108 (90 BASE) MCG/ACT inhaler Inhale 2 puffs into the lungs every 6 (six) hours as needed for wheezing or shortness of breath. 1 Inhaler 0  . amLODipine (NORVASC) 10 MG tablet Take 1 tablet (10 mg total) by mouth daily. 30 tablet 0  . aspirin EC 81 MG EC tablet Take 1 tablet (81 mg total) by mouth daily.    [name] buPROPion (WELLBUTRIN XL) 150 MG 24 hr tablet take 1 tablet by mouth daily  5  . cilostazol (PLETAL) 100 MG tablet Take 1 tablet (100 mg total) by mouth 2 (two) times daily. 60 tablet 2  . diphenhydrAMINE (BENADRYL) 25 mg capsule Take 1 capsule (25 mg total) by mouth every 4 (four) hours as needed. 60 capsule 2  . EPIPEN 2-PAK 0.3 MG/0.3ML SOAJ injection Inject 0.3 mg as directed daily as needed. Allergic reaction  1  . HYDROcodone-acetaminophen (NORCO) 5-325 MG tablet Take 1 tablet every 6 (six) hours as needed by mouth for moderate pain. 30 tablet 0  . ibuprofen (ADVIL,MOTRIN) 800 MG tablet Take 1 tablet (800 mg total) every 8 (eight) hours as needed by mouth. 90 tablet 1  . losartan (COZAAR) 50 MG tablet Take 1 tablet (50 mg total) by mouth daily. For treatment of high blood pressure and for your heart. 90 tablet 3  . nitroGLYCERIN (NITROSTAT) 0.4 MG SL tablet Place 0.4 mg under the tongue every 5 (five) minutes as needed for chest pain (3 doses MAX).    [name] ondansetron (ZOFRAN ODT) 4 MG disintegrating tablet 4mg  ODT q4 hours prn nausea/vomit (Patient taking differently: Take 4 mg by mouth. 4mg  ODT every 4 hours as needed for nausea/vomit) 10 tablet 0  . rosuvastatin (CRESTOR) 20 MG tablet Take 1 tablet (20 mg total) by mouth daily. 90 tablet 3   No current facility-administered medications for this visit.     Allergies:   Bee venom    Social History:  The patient  reports that he quit smoking about 7 weeks ago. His smoking use included cigarettes. He has a 20.00 pack-year smoking history. He has never used smokeless tobacco. He reports that he drinks alcohol.  He reports that he does not use drugs.   Family History:  The patient's family history includes Colon cancer in his maternal uncle; Diabetes in his father and mother; Heart disease in his brother; Stroke in his father.    ROS:  Please see the history of present illness.   Otherwise, review of systems are positive for none.   All other systems are reviewed and negative.    PHYSICAL EXAM: VS:  BP (!) 152/88   Pulse 65   Ht 6' (1.829 m)   Wt 174 lb 3.2 oz (79 kg)   SpO2 99%   BMI 23.63 kg/m  , BMI Body mass index is 23.63 kg/m. GEN: Well nourished, well developed, in no acute distress  HEENT: normal  Neck: no JVD, carotid bruits, or masses Cardiac: RRR; no murmurs, rubs, or gallops,no edema  Respiratory:  clear to auscultation bilaterally, normal work of breathing GI: soft, nontender, nondistended, +  BS MS: no deformity or atrophy  Skin: warm and dry, no rash Neuro:  Strength and sensation are intact Psych: euthymic mood, full affect Right radial pulses normal with no hematoma. Femoral pulses are normal bilaterally.  Distal pulses are not palpable on the left side.  EKG:  EKG is not ordered today.   Recent Labs: 08/07/2017: BUN 14; Creatinine, Ser 1.09; Hemoglobin 14.3; Platelets 245; Potassium 3.9; Sodium 139 08/08/2017: Magnesium 1.8    Lipid Panel    Component Value Date/Time   CHOL 170 08/07/2017 0836   TRIG 94 08/07/2017 0836   HDL 54 08/07/2017 0836   CHOLHDL 3.1 08/07/2017 0836   VLDL 19 08/07/2017 0836   LDLCALC 97 08/07/2017 0836      Wt Readings from Last 3 Encounters:  09/03/17 174 lb 3.2 oz (79 kg)  08/07/17 186 lb 15.2 oz (84.8 kg)  07/16/17 179 lb (81.2 kg)      No flowsheet data found.    ASSESSMENT AND PLAN:  1.  Coronary artery disease involving native coronary arteries with other forms of angina: I again reviewed the cardiac catheterization with him.  The LAD stenosis is to distal to stent and I recommend continuing medical therapy and  controlling risk factors.  2.  Peripheral arterial disease:   He continues to have severe lifestyle limiting claudication affecting the left calf in spite of cilostazol and quitting smoking.  He wants revascularization performed to improve his quality of life as he failed conservative therapy.  Due to that, I recommend proceeding with abdominal aortogram, lower extremity runoff and possible endovascular intervention.  I explained to him that if the occlusion is long, it might be best treated with femoropopliteal bypass.  I discussed the procedure in details as well as risks and benefits.  3.  Tobacco use : He quit smoking few months ago and I stressed the importance of staying away from all nicotine products.  4.  Essential hypertension: Blood pressure is mildly elevated but he ran out of medications.  We refilled all his medications.  5.  Hyperlipidemia: Continue treatment with rosuvastatin.  Most recent lipid profile showed an LDL of 79 which is close to target.    Disposition:   FU with with me In 1 month   Signed,  Kathlyn Sacramento, MD  09/03/2017 11:00 AM    [place]

## 2017-09-03 NOTE — Progress Notes (Signed)
Cardiology Office Note   Date:  09/03/2017   ID:  Joshua, Hall 18-Nov-1955, MRN [id number]  PCP:  Celene Squibb, MD  Cardiologist:   Kathlyn Sacramento, MD   No chief complaint on file.     History of Present Illness: Joshua Hall is a 62 y.o. male who presents for a follow-up visit regarding coronary artery disease and peripheral arterial disease. He has chronic medical conditions that include tobacco use, hypertension and hyperlipidemia.  He is known to have peripheral arterial disease with claudication.  He had angiography in 2015 which showed severe mid to distal left SFA stenosis with evidence of plaque rupture.  I performed successful self-expanding stent placement at that time.   He had unstable angina in March of this year. Cardiac catheterization showed 90% distal LAD stenosis close to the apex, occluded second diagonal and no other obstructive disease.  LVEDP was normal and ejection fraction was 55 to 60%.  The distal LAD was too small to stent and I have recommended medical therapy.  He was seen recently for worsening left calf claudication.  He underwent noninvasive vascular studies which showed mildly reduced ABI on the left side with evidence of left SFA occlusion from the proximal to the distal segment. He quit smoking since then. He presented recently with chest pain and ruled out for myocardial infarction.  Medical therapy was recommended based on most recent cath results.  He has been doing well with no further chest pain.  However, he continues to struggle with severe left calf claudication with short distance of walking in spite of maximizing cilostazol.  He wants something done to improve his quality of life.  No lower extremity ulceration.   Past Medical History:  Diagnosis Date  . Colitis   . COPD (chronic obstructive pulmonary disease) ([place])   . Coronary atherosclerosis of native coronary artery    a. cath in 2013 showing nonobstructive disease along the  LAD and RCA with 99% stenosis of D2 --> too small for intervention, medical therapy recommended b. normal NST in 10/2013 c. 04/2017 cath showing 90% distal-LAD stenosis and occluded D2 with medical management recommended  . Hyperlipidemia   . Hypertension   . PAD (peripheral artery disease) ([place])    a. s/p stent placement to the left SFA in 2015  . Tobacco use     Past Surgical History:  Procedure Laterality Date  . ABDOMINAL AORTAGRAM N/A 12/23/2013   Procedure: ABDOMINAL Maxcine Ham;  Surgeon: [name], MD;  Location: [place] CATH LAB;  Service: Cardiovascular;  Laterality: N/A;  . CARDIAC CATHETERIZATION  10/2011   [place]  . COLONOSCOPY N/A 03/16/2015   SLF: 1. No source for abdominal pain identified. 2. six colorectal polyps removed. 3. the colon was redundant 4. mild diverticulosis noted in the sigmoid colon 5. small internal hemorroids.   . ESOPHAGOGASTRODUODENOSCOPY N/A 03/16/2015   SLF: 1. No source for abdominal pain identified. 2. mild non-erosive gastritis and duodentitis.   . INSERTION OF MESH N/A 04/13/2015   Procedure: INSERTION OF MESH;  Surgeon: [name], MD;  Location: [facility];  Service: General;  Laterality: N/A;  . LEFT HEART CATH AND CORONARY ANGIOGRAPHY N/A 05/01/2017   Procedure: LEFT HEART CATH AND CORONARY ANGIOGRAPHY;  Surgeon: [name], MD;  Location: [facility] CV LAB;  Service: Cardiovascular;  Laterality: N/A;  . LEFT HEART CATHETERIZATION WITH CORONARY ANGIOGRAM N/A 10/19/2011   Procedure: LEFT HEART CATHETERIZATION WITH CORONARY ANGIOGRAM;  Surgeon: [name], MD;  Location: [facility] CATH LAB;  Service: Cardiovascular;  Laterality: N/A;  . MOUTH SURGERY    . UMBILICAL HERNIA REPAIR N/A 04/13/2015   Procedure:  UMBILICAL HERNIA REPAIR;  Surgeon: [name], MD;  Location: [place];  Service: General;  Laterality: N/A;     Current Outpatient Medications  Medication Sig Dispense Refill  . albuterol  (PROVENTIL HFA;VENTOLIN HFA) 108 (90 BASE) MCG/ACT inhaler Inhale 2 puffs into the lungs every 6 (six) hours as needed for wheezing or shortness of breath. 1 Inhaler 0  . amLODipine (NORVASC) 10 MG tablet Take 1 tablet (10 mg total) by mouth daily. 30 tablet 0  . aspirin EC 81 MG EC tablet Take 1 tablet (81 mg total) by mouth daily.    [name] buPROPion (WELLBUTRIN XL) 150 MG 24 hr tablet take 1 tablet by mouth daily  5  . cilostazol (PLETAL) 100 MG tablet Take 1 tablet (100 mg total) by mouth 2 (two) times daily. 60 tablet 2  . diphenhydrAMINE (BENADRYL) 25 mg capsule Take 1 capsule (25 mg total) by mouth every 4 (four) hours as needed. 60 capsule 2  . EPIPEN 2-PAK 0.3 MG/0.3ML SOAJ injection Inject 0.3 mg as directed daily as needed. Allergic reaction  1  . HYDROcodone-acetaminophen (NORCO) 5-325 MG tablet Take 1 tablet every 6 (six) hours as needed by mouth for moderate pain. 30 tablet 0  . ibuprofen (ADVIL,MOTRIN) 800 MG tablet Take 1 tablet (800 mg total) every 8 (eight) hours as needed by mouth. 90 tablet 1  . losartan (COZAAR) 50 MG tablet Take 1 tablet (50 mg total) by mouth daily. For treatment of high blood pressure and for your heart. 90 tablet 3  . nitroGLYCERIN (NITROSTAT) 0.4 MG SL tablet Place 0.4 mg under the tongue every 5 (five) minutes as needed for chest pain (3 doses MAX).    [name] ondansetron (ZOFRAN ODT) 4 MG disintegrating tablet 4mg  ODT q4 hours prn nausea/vomit (Patient taking differently: Take 4 mg by mouth. 4mg  ODT every 4 hours as needed for nausea/vomit) 10 tablet 0  . rosuvastatin (CRESTOR) 20 MG tablet Take 1 tablet (20 mg total) by mouth daily. 90 tablet 3   No current facility-administered medications for this visit.     Allergies:   Bee venom    Social History:  The patient  reports that he quit smoking about 7 weeks ago. His smoking use included cigarettes. He has a 20.00 pack-year smoking history. He has never used smokeless tobacco. He reports that he drinks alcohol.  He reports that he does not use drugs.   Family History:  The patient's family history includes Colon cancer in his maternal uncle; Diabetes in his father and mother; Heart disease in his brother; Stroke in his father.    ROS:  Please see the history of present illness.   Otherwise, review of systems are positive for none.   All other systems are reviewed and negative.    PHYSICAL EXAM: VS:  BP (!) 152/88   Pulse 65   Ht 6' (1.829 m)   Wt 174 lb 3.2 oz (79 kg)   SpO2 99%   BMI 23.63 kg/m  , BMI Body mass index is 23.63 kg/m. GEN: Well nourished, well developed, in no acute distress  HEENT: normal  Neck: no JVD, carotid bruits, or masses Cardiac: RRR; no murmurs, rubs, or gallops,no edema  Respiratory:  clear to auscultation bilaterally, normal work of breathing GI: soft, nontender, nondistended, +  BS MS: no deformity or atrophy  Skin: warm and dry, no rash Neuro:  Strength and sensation are intact Psych: euthymic mood, full affect Right radial pulses normal with no hematoma. Femoral pulses are normal bilaterally.  Distal pulses are not palpable on the left side.  EKG:  EKG is not ordered today.   Recent Labs: 08/07/2017: BUN 14; Creatinine, Ser 1.09; Hemoglobin 14.3; Platelets 245; Potassium 3.9; Sodium 139 08/08/2017: Magnesium 1.8    Lipid Panel    Component Value Date/Time   CHOL 170 08/07/2017 0836   TRIG 94 08/07/2017 0836   HDL 54 08/07/2017 0836   CHOLHDL 3.1 08/07/2017 0836   VLDL 19 08/07/2017 0836   LDLCALC 97 08/07/2017 0836      Wt Readings from Last 3 Encounters:  09/03/17 174 lb 3.2 oz (79 kg)  08/07/17 186 lb 15.2 oz (84.8 kg)  07/16/17 179 lb (81.2 kg)      No flowsheet data found.    ASSESSMENT AND PLAN:  1.  Coronary artery disease involving native coronary arteries with other forms of angina: I again reviewed the cardiac catheterization with him.  The LAD stenosis is to distal to stent and I recommend continuing medical therapy and  controlling risk factors.  2.  Peripheral arterial disease:   He continues to have severe lifestyle limiting claudication affecting the left calf in spite of cilostazol and quitting smoking.  He wants revascularization performed to improve his quality of life as he failed conservative therapy.  Due to that, I recommend proceeding with abdominal aortogram, lower extremity runoff and possible endovascular intervention.  I explained to him that if the occlusion is long, it might be best treated with femoropopliteal bypass.  I discussed the procedure in details as well as risks and benefits.  3.  Tobacco use : He quit smoking few months ago and I stressed the importance of staying away from all nicotine products.  4.  Essential hypertension: Blood pressure is mildly elevated but he ran out of medications.  We refilled all his medications.  5.  Hyperlipidemia: Continue treatment with rosuvastatin.  Most recent lipid profile showed an LDL of 79 which is close to target.    Disposition:   FU with with me In 1 month   Signed,  Kathlyn Sacramento, MD  09/03/2017 11:00 AM    [place]

## 2017-09-04 LAB — BASIC METABOLIC PANEL
BUN / CREAT RATIO: 8 — AB (ref 10–24)
BUN: 8 mg/dL (ref 8–27)
CALCIUM: 9.7 mg/dL (ref 8.6–10.2)
CHLORIDE: 97 mmol/L (ref 96–106)
CO2: 27 mmol/L (ref 20–29)
Creatinine, Ser: 1 mg/dL (ref 0.76–1.27)
GFR calc Af Amer: 94 mL/min/{1.73_m2} (ref >59)
GFR calc non Af Amer: 81 mL/min/{1.73_m2} (ref >59)
GLUCOSE: 97 mg/dL (ref 65–99)
POTASSIUM: 4 mmol/L (ref 3.5–5.2)
Sodium: 140 mmol/L (ref 134–144)

## 2017-09-04 LAB — CBC
Hematocrit: 43.5 % (ref 37.5–51.0)
Hemoglobin: 14.9 g/dL (ref 13.0–17.7)
MCH: 30.9 pg (ref 26.6–33.0)
MCHC: 34.3 g/dL (ref 31.5–35.7)
MCV: 90 fL (ref 79–97)
Platelets: 310 10*3/uL (ref 150–450)
RBC: 4.82 x10E6/uL (ref 4.14–5.80)
RDW: 13.9 % (ref 12.3–15.4)
WBC: 5.2 10*3/uL (ref 3.4–10.8)

## 2017-09-17 ENCOUNTER — Telehealth: Payer: Self-pay | Admitting: Cardiovascular Disease

## 2017-09-17 NOTE — Telephone Encounter (Signed)
Left message to call back  

## 2017-09-17 NOTE — Telephone Encounter (Signed)
New message   Pt is needing information about his cath surgery on tomorrow. Please advise.

## 2017-09-18 ENCOUNTER — Ambulatory Visit (HOSPITAL_COMMUNITY)
Admission: RE | Admit: 2017-09-18 | Discharge: 2017-09-18 | Disposition: A | Discharge status: Home / Self Care | Payer: Medicaid Other | Source: Ambulatory Visit | Attending: Cardiovascular Disease | Admitting: Cardiovascular Disease

## 2017-09-18 ENCOUNTER — Encounter (HOSPITAL_COMMUNITY)
Admission: RE | Disposition: A | Discharge status: Home / Self Care | Payer: Self-pay | Source: Ambulatory Visit | Attending: Cardiovascular Disease

## 2017-09-18 ENCOUNTER — Ambulatory Visit (HOSPITAL_BASED_OUTPATIENT_CLINIC_OR_DEPARTMENT_OTHER): Payer: Medicaid Other

## 2017-09-18 DIAGNOSIS — I70212 Atherosclerosis of native arteries of extremities with intermittent claudication, left leg: Secondary | ICD-10-CM

## 2017-09-18 DIAGNOSIS — Z9889 Other specified postprocedural states: Secondary | ICD-10-CM | POA: Insufficient documentation

## 2017-09-18 DIAGNOSIS — Z8249 Family history of ischemic heart disease and other diseases of the circulatory system: Secondary | ICD-10-CM | POA: Diagnosis not present

## 2017-09-18 DIAGNOSIS — Z79899 Other long term (current) drug therapy: Secondary | ICD-10-CM | POA: Insufficient documentation

## 2017-09-18 DIAGNOSIS — Z955 Presence of coronary angioplasty implant and graft: Secondary | ICD-10-CM | POA: Diagnosis not present

## 2017-09-18 DIAGNOSIS — I10 Essential (primary) hypertension: Secondary | ICD-10-CM | POA: Insufficient documentation

## 2017-09-18 DIAGNOSIS — Z823 Family history of stroke: Secondary | ICD-10-CM | POA: Diagnosis not present

## 2017-09-18 DIAGNOSIS — I70202 Unspecified atherosclerosis of native arteries of extremities, left leg: Secondary | ICD-10-CM | POA: Diagnosis present

## 2017-09-18 DIAGNOSIS — I70222 Atherosclerosis of native arteries of extremities with rest pain, left leg: Secondary | ICD-10-CM

## 2017-09-18 DIAGNOSIS — E785 Hyperlipidemia, unspecified: Secondary | ICD-10-CM | POA: Insufficient documentation

## 2017-09-18 DIAGNOSIS — I739 Peripheral vascular disease, unspecified: Secondary | ICD-10-CM

## 2017-09-18 DIAGNOSIS — Z91030 Bee allergy status: Secondary | ICD-10-CM | POA: Insufficient documentation

## 2017-09-18 DIAGNOSIS — Z7982 Long term (current) use of aspirin: Secondary | ICD-10-CM | POA: Diagnosis not present

## 2017-09-18 DIAGNOSIS — J449 Chronic obstructive pulmonary disease, unspecified: Secondary | ICD-10-CM | POA: Insufficient documentation

## 2017-09-18 DIAGNOSIS — Z01810 Encounter for preprocedural cardiovascular examination: Secondary | ICD-10-CM

## 2017-09-18 DIAGNOSIS — Z87891 Personal history of nicotine dependence: Secondary | ICD-10-CM | POA: Insufficient documentation

## 2017-09-18 HISTORY — PX: LOWER EXTREMITY ANGIOGRAPHY: CATH118251

## 2017-09-18 HISTORY — PX: ABDOMINAL AORTOGRAM: CATH118222

## 2017-09-18 SURGERY — ABDOMINAL AORTOGRAM
Anesthesia: LOCAL

## 2017-09-18 MED ORDER — SODIUM CHLORIDE 0.9% FLUSH: 3.0000 mL | Freq: Two times a day (BID) | INTRAVENOUS | Status: DC

## 2017-09-18 MED ORDER — ASPIRIN 81 MG PO CHEW: 81.0000 mg | CHEWABLE_TABLET | ORAL | Status: DC

## 2017-09-18 MED ORDER — LIDOCAINE HCL (PF) 1 % IJ SOLN
INTRAMUSCULAR | Status: AC
  Filled 2017-09-18: qty 30

## 2017-09-18 MED ORDER — SODIUM CHLORIDE 0.9% FLUSH: 3.0000 mL | INTRAVENOUS | Status: DC | PRN

## 2017-09-18 MED ORDER — MIDAZOLAM HCL 2 MG/2ML IJ SOLN
INTRAMUSCULAR | Status: DC | PRN
  Administered 2017-09-18: 1 mg via INTRAVENOUS

## 2017-09-18 MED ORDER — MIDAZOLAM HCL 2 MG/2ML IJ SOLN
INTRAMUSCULAR | Status: AC
  Filled 2017-09-18: qty 2

## 2017-09-18 MED ORDER — SODIUM CHLORIDE 0.9 % WEIGHT BASED INFUSION: 1.0000 mL/kg/h | INTRAVENOUS | Status: DC

## 2017-09-18 MED ORDER — LIDOCAINE HCL (PF) 1 % IJ SOLN
INTRAMUSCULAR | Status: DC | PRN
  Administered 2017-09-18: 18 mL

## 2017-09-18 MED ORDER — ACETAMINOPHEN 325 MG PO TABS: 650.0000 mg | ORAL_TABLET | ORAL | Status: DC | PRN

## 2017-09-18 MED ORDER — SODIUM CHLORIDE 0.9 % IV SOLN: 250.0000 mL | INTRAVENOUS | Status: DC | PRN

## 2017-09-18 MED ORDER — ONDANSETRON HCL 4 MG/2ML IJ SOLN: 4.0000 mg | Freq: Four times a day (QID) | INTRAMUSCULAR | Status: DC | PRN

## 2017-09-18 MED ORDER — HEPARIN (PORCINE) IN NACL 1000-0.9 UT/500ML-% IV SOLN
INTRAVENOUS | Status: AC
  Filled 2017-09-18: qty 1000

## 2017-09-18 MED ORDER — FENTANYL CITRATE (PF) 100 MCG/2ML IJ SOLN
INTRAMUSCULAR | Status: DC | PRN
  Administered 2017-09-18: 50 ug via INTRAVENOUS

## 2017-09-18 MED ORDER — SODIUM CHLORIDE 0.9 % IV SOLN
INTRAVENOUS | Status: AC
  Administered 2017-09-18: 10:00:00 via INTRAVENOUS

## 2017-09-18 MED ORDER — HEPARIN (PORCINE) IN NACL 1000-0.9 UT/500ML-% IV SOLN
INTRAVENOUS | Status: DC | PRN
  Administered 2017-09-18 (×2): 500 mL

## 2017-09-18 MED ORDER — SODIUM CHLORIDE 0.9 % WEIGHT BASED INFUSION
3.0000 mL/kg/h | INTRAVENOUS | Status: AC
  Administered 2017-09-18: 3 mL/kg/h via INTRAVENOUS

## 2017-09-18 MED ORDER — FENTANYL CITRATE (PF) 100 MCG/2ML IJ SOLN
INTRAMUSCULAR | Status: AC
  Filled 2017-09-18: qty 2

## 2017-09-18 MED ORDER — IODIXANOL 320 MG/ML IV SOLN
INTRAVENOUS | Status: DC | PRN
  Administered 2017-09-18: 74 mL via INTRAVENOUS

## 2017-09-18 SURGICAL SUPPLY — 11 items
CATH ANGIO 5F PIGTAIL 65CM (CATHETERS) ×1 IMPLANT
CATH CROSS OVER TEMPO 5F (CATHETERS) ×2 IMPLANT
CATH STRAIGHT 5FR 65CM (CATHETERS) ×1 IMPLANT
GUIDEWIRE ANGLED .035X150CM (WIRE) ×3 IMPLANT
KIT PV (KITS) ×3 IMPLANT
SHEATH PINNACLE 5F 10CM (SHEATH) ×1 IMPLANT
SHEATH PROBE COVER 6X72 (BAG) ×2 IMPLANT
SYRINGE MEDRAD AVANTA MACH 7 (SYRINGE) ×1 IMPLANT
TRANSDUCER W/STOPCOCK (MISCELLANEOUS) ×3 IMPLANT
TRAY PV CATH (CUSTOM PROCEDURE TRAY) ×3 IMPLANT
WIRE HI TORQ VERSACORE-J 145CM (WIRE) ×1 IMPLANT

## 2017-09-18 NOTE — Progress Notes (Addendum)
Bilateral lower extremity venous vein mapping completed

## 2017-09-18 NOTE — Interval H&P Note (Signed)
History and Physical Interval Note:  09/18/2017 8:35 AM  Joshua Hall  has presented today for surgery, with the diagnosis of PAD  The various methods of treatment have been discussed with the patient and family. After consideration of risks, benefits and other options for treatment, the patient has consented to  Procedure(s): ABDOMINAL AORTOGRAM W/LOWER EXTREMITY (N/A) as a surgical intervention .  The patient's history has been reviewed, patient examined, no change in status, stable for surgery.  I have reviewed the patient's chart and labs.  Questions were answered to the patient's satisfaction.     Kathlyn Sacramento

## 2017-09-18 NOTE — Progress Notes (Signed)
Site: Right Femoral Sheath Size: 35fr / arterial Condition prior to removal:  Level 0 Type of pressure held: manual Time pressure held: 20 min. Status of patient during pull: stable Condition of site post pull:  Level 0 Type of dressing applied: transparent w/ gauze Pulses verified: Right PT 2+ Patient's condition post pull: stable Bedrest begins at: 1005 Post instructions given to patient: yes

## 2017-09-18 NOTE — Discharge Instructions (Signed)
**Note Joshua Hall-identified via Obfuscation** Femoral Site Care °Refer to this sheet in the next few weeks. These instructions provide you with information about caring for yourself after your procedure. Your health care provider may also give you more specific instructions. Your treatment has been planned according to current medical practices, but problems sometimes occur. Call your health care provider if you have any problems or questions after your procedure. °What can I expect after the procedure? °After your procedure, it is typical to have the following: °· Bruising at the site that usually fades within 1-2 weeks. °· Blood collecting in the tissue (hematoma) that may be painful to the touch. It should usually decrease in size and tenderness within 1-2 weeks. ° °Follow these instructions at home: °· Take medicines only as directed by your health care provider. °· You may shower 24-48 hours after the procedure or as directed by your health care provider. Remove the bandage (dressing) and gently wash the site with plain soap and water. Pat the area dry with a clean towel. Do not rub the site, because this may cause bleeding. °· Do not take baths, swim, or use a hot tub until your health care provider approves. °· Check your insertion site every day for redness, swelling, or drainage. °· Do not apply powder or lotion to the site. °· Limit use of stairs to twice a day for the first 2-3 days or as directed by your health care provider. °· Do not squat for the first 2-3 days or as directed by your health care provider. °· Do not lift over 10 lb (4.5 kg) for 5 days after your procedure or as directed by your health care provider. °· Ask your health care provider when it is okay to: °? Return to work or school. °? Resume usual physical activities or sports. °? Resume sexual activity. °· Do not drive home if you are discharged the same day as the procedure. Have someone else drive you. °· You may drive 24 hours after the procedure unless otherwise instructed by  your health care provider. °· Do not operate machinery or power tools for 24 hours after the procedure or as directed by your health care provider. °· If your procedure was done as an outpatient procedure, which means that you went home the same day as your procedure, a responsible adult should be with you for the first 24 hours after you arrive home. °· Keep all follow-up visits as directed by your health care provider. This is important. °Contact a health care provider if: °· You have a fever. °· You have chills. °· You have increased bleeding from the site. Hold pressure on the site. °Get help right away if: °· You have unusual pain at the site. °· You have redness, warmth, or swelling at the site. °· You have drainage (other than a small amount of blood on the dressing) from the site. °· The site is bleeding, and the bleeding does not stop after 30 minutes of holding steady pressure on the site. °· Your leg or foot becomes pale, cool, tingly, or numb. °This information is not intended to replace advice given to you by your health care provider. Make sure you discuss any questions you have with your health care provider. °Document Released: 10/02/2013 Document Revised: 07/07/2015 Document Reviewed: 08/18/2013 °Elsevier Interactive Patient Education © 2018 [employer] ° °

## 2017-09-18 NOTE — Consult Note (Signed)
Hospital Consult    Reason for Consult:  pad Referring Physician:  Dr. Fletcher Anon MRN #:  [id number]  History of Present Illness: This is a 62 y.o. male with history of left lower extremity pain.  He does not have tissue loss or ulceration.  He has never had left lower extremity surgery.  states that he occasionally has pain at rest.  He has had a diagnostic catheter of his heart in the past.  He is followed by Dr. Fletcher Anon.  Underwent aortogram left lower extremity runoff today which demonstrated occluded SFA.  We are now considering him for left lower semi-bypass.  Past Medical History:  Diagnosis Date  . Colitis   . COPD (chronic obstructive pulmonary disease) ([place])   . Coronary atherosclerosis of native coronary artery    a. cath in 2013 showing nonobstructive disease along the LAD and RCA with 99% stenosis of D2 --> too small for intervention, medical therapy recommended b. normal NST in 10/2013 c. 04/2017 cath showing 90% distal-LAD stenosis and occluded D2 with medical management recommended  . Hyperlipidemia   . Hypertension   . PAD (peripheral artery disease) ([place])    a. s/p stent placement to the left SFA in 2015  . Tobacco use     Past Surgical History:  Procedure Laterality Date  . ABDOMINAL AORTAGRAM N/A 12/23/2013   Procedure: ABDOMINAL Maxcine Ham;  Surgeon: [name], MD;  Location: [place] CATH LAB;  Service: Cardiovascular;  Laterality: N/A;  . CARDIAC CATHETERIZATION  10/2011   [place]  . COLONOSCOPY N/A 03/16/2015   SLF: 1. No source for abdominal pain identified. 2. six colorectal polyps removed. 3. the colon was redundant 4. mild diverticulosis noted in the sigmoid colon 5. small internal hemorroids.   . ESOPHAGOGASTRODUODENOSCOPY N/A 03/16/2015   SLF: 1. No source for abdominal pain identified. 2. mild non-erosive gastritis and duodentitis.   . INSERTION OF MESH N/A 04/13/2015   Procedure: INSERTION OF MESH;  Surgeon: [name], MD;  Location: [place];  Service: General;  Laterality: N/A;  . LEFT HEART CATH AND CORONARY ANGIOGRAPHY N/A 05/01/2017   Procedure: LEFT HEART CATH AND CORONARY ANGIOGRAPHY;  Surgeon: [name], MD;  Location: [place] CV LAB;  Service: Cardiovascular;  Laterality: N/A;  . LEFT HEART CATHETERIZATION WITH CORONARY ANGIOGRAM N/A 10/19/2011   Procedure: LEFT HEART CATHETERIZATION WITH CORONARY ANGIOGRAM;  Surgeon: [name], MD;  Location: [facility] C[facility] LAB;  Service: Cardiovascular;  Laterality: N/A;  . MOUTH SURGERY    . UMBILICAL HERNIA REPAIR N/A 04/13/2015   Procedure:  UMBILICAL HERNIA REPAIR;  Surgeon: [name], MD;  Location: [place];  Service: General;  Laterality: N/A;    Allergies  Allergen Reactions  . Bee Venom Swelling    Prior to Admission medications   Medication Sig Start Date End Date Taking? Authorizing Provider  amLODipine (NORVASC) 10 MG tablet Take 1 tablet (10 mg total) by mouth daily. 09/03/17 10/03/17 Yes [name], MD  aspirin EC 81 MG EC tablet Take 1 tablet (81 mg total) by mouth daily. 09/07/16  Yes Rexene Alberts, MD  losartan (COZAAR) 50 MG tablet Take 1 tablet (50 mg total) by mouth daily. For treatment of high blood pressure and for your heart. 09/03/17  Yes [name], MD  rosuvastatin (CRESTOR) 20 MG tablet Take 1 tablet (20 mg total) by mouth daily. 09/03/17  Yes [name], MD  albuterol (PROVENTIL HFA;VENTOLIN HFA) 108 (90 BASE)  MCG/ACT inhaler Inhale 2 puffs into the lungs every 6 (six) hours as needed for wheezing or shortness of breath. 01/21/12   Kathie Dike, MD  buPROPion (WELLBUTRIN XL) 150 MG 24 hr tablet take 1 tablet by mouth daily 07/22/15   [provider]  cilostazol (PLETAL) 100 MG tablet Take 1 tablet (100 mg total) by mouth 2 (two) times daily. 09/03/17   [name], MD  diphenhydrAMINE (BENADRYL) 25 mg capsule Take 1 capsule (25 mg total) by mouth every 4 (four) hours as needed. 11/13/16    Carole Civil, MD  EPIPEN 2-PAK 0.3 MG/0.3ML SOAJ injection Inject 0.3 mg as directed daily as needed. Allergic reaction 10/08/14   [provider]  HYDROcodone-acetaminophen (NORCO) 5-325 MG tablet Take 1 tablet every 6 (six) hours as needed by mouth for moderate pain. 12/25/16   Carole Civil, MD  ibuprofen (ADVIL,MOTRIN) 800 MG tablet Take 1 tablet (800 mg total) every 8 (eight) hours as needed by mouth. 12/25/16   Carole Civil, MD  nitroGLYCERIN (NITROSTAT) 0.4 MG SL tablet Place 1 tablet (0.4 mg total) under the tongue every 5 (five) minutes as needed for chest pain (3 doses MAX). 09/03/17   [name], MD  ondansetron (ZOFRAN ODT) 4 MG disintegrating tablet 4mg  ODT q4 hours prn nausea/vomit Patient taking differently: Take 4 mg by mouth. 4mg  ODT every 4 hours as needed for nausea/vomit 02/21/15   Elnora Morrison, MD    Social History   Socioeconomic History  . Marital status: Divorced    Spouse name: Not on file  . Number of children: Not on file  . Years of education: Not on file  . Highest education level: Not on file  Occupational History  . Not on file  Social Needs  . Financial resource strain: Not on file  . Food insecurity:    Worry: Not on file    Inability: Not on file  . Transportation needs:    Medical: Not on file    Non-medical: Not on file  Tobacco Use  . Smoking status: Former Smoker    Packs/day: 0.50    Years: 40.00    Pack years: 20.00    Types: Cigarettes    Last attempt to quit: 07/14/2017    Years since quitting: 0.1  . Smokeless tobacco: Never Used  . Tobacco comment: smokes 3 ciggerettes daily for the past 2 months  Substance and Sexual Activity  . Alcohol use: Yes    Alcohol/week: 0.0 oz    Comment: Occasional  . Drug use: No  . Sexual activity: Yes  Lifestyle  . Physical activity:    Days per week: Not on file    Minutes per session: Not on file  . Stress: Not on file  Relationships  . Social connections:     Talks on phone: Not on file    Gets together: Not on file    Attends religious service: Not on file    Active member of club or organization: Not on file    Attends meetings of clubs or organizations: Not on file    Relationship status: Not on file  . Intimate partner violence:    Fear of current or ex partner: Not on file    Emotionally abused: Not on file    Physically abused: Not on file    Forced sexual activity: Not on file  Other Topics Concern  . Not on file  Social History Narrative  . Not on file  Family History  Problem Relation Age of Onset  . Diabetes Mother   . Diabetes Father   . Stroke Father   . Colon cancer Maternal Uncle   . Heart disease Brother        Died of MI at 41    ROS:  Cardiovascular: []  chest pain/pressure []  palpitations []  SOB lying flat []  DOE []  pain in legs while walking [x]  pain in legs at rest [x]  pain in legs at night []  non-healing ulcers []  hx of DVT []  swelling in legs  Pulmonary: []  productive cough []  asthma/wheezing []  home O2  Neurologic: []  weakness in []  arms []  legs []  numbness in []  arms []  legs []  hx of CVA []  mini stroke [] difficulty speaking or slurred speech []  temporary loss of vision in one eye []  dizziness  Hematologic: []  hx of cancer []  bleeding problems []  problems with blood clotting easily  Endocrine:   []  diabetes []  thyroid disease  GI []  vomiting blood []  blood in stool  GU: []  CKD/renal failure []  HD--[]  M/W/F or []  T/T/S []  burning with urination []  blood in urine  Psychiatric: []  anxiety []  depression  Musculoskeletal: []  arthritis []  joint pain  Integumentary: []  rashes []  ulcers  Constitutional: []  fever []  chills   Physical Examination  Vitals:   09/18/17 1029 09/18/17 1038  BP:  (!) 150/83  Pulse: 73 (!) 54  Resp:    Temp:    SpO2: 100% 100%   Body mass index is 24.28 kg/m.  General:  NAD HENT: WNL, normocephalic Pulmonary: normal non-labored  breathing look at it one day at the same Cardiac: Palpable bilateral femoral pulses.  Palpable right popliteal no palpable on the left. Abdomen: soft, ntnd Musculoskeletal: no muscle wasting or atrophy, no wounds or ulceration  Neurologic: A&O X 3; Appropriate Affect ; SENSATION: normal; MOTOR FUNCTION:  moving all extremities equally. Speech is fluent/normal    CBC    Component Value Date/Time   WBC 5.2 09/03/2017 1111   WBC 6.4 08/07/2017 0836   RBC 4.82 09/03/2017 1111   RBC 4.49 08/07/2017 0836   HGB 14.9 09/03/2017 1111   HCT 43.5 09/03/2017 1111   PLT 310 09/03/2017 1111   MCV 90 09/03/2017 1111   MCH 30.9 09/03/2017 1111   MCH 31.8 08/07/2017 0836   MCHC 34.3 09/03/2017 1111   MCHC 36.1 (H) 08/07/2017 0836   RDW 13.9 09/03/2017 1111   LYMPHSABS 2.4 12/16/2013 2200   MONOABS 0.7 12/16/2013 2200   EOSABS 0.3 12/16/2013 2200   BASOSABS 0.0 12/16/2013 2200    BMET    Component Value Date/Time   NA 140 09/03/2017 1111   K 4.0 09/03/2017 1111   CL 97 09/03/2017 1111   CO2 27 09/03/2017 1111   GLUCOSE 97 09/03/2017 1111   GLUCOSE 115 (H) 08/07/2017 0556   BUN 8 09/03/2017 1111   CREATININE 1.00 09/03/2017 1111   CALCIUM 9.7 09/03/2017 1111   GFRNONAA 81 09/03/2017 1111   GFRAA 94 09/03/2017 1111    COAGS: Lab Results  Component Value Date   INR 0.86 04/30/2017   INR 0.8 12/15/2013   INR 0.92 11/03/2013     Non-Invasive Vascular Imaging:   Vein mapping pending  ABIs reviewed normal on right and 0.7 on left  ASSESSMENT/PLAN: This is a 62 y.o. male with left lower extremity pain underwent angiogram demonstrated left SFA occlusion.  We will get him vein mapped today.  He has been given cardiac clearance by Dr.  Arida as low risk for surgical intervention.  We will proceed with left femoral to popliteal artery bypass grafting in the near future.  I discussed this with the patient and his family and they demonstrate very good understanding.  Vandy Fong C. Donzetta Matters,  MD [facility] Office: [phone] Pager: [phone]

## 2017-09-19 ENCOUNTER — Encounter (HOSPITAL_COMMUNITY): Payer: Self-pay | Admitting: Cardiovascular Disease

## 2017-09-20 NOTE — Telephone Encounter (Signed)
Call returned to the patient. The voicemail box was full and was unable to leave a message. The patient had his procedure on 09/18/17.

## 2017-09-23 ENCOUNTER — Other Ambulatory Visit: Payer: Self-pay | Admitting: *Deleted

## 2017-09-30 ENCOUNTER — Other Ambulatory Visit: Payer: Self-pay | Admitting: *Deleted

## 2017-10-02 NOTE — Pre-Procedure Instructions (Signed)
Joshua Hall [facility]  10/02/2017      [id number] - [place], [place] [address] [place] [address] Phone: [phone] Fax: [phone]    Your procedure is scheduled on October 08, 2017.  Report to [facility] Admitting at 530 AM.  Call this number if you have problems the morning of surgery:  [phone]   Remember:  Do not eat or drink after midnight.      Take these medicines the morning of surgery with A SIP OF WATER  Albuterol inhaler-if needed (bring inhaler with you) Amlodipine (norvasc) Aspirin 81 mg Nitrostat-if needed for chest pain  Continue aspirin and Pletal as instructed by your surgeon.  7 days prior to surgery STOP taking any Aleve, Naproxen, Ibuprofen, Motrin, Advil, Goody's, BC's, all herbal medications, fish oil, and all vitamins    Do not wear jewelry  Do not wear lotions, powders, or colognes, or deodorant.  Men may shave face and neck.  Do not bring valuables to the hospital.  [facility] is not responsible for any belongings or valuables.  Contacts, dentures or bridgework may not be worn into surgery.  Leave your suitcase in the car.  After surgery it may be brought to your room.  For patients admitted to the hospital, discharge time will be determined by your treatment team.  Patients discharged the day of surgery will not be allowed to drive home.   [facility]- Preparing For Surgery  Before surgery, you can play an important role. Because skin is not sterile, your skin needs to be as free of germs as possible. You can reduce the number of germs on your skin by washing with CHG (chlorahexidine gluconate) Soap before surgery.  CHG is an antiseptic cleaner which kills germs and bonds with the skin to continue killing germs even after washing.    Oral Hygiene is also important to reduce your risk of infection.  Remember - BRUSH YOUR TEETH THE MORNING OF  SURGERY WITH YOUR REGULAR TOOTHPASTE  Please do not use if you have an allergy to CHG or antibacterial soaps. If your skin becomes reddened/irritated stop using the CHG.  Do not shave (including legs and underarms) for at least 48 hours prior to first CHG shower. It is OK to shave your face.  Please follow these instructions carefully.   1. Shower the NIGHT BEFORE SURGERY and the MORNING OF SURGERY with CHG.   2. If you chose to wash your hair, wash your hair first as usual with your normal shampoo.  3. After you shampoo, rinse your hair and body thoroughly to remove the shampoo.  4. Use CHG as you would any other liquid soap. You can apply CHG directly to the skin and wash gently with a scrungie or a clean washcloth.   5. Apply the CHG Soap to your body ONLY FROM THE NECK DOWN.  Do not use on open wounds or open sores. Avoid contact with your eyes, ears, mouth and genitals (private parts). Wash Face and genitals (private parts)  with your normal soap.  6. Wash thoroughly, paying special attention to the area where your surgery will be performed.  7. Thoroughly rinse your body with warm water from the neck down.  8. DO NOT shower/wash with your normal soap after using and rinsing off the CHG Soap.  9. Pat yourself dry with a CLEAN TOWEL.  10. Wear CLEAN PAJAMAS to bed the night before surgery, wear comfortable clothes the morning of surgery  11. Place CLEAN SHEETS on your bed the night of your first shower and DO NOT SLEEP WITH PETS.  Day of Surgery:  Do not apply any deodorants/lotions.  Please wear clean clothes to the hospital/surgery center.   Remember to brush your teeth WITH YOUR REGULAR TOOTHPASTE.   Please read over the following fact sheets that you were given.

## 2017-10-03 ENCOUNTER — Encounter (HOSPITAL_COMMUNITY)
Admission: RE | Admit: 2017-10-03 | Discharge: 2017-10-03 | Disposition: A | Discharge status: Home / Self Care | Payer: Medicaid Other | Source: Ambulatory Visit | Attending: Vascular Surgery | Admitting: Vascular Surgery

## 2017-10-03 ENCOUNTER — Other Ambulatory Visit: Payer: Self-pay

## 2017-10-03 ENCOUNTER — Encounter (HOSPITAL_COMMUNITY): Payer: Self-pay

## 2017-10-03 DIAGNOSIS — Z01812 Encounter for preprocedural laboratory examination: Secondary | ICD-10-CM | POA: Diagnosis not present

## 2017-10-03 LAB — URINALYSIS, ROUTINE W REFLEX MICROSCOPIC
BACTERIA UA: NONE SEEN
Bilirubin Urine: NEGATIVE
Glucose, UA: NEGATIVE mg/dL
Ketones, ur: NEGATIVE mg/dL
Leukocytes, UA: NEGATIVE
Nitrite: NEGATIVE
PH: 5 (ref 5.0–8.0)
Protein, ur: NEGATIVE mg/dL
SPECIFIC GRAVITY, URINE: 1.025 (ref 1.005–1.030)

## 2017-10-03 LAB — CBC
HCT: 39.2 % (ref 39.0–52.0)
Hemoglobin: 14.2 g/dL (ref 13.0–17.0)
MCH: 31.7 pg (ref 26.0–34.0)
MCHC: 36.2 g/dL — ABNORMAL HIGH (ref 30.0–36.0)
MCV: 87.5 fL (ref 78.0–100.0)
PLATELETS: 294 10*3/uL (ref 150–400)
RBC: 4.48 MIL/uL (ref 4.22–5.81)
RDW: 13.4 % (ref 11.5–15.5)
WBC: 6.1 10*3/uL (ref 4.0–10.5)

## 2017-10-03 LAB — COMPREHENSIVE METABOLIC PANEL
ALT: 37 U/L (ref 0–44)
ANION GAP: 7 (ref 5–15)
AST: 39 U/L (ref 15–41)
Albumin: 4.1 g/dL (ref 3.5–5.0)
Alkaline Phosphatase: 76 U/L (ref 38–126)
BUN: 14 mg/dL (ref 6–20)
CHLORIDE: 107 mmol/L (ref 98–111)
CO2: 26 mmol/L (ref 22–32)
Calcium: 9.9 mg/dL (ref 8.9–10.3)
Creatinine, Ser: 1.06 mg/dL (ref 0.61–1.24)
Glucose, Bld: 93 mg/dL (ref 70–99)
Potassium: 4 mmol/L (ref 3.5–5.1)
Sodium: 140 mmol/L (ref 135–145)
Total Bilirubin: 0.4 mg/dL (ref 0.3–1.2)
Total Protein: 7.2 g/dL (ref 6.5–8.1)

## 2017-10-03 LAB — TYPE AND SCREEN
ABO/RH(D): O POS
ANTIBODY SCREEN: NEGATIVE

## 2017-10-03 LAB — PROTIME-INR
INR: 0.99
PROTHROMBIN TIME: 13 s (ref 11.4–15.2)

## 2017-10-03 LAB — ABO/RH: ABO/RH(D): O POS

## 2017-10-03 LAB — SURGICAL PCR SCREEN
MRSA, PCR: NEGATIVE
STAPHYLOCOCCUS AUREUS: NEGATIVE

## 2017-10-03 LAB — APTT: aPTT: 28 seconds (ref 24–36)

## 2017-10-03 NOTE — Progress Notes (Signed)
PCP: Allyn Kenner, MD  Cardiologist: Kathlyn Sacramento, MD  EKG: 08/07/17 in EPIC  Stress test: 09/05/17 in EPIC  ECHO: 09/05/17 in EPIC  Cardiac Cath: 04/2017 and 10/2011   Chest x-ray: 08/07/17 in [facility]

## 2017-10-04 NOTE — Progress Notes (Signed)
Anesthesia Chart Review:  Case:  [id number] Date/Time:  10/08/17 0715   Procedure:  BYPASS GRAFT FEMORAL-POPLITEAL ARTERY (Left )   Anesthesia type:  General   Pre-op diagnosis:  left leg claudication   Location:  [place] OR ROOM 12 / [place] OR   Surgeon:  Waynetta Sandy, MD      DISCUSSION: Patient is a 62 year old male scheduled for the above procedure. He was referred to vascular surgery by cardiologist Dr. Fletcher Anon following recent arteriogram showing left SFA occlusion. He felt patient was low cardiac risk for surgery.   History includes CAD (99% D2 too small for PCI '13; occluded D2 and 90% distal LAD too small for PCI 04/2017, medical therapy), PAD (s/p left SFA stent '15; left SFA occlusion 09/18/17), HTN, HLD, former smoker (quit 07/14/17), COPD, HLD.  - Hospitalized 08/07/17-08/08/17 for chest pain. Medical therapy recommended following LHC (LAD to small for PCI).  If no acute changes then I anticipate that he can proceed as planned.    VS: BP (!) 142/78   Pulse 88   Temp 36.9 C   Resp 18   Ht 6' (1.829 m)   Wt 77.1 kg   SpO2 99%   BMI 23.06 kg/m   PROVIDERS: Celene Squibb, MD is PCP Kathlyn Sacramento, MD is cardiologist  LABS: Labs reviewed: Acceptable for surgery. (all labs ordered are listed, but only abnormal results are displayed)  Labs Reviewed  CBC - Abnormal; Notable for the following components:      Result Value   MCHC 36.2 (*)    All other components within normal limits  URINALYSIS, ROUTINE W REFLEX MICROSCOPIC - Abnormal; Notable for the following components:   Hgb urine dipstick LARGE (*)    All other components within normal limits  SURGICAL PCR SCREEN  APTT  COMPREHENSIVE METABOLIC PANEL  PROTIME-INR  TYPE AND SCREEN  ABO/RH    IMAGES: CXR 08/07/17: IMPRESSION: No acute pulmonary process.  EKG: 08/08/17: SB at 59 bpm   CV: PV arteriogram 09/18/17 (Dr. Fletcher Anon): 1.  No significant aortoiliac disease. 2.  Long occlusion of the left SFA with  reconstitution distally above the knee via collaterals from the profunda.  Two-vessel runoff below the knee. Recommendations: I think his best option is femoral to above the knee popliteal artery bypass.  I discussed with Dr. Donzetta Matters. The patient is at low risk from a cardiac standpoint overall.  Cardiac cath 05/01/17:  Ost Ramus to Ramus lesion is 30% stenosed.  Prox LAD lesion is 40% stenosed.  Dist LAD lesion is 90% stenosed.  2nd Diag lesion is 100% stenosed.  Mid RCA lesion is 30% stenosed.  The left ventricular systolic function is normal.  LV end diastolic pressure is normal.  The left ventricular ejection fraction is 55-65% by visual estimate. 1.  Significant one-vessel coronary artery disease with 90% stenosis in the distal LAD close to the apex.  Occluded small second diagonal with faint collaterals. 2.  Normal LV systolic function and left ventricular end-diastolic pressure. Recommendations: Recommend medical therapy for coronary artery disease.  The distal LAD stenosis is too distal vessel diameter is 1.5-2 mm in that area. The patient should follow-up with me for peripheral arterial disease and recurrent left calf claudication.  Echo 09/05/16: Study Conclusions - Left ventricle: The cavity size was normal. Wall thickness was   increased in a pattern of mild LVH. Systolic function was normal.   The estimated ejection fraction was in the range of 55% to 60%.  Wall motion was normal; there were no regional wall motion   abnormalities. Left ventricular diastolic function parameters   were normal. - Aortic valve: Valve area (VTI): 2.39 cm^2. Valve area (Vmax):   2.42 cm^2. Valve area (Vmean): 2.15 cm^2. - Left atrium: The atrium was mildly dilated. - Right atrium: The atrium was mildly dilated. - Atrial septum: No defect or patent foramen ovale was identified. - Pulmonary arteries: Systolic pressure was mildly increased. PA   peak pressure: 36 mm Hg (S). - Technically  adequate study.   Past Medical History:  Diagnosis Date  . Colitis   . COPD (chronic obstructive pulmonary disease) ([place])   . Coronary atherosclerosis of native coronary artery    a. cath in 2013 showing nonobstructive disease along the LAD and RCA with 99% stenosis of D2 --> too small for intervention, medical therapy recommended b. normal NST in 10/2013 c. 04/2017 cath showing 90% distal-LAD stenosis and occluded D2 with medical management recommended  . Hyperlipidemia   . Hypertension   . PAD (peripheral artery disease) ([place])    a. s/p stent placement to the left SFA in 2015  . Tobacco use     Past Surgical History:  Procedure Laterality Date  . ABDOMINAL AORTAGRAM N/A 12/23/2013   Procedure: ABDOMINAL Maxcine Ham;  Surgeon: [name], MD;  Location: [place] CATH LAB;  Service: Cardiovascular;  Laterality: N/A;  . ABDOMINAL AORTOGRAM N/A 09/18/2017   Procedure: ABDOMINAL AORTOGRAM;  Surgeon: [name], MD;  Location: [place] CV LAB;  Service: Cardiovascular;  Laterality: N/A;  . CARDIAC CATHETERIZATION  10/2011   [place]  . COLONOSCOPY N/A 03/16/2015   SLF: 1. No source for abdominal pain identified. 2. six colorectal polyps removed. 3. the colon was redundant 4. mild diverticulosis noted in the sigmoid colon 5. small internal hemorroids.   . ESOPHAGOGASTRODUODENOSCOPY N/A 03/16/2015   SLF: 1. No source for abdominal pain identified. 2. mild non-erosive gastritis and duodentitis.   . INSERTION OF MESH N/A 04/13/2015   Procedure: INSERTION OF MESH;  Surgeon: [name], MD;  Location: [place];  Service: General;  Laterality: N/A;  . LEFT HEART CATH AND CORONARY ANGIOGRAPHY N/A 05/01/2017   Procedure: LEFT HEART CATH AND CORONARY ANGIOGRAPHY;  Surgeon: [name], MD;  Location: [place] CV LAB;  Service: Cardiovascular;  Laterality: N/A;  . LEFT HEART CATHETERIZATION WITH CORONARY ANGIOGRAM N/A 10/19/2011   Procedure: LEFT HEART  CATHETERIZATION WITH CORONARY ANGIOGRAM;  Surgeon: [name], MD;  Location: [facility] CATH LAB;  Service: Cardiovascular;  Laterality: N/A;  . LOWER EXTREMITY ANGIOGRAPHY Left 09/18/2017   Procedure: Lower Extremity Angiography;  Surgeon: [name], MD;  Location: [place] CV LAB;  Service: Cardiovascular;  Laterality: Left;  . MOUTH SURGERY    . UMBILICAL HERNIA REPAIR N/A 04/13/2015   Procedure:  UMBILICAL HERNIA REPAIR;  Surgeon: [name], MD;  Location: [place];  Service: General;  Laterality: N/A;    MEDICATIONS: . albuterol (PROVENTIL HFA;VENTOLIN HFA) 108 (90 BASE) MCG/ACT inhaler  . amLODipine (NORVASC) 10 MG tablet  . aspirin EC 81 MG EC tablet  . cilostazol (PLETAL) 100 MG tablet  . diphenhydrAMINE (BENADRYL) 25 mg capsule  . EPIPEN 2-PAK 0.3 MG/0.3ML SOAJ injection  . HYDROcodone-acetaminophen (NORCO) 5-325 MG tablet  . losartan (COZAAR) 50 MG tablet  . nitroGLYCERIN (NITROSTAT) 0.4 MG SL tablet  . ondansetron (ZOFRAN ODT) 4 MG disintegrating tablet  . rosuvastatin (CRESTOR) 20 MG tablet  No current facility-administered medications for this encounter.   Patient staying on both ASA and Pletal for surgery.   George Hugh [facility] Short Stay Center/Anesthesiology Phone [phone] 10/04/2017 6:22 PM

## 2017-10-08 ENCOUNTER — Inpatient Hospital Stay (HOSPITAL_COMMUNITY)
Admission: RE | Admit: 2017-10-08 | Discharge: 2017-10-12 | DRG: 253 | Disposition: A | Discharge status: Home / Self Care | Payer: Medicaid Other | Source: Ambulatory Visit | Attending: Vascular Surgery | Admitting: Vascular Surgery

## 2017-10-08 ENCOUNTER — Inpatient Hospital Stay (HOSPITAL_COMMUNITY): Payer: Medicaid Other | Admitting: Certified Registered Nurse Anesthetist

## 2017-10-08 ENCOUNTER — Inpatient Hospital Stay (HOSPITAL_COMMUNITY): Payer: Medicaid Other | Admitting: Physician Assistant

## 2017-10-08 ENCOUNTER — Encounter (HOSPITAL_COMMUNITY)
Admission: RE | Disposition: A | Discharge status: Home / Self Care | Payer: Self-pay | Source: Ambulatory Visit | Attending: Vascular Surgery

## 2017-10-08 DIAGNOSIS — Y92239 Unspecified place in hospital as the place of occurrence of the external cause: Secondary | ICD-10-CM | POA: Diagnosis not present

## 2017-10-08 DIAGNOSIS — S301XXA Contusion of abdominal wall, initial encounter: Secondary | ICD-10-CM | POA: Diagnosis not present

## 2017-10-08 DIAGNOSIS — Z87891 Personal history of nicotine dependence: Secondary | ICD-10-CM | POA: Diagnosis not present

## 2017-10-08 DIAGNOSIS — L7632 Postprocedural hematoma of skin and subcutaneous tissue following other procedure: Secondary | ICD-10-CM | POA: Diagnosis not present

## 2017-10-08 DIAGNOSIS — I10 Essential (primary) hypertension: Secondary | ICD-10-CM | POA: Diagnosis present

## 2017-10-08 DIAGNOSIS — S52224A Nondisplaced transverse fracture of shaft of right ulna, initial encounter for closed fracture: Secondary | ICD-10-CM

## 2017-10-08 DIAGNOSIS — I739 Peripheral vascular disease, unspecified: Secondary | ICD-10-CM | POA: Diagnosis present

## 2017-10-08 DIAGNOSIS — Z91030 Bee allergy status: Secondary | ICD-10-CM | POA: Diagnosis not present

## 2017-10-08 DIAGNOSIS — Z79899 Other long term (current) drug therapy: Secondary | ICD-10-CM | POA: Diagnosis not present

## 2017-10-08 DIAGNOSIS — Z7982 Long term (current) use of aspirin: Secondary | ICD-10-CM | POA: Diagnosis not present

## 2017-10-08 DIAGNOSIS — K219 Gastro-esophageal reflux disease without esophagitis: Secondary | ICD-10-CM | POA: Diagnosis present

## 2017-10-08 DIAGNOSIS — Y838 Other surgical procedures as the cause of abnormal reaction of the patient, or of later complication, without mention of misadventure at the time of the procedure: Secondary | ICD-10-CM | POA: Diagnosis not present

## 2017-10-08 DIAGNOSIS — E785 Hyperlipidemia, unspecified: Secondary | ICD-10-CM | POA: Diagnosis present

## 2017-10-08 DIAGNOSIS — J449 Chronic obstructive pulmonary disease, unspecified: Secondary | ICD-10-CM | POA: Diagnosis present

## 2017-10-08 DIAGNOSIS — I70212 Atherosclerosis of native arteries of extremities with intermittent claudication, left leg: Secondary | ICD-10-CM

## 2017-10-08 DIAGNOSIS — D696 Thrombocytopenia, unspecified: Secondary | ICD-10-CM | POA: Diagnosis not present

## 2017-10-08 DIAGNOSIS — Z95820 Peripheral vascular angioplasty status with implants and grafts: Secondary | ICD-10-CM

## 2017-10-08 DIAGNOSIS — D62 Acute posthemorrhagic anemia: Secondary | ICD-10-CM | POA: Diagnosis not present

## 2017-10-08 DIAGNOSIS — I252 Old myocardial infarction: Secondary | ICD-10-CM | POA: Diagnosis not present

## 2017-10-08 DIAGNOSIS — I878 Other specified disorders of veins: Principal | ICD-10-CM | POA: Diagnosis present

## 2017-10-08 HISTORY — PX: FEMORAL-POPLITEAL BYPASS GRAFT: SHX937

## 2017-10-08 HISTORY — DX: Acute myocardial infarction, unspecified: I21.9

## 2017-10-08 LAB — CBC
HCT: 37.4 % — ABNORMAL LOW (ref 39.0–52.0)
Hemoglobin: 13.3 g/dL (ref 13.0–17.0)
MCH: 31.1 pg (ref 26.0–34.0)
MCHC: 35.6 g/dL (ref 30.0–36.0)
MCV: 87.6 fL (ref 78.0–100.0)
Platelets: 227 10*3/uL (ref 150–400)
RBC: 4.27 MIL/uL (ref 4.22–5.81)
RDW: 13.3 % (ref 11.5–15.5)
WBC: 10.1 10*3/uL (ref 4.0–10.5)

## 2017-10-08 LAB — CREATININE, SERUM
Creatinine, Ser: 1.04 mg/dL (ref 0.61–1.24)
GFR calc Af Amer: >60 mL/min (ref >60)
GFR calc non Af Amer: >60 mL/min (ref >60)

## 2017-10-08 SURGERY — BYPASS GRAFT FEMORAL-POPLITEAL ARTERY
Anesthesia: General | Site: Leg Upper | Laterality: Left

## 2017-10-08 MED ORDER — LOSARTAN POTASSIUM 50 MG PO TABS
50.0000 mg | ORAL_TABLET | Freq: Every day | ORAL | Status: DC
  Administered 2017-10-08 – 2017-10-12 (×5): 50 mg via ORAL
  Filled 2017-10-08 (×5): qty 1

## 2017-10-08 MED ORDER — ONDANSETRON HCL 4 MG/2ML IJ SOLN
INTRAMUSCULAR | Status: AC
  Filled 2017-10-08: qty 2

## 2017-10-08 MED ORDER — CEFAZOLIN SODIUM-DEXTROSE 2-4 GM/100ML-% IV SOLN
INTRAVENOUS | Status: AC
  Filled 2017-10-08: qty 100

## 2017-10-08 MED ORDER — MIDAZOLAM HCL 2 MG/2ML IJ SOLN
INTRAMUSCULAR | Status: DC | PRN
  Administered 2017-10-08: 2 mg via INTRAVENOUS

## 2017-10-08 MED ORDER — LIDOCAINE 2% (20 MG/ML) 5 ML SYRINGE
INTRAMUSCULAR | Status: AC
  Filled 2017-10-08: qty 5

## 2017-10-08 MED ORDER — HYDRALAZINE HCL 20 MG/ML IJ SOLN: 5.0000 mg | INTRAMUSCULAR | Status: DC | PRN

## 2017-10-08 MED ORDER — MIDAZOLAM HCL 2 MG/2ML IJ SOLN
INTRAMUSCULAR | Status: AC
  Filled 2017-10-08: qty 2

## 2017-10-08 MED ORDER — CHLORHEXIDINE GLUCONATE CLOTH 2 % EX PADS: 6.0000 | MEDICATED_PAD | Freq: Once | CUTANEOUS | Status: DC

## 2017-10-08 MED ORDER — SCOPOLAMINE 1 MG/3DAYS TD PT72
MEDICATED_PATCH | TRANSDERMAL | Status: DC | PRN
  Administered 2017-10-08: 1 via TRANSDERMAL

## 2017-10-08 MED ORDER — ALBUTEROL SULFATE (2.5 MG/3ML) 0.083% IN NEBU: 3.0000 mL | INHALATION_SOLUTION | Freq: Four times a day (QID) | RESPIRATORY_TRACT | Status: DC | PRN

## 2017-10-08 MED ORDER — ONDANSETRON HCL 4 MG/2ML IJ SOLN: 4.0000 mg | Freq: Four times a day (QID) | INTRAMUSCULAR | Status: DC | PRN

## 2017-10-08 MED ORDER — HEPARIN SODIUM (PORCINE) 1000 UNIT/ML IJ SOLN
INTRAMUSCULAR | Status: AC
  Filled 2017-10-08: qty 1

## 2017-10-08 MED ORDER — POLYETHYLENE GLYCOL 3350 17 G PO PACK: 17.0000 g | PACK | Freq: Every day | ORAL | Status: DC | PRN

## 2017-10-08 MED ORDER — DEXAMETHASONE SODIUM PHOSPHATE 10 MG/ML IJ SOLN
INTRAMUSCULAR | Status: DC | PRN
  Administered 2017-10-08: 10 mg via INTRAVENOUS

## 2017-10-08 MED ORDER — DIPHENHYDRAMINE HCL 25 MG PO CAPS: 25.0000 mg | ORAL_CAPSULE | ORAL | Status: DC | PRN

## 2017-10-08 MED ORDER — PANTOPRAZOLE SODIUM 40 MG PO TBEC
40.0000 mg | DELAYED_RELEASE_TABLET | Freq: Every day | ORAL | Status: DC
  Administered 2017-10-09 – 2017-10-12 (×4): 40 mg via ORAL
  Filled 2017-10-08 (×4): qty 1

## 2017-10-08 MED ORDER — HEPARIN SODIUM (PORCINE) 5000 UNIT/ML IJ SOLN
5000.0000 [IU] | Freq: Three times a day (TID) | INTRAMUSCULAR | Status: DC
  Administered 2017-10-09 – 2017-10-12 (×9): 5000 [IU] via SUBCUTANEOUS
  Filled 2017-10-08 (×9): qty 1

## 2017-10-08 MED ORDER — SODIUM CHLORIDE 0.9 % IV SOLN
INTRAVENOUS | Status: DC | PRN
  Administered 2017-10-08: 25 ug/min via INTRAVENOUS

## 2017-10-08 MED ORDER — FENTANYL CITRATE (PF) 250 MCG/5ML IJ SOLN
INTRAMUSCULAR | Status: AC
  Filled 2017-10-08: qty 5

## 2017-10-08 MED ORDER — NITROGLYCERIN 0.4 MG SL SUBL: 0.4000 mg | SUBLINGUAL_TABLET | SUBLINGUAL | Status: DC | PRN

## 2017-10-08 MED ORDER — DOCUSATE SODIUM 100 MG PO CAPS
100.0000 mg | ORAL_CAPSULE | Freq: Every day | ORAL | Status: DC
  Administered 2017-10-09 – 2017-10-12 (×3): 100 mg via ORAL
  Filled 2017-10-08 (×4): qty 1

## 2017-10-08 MED ORDER — GLYCOPYRROLATE PF 0.2 MG/ML IJ SOSY
PREFILLED_SYRINGE | INTRAMUSCULAR | Status: AC
  Filled 2017-10-08: qty 1

## 2017-10-08 MED ORDER — SODIUM CHLORIDE 0.9 % IV SOLN
INTRAVENOUS | Status: AC
  Filled 2017-10-08: qty 1.2

## 2017-10-08 MED ORDER — ROSUVASTATIN CALCIUM 10 MG PO TABS
20.0000 mg | ORAL_TABLET | Freq: Every day | ORAL | Status: DC
  Administered 2017-10-08 – 2017-10-11 (×4): 20 mg via ORAL
  Filled 2017-10-08 (×4): qty 2

## 2017-10-08 MED ORDER — SCOPOLAMINE 1 MG/3DAYS TD PT72
MEDICATED_PATCH | TRANSDERMAL | Status: AC
  Filled 2017-10-08: qty 1

## 2017-10-08 MED ORDER — PROPOFOL 10 MG/ML IV BOLUS
INTRAVENOUS | Status: AC
  Filled 2017-10-08: qty 40

## 2017-10-08 MED ORDER — ALUM & MAG HYDROXIDE-SIMETH 200-200-20 MG/5ML PO SUSP: 15.0000 mL | ORAL | Status: DC | PRN

## 2017-10-08 MED ORDER — CEFAZOLIN SODIUM-DEXTROSE 2-4 GM/100ML-% IV SOLN
2.0000 g | INTRAVENOUS | Status: AC
  Administered 2017-10-08: 2 g via INTRAVENOUS

## 2017-10-08 MED ORDER — MORPHINE SULFATE (PF) 2 MG/ML IV SOLN: 2.0000 mg | INTRAVENOUS | Status: DC | PRN

## 2017-10-08 MED ORDER — SUGAMMADEX SODIUM 200 MG/2ML IV SOLN
INTRAVENOUS | Status: DC | PRN
  Administered 2017-10-08: 200 mg via INTRAVENOUS

## 2017-10-08 MED ORDER — LIDOCAINE 2% (20 MG/ML) 5 ML SYRINGE
INTRAMUSCULAR | Status: DC | PRN
  Administered 2017-10-08: 100 mg via INTRAVENOUS

## 2017-10-08 MED ORDER — BISACODYL 10 MG RE SUPP: 10.0000 mg | Freq: Every day | RECTAL | Status: DC | PRN

## 2017-10-08 MED ORDER — HEPARIN SODIUM (PORCINE) 1000 UNIT/ML IJ SOLN
INTRAMUSCULAR | Status: DC | PRN
  Administered 2017-10-08: 8000 [IU] via INTRAVENOUS

## 2017-10-08 MED ORDER — 0.9 % SODIUM CHLORIDE (POUR BTL) OPTIME
TOPICAL | Status: DC | PRN
  Administered 2017-10-08: 2000 mL

## 2017-10-08 MED ORDER — ACETAMINOPHEN 325 MG PO TABS: 325.0000 mg | ORAL_TABLET | ORAL | Status: DC | PRN

## 2017-10-08 MED ORDER — ASPIRIN EC 81 MG PO TBEC
81.0000 mg | DELAYED_RELEASE_TABLET | Freq: Every day | ORAL | Status: DC
  Administered 2017-10-09 – 2017-10-12 (×4): 81 mg via ORAL
  Filled 2017-10-08 (×4): qty 1

## 2017-10-08 MED ORDER — ROCURONIUM BROMIDE 50 MG/5ML IV SOSY
PREFILLED_SYRINGE | INTRAVENOUS | Status: AC
  Filled 2017-10-08: qty 5

## 2017-10-08 MED ORDER — AMLODIPINE BESYLATE 10 MG PO TABS
10.0000 mg | ORAL_TABLET | Freq: Every day | ORAL | Status: DC
  Administered 2017-10-08 – 2017-10-12 (×5): 10 mg via ORAL
  Filled 2017-10-08 (×5): qty 1

## 2017-10-08 MED ORDER — PROMETHAZINE HCL 25 MG/ML IJ SOLN: 6.2500 mg | INTRAMUSCULAR | Status: DC | PRN

## 2017-10-08 MED ORDER — SODIUM CHLORIDE 0.9 % IV SOLN: 500.0000 mL | Freq: Once | INTRAVENOUS | Status: DC | PRN

## 2017-10-08 MED ORDER — METOPROLOL TARTRATE 5 MG/5ML IV SOLN: 2.0000 mg | INTRAVENOUS | Status: DC | PRN

## 2017-10-08 MED ORDER — ROCURONIUM BROMIDE 10 MG/ML (PF) SYRINGE
PREFILLED_SYRINGE | INTRAVENOUS | Status: DC | PRN
  Administered 2017-10-08 (×2): 50 mg via INTRAVENOUS

## 2017-10-08 MED ORDER — LACTATED RINGERS IV SOLN
INTRAVENOUS | Status: DC | PRN
  Administered 2017-10-08 (×2): via INTRAVENOUS

## 2017-10-08 MED ORDER — SODIUM CHLORIDE 0.9 % IV SOLN: INTRAVENOUS | Status: DC

## 2017-10-08 MED ORDER — ONDANSETRON HCL 4 MG/2ML IJ SOLN
INTRAMUSCULAR | Status: DC | PRN
  Administered 2017-10-08: 4 mg via INTRAVENOUS

## 2017-10-08 MED ORDER — DEXAMETHASONE SODIUM PHOSPHATE 10 MG/ML IJ SOLN
INTRAMUSCULAR | Status: AC
  Filled 2017-10-08: qty 1

## 2017-10-08 MED ORDER — PROTAMINE SULFATE 10 MG/ML IV SOLN
INTRAVENOUS | Status: DC | PRN
  Administered 2017-10-08: 50 mg via INTRAVENOUS

## 2017-10-08 MED ORDER — FENTANYL CITRATE (PF) 100 MCG/2ML IJ SOLN
INTRAMUSCULAR | Status: AC
  Filled 2017-10-08: qty 2

## 2017-10-08 MED ORDER — LABETALOL HCL 5 MG/ML IV SOLN: 10.0000 mg | INTRAVENOUS | Status: DC | PRN

## 2017-10-08 MED ORDER — PHENOL 1.4 % MT LIQD: 1.0000 | OROMUCOSAL | Status: DC | PRN

## 2017-10-08 MED ORDER — FENTANYL CITRATE (PF) 100 MCG/2ML IJ SOLN
25.0000 ug | INTRAMUSCULAR | Status: DC | PRN
  Administered 2017-10-08: 25 ug via INTRAVENOUS
  Administered 2017-10-08: 50 ug via INTRAVENOUS

## 2017-10-08 MED ORDER — ACETAMINOPHEN 325 MG RE SUPP: 325.0000 mg | RECTAL | Status: DC | PRN

## 2017-10-08 MED ORDER — SODIUM CHLORIDE 0.9 % IV SOLN
INTRAVENOUS | Status: DC
  Administered 2017-10-08: 14:00:00 via INTRAVENOUS

## 2017-10-08 MED ORDER — CEFAZOLIN SODIUM-DEXTROSE 2-4 GM/100ML-% IV SOLN
2.0000 g | Freq: Three times a day (TID) | INTRAVENOUS | Status: AC
  Administered 2017-10-08 (×2): 2 g via INTRAVENOUS
  Filled 2017-10-08 (×2): qty 100

## 2017-10-08 MED ORDER — FENTANYL CITRATE (PF) 250 MCG/5ML IJ SOLN
INTRAMUSCULAR | Status: DC | PRN
  Administered 2017-10-08 (×2): 50 ug via INTRAVENOUS
  Administered 2017-10-08: 100 ug via INTRAVENOUS
  Administered 2017-10-08: 50 ug via INTRAVENOUS
  Administered 2017-10-08: 100 ug via INTRAVENOUS

## 2017-10-08 MED ORDER — HYDROCODONE-ACETAMINOPHEN 5-325 MG PO TABS
1.0000 | ORAL_TABLET | ORAL | Status: DC | PRN
  Administered 2017-10-08 – 2017-10-09 (×7): 2 via ORAL
  Administered 2017-10-10: 1 via ORAL
  Administered 2017-10-10 – 2017-10-11 (×5): 2 via ORAL
  Administered 2017-10-11: 1 via ORAL
  Administered 2017-10-11 – 2017-10-12 (×3): 2 via ORAL
  Filled 2017-10-08 (×10): qty 2
  Filled 2017-10-08: qty 1
  Filled 2017-10-08 (×6): qty 2
  Filled 2017-10-08: qty 1

## 2017-10-08 MED ORDER — SODIUM CHLORIDE 0.9 % IV SOLN
INTRAVENOUS | Status: DC | PRN
  Administered 2017-10-08: 500 mL

## 2017-10-08 MED ORDER — MAGNESIUM SULFATE 2 GM/50ML IV SOLN: 2.0000 g | Freq: Every day | INTRAVENOUS | Status: DC | PRN

## 2017-10-08 MED ORDER — PROPOFOL 10 MG/ML IV BOLUS
INTRAVENOUS | Status: DC | PRN
  Administered 2017-10-08: 150 mg via INTRAVENOUS
  Administered 2017-10-08: 180 mg via INTRAVENOUS

## 2017-10-08 MED ORDER — GUAIFENESIN-DM 100-10 MG/5ML PO SYRP: 15.0000 mL | ORAL_SOLUTION | ORAL | Status: DC | PRN

## 2017-10-08 MED ORDER — POTASSIUM CHLORIDE CRYS ER 20 MEQ PO TBCR: 20.0000 meq | EXTENDED_RELEASE_TABLET | Freq: Every day | ORAL | Status: DC | PRN

## 2017-10-08 MED ORDER — GLYCOPYRROLATE PF 0.2 MG/ML IJ SOSY
PREFILLED_SYRINGE | INTRAMUSCULAR | Status: DC | PRN
  Administered 2017-10-08 (×2): .1 mg via INTRAVENOUS

## 2017-10-08 SURGICAL SUPPLY — 61 items
ADH SKN CLS APL DERMABOND .7 (GAUZE/BANDAGES/DRESSINGS) ×3
AGENT HMST SPONGE THK3/8 (HEMOSTASIS)
BANDAGE ESMARK 6X9 LF (GAUZE/BANDAGES/DRESSINGS) IMPLANT
BNDG CMPR 9X6 STRL LF SNTH (GAUZE/BANDAGES/DRESSINGS)
BNDG ESMARK 6X9 LF (GAUZE/BANDAGES/DRESSINGS)
CANISTER SUCT 3000ML PPV (MISCELLANEOUS) ×3 IMPLANT
CANNULA VESSEL 3MM 2 BLNT TIP (CANNULA) IMPLANT
CLIP VESOCCLUDE MED 24/CT (CLIP) ×5 IMPLANT
CLIP VESOCCLUDE SM WIDE 24/CT (CLIP) ×3 IMPLANT
CUFF TOURNIQUET SINGLE 24IN (TOURNIQUET CUFF) IMPLANT
CUFF TOURNIQUET SINGLE 34IN LL (TOURNIQUET CUFF) IMPLANT
CUFF TOURNIQUET SINGLE 44IN (TOURNIQUET CUFF) IMPLANT
DERMABOND ADVANCED (GAUZE/BANDAGES/DRESSINGS) ×6
DERMABOND ADVANCED .7 DNX12 (GAUZE/BANDAGES/DRESSINGS) ×1 IMPLANT
DRAIN CHANNEL 15F RND FF W/TCR (WOUND CARE) IMPLANT
DRAPE C-ARM 42X72 X-RAY (DRAPES) IMPLANT
DRAPE HALF SHEET 40X57 (DRAPES) IMPLANT
DRAPE X-RAY CASS 24X20 (DRAPES) IMPLANT
ELECT REM PT RETURN 9FT ADLT (ELECTROSURGICAL) ×3
ELECTRODE REM PT RTRN 9FT ADLT (ELECTROSURGICAL) ×1 IMPLANT
EVACUATOR SILICONE 100CC (DRAIN) IMPLANT
GAUZE SPONGE 4X4 16PLY XRAY LF (GAUZE/BANDAGES/DRESSINGS) ×3 IMPLANT
GLOVE BIO SURGEON STRL SZ 6.5 (GLOVE) ×4 IMPLANT
GLOVE BIO SURGEON STRL SZ7.5 (GLOVE) ×3 IMPLANT
GLOVE BIO SURGEONS STRL SZ 6.5 (GLOVE) ×4
GLOVE BIOGEL PI IND STRL 6.5 (GLOVE) IMPLANT
GLOVE BIOGEL PI IND STRL 7.0 (GLOVE) ×1 IMPLANT
GLOVE BIOGEL PI INDICATOR 6.5 (GLOVE) ×6
GLOVE BIOGEL PI INDICATOR 7.0 (GLOVE) ×2
GOWN STRL REUS W/ TWL LRG LVL3 (GOWN DISPOSABLE) ×2 IMPLANT
GOWN STRL REUS W/ TWL XL LVL3 (GOWN DISPOSABLE) ×1 IMPLANT
GOWN STRL REUS W/TWL LRG LVL3 (GOWN DISPOSABLE) ×15
GOWN STRL REUS W/TWL XL LVL3 (GOWN DISPOSABLE) ×3
HEMOSTAT SPONGE AVITENE ULTRA (HEMOSTASIS) IMPLANT
INSERT FOGARTY SM (MISCELLANEOUS) IMPLANT
KIT BASIN OR (CUSTOM PROCEDURE TRAY) ×3 IMPLANT
KIT TURNOVER KIT B (KITS) ×3 IMPLANT
MARKER GRAFT CORONARY BYPASS (MISCELLANEOUS) IMPLANT
NS IRRIG 1000ML POUR BTL (IV SOLUTION) ×6 IMPLANT
PACK PERIPHERAL VASCULAR (CUSTOM PROCEDURE TRAY) ×3 IMPLANT
PAD ARMBOARD 7.5X6 YLW CONV (MISCELLANEOUS) ×6 IMPLANT
SET COLLECT BLD 21X3/4 12 (NEEDLE) IMPLANT
STOPCOCK 4 WAY LG BORE MALE ST (IV SETS) IMPLANT
SUT ETHILON 3 0 PS 1 (SUTURE) IMPLANT
SUT GORETEX 6.0 TT13 (SUTURE) IMPLANT
SUT GORETEX 6.0 TT9 (SUTURE) IMPLANT
SUT MNCRL AB 4-0 PS2 18 (SUTURE) ×6 IMPLANT
SUT PROLENE 5 0 C 1 24 (SUTURE) ×9 IMPLANT
SUT PROLENE 6 0 BV (SUTURE) ×12 IMPLANT
SUT PROLENE 7 0 BV 1 (SUTURE) IMPLANT
SUT SILK 2 0 SH (SUTURE) ×3 IMPLANT
SUT SILK 3 0 (SUTURE) ×3
SUT SILK 3-0 18XBRD TIE 12 (SUTURE) IMPLANT
SUT VIC AB 2-0 CT1 27 (SUTURE) ×6
SUT VIC AB 2-0 CT1 TAPERPNT 27 (SUTURE) ×2 IMPLANT
SUT VIC AB 3-0 SH 27 (SUTURE) ×6
SUT VIC AB 3-0 SH 27X BRD (SUTURE) ×2 IMPLANT
TOWEL GREEN STERILE (TOWEL DISPOSABLE) ×3 IMPLANT
TRAY FOLEY MTR SLVR 16FR STAT (SET/KITS/TRAYS/PACK) ×3 IMPLANT
UNDERPAD 30X30 (UNDERPADS AND DIAPERS) ×3 IMPLANT
WATER STERILE IRR 1000ML POUR (IV SOLUTION) ×3 IMPLANT

## 2017-10-08 NOTE — Transfer of Care (Signed)
Immediate Anesthesia Transfer of Care Note  Patient: Joshua Hall  Procedure(s) Performed: Left  FEMORAL-POPLITEAL ARTERY Bypass Graft (Left Leg Upper)  Patient Location: PACU  Anesthesia Type:General  Level of Consciousness: drowsy  Airway & Oxygen Therapy: Patient Spontanous Breathing and Patient connected to nasal cannula oxygen  Post-op Assessment: Report given to RN and Post -op Vital signs reviewed and stable  Post vital signs: Reviewed and stable  Last Vitals:  Vitals Value Taken Time  BP 126/79 10/08/2017 10:40 AM  Temp    Pulse 78 10/08/2017 10:43 AM  Resp 17 10/08/2017 10:43 AM  SpO2 100 % 10/08/2017 10:43 AM  Vitals shown include unvalidated device data.  Last Pain:  Vitals:   10/08/17 0552  TempSrc: Oral  PainSc:          Complications: No apparent anesthesia complications

## 2017-10-08 NOTE — Progress Notes (Signed)
Patient arrived from PACU on a hospital bed, assesement completed see flowsheet, placed on tele ccmd notified, patient oriented to room and staff bed In lowest position, call bell within reach will monitor.

## 2017-10-08 NOTE — Anesthesia Postprocedure Evaluation (Signed)
Anesthesia Post Note  Patient: Joshua Hall  Procedure(s) Performed: Left  FEMORAL-POPLITEAL ARTERY Bypass Graft (Left Leg Upper)     Patient location during evaluation: PACU Anesthesia Type: General Level of consciousness: sedated Pain management: pain level controlled Vital Signs Assessment: post-procedure vital signs reviewed and stable Respiratory status: spontaneous breathing and respiratory function stable Cardiovascular status: stable Postop Assessment: no apparent nausea or vomiting Anesthetic complications: no    Last Vitals:  Vitals:   10/08/17 1125 10/08/17 1140  BP: 131/79 127/81  Pulse: 64 (!) 59  Resp: 17 13  Temp:    SpO2: 96% 96%    Last Pain:  Vitals:   10/08/17 1140  TempSrc:   PainSc: Asleep                 Adewale Pucillo DANIEL

## 2017-10-08 NOTE — Op Note (Signed)
Patient name: Joshua Hall MRN: [id number] DOB: Sep 01, 1955 Sex: male  10/08/2017 Pre-operative Diagnosis: Left lower extremity short distance claudication Post-operative diagnosis:  Same Surgeon:  Erlene Quan C. Donzetta Matters, MD Assistant: Leontine Locket, PA Procedure Performed: 1.  Harvest left greater saphenous vein 2.  Left common femoral to above-knee popliteal artery bypass with non-reversed ipsilateral greater saphenous vein  Indications: 62 year old male recently underwent angiogram for left lower extremity short distance claudication with leg pain that is likely multifactorial nature.  He has marginal vein for bypass on preoperative mapping.  He is indicated for femoral to above-knee popliteal artery bypass with vein versus graft.  Findings: Common femoral artery was free of disease had strong inflow.  The profunda femoris artery comes off high immediately.  There is a small profunda coming off in the usual orientation.  The above-knee popliteal artery is large and also free of disease.  Saphenous vein measured approximately 3 mm diameter does have evidence of some sclerotic vein but it did dilate well.  At completion there is a strong signal posterior tibial that augments with compression of the vein graft.   Procedure:  The patient was identified in the holding area and taken to the operating room where is placed supine the operating table sterilely prepped and draped in the left lower extremity usual fashion given antibiotics and a timeout was called.  We began with a transverse incision above the inguinal crease dissected down to the common femoral artery.  We first identified the aberrant profundal placed a vessel loop around this and then a vessel loop around the common femoral as well as the normally configurated profunda femoris artery.  We then dissected through the same incision identified a saphenous vein which had multiple branching.  We used ultrasound to identify the saphenous vein  throughout the leg made one skip incision and then above the knee made usual longitudinal incision for exposure of the above-knee popliteal artery.  We able to trace the vein and multiple branches and divided them between clips.  Unfortunately the vein was not too robust given that there were really 3 separate saphenous veins.  We did take the largest channel of the 3.  We divided distally between clips.  We then dissected through our skip incision up to the exposure in the groin.  Branches up there were divided between ties we clamped the vein divided and oversewed the saphenofemoral junction with 5-0 Prolene suture in a mattress fashion.  The vein was then prepared on the back table to dilate well and was free of injury.  We dissected out our above-knee popliteal artery and placed a vessel loop around this and tunneled between the groin and above-knee popliteal artery incisions.  Patient was heparinized with 8000 use of heparin.  The common femoral artery was clamped distally and proximally opened longitudinally was noted to be free of disease.  We did test and had very strong inflow.  The vein was then spatulated at its saphenofemoral junction point we did remove the first valve.  We then sewed end-to-side with 5-0 Prolene suture.  Upon completing we had strong flow in the initial part of the vein.  We used a valvulotome to lyse all of our valves and then had very strong inflow.  This was marked for orientation and tunneled.  We then clamped our above-knee popliteal artery distally and proximally opened longitudinally where it was noted to be free of disease.  We spatulated our vein straight in the leg for size  and then trimmed to size.  We started him to side with 6-0 Prolene suture.  Prior to completing anastomosis we allowed flushing all directions.  Upon completion there was a strong palpable pulse popliteal.  There is a good signal in the posterior tibial that augmented with compression of the vein graft.   Patient was given 50 mg of protamine.  We irrigated all the wounds obtained hemostasis closed in layers with Vicryl and Monocryl.  He was allowed away from anesthesia having tolerated procedure well without immediate complication  All counts were correct at completion.  EBL 250 cc per    Erlene Quan C. Donzetta Matters, MD [facility] [place] Office: [phone] Pager: [phone]

## 2017-10-08 NOTE — H&P (Signed)
   History and Physical Update  The patient was interviewed and re-examined.  The patient's previous History and Physical has been reviewed and is unchanged from recent eval. Plan for left fem pop bypass, will likely need graft.    Brandon C. Donzetta Matters, MD [facility] [place] Office: [phone] Pager: [phone]   10/08/2017, 7:23 AM

## 2017-10-08 NOTE — Anesthesia Procedure Notes (Signed)
Procedure Name: Intubation Date/Time: 10/08/2017 7:43 AM Performed by: Valda Favia, CRNA Pre-anesthesia Checklist: Patient identified, Emergency Drugs available, Suction available and Patient being monitored Patient Re-evaluated:Patient Re-evaluated prior to induction Oxygen Delivery Method: Circle System Utilized Preoxygenation: Pre-oxygenation with 100% oxygen Induction Type: IV induction Ventilation: Mask ventilation without difficulty Laryngoscope Size: Mac and 4 Grade View: Grade I Tube type: Oral Tube size: 7.5 mm Number of attempts: 1 Airway Equipment and Method: Stylet and Oral airway Placement Confirmation: ETT inserted through vocal cords under direct vision,  positive ETCO2 and breath sounds checked- equal and bilateral Secured at: 21 cm Tube secured with: Tape Dental Injury: Teeth and Oropharynx as per pre-operative assessment

## 2017-10-08 NOTE — Anesthesia Preprocedure Evaluation (Signed)
Anesthesia Evaluation  Patient identified by MRN, date of birth, ID band Patient awake    Reviewed: Allergy & Precautions, NPO status , Patient's Chart, lab work & pertinent test results  History of Anesthesia Complications Negative for: history of anesthetic complications  Airway Mallampati: II  TM Distance: >3 FB Neck ROM: Full    Dental  (+) Teeth Intact, Dental Advisory Given, Edentulous Upper, Edentulous Lower   Pulmonary COPD (patient denies), Current Smoker, former smoker,    Pulmonary exam normal breath sounds clear to auscultation       Cardiovascular Exercise Tolerance: Good hypertension, Pt. on medications + angina + CAD and + Peripheral Vascular Disease (stent placement to the left SFA)  Normal cardiovascular exam Rhythm:Regular Rate:Normal  11/03/13 Echo: Study Conclusions  - Left ventricle: The cavity size was normal. Wall thickness wasnormal. Systolic function was normal. The estimated ejectionfraction was in the range of 60% to 65%. Wall motion was normal;there were no regional wall motion abnormalities. Leftventricular diastolic function parameters were normal. - Aortic valve: Valve area (VTI): 2.86 cm^2. Valve area (Vmax):2.56 cm^2. - Systemic veins: IVC is small, suggestive of low RA pressure andhypovolemia. - Technically adequate study.  cardiac catheterization in 2013 demonstrating nonobstructive disease of the LAD, with a 99% stenosis to a very small second diagonal branch, too small for intervention, and mild nonobstructive disease of the RCA, the left circumflex was normal.    Neuro/Psych negative neurological ROS  negative psych ROS   GI/Hepatic Neg liver ROS, GERD  Medicated,  Endo/Other  negative endocrine ROS  Renal/GU negative Renal ROS     Musculoskeletal negative musculoskeletal ROS (+)   Abdominal   Peds  Hematology negative hematology ROS (+)   Anesthesia Other Findings Day  of surgery medications reviewed with the patient.  Reproductive/Obstetrics                             Anesthesia Physical  Anesthesia Plan  ASA: III  Anesthesia Plan: General   Post-op Pain Management:    Induction: Intravenous  PONV Risk Score and Plan: 3 and Ondansetron, Dexamethasone and Scopolamine patch - Pre-op  Airway Management Planned: Oral ETT  Additional Equipment:   Intra-op Plan:   Post-operative Plan: Extubation in OR  Informed Consent: I have reviewed the patients History and Physical, chart, labs and discussed the procedure including the risks, benefits and alternatives for the proposed anesthesia with the patient or authorized representative who has indicated his/her understanding and acceptance.   Dental advisory given  Plan Discussed with: CRNA  Anesthesia Plan Comments:         Anesthesia Quick Evaluation

## 2017-10-08 NOTE — Discharge Instructions (Signed)
 [facility] [place]  Discharge instructions  Lower Extremity Bypass Surgery  Please refer to the following instruction for your post-procedure care. Your surgeon or physician assistant will discuss any changes with you.  Activity  You are encouraged to walk as much as you can. You can slowly return to normal activities during the month after your surgery. Avoid strenuous activity and heavy lifting until your doctor tells you it's OK. Avoid activities such as vacuuming or swinging a golf club. Do not drive until your doctor give the OK and you are no longer taking prescription pain medications. It is also normal to have difficulty with sleep habits, eating and bowel movement after surgery. These will go away with time.  Bathing/Showering  Shower daily after you go home. Do not soak in a bathtub, hot tub, or swim until the incision heals completely.  Incision Care  Clean your incision with mild soap and water. Shower every day. Pat the area dry with a clean towel. You do not need a bandage unless otherwise instructed. Do not apply any ointments or creams to your incision. If you have open wounds you will be instructed how to care for them or a visiting nurse may be arranged for you. If you have staples or sutures along your incision they will be removed at your post-op appointment. You may have skin glue on your incision. Do not peel it off. It will come off on its own in about one week.  Wash the groin wound with soap and water daily and pat dry. (No tub bath-only shower)  Then put a dry gauze or washcloth in the groin to keep this area dry to help prevent wound infection.  Do this daily and as needed.  Do not use Vaseline or neosporin on your incisions.  Only use soap and water on your incisions and then protect and keep dry.  Diet  Resume your normal diet. There are no special food restrictions following this procedure. A low fat/ low cholesterol diet is  recommended for all patients with vascular disease. In order to heal from your surgery, it is CRITICAL to get adequate nutrition. Your body requires vitamins, minerals, and protein. Vegetables are the best source of vitamins and minerals. Vegetables also provide the perfect balance of protein. Processed food has little nutritional value, so try to avoid this.  Medications  Resume taking all your medications unless your doctor or physician assistant tells you not to. If your incision is causing pain, you may take over-the-counter pain relievers such as acetaminophen (Tylenol). If you were prescribed a stronger pain medication, please aware these medication can cause nausea and constipation. Prevent nausea by taking the medication with a snack or meal. Avoid constipation by drinking plenty of fluids and eating foods with high amount of fiber, such as fruits, vegetables, and grains. Take Colace 100 mg (an over-the-counter stool softener) twice a day as needed for constipation.  Do not take Tylenol if you are taking prescription pain medications.  Follow Up  Our office will schedule a follow up appointment 2-3 weeks following discharge.  Please call us immediately for any of the following conditions  Severe or worsening pain in your legs or feet while at rest or while walking Increase pain, redness, warmth, or drainage (pus) from your incision site(s) Fever of 101 degree or higher The swelling in your leg with the bypass suddenly worsens and becomes more painful than when you were in the hospital If you have   been instructed to feel your graft pulse then you should do so every day. If you can no longer feel this pulse, call the office immediately. Not all patients are given this instruction.  Leg swelling is common after leg bypass surgery.  The swelling should improve over a few months following surgery. To improve the swelling, you may elevate your legs above the level of your heart while you are  sitting or resting. Your surgeon or physician assistant may ask you to apply an ACE wrap or wear compression (TED) stockings to help to reduce swelling.  Reduce your risk of vascular disease  Stop smoking. If you would like help call [facility] at 1-800-QUIT-NOW (1-800-784-8669) or [place] at 336-586-4000.  Manage your cholesterol Maintain a desired weight Control your diabetes weight Control your diabetes Keep your blood pressure down  If you have any questions, please call the office at [phone]  

## 2017-10-09 ENCOUNTER — Encounter (HOSPITAL_COMMUNITY): Payer: Self-pay | Admitting: Vascular Surgery

## 2017-10-09 ENCOUNTER — Telehealth: Payer: Self-pay | Admitting: Vascular Surgery

## 2017-10-09 ENCOUNTER — Inpatient Hospital Stay (HOSPITAL_COMMUNITY): Payer: Medicaid Other

## 2017-10-09 DIAGNOSIS — Z9889 Other specified postprocedural states: Secondary | ICD-10-CM

## 2017-10-09 LAB — BASIC METABOLIC PANEL
ANION GAP: 6 (ref 5–15)
BUN: 9 mg/dL (ref 6–20)
CHLORIDE: 104 mmol/L (ref 98–111)
CO2: 27 mmol/L (ref 22–32)
Calcium: 8.6 mg/dL — ABNORMAL LOW (ref 8.9–10.3)
Creatinine, Ser: 0.94 mg/dL (ref 0.61–1.24)
GFR calc non Af Amer: >60 mL/min (ref >60)
GLUCOSE: 122 mg/dL — AB (ref 70–99)
POTASSIUM: 3.8 mmol/L (ref 3.5–5.1)
Sodium: 137 mmol/L (ref 135–145)

## 2017-10-09 LAB — CBC
HEMATOCRIT: 29.9 % — AB (ref 39.0–52.0)
HEMOGLOBIN: 10.7 g/dL — AB (ref 13.0–17.0)
MCH: 31.6 pg (ref 26.0–34.0)
MCHC: 35.8 g/dL (ref 30.0–36.0)
MCV: 88.2 fL (ref 78.0–100.0)
Platelets: 203 10*3/uL (ref 150–400)
RBC: 3.39 MIL/uL — ABNORMAL LOW (ref 4.22–5.81)
RDW: 13.1 % (ref 11.5–15.5)
WBC: 12.8 10*3/uL — AB (ref 4.0–10.5)

## 2017-10-09 NOTE — Telephone Encounter (Signed)
sch appt lvm 11/01/17 945am p/o MD

## 2017-10-09 NOTE — Evaluation (Signed)
Occupational Therapy Evaluation Patient Details Name: Joshua Hall MRN: [id number] DOB: 08/30/1955 Today's Date: 10/09/2017    History of Present Illness 62 yo male s/p left fem pop bypass. PMH including COPD, Colitis, HTN, PAD, and Coronary atherosclerosis of native coronary artery   Clinical Impression   PTA, pt was living with a friend and was independent. Currently, pt requires Min Guard-Min A for LB ADLs and Min Guard A for functional mobility using RW. Pt with decreased activity tolerance due to surgical pain, but is highly motivated to participate in therapy and return home. Pt would benefit from further acute OT to facilitate safe dc. Recommend dc to home once medically stable per physician.   Follow Up Recommendations  No OT follow up;Supervision - Intermittent    Equipment Recommendations  3 in 1 bedside commode    Recommendations for Other Services PT consult     Precautions / Restrictions Precautions Precautions: Fall Restrictions Weight Bearing Restrictions: No      Mobility Bed Mobility Overal bed mobility: Modified Independent             General bed mobility comments: Increased time  Transfers Overall transfer level: Needs assistance Equipment used: Rolling walker (2 wheeled) Transfers: Sit to/from Stand Sit to Stand: Min guard         General transfer comment: Min Guard A for safety    Balance Overall balance assessment: Needs assistance Sitting-balance support: No upper extremity supported;Feet supported Sitting balance-Leahy Scale: Good     Standing balance support: No upper extremity supported;During functional activity Standing balance-Leahy Scale: Fair Standing balance comment: Able to maintain static standing without UE support. Benefits from UE support for pain relief                           ADL either performed or assessed with clinical judgement   ADL Overall ADL's : Needs assistance/impaired Eating/Feeding:  Independent;Sitting   Grooming: Min guard;Standing   Upper Body Bathing: Supervision/ safety;Sitting   Lower Body Bathing: Min guard;Sit to/from stand   Upper Body Dressing : Supervision/safety;Standing Upper Body Dressing Details (indicate cue type and reason): donned second gown Lower Body Dressing: Minimal assistance;Sit to/from stand Lower Body Dressing Details (indicate cue type and reason): Min A to initial donning socks. Feel he will progress well with time. Toilet Transfer: Min guard;RW;Ambulation(Simulated to Psychologist, occupational Details (indicate cue type and reason): Min Guard A for safety. Discussed use of 3N1 over toilet for riser         Functional mobility during ADLs: Min guard;Rolling walker General ADL Comments: Pt performing ADLs and funcitonal mobility at supervision-Min Guard A level. Feel he will progress well with time     Vision         Perception     Praxis      Pertinent Vitals/Pain Pain Assessment: 0-10 Pain Score: 6  Pain Location: LLE Pain Descriptors / Indicators: Constant;Discomfort Pain Intervention(s): Monitored during session;Repositioned     Hand Dominance Right   Extremity/Trunk Assessment Upper Extremity Assessment Upper Extremity Assessment: Overall WFL for tasks assessed   Lower Extremity Assessment Lower Extremity Assessment: LLE deficits/detail LLE Deficits / Details: s/p left fem pop bypass LLE Coordination: decreased gross motor   Cervical / Trunk Assessment Cervical / Trunk Assessment: Normal   Communication Communication Communication: No difficulties   Cognition Arousal/Alertness: Awake/alert Behavior During Therapy: WFL for tasks assessed/performed Overall Cognitive Status: Within Functional Limits for tasks assessed  General Comments  VSS    Exercises     Shoulder Instructions      Home Living Family/patient expects to be discharged to:: Private  residence Living Arrangements: Non-relatives/Friends Available Help at Discharge: Friend(s);Available PRN/intermittently Type of Home: House Home Access: Stairs to enter Entrance Stairs-Number of Steps: 3 Entrance Stairs-Rails: Left Home Layout: Laundry or work area in basement;Able to live on main level with bedroom/bathroom     Bathroom Shower/Tub: Tub/shower unit;Curtain;Walk-in Psychologist, prison and probation services: Standard     Home Equipment: Shower seat - built in          Prior Functioning/Environment Level of Independence: Independent        Comments: ADLs, IADLs, [place]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." OT Goal Formulation: With patient Time For Goal Achievement: 10/23/17 Potential to Achieve Goals: Good ADL Goals Pt Will Perform Lower Body Dressing: with modified independence;sit to/from stand Pt Will Transfer to Toilet: with modified independence;bedside commode;ambulating;regular height toilet Pt Will Perform Toileting - Clothing Manipulation and hygiene: with modified independence;sit to/from stand Pt Will Perform Tub/Shower Transfer: Shower transfer;ambulating;shower seat;rolling walker;with modified independence  OT Frequency: Min 3X/week   Barriers to D/C:            Co-evaluation              AM-PAC PT "6 Clicks" Daily Activity     Outcome Measure Help from another person eating meals?: None Help from  another person taking care of personal grooming?: A Little Help from another person toileting, which includes using toliet, bedpan, or urinal?: A Little Help from another person bathing (including washing, rinsing, drying)?: A Little Help from another person to put on and taking off regular upper body clothing?: None Help from another person to put on and taking off regular lower body clothing?: A Little 6 Click Score: 20   End of Session Equipment Utilized During Treatment: Rolling walker Nurse Communication: Mobility status  Activity Tolerance: Patient tolerated treatment well Patient left: in chair;with call bell/phone within reach  OT Visit Diagnosis: Unsteadiness on feet (R26.81);Other abnormalities of gait and mobility (R26.89);Muscle weakness (generalized) (M62.81);Pain Pain - Right/Left: Left Pain - part of body: Leg                Time: 0623-7628 OT Time Calculation (min): 15 min Charges:  OT General Charges $OT Visit: 1 Visit OT Evaluation $OT Eval Low Complexity: 1 Low  Ilhan Madan MSOT, OTR/L Acute Rehab Pager: [phone] Office: [place] 10/09/2017, 8:41 AM

## 2017-10-09 NOTE — Progress Notes (Signed)
VASCULAR LAB PRELIMINARY  ARTERIAL  ABI completed:    RIGHT    LEFT    PRESSURE WAVEFORM  PRESSURE WAVEFORM  BRACHIAL 120 triphasic BRACHIAL 109 triphasic  DP 99  DP 90          PT 126 triphasic PT 100 triphasic         GREAT TOE 92 NA GREAT TOE 51 NA    RIGHT LEFT  ABI 1.05 0.83   Rt ABI is within normal range.  Lt ABI indicates mild lower arterial disease.  Joshua Hall 10/09/2017, 11:55 AM

## 2017-10-09 NOTE — Progress Notes (Addendum)
  Progress Note    10/09/2017 7:13 AM 1 Day Post-Op  Subjective:  Says he feels sore in his leg.  Afebrile HR 50's-100's  423'N-361'W systolic 43% RA  Vitals:   10/09/17 0347 10/09/17 0605  BP: 104/61 107/64  Pulse: (!) 56   Resp: 14 19  Temp:  98.1 F (36.7 C)  SpO2: 98% 100%    Physical Exam: Cardiac:  regular Lungs:  Non labored Incisions:  All incisions are clean and dry Extremities:  Easily palpable left PT pulse   CBC    Component Value Date/Time   WBC 12.8 (H) 10/09/2017 0336   RBC 3.39 (L) 10/09/2017 0336   HGB 10.7 (L) 10/09/2017 0336   HGB 14.9 09/03/2017 1111   HCT 29.9 (L) 10/09/2017 0336   HCT 43.5 09/03/2017 1111   PLT 203 10/09/2017 0336   PLT 310 09/03/2017 1111   MCV 88.2 10/09/2017 0336   MCV 90 09/03/2017 1111   MCH 31.6 10/09/2017 0336   MCHC 35.8 10/09/2017 0336   RDW 13.1 10/09/2017 0336   RDW 13.9 09/03/2017 1111   LYMPHSABS 2.4 12/16/2013 2200   MONOABS 0.7 12/16/2013 2200   EOSABS 0.3 12/16/2013 2200   BASOSABS 0.0 12/16/2013 2200    BMET    Component Value Date/Time   NA 137 10/09/2017 0336   NA 140 09/03/2017 1111   K 3.8 10/09/2017 0336   CL 104 10/09/2017 0336   CO2 27 10/09/2017 0336   GLUCOSE 122 (H) 10/09/2017 0336   BUN 9 10/09/2017 0336   BUN 8 09/03/2017 1111   CREATININE 0.94 10/09/2017 0336   CALCIUM 8.6 (L) 10/09/2017 0336   GFRNONAA >60 10/09/2017 0336   GFRAA >60 10/09/2017 0336    INR    Component Value Date/Time   INR 0.99 10/03/2017 1355     Intake/Output Summary (Last 24 hours) at 10/09/2017 0713 Last data filed at 10/09/2017 1540 Gross per 24 hour  Intake 3295.15 ml  Output 1250 ml  Net 2045.15 ml     Assessment:  62 y.o. male is s/p:  1.  Harvest left greater saphenous vein 2.  Left common femoral to above-knee popliteal artery bypass with non-reversed ipsilateral greater saphenous vein  1 Day Post-Op  Plan: -pt doing well this am with palpable left PT pulse -will mobilize oob  today -acute surgical blood loss anemia-pt tolerating -thrombocytopenia-plts down to 203k this am from 227k-will check again tomorrow. -foley was removed this morning -DVT prophylaxis:  Sq heparin to start today -transfer to tele status   Leontine Locket, PA-C [facility] [phone] 10/09/2017 7:13 AM  I have independently interviewed and examined patient and agree with PA assessment and plan above.   Ryszard Socarras C. Donzetta Matters, MD [facility] [place] Office: [phone] Pager: [phone]

## 2017-10-09 NOTE — Progress Notes (Signed)
PT Cancellation Note  Patient Details Name: ZINEDINE ELLNER MRN: [id number] DOB: 1955/12/26   Cancelled Treatment:    Reason Eval/Treat Not Completed: Patient at procedure or test/unavailable(Pt in vascular lab.  will return as able.  Thanks.)   Denice Paradise 10/09/2017, 11:31 AM  [facility] [phone]

## 2017-10-09 NOTE — Progress Notes (Signed)
Physical Therapy Evaluation Patient Details Name: Joshua Hall MRN: [id number] DOB: 1955/11/27 Today's Date: 10/09/2017   History of Present Illness  62 yo male s/p left fem pop bypass. PMH including COPD, Colitis, HTN, PAD, and Coronary atherosclerosis of native coronary artery  Clinical Impression  Patient is s/p above procedure resulting in deficits listed below (see PT Problem List). At the time of evaluation pt performed ambulation with gross min g, utilizing RW for safety. Pt educated on car transfers, positioning, and importance of having RW in front of pt before mobilizing as pt demonstrated decreased safety awareness throughout session. Per pt request, pt would benefit from at least one more PT session before d/c. Will continue to progress pt as able per POC to allow discharge to the venue listed below.       Follow Up Recommendations No PT follow up;Supervision for mobility/OOB    Equipment Recommendations  Rolling walker with 5" wheels    Recommendations for Other Services       Precautions / Restrictions Precautions Precautions: Fall Restrictions Weight Bearing Restrictions: No      Mobility  Bed Mobility Overal bed mobility: Modified Independent             General bed mobility comments: HOB lowered; Mod I for increased time   Transfers Overall transfer level: Needs assistance Equipment used: Rolling walker (2 wheeled) Transfers: Sit to/from Stand Sit to Stand: Supervision         General transfer comment: Supervision for safety; min cues for safe hand placement and controlling descent to chair  Ambulation/Gait Ambulation/Gait assistance: Min guard(chair follow ) Gait Distance (Feet): 200 Feet Assistive device: Rolling walker (2 wheeled) Gait Pattern/deviations: Step-to pattern;Decreased step length - left;Decreased stance time - left;Decreased weight shift to left;Antalgic Gait velocity: decreased Gait velocity interpretation: <1.31 ft/sec,  indicative of household ambulator General Gait Details: pt slow and guarded requiring min g for safety; Verbal and tactile cues required for upright posture  Stairs Stairs: Yes Stairs assistance: Min guard Stair Management: One rail Left;Sideways;Step to pattern Number of Stairs: 10 General stair comments: Pt ascended with non-operative LE leading sideways and descended with operative leg leading sideways with Bil UE support on Left rail; min cues required to improve foot position of LLE in order for RLE to descend to same step and to increase UE support when shifting weight onto LLE  Wheelchair Mobility    Modified Rankin (Stroke Patients Only)       Balance Overall balance assessment: Needs assistance Sitting-balance support: No upper extremity supported;Feet supported Sitting balance-Leahy Scale: Good     Standing balance support: No upper extremity supported;Bilateral upper extremity supported Standing balance-Leahy Scale: Fair Standing balance comment: No UE support required for static standing activities; BIL UE support required for dynamic standing activities                              Pertinent Vitals/Pain Pain Assessment: 0-10 Pain Score: 7  Pain Location: LLE Pain Descriptors / Indicators: Guarding;Grimacing;Discomfort;Sore Pain Intervention(s): Limited activity within patient's tolerance;Monitored during session;Repositioned    Home Living Family/patient expects to be discharged to:: Private residence Living Arrangements: Non-relatives/Friends Available Help at Discharge: Friend(s);Available PRN/intermittently Type of Home: House Home Access: Stairs to enter Entrance Stairs-Rails: Left Entrance Stairs-Number of Steps: 3 Home Layout: Laundry or work area in basement;Able to live on main level with bedroom/bathroom        Prior Function Level of Independence:  Independent               Hand Dominance   Dominant Hand: Right     Extremity/Trunk Assessment   Upper Extremity Assessment Upper Extremity Assessment: Defer to OT evaluation    Lower Extremity Assessment Lower Extremity Assessment: LLE deficits/detail LLE Deficits / Details: s/p left fem pop bypass    Cervical / Trunk Assessment Cervical / Trunk Assessment: Normal  Communication   Communication: No difficulties  Cognition Arousal/Alertness: Awake/alert Behavior During Therapy: WFL for tasks assessed/performed Overall Cognitive Status: Within Functional Limits for tasks assessed                                        General Comments      Exercises     Assessment/Plan    PT Assessment Patient needs continued PT services  PT Problem List Decreased strength;Decreased range of motion;Decreased activity tolerance;Decreased balance;Decreased mobility;Decreased coordination;Decreased knowledge of use of DME;Decreased safety awareness;Pain       PT Treatment Interventions DME instruction;Gait training;Stair training;Functional mobility training;Therapeutic activities;Therapeutic exercise;Balance training;Patient/family education    PT Goals (Current goals can be found in the Care Plan section)  Acute Rehab PT Goals 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 : None Difficulty sitting down on and standing up from a chair with arms (e.g., wheelchair, bedside commode, etc,.)?: A Little Help needed moving to and from a bed to chair (including a wheelchair)?: A Little Help needed walking in hospital  room?: A Little Help needed climbing 3-5 steps with a railing? : A Little 6 Click Score: 20    End of Session Equipment Utilized During Treatment: Gait belt Activity Tolerance: Patient tolerated treatment well Patient left: in chair;with call bell/phone within reach;with nursing/sitter in room Nurse Communication: Mobility status PT Visit Diagnosis: Other abnormalities of gait and mobility (R26.89);Difficulty in walking, not elsewhere classified (R26.2);Pain Pain - Right/Left: Left Pain - part of body: Leg    Time: 1357-1419 PT Time Calculation (min) (ACUTE ONLY): 22 min   Charges:   PT Evaluation $PT Eval Moderate Complexity: 1 Mod          Einar Crow, [place]  Student Physical Therapist Acute Rehab [phone]   Einar Crow 10/09/2017, 2:54 PM

## 2017-10-10 ENCOUNTER — Other Ambulatory Visit: Payer: Self-pay

## 2017-10-10 ENCOUNTER — Encounter (HOSPITAL_COMMUNITY): Payer: Self-pay | Admitting: General Practice

## 2017-10-10 LAB — CBC
HCT: 29 % — ABNORMAL LOW (ref 39.0–52.0)
HEMOGLOBIN: 10.1 g/dL — AB (ref 13.0–17.0)
MCH: 30.8 pg (ref 26.0–34.0)
MCHC: 34.8 g/dL (ref 30.0–36.0)
MCV: 88.4 fL (ref 78.0–100.0)
PLATELETS: 204 10*3/uL (ref 150–400)
RBC: 3.28 MIL/uL — ABNORMAL LOW (ref 4.22–5.81)
RDW: 13.1 % (ref 11.5–15.5)
WBC: 7.7 10*3/uL (ref 4.0–10.5)

## 2017-10-10 NOTE — Progress Notes (Signed)
Physical Therapy Treatment Patient Details Name: Joshua Hall MRN: [id number] DOB: 1956/02/10 Today's Date: 10/10/2017    History of Present Illness 62 yo male s/p left fem pop bypass. PMH including COPD, Colitis, HTN, PAD, and Coronary atherosclerosis of native coronary artery    PT Comments    Patient progressing with therapy, increasing ambulation distance with less assistance. Cues for improving gait mechanics pt able to demonstrate good cary over. Will plan to do stairs once more before safe return home when medically ready for d/c.     Follow Up Recommendations  No PT follow up;Supervision for mobility/OOB     Equipment Recommendations  Rolling walker with 5" wheels    Recommendations for Other Services       Precautions / Restrictions Precautions Precautions: Fall Restrictions Weight Bearing Restrictions: No    Mobility  Bed Mobility Overal bed mobility: Modified Independent             General bed mobility comments: HOB lowered; Mod I for increased time   Transfers Overall transfer level: Needs assistance Equipment used: Rolling walker (2 wheeled) Transfers: Sit to/from Stand Sit to Stand: Supervision            Ambulation/Gait Ambulation/Gait assistance: Supervision Gait Distance (Feet): 200 Feet Assistive device: Rolling walker (2 wheeled) Gait Pattern/deviations: Step-to pattern;Decreased step length - left;Decreased stance time - left;Decreased weight shift to left;Antalgic     General Gait Details: pt with increased velocity and confidence, supervision level for ambulation today    Stairs             Wheelchair Mobility    Modified Rankin (Stroke Patients Only)       Balance Overall balance assessment: Needs assistance Sitting-balance support: No upper extremity supported;Feet supported Sitting balance-Leahy Scale: Good     Standing balance support: No upper extremity supported;Bilateral upper extremity  supported Standing balance-Leahy Scale: Fair Standing balance comment: No UE support required for static standing activities; BIL UE support required for dynamic standing activities                             Cognition Arousal/Alertness: Awake/alert Behavior During Therapy: WFL for tasks assessed/performed Overall Cognitive Status: Within Functional Limits for tasks assessed                                        Exercises      General Comments        Pertinent Vitals/Pain Pain Assessment: 0-10 Pain Score: 7  Pain Location: LLE Pain Descriptors / Indicators: Guarding;Grimacing;Discomfort;Sore Pain Intervention(s): Limited activity within patient's tolerance;Monitored during session;Premedicated before session    Home Living                      Prior Function            PT Goals (current goals can now be found in the care plan section) Acute Rehab PT Goals 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 : None Difficulty sitting down on and standing up from a chair with arms (e.g., wheelchair, bedside commode, etc,.)?: A Little Help needed moving to and from a bed to chair (including a wheelchair)?: A Little Help needed walking in hospital room?: A Little Help needed climbing 3-5 steps with a railing? : A Little 6 Click Score: 20    End of Session Equipment Utilized During Treatment: Gait belt Activity Tolerance: Patient  tolerated treatment well Patient left: in chair;with call bell/phone within reach;with nursing/sitter in room Nurse Communication: Mobility status PT Visit Diagnosis: Other abnormalities of gait and mobility (R26.89);Difficulty in walking, not elsewhere classified (R26.2);Pain Pain - Right/Left: Left Pain - part of body: Leg     Time: 9379-0240 PT Time Calculation (min) (ACUTE ONLY): 16 min  Charges:  $Gait Training: 8-22 mins           Reinaldo Berber, PT, DPT Acute Rehab Services Pager: [phone]    Reinaldo Berber 10/10/2017, 4:26 PM

## 2017-10-10 NOTE — Progress Notes (Addendum)
[facility] well, still having discomfort surrounding his incisions with mobility.   Objective (!) 101/51 65 98 F (36.7 C) (Oral) 19 99%  Intake/Output Summary (Last 24 hours) at 10/10/2017 0956 Last data filed at 10/10/2017 0857 Gross per 24 hour  Intake 240 ml  Output 1575 ml  Net -1335 ml    Left groin soft without hematoma, thigh incision healing well.  No drainage. Doppler PT/DP/Peroneal,  With palpable PT pulse on the left LE. Heart RRR Lungs non labored breathing Gen when at rest NAD  Assessment/Planning: POD # 2  1.Harvest left greater saphenous vein 2.Left common femoral to above-knee popliteal artery bypass with non-reversed ipsilateral greater saphenous vein   Continue low PLT with slight increase, HGB stable 10.1 asymptomatic Will re check CBC tomorrow.   Encourage mobility no PT follow up recommended will order rolling walker for home. Possible discharge tomorrow if pain controlled and safe mobility.   Roxy Horseman 10/10/2017 9:56 AM --  Laboratory Lab Results: Recent Labs    10/09/17 0336 10/10/17 0330  WBC 12.8* 7.7  HGB 10.7* 10.1*  HCT 29.9* 29.0*  PLT 203 204   BMET Recent Labs    10/08/17 1450 10/09/17 0336  NA  --  137  K  --  3.8  CL  --  104  CO2  --  27  GLUCOSE  --  122*  BUN  --  9  CREATININE 1.04 0.94  CALCIUM  --  8.6*    COAG Lab Results  Component Value Date   INR 0.99 10/03/2017   INR 0.86 04/30/2017   INR 0.8 12/15/2013   No results found for: PTT  I have independently interviewed and examined patient and agree with PA assessment and plan above.  Plan for likely discharge tomorrow.  Lorraine Terriquez C. Donzetta Matters, MD [facility] Office: [phone] Pager: [phone]

## 2017-10-10 NOTE — Progress Notes (Signed)
Occupational Therapy Treatment and Discharge Patient Details Name: Joshua Hall MRN: [id number] DOB: Jul 30, 1955 Today's Date: 10/10/2017    History of present illness 62 yo male s/p left fem pop bypass. PMH including COPD, Colitis, HTN, PAD, and Coronary atherosclerosis of native coronary artery   OT comments  This 62 yo male admitted and underwent above presents to acute OT at an overall S level and will have this intermittently at home. Feel he will progress quickly to a Mod I to independent level. No further OT needs, we will sign off. Pt is aware he needs to ask surgeon about when he can shower.  Follow Up Recommendations  No OT follow up;Supervision - Intermittent    Equipment Recommendations  None recommended by OT       Precautions / Restrictions Precautions Precautions: Fall Restrictions Weight Bearing Restrictions: No       Mobility Bed Mobility Overal bed mobility: Modified Independent             General bed mobility comments:  Mod I for increased time   Transfers Overall transfer level: Needs assistance Equipment used: Rolling walker (2 wheeled) Transfers: Sit to/from Stand Sit to Stand: Supervision              Balance Overall balance assessment: Needs assistance Sitting-balance support: No upper extremity supported;Feet supported Sitting balance-Leahy Scale: Good     Standing balance support: No upper extremity supported;Bilateral upper extremity supported Standing balance-Leahy Scale: Fair Standing balance comment: No UE support required for static standing activities; BIL UE support required for dynamic standing activities                            ADL either performed or assessed with clinical judgement   ADL Overall ADL's : Needs assistance/impaired                     Lower Body Dressing: Set up;Supervision/safety;Sit to/from stand   Toilet Transfer: Supervision/safety;Ambulation;RW;Regular Toilet;Grab bars                    Vision Patient Visual Report: No change from baseline            Cognition Arousal/Alertness: Awake/alert Behavior During Therapy: WFL for tasks assessed/performed Overall Cognitive Status: Within Functional Limits for tasks assessed                                                     Pertinent Vitals/ Pain       Pain Assessment: 0-10 Pain Score: 9  Pain Location: LLE Pain Descriptors / Indicators: Guarding;Grimacing;Discomfort;Sore Pain Intervention(s): Limited activity within patient's tolerance;Monitored during session;Repositioned;Patient requesting pain meds-RN notified            Progress Toward Goals  OT Goals(current goals can now be found in the care plan section)  Progress towards OT goals: (All education completed)  Acute Rehab OT Goals 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."  Plan Discharge plan remains appropriate;Equipment recommendations need to be updated       AM-PAC PT "6 Clicks" Daily Activity     Outcome Measure   Help from another person eating meals?: None Help from another person taking care of personal grooming?: A Little Help from another person toileting, which  includes using toliet, bedpan, or urinal?: A Little Help from another person bathing (including washing, rinsing, drying)?: A Little Help from another person to put on and taking off regular upper body clothing?: A Little Help from another person to put on and taking off regular lower body clothing?: A Little 6 Click Score: 19    End of Session Equipment Utilized During Treatment: Rolling walker  OT Visit Diagnosis: Unsteadiness on feet (R26.81);Other abnormalities of gait and mobility (R26.89);Pain Pain - Right/Left: Left Pain - part of body: Leg   Activity Tolerance Patient tolerated treatment well   Patient Left in bed;with call bell/phone within reach   Nurse Communication Patient requests pain meds         Time: 1287-8676 OT Time Calculation (min): 9 min  Charges: OT General Charges $OT Visit: 1 Visit OT Treatments $Self Care/Home Management : 8-22 mins  [address], OTR/L [place] Pager [phone] Office [phone]

## 2017-10-11 MED ORDER — HYDROCODONE-ACETAMINOPHEN 5-325 MG PO TABS: 1.0000 | ORAL_TABLET | Freq: Four times a day (QID) | ORAL | 0 refills | Status: DC | PRN

## 2017-10-11 NOTE — Care Management Note (Signed)
Case Management Note Marvetta Gibbons RN, BSN Unit [address]E- RN Care Coordinator  [phone]  Patient Details  Name: Joshua Hall MRN: [id number] Date of Birth: 1955-04-18  Subjective/Objective:    Pt admitted  S/p fem-pop bypass               Action/Plan: PTA pt lived at home per PT/OT evals no recommendations for f/u, pt would like RW for transition home, order has been placed- will ask [facility] to deliver DME prior to discharge. PCP- Allyn Kenner.   Expected Discharge Date:                  Expected Discharge Plan:  Home/Self Care  In-House Referral:     Discharge planning Services  CM Consult  Post Acute Care Choice:  Durable Medical Equipment Choice offered to:  Patient  DME Arranged:  Walker rolling DME Agency:  [place]:    [place] Agency:     Status of Service:  In process, will continue to follow  If discussed at Long Length of Stay Meetings, dates discussed:    Discharge Disposition: home/self care   Additional Comments:  Dawayne Patricia, RN 10/11/2017, 10:51 AM

## 2017-10-11 NOTE — Progress Notes (Signed)
Physical Therapy Treatment Patient Details Name: Joshua Hall MRN: [id number] DOB: [birth year] Today's Date: 10/11/2017    History of Present Illness 62 yo male s/p left fem pop bypass. PMH including COPD, Colitis, HTN, PAD, and Coronary atherosclerosis of native coronary artery    PT Comments    Patient progressing well with therapy, increasing gait mechanics and velocity. Also focused on stairs this session, pt ambulating them safely with supervision. Pt plans to d/c tomorrow if medically ready, no concerns from PT standpoint.    Follow Up Recommendations  No PT follow up;Supervision for mobility/OOB     Equipment Recommendations  Rolling walker with 5" wheels    Recommendations for Other Services       Precautions / Restrictions Precautions Precautions: Fall Restrictions Weight Bearing Restrictions: No    Mobility  Bed Mobility Overal bed mobility: Modified Independent                Transfers Overall transfer level: Modified independent Equipment used: Rolling walker (2 wheeled) Transfers: Sit to/from Stand Sit to Stand: Supervision            Ambulation/Gait Ambulation/Gait assistance: Supervision Gait Distance (Feet): 200 Feet Assistive device: Rolling walker (2 wheeled) Gait Pattern/deviations: Step-to pattern;Decreased step length - left;Decreased stance time - left;Decreased weight shift to left;Antalgic Gait velocity: decreased   General Gait Details: pt with good cary over from prior PT sessoin with step through gait   Stairs   Stairs assistance: Min guard Stair Management: One rail Left;Sideways;Step to pattern   General stair comments: cues for sequencing, pt ambulating 8 steps today before walking 200'   Wheelchair Mobility    Modified Rankin (Stroke Patients Only)       Balance Overall balance assessment: Needs assistance   Sitting balance-Leahy Scale: Good     Standing balance support: No upper extremity  supported;Bilateral upper extremity supported Standing balance-Leahy Scale: Fair Standing balance comment: No UE support required for static standing activities; BIL UE support required for dynamic standing activities                             Cognition Arousal/Alertness: Awake/alert Behavior During Therapy: WFL for tasks assessed/performed Overall Cognitive Status: Within Functional Limits for tasks assessed                                        Exercises      General Comments        Pertinent Vitals/Pain Pain Assessment: Faces Faces Pain Scale: Hurts little more Pain Location: LLE Pain Descriptors / Indicators: Guarding;Grimacing;Discomfort;Sore Pain Intervention(s): Limited activity within patient's tolerance;Monitored during session;Premedicated before session    Home Living                      Prior Function            PT Goals (current goals can now be found in the care plan section) Acute Rehab PT Goals 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 : None Difficulty sitting down on and standing up from a chair with arms (e.g., wheelchair, bedside commode, etc,.)?: A Little Help needed moving to and from a bed to chair (including a wheelchair)?: A Little Help needed walking in hospital room?: A Little Help needed climbing 3-5 steps with a railing? : A Little 6 Click Score: 20    End of Session Equipment Utilized  During Treatment: Gait belt Activity Tolerance: Patient tolerated treatment well Patient left: in chair;with call bell/phone within reach;with nursing/sitter in room Nurse Communication: Mobility status PT Visit Diagnosis: Other abnormalities of gait and mobility (R26.89);Difficulty in walking, not elsewhere classified (R26.2);Pain Pain - Right/Left: Left Pain - part of body: Leg     Time: 1520-1535 PT Time Calculation (min) (ACUTE ONLY): 15 min  Charges:  $Gait Training: 8-22 mins                    Reinaldo Berber, PT, DPT Acute Rehab Services Pager: [phone]     Reinaldo Berber 10/11/2017, 3:54 PM

## 2017-10-11 NOTE — Progress Notes (Addendum)
  Progress Note    10/11/2017 7:17 AM 3 Days Post-Op  Subjective:  Concerned about a knot in his groin and burning in his groin when he walks.    Afebrile HR 50's-80's  209'O-709'G systolic 28% RA  Vitals:   10/10/17 2010 10/11/17 0413  BP: 114/64 125/65  Pulse:    Resp: 19 19  Temp: 98.4 F (36.9 C) 98.4 F (36.9 C)  SpO2: 96% 96%    Physical Exam: General:  No distress Lungs:  Non labored Incisions:  All incisions are clean and dry; small hematoma left groin Extremities:  Left foot is warm and well perfused.    CBC    Component Value Date/Time   WBC 7.7 10/10/2017 0330   RBC 3.28 (L) 10/10/2017 0330   HGB 10.1 (L) 10/10/2017 0330   HGB 14.9 09/03/2017 1111   HCT 29.0 (L) 10/10/2017 0330   HCT 43.5 09/03/2017 1111   PLT 204 10/10/2017 0330   PLT 310 09/03/2017 1111   MCV 88.4 10/10/2017 0330   MCV 90 09/03/2017 1111   MCH 30.8 10/10/2017 0330   MCHC 34.8 10/10/2017 0330   RDW 13.1 10/10/2017 0330   RDW 13.9 09/03/2017 1111   LYMPHSABS 2.4 12/16/2013 2200   MONOABS 0.7 12/16/2013 2200   EOSABS 0.3 12/16/2013 2200   BASOSABS 0.0 12/16/2013 2200    BMET    Component Value Date/Time   NA 137 10/09/2017 0336   NA 140 09/03/2017 1111   K 3.8 10/09/2017 0336   CL 104 10/09/2017 0336   CO2 27 10/09/2017 0336   GLUCOSE 122 (H) 10/09/2017 0336   BUN 9 10/09/2017 0336   BUN 8 09/03/2017 1111   CREATININE 0.94 10/09/2017 0336   CALCIUM 8.6 (L) 10/09/2017 0336   GFRNONAA >60 10/09/2017 0336   GFRAA >60 10/09/2017 0336    INR    Component Value Date/Time   INR 0.99 10/03/2017 1355     Intake/Output Summary (Last 24 hours) at 10/11/2017 0717 Last data filed at 10/10/2017 1634 Gross per 24 hour  Intake -  Output 700 ml  Net -700 ml     Assessment:  62 y.o. male is s/p:  1.Harvest left greater saphenous vein 2.Left common femoral to above-knee popliteal artery bypass with non-reversed ipsilateral greater saphenous vein  3 Days  Post-Op   Plan: -pt left foot warm and well perfused -small hematoma left groin-discussed with pt that this will resolve over time.  Discussed with pt that if it gets bigger to contact us. -mobilize more today and anticipate home tomorrow. -DVT prophylaxis:  Sq heparin    Leontine Locket, PA-C [facility] [phone] 10/11/2017 7:17 AM  I have independently interviewed and examined patient and agree with PA assessment and plan above.  Plan is for discharge tomorrow.  Myeisha Kruser C. Donzetta Matters, MD [facility] [place] Office: [phone] Pager: [phone]

## 2017-10-12 NOTE — Care Management (Signed)
Pt has not received RW.  Reggie with [facility] contacted and will deliver to room ASAP.

## 2017-10-12 NOTE — Plan of Care (Signed)
Patient is adequate for discharge.  

## 2017-10-12 NOTE — Plan of Care (Signed)
Care plans reviewed and patient is progressing.  

## 2017-10-12 NOTE — Progress Notes (Addendum)
[facility] [place]  Subjective  - Doing well over all, concerned about left groin swelling.  It has not changed over the past few days.   Objective 110/66 65 98.1 F (36.7 C) (Oral) 13 98%  Intake/Output Summary (Last 24 hours) at 10/12/2017 0370 Last data filed at 10/11/2017 2200 Gross per 24 hour  Intake 600 ml  Output -  Net 600 ml    Left groin soft with small non expanding hematoma. Left leg thigh incisions healing well. Left LE sensation intact, palpable DP Heart RRR Lungs non labored breathing   ABI Findings: +---------+------------------+-----+---------+--------+ Right  Rt Pressure (mmHg)IndexWaveform Comment  +---------+------------------+-----+---------+--------+ Brachial 120           triphasic     +---------+------------------+-----+---------+--------+ PTA   126        1.05 triphasic     +---------+------------------+-----+---------+--------+ DP    99        0.82 triphasic     +---------+------------------+-----+---------+--------+ Great Toe92        0.77           +---------+------------------+-----+---------+--------+  +---------+------------------+-----+---------+-------+ Left   Lt Pressure (mmHg)IndexWaveform Comment +---------+------------------+-----+---------+-------+ Brachial 109           triphasic     +---------+------------------+-----+---------+-------+ PTA   100        0.83 triphasic     +---------+------------------+-----+---------+-------+ DP    90        0.75 triphasic     +---------+------------------+-----+---------+-------+ Great Toe51        0.43           +---------+------------------+-----+---------+-------+  +-------+-----------+-----------+------------+------------+ ABI/TBIToday's ABIToday's TBIPrevious ABIPrevious  TBI +-------+-----------+-----------+------------+------------+ Right 1.05    0.77                 +-------+-----------+-----------+------------+------------+ Left  0.83    0.43                 +-------+-----------+-----------+------------+------------+ Assessment/Planning: POD # 4 left fem-pop above knee  By pass  No HH recommended, DME rolling walker Disposition stable for discharge. I reassured him that the hematoma does not appear to be expanding and his body will reabsorb it over time. D/C home today will f/u in 2-3 weeks with Dr. Donzetta Matters.    Roxy Horseman 10/12/2017 7:21 AM --  left foot warm Left groin swelling about 3-4 cm diameter apparently unchanged D/c home  Ruta Hinds, MD [facility] [place]: [phone] Pager: [phone]  Laboratory Lab Results: Recent Labs    10/10/17 0330  WBC 7.7  HGB 10.1*  HCT 29.0*  PLT 204   BMET No results for input(s): NA, K, CL, CO2, GLUCOSE, BUN, CREATININE, CALCIUM in the last 72 hours.  COAG Lab Results  Component Value Date   INR 0.99 10/03/2017   INR 0.86 04/30/2017   INR 0.8 12/15/2013   No results found for: PTT

## 2017-10-12 NOTE — Progress Notes (Signed)
Discharge instructions given.  Discussed new medications, medication changes and side effects.  Discussed follow up appointments and signs and symptoms to watch for and when to contact his physician.  Verbalized understanding

## 2017-10-14 NOTE — Discharge Summary (Signed)
[facility] Discharge Summary   Patient ID:  Joshua Hall MRN: [id number] DOB/AGE: 60-03-1955 60 y.o.  Admit date: 10/08/2017 Discharge date: 10/12/2017 Date of Surgery: 10/08/2017 Surgeon: Surgeon(s): Waynetta Sandy, MD  Admission Diagnosis: left leg claudication  Discharge Diagnoses:  left leg claudication  Secondary Diagnoses: Past Medical History:  Diagnosis Date  . Colitis   . COPD (chronic obstructive pulmonary disease) ([place])   . Coronary atherosclerosis of native coronary artery    a. cath in 2013 showing nonobstructive disease along the LAD and RCA with 99% stenosis of D2 --> too small for intervention, medical therapy recommended b. normal NST in 10/2013 c. 04/2017 cath showing 90% distal-LAD stenosis and occluded D2 with medical management recommended  . Hyperlipidemia   . Hypertension   . Myocardial infarction [facility]) ?2013; ?2015  . PAD (peripheral artery disease) ([place])    a. s/p stent placement to the left SFA in 2015  . Tobacco use     Procedure(s): Left  FEMORAL-POPLITEAL ARTERY Bypass Graft  Discharged Condition: good  HPI: 62 year old male recently underwent angiogram for left lower extremity short distance claudication with leg pain that is likely multifactorial nature.  He has marginal vein for bypass on preoperative mapping.  He is indicated for femoral to above-knee popliteal artery bypass with vein versus graft with Dr. Donzetta Matters.   Hospital Course:  Joshua Hall is a 62 y.o. male is S/P  Procedure(s): Left  FEMORAL-POPLITEAL ARTERY Bypass Graft  ABI Findings: +---------+------------------+-----+---------+--------+ Right  Rt Pressure (mmHg)IndexWaveform Comment  +---------+------------------+-----+---------+--------+ Brachial 120           triphasic     +---------+------------------+-----+---------+--------+ PTA   126        1.05 triphasic      +---------+------------------+-----+---------+--------+ DP    99        0.82 triphasic     +---------+------------------+-----+---------+--------+ Great Toe92        0.77           +---------+------------------+-----+---------+--------+  +---------+------------------+-----+---------+-------+ Left   Lt Pressure (mmHg)IndexWaveform Comment +---------+------------------+-----+---------+-------+ Brachial 109           triphasic     +---------+------------------+-----+---------+-------+ PTA   100        0.83 triphasic     +---------+------------------+-----+---------+-------+ DP    90        0.75 triphasic     +---------+------------------+-----+---------+-------+ Great Toe51        0.43           +---------+------------------+-----+---------+-------+  +-------+-----------+-----------+------------+------------+ ABI/TBIToday's ABIToday's TBIPrevious ABIPrevious TBI +-------+-----------+-----------+------------+------------+ Right 1.05    0.77                 +-------+-----------+-----------+------------+------------+ Left  0.83    0.43                 +-------+-----------+-----------+------------+------------+ Assessment/Planning: POD # 4 left fem-pop above knee  By pass  Improved left LE arterial flow with palpable pedal pulse. No HH recommended, DME rolling walker Disposition stable for discharge. I reassured him that the hematoma does not appear to be expanding and his body will reabsorb it over time. D/C home today will f/u in 2-3 weeks with Dr. Donzetta Matters.   Significant Diagnostic Studies: CBC Lab Results  Component Value Date   WBC 7.7 10/10/2017   HGB 10.1 (L) 10/10/2017   HCT 29.0 (L) 10/10/2017   MCV 88.4 10/10/2017   PLT 204 10/10/2017    BMET    Component Value Date/Time   NA 137  10/09/2017 0336   NA 140 09/03/2017 1111   K 3.8 10/09/2017 0336   CL 104 10/09/2017 0336   CO2 27 10/09/2017 0336   GLUCOSE 122 (H) 10/09/2017 0336   BUN 9 10/09/2017 0336   BUN 8 09/03/2017 1111   CREATININE 0.94 10/09/2017 0336   CALCIUM 8.6 (L) 10/09/2017 0336   GFRNONAA >60 10/09/2017 0336   GFRAA >60 10/09/2017 0336   COAG Lab Results  Component Value Date   INR 0.99 10/03/2017   INR 0.86 04/30/2017   INR 0.8 12/15/2013     Disposition:  Discharge to :Home Discharge Instructions    Call MD for:  redness, tenderness, or signs of infection (pain, swelling, bleeding, redness, odor or green/yellow discharge around incision site)   Complete by:  As directed    Call MD for:  severe or increased pain, loss or decreased feeling  in affected limb(s)   Complete by:  As directed    Call MD for:  temperature >100.5   Complete by:  As directed    Resume previous diet   Complete by:  As directed      Allergies as of 10/12/2017      Reactions   Bee Venom Swelling   SWELLING REACTION UNSPECIFIED       Medication List    STOP taking these medications   cilostazol 100 MG tablet Commonly known as:  PLETAL     TAKE these medications   albuterol 108 (90 Base) MCG/ACT inhaler Commonly known as:  PROVENTIL HFA;VENTOLIN HFA Inhale 2 puffs into the lungs every 6 (six) hours as needed for wheezing or shortness of breath.   amLODipine 10 MG tablet Commonly known as:  NORVASC Take 1 tablet (10 mg total) by mouth daily.   aspirin 81 MG EC tablet Take 1 tablet (81 mg total) by mouth daily.   diphenhydrAMINE 25 mg capsule Commonly known as:  BENADRYL Take 1 capsule (25 mg total) by mouth every 4 (four) hours as needed.   EPIPEN 2-PAK 0.3 mg/0.3 mL Soaj injection Generic drug:  EPINEPHrine Inject 0.3 mg as directed daily as needed. Allergic reaction   HYDROcodone-acetaminophen 5-325 MG tablet Commonly known as:  NORCO/VICODIN Take 1 tablet by mouth every 6 (six) hours as  needed for moderate pain.   losartan 50 MG tablet Commonly known as:  COZAAR Take 1 tablet (50 mg total) by mouth daily. For treatment of high blood pressure and for your heart.   nitroGLYCERIN 0.4 MG SL tablet Commonly known as:  NITROSTAT Place 1 tablet (0.4 mg total) under the tongue every 5 (five) minutes as needed for chest pain (3 doses MAX).   ondansetron 4 MG disintegrating tablet Commonly known as:  ZOFRAN-ODT 4mg  ODT q4 hours prn nausea/vomit What changed:    how much to take  how to take this  additional instructions   rosuvastatin 20 MG tablet Commonly known as:  CRESTOR Take 1 tablet (20 mg total) by mouth daily.      Verbal and written Discharge instructions given to the patient. Wound care per Discharge AVS Follow-up Information    Waynetta Sandy, MD In 3 weeks.   Specialties:  Vascular Surgery, Cardiology Why:  Office will call you to arrange your appt (sent) Contact information: [place] [address] [phone]           Signed: Roxy Horseman 10/14/2017, 8:51 AM - For VQI Registry use --- Instructions: Press F2 to tab through selections.  Delete  question if not applicable.   Post-op:  Wound infection: No  Graft infection: No  Transfusion: No  If yes, 0 units given New Arrhythmia: No Ipsilateral amputation: [x ] no, [ ]  Minor, [ ]  BKA, [ ]  AKA Discharge patency: [x ] Primary, [ ]  Primary assisted, [ ]  Secondary, [ ]  Occluded Patency judged by: [ ]  Dopper only, [ ]  Palpable graft pulse, [x ] Palpable distal pulse, [ ]  ABI inc. > 0.15, [ ]  Duplex Discharge ABI: R 1.05, L 0.83 Discharge TBI: R 0.77, L 0.43 D/C Ambulatory Status: Ambulatory  Complications: MI: [ ]  No, [ ]  Troponin only, [ ]  EKG or Clinical CHF: No Resp failure: [x ] none, [ ]  Pneumonia, [ ]  Ventilator Chg in renal function: [x ] none, [ ]  Inc. Cr > 0.5, [ ]  Temp. Dialysis, [ ]  Permanent dialysis Stroke: [x ] None, [ ]  Minor, [ ]  Major Return  to OR: No  Reason for return to OR: [ ]  Bleeding, [ ]  Infection, [ ]  Thrombosis, [ ]  Revision  Discharge medications: Statin use:  Yes ASA use:  Yes Plavix use:  No  for medical reason   Beta blocker use: No  for medical reason   Coumadin use: No  for medical reason

## 2017-10-15 ENCOUNTER — Ambulatory Visit: Payer: Self-pay | Admitting: Cardiovascular Disease

## 2017-10-15 ENCOUNTER — Ambulatory Visit: Payer: Medicaid Other | Admitting: Cardiovascular Disease

## 2017-10-26 ENCOUNTER — Encounter (HOSPITAL_COMMUNITY): Payer: Self-pay | Admitting: Emergency Medicine

## 2017-10-26 ENCOUNTER — Emergency Department (HOSPITAL_COMMUNITY)
Admission: EM | Admit: 2017-10-26 | Discharge: 2017-10-26 | Disposition: A | Discharge status: Home / Self Care | Payer: Medicaid Other | Attending: Emergency Medicine | Admitting: Emergency Medicine

## 2017-10-26 ENCOUNTER — Emergency Department (HOSPITAL_COMMUNITY): Payer: Medicaid Other

## 2017-10-26 ENCOUNTER — Other Ambulatory Visit: Payer: Self-pay

## 2017-10-26 DIAGNOSIS — J449 Chronic obstructive pulmonary disease, unspecified: Secondary | ICD-10-CM | POA: Diagnosis not present

## 2017-10-26 DIAGNOSIS — Z79899 Other long term (current) drug therapy: Secondary | ICD-10-CM | POA: Diagnosis not present

## 2017-10-26 DIAGNOSIS — I2510 Atherosclerotic heart disease of native coronary artery without angina pectoris: Secondary | ICD-10-CM | POA: Diagnosis not present

## 2017-10-26 DIAGNOSIS — I252 Old myocardial infarction: Secondary | ICD-10-CM | POA: Insufficient documentation

## 2017-10-26 DIAGNOSIS — Z7982 Long term (current) use of aspirin: Secondary | ICD-10-CM | POA: Diagnosis not present

## 2017-10-26 DIAGNOSIS — Y998 Other external cause status: Secondary | ICD-10-CM | POA: Diagnosis not present

## 2017-10-26 DIAGNOSIS — W1831XA Fall on same level due to stepping on an object, initial encounter: Secondary | ICD-10-CM | POA: Insufficient documentation

## 2017-10-26 DIAGNOSIS — Y939 Activity, unspecified: Secondary | ICD-10-CM | POA: Insufficient documentation

## 2017-10-26 DIAGNOSIS — Y929 Unspecified place or not applicable: Secondary | ICD-10-CM | POA: Diagnosis not present

## 2017-10-26 DIAGNOSIS — I10 Essential (primary) hypertension: Secondary | ICD-10-CM | POA: Insufficient documentation

## 2017-10-26 DIAGNOSIS — Z87891 Personal history of nicotine dependence: Secondary | ICD-10-CM | POA: Insufficient documentation

## 2017-10-26 DIAGNOSIS — E785 Hyperlipidemia, unspecified: Secondary | ICD-10-CM | POA: Diagnosis not present

## 2017-10-26 DIAGNOSIS — R52 Pain, unspecified: Secondary | ICD-10-CM

## 2017-10-26 DIAGNOSIS — M79605 Pain in left leg: Secondary | ICD-10-CM | POA: Insufficient documentation

## 2017-10-26 LAB — CBC WITH DIFFERENTIAL/PLATELET
BASOS ABS: 0 10*3/uL (ref 0.0–0.1)
Basophils Relative: 0 %
Eosinophils Absolute: 0.2 10*3/uL (ref 0.0–0.7)
Eosinophils Relative: 2 %
HEMATOCRIT: 33.6 % — AB (ref 39.0–52.0)
Hemoglobin: 12 g/dL — ABNORMAL LOW (ref 13.0–17.0)
LYMPHS ABS: 1.5 10*3/uL (ref 0.7–4.0)
LYMPHS PCT: 16 %
MCH: 31.1 pg (ref 26.0–34.0)
MCHC: 35.7 g/dL (ref 30.0–36.0)
MCV: 87 fL (ref 78.0–100.0)
MONOS PCT: 12 %
Monocytes Absolute: 1.2 10*3/uL — ABNORMAL HIGH (ref 0.1–1.0)
NEUTROS ABS: 6.7 10*3/uL (ref 1.7–7.7)
Neutrophils Relative %: 70 %
Platelets: 485 10*3/uL — ABNORMAL HIGH (ref 150–400)
RBC: 3.86 MIL/uL — ABNORMAL LOW (ref 4.22–5.81)
RDW: 14.1 % (ref 11.5–15.5)
WBC: 9.6 10*3/uL (ref 4.0–10.5)

## 2017-10-26 LAB — BASIC METABOLIC PANEL
ANION GAP: 9 (ref 5–15)
BUN: 14 mg/dL (ref 6–20)
CHLORIDE: 102 mmol/L (ref 98–111)
CO2: 27 mmol/L (ref 22–32)
Calcium: 9.4 mg/dL (ref 8.9–10.3)
Creatinine, Ser: 1 mg/dL (ref 0.61–1.24)
GFR calc Af Amer: >60 mL/min (ref >60)
GLUCOSE: 112 mg/dL — AB (ref 70–99)
POTASSIUM: 3.9 mmol/L (ref 3.5–5.1)
Sodium: 138 mmol/L (ref 135–145)

## 2017-10-26 MED ORDER — HYDROCODONE-ACETAMINOPHEN 5-325 MG PO TABS: 1.0000 | ORAL_TABLET | Freq: Four times a day (QID) | ORAL | 0 refills | Status: AC | PRN

## 2017-10-26 MED ORDER — HYDROCODONE-ACETAMINOPHEN 5-325 MG PO TABS
1.0000 | ORAL_TABLET | Freq: Once | ORAL | Status: AC
  Administered 2017-10-26: 1 via ORAL
  Filled 2017-10-26: qty 1

## 2017-10-26 NOTE — Discharge Instructions (Signed)
IMPORTANT PATIENT INSTRUCTIONS:  Your ED provider has recommended an Outpatient Ultrasound.  Please call [phone] to schedule an appointment.  If your appointment is scheduled for a Saturday, Sunday or holiday, please go to the [facility] Registration Desk at least 15 minutes prior to your appointment time and tell them you are there for an ultrasound.    If your appointment is scheduled for a weekday (Monday-Friday), please go directly to the [facility] at least 15 minutes prior to your appointment time and tell them you are there for an ultrasound.  Please call [phone] with questions. 

## 2017-10-26 NOTE — ED Triage Notes (Signed)
Pt states he had by-pass surgery 10/08/17, yesterday pt lost his balance and fell onto grass, since fall pt has had increased pain and burning to his incisional sight from the surgery, reports no other injury, denies LOC/hitting head

## 2017-10-26 NOTE — ED Notes (Signed)
Pt has ultrasound appointment 10/27/2017 at 9:00 am

## 2017-10-26 NOTE — ED Provider Notes (Signed)
Emergency Department Provider Note   I have reviewed the triage vital signs and the nursing notes.   HISTORY  Chief Complaint Fall   HPI EUGEN JEANSONNE is a 62 y.o. male with PMH of COPD, HLD, HTN, and PAD s/p left fem-pop bypass with Dr. Donzetta Matters on 08/27 resents to the emergency department for evaluation of left leg pain after a fall yesterday.  She states he tripped over his dog and slid to the ground.  He landed partially on the left leg.  Denies head injury or loss of consciousness.  Had a burning pain in the left leg near the incision site of his recent fem-pop bypass since yesterday.  He takes hydrocodone and pain is improved but continued pain wanted to have the leg evaluated.  He denies any fevers or shaking chills.  Denies any pain in the foot or lower leg.  No color changes. Pain in the knee is worse with bearing weight on the leg.   Past Medical History:  Diagnosis Date  . Colitis   . COPD (chronic obstructive pulmonary disease) ([place])   . Coronary atherosclerosis of native coronary artery    a. cath in 2013 showing nonobstructive disease along the LAD and RCA with 99% stenosis of D2 --> too small for intervention, medical therapy recommended b. normal NST in 10/2013 c. 04/2017 cath showing 90% distal-LAD stenosis and occluded D2 with medical management recommended  . Hyperlipidemia   . Hypertension   . Myocardial infarction [facility]) ?2013; ?2015  . PAD (peripheral artery disease) ([place])    a. s/p stent placement to the left SFA in 2015  . Tobacco use     Patient Active Problem List   Diagnosis Date Noted  . Chest pain 08/07/2017  . Hypertension 08/07/2017  . COPD (chronic obstructive pulmonary disease) ([place]) 05/01/2017  . Claudication of both lower extremities ([place]) 05/01/2017  . Closed fracture of shaft of right ulna with routine healing 11/10/16 01/24/2017  . Tremor of right hand 09/06/2016  . Colon polyps 08/04/2015  . Dyspepsia   . Diarrhea 02/17/2015  .  Drug-induced erectile dysfunction 04/25/2014  . PAD (peripheral artery disease) ([place]) 12/15/2013  . Substernal chest pain 11/03/2013  . Unstable angina ([place]) 11/03/2013  . Tobacco use 11/03/2013  . Periumbilical pain 29/51/8841  . Essential hypertension, benign 01/20/2012  . Coronary atherosclerosis of native coronary artery 10/26/2011  . Mixed hyperlipidemia 10/20/2011    Past Surgical History:  Procedure Laterality Date  . ABDOMINAL AORTAGRAM N/A 12/23/2013   Procedure: ABDOMINAL Maxcine Ham;  Surgeon: [name], MD;  Location: [place] CATH LAB;  Service: Cardiovascular;  Laterality: N/A;  . ABDOMINAL AORTOGRAM N/A 09/18/2017   Procedure: ABDOMINAL AORTOGRAM;  Surgeon: [name], MD;  Location: [place] CV LAB;  Service: Cardiovascular;  Laterality: N/A;  . CARDIAC CATHETERIZATION  10/2011   [place]  . COLONOSCOPY N/A 03/16/2015   SLF: 1. No source for abdominal pain identified. 2. six colorectal polyps removed. 3. the colon was redundant 4. mild diverticulosis noted in the sigmoid colon 5. small internal hemorroids.   . ESOPHAGOGASTRODUODENOSCOPY N/A 03/16/2015   SLF: 1. No source for abdominal pain identified. 2. mild non-erosive gastritis and duodentitis.   . FEMORAL-POPLITEAL BYPASS GRAFT Left 10/08/2017   Procedure: Left  FEMORAL-POPLITEAL ARTERY Bypass Graft;  Surgeon: Waynetta Sandy, MD;  Location: [place];  Service: Vascular;  Laterality: Left;  . HERNIA REPAIR    . INSERTION OF MESH N/A 04/13/2015   Procedure:  INSERTION OF MESH;  Surgeon: [name], MD;  Location: [place];  Service: General;  Laterality: N/A;  . LEFT HEART CATH AND CORONARY ANGIOGRAPHY N/A 05/01/2017   Procedure: LEFT HEART CATH AND CORONARY ANGIOGRAPHY;  Surgeon: [name], MD;  Location: [place] CV LAB;  Service: Cardiovascular;  Laterality: N/A;  . LEFT HEART CATHETERIZATION WITH CORONARY ANGIOGRAM N/A 10/19/2011   Procedure: LEFT HEART  CATHETERIZATION WITH CORONARY ANGIOGRAM;  Surgeon: [name], MD;  Location: [facility] CATH LAB;  Service: Cardiovascular;  Laterality: N/A;  . LOWER EXTREMITY ANGIOGRAPHY Left 09/18/2017   Procedure: Lower Extremity Angiography;  Surgeon: [name], MD;  Location: [place] CV LAB;  Service: Cardiovascular;  Laterality: Left;  . MOUTH SURGERY     "all my teeth removed then cosmetic OR for my dentures to fit"  . UMBILICAL HERNIA REPAIR N/A 04/13/2015   Procedure:  UMBILICAL HERNIA REPAIR;  Surgeon: [name], MD;  Location: [place];  Service: General;  Laterality: N/A;   Allergies Bee venom  Family History  Problem Relation Age of Onset  . Diabetes Mother   . Diabetes Father   . Stroke Father   . Colon cancer Maternal Uncle   . Heart disease Brother        Died of MI at 57    Social History Social History   Tobacco Use  . Smoking status: Former Smoker    Packs/day: 1.00    Years: 47.00    Pack years: 47.00    Types: Cigarettes    Last attempt to quit: 09/26/2017    Years since quitting: 0.0  . Smokeless tobacco: Never Used  . Tobacco comment: 10/10/2017 "quit a couple weeks ago; smoked couple cigarettes/day recently; used to smoke 1ppd"  Substance Use Topics  . Alcohol use: Yes    Alcohol/week: 3.0 standard drinks    Types: 2 Cans of beer, 1 Glasses of wine per week  . Drug use: Not Currently    Review of Systems  Constitutional: No fever/chills Eyes: No visual changes. ENT: No sore throat. Cardiovascular: Denies chest pain. Respiratory: Denies shortness of breath. Gastrointestinal: No abdominal pain.  No nausea, no vomiting.  No diarrhea.  No constipation. Genitourinary: Negative for dysuria. Musculoskeletal: Positive left leg pain.  Skin: Negative for rash. Neurological: Negative for headaches, focal weakness or numbness.  10-point ROS otherwise negative.  ____________________________________________   PHYSICAL EXAM:  VITAL  SIGNS: ED Triage Vitals  Enc Vitals Group     BP 10/26/17 2120 126/69     Pulse Rate 10/26/17 2120 78     Resp 10/26/17 2120 20     Temp 10/26/17 2120 99.3 F (37.4 C)     Temp Source 10/26/17 2120 Oral     SpO2 10/26/17 2120 98 %     Weight 10/26/17 2122 180 lb (81.6 kg)     Height 10/26/17 2122 6' (1.829 m)     Pain Score 10/26/17 2120 10   Constitutional: Alert and oriented. Well appearing and in no acute distress. Eyes: Conjunctivae are normal.  Head: Atraumatic. Nose: No congestion/rhinnorhea. Mouth/Throat: Mucous membranes are moist.  Oropharynx non-erythematous. Neck: No stridor.  Cardiovascular: Normal rate, regular rhythm. Good peripheral circulation. Grossly normal heart sounds. Strong DP and PT pulse in the left foot.  Respiratory: Normal respiratory effort.  No retractions. Lungs CTAB. Gastrointestinal: Soft and nontender. No distention.  Musculoskeletal: Mild tenderness over the left medial thigh with faint erythema. No purulent drainage. Incisions  are clean and dry. No dehiscence. No evidence of septic arthritis in the left knee.  Neurologic:  Normal speech and language. No gross focal neurologic deficits are appreciated.  Skin:  Skin is warm, dry and intact. No rash noted.   ____________________________________________   LABS (all labs ordered are listed, but only abnormal results are displayed)  Labs Reviewed  BASIC METABOLIC PANEL - Abnormal; Notable for the following components:      Result Value   Glucose, Bld 112 (*)    All other components within normal limits  CBC WITH DIFFERENTIAL/PLATELET - Abnormal; Notable for the following components:   RBC 3.86 (*)    Hemoglobin 12.0 (*)    HCT 33.6 (*)    Platelets 485 (*)    Monocytes Absolute 1.2 (*)    All other components within normal limits   ____________________________________________  RADIOLOGY  Dg Knee Complete 4 Views Left  Result Date: 10/26/2017 CLINICAL DATA:  Left knee pain after fall  yesterday. EXAM: LEFT KNEE - COMPLETE 4+ VIEW COMPARISON:  None. FINDINGS: No evidence of fracture, dislocation, or joint effusion. Osteoarthritis with tricompartmental peripheral spurring and mild diffuse joint space narrowing. Surgical clips in the medial soft tissues of the distal thigh. Soft tissue edema most prominent medially. IMPRESSION: 1. 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 2. Soft tissue edema. Electronically Signed   By: Keith Rake [name]   On: 10/26/2017 22:40    ____________________________________________   PROCEDURES  Procedure(s) performed:   Procedures  None ____________________________________________   INITIAL IMPRESSION / ASSESSMENT AND PLAN / ED COURSE  Pertinent labs & imaging results that were available during my care of the patient were reviewed by me and considered in my medical decision making (see chart for details).  Patient presents to the emergency department with left leg pain after fall yesterday.  There is no evidence of infection in the leg.  He is having some burning pain near the incision.  No dehiscence.  Plain film of the left knee is normal.  There is no swelling in the left lower extremity.  There is some swelling around the left thigh.  Patient has brisk pulses distally in the leg. No concern for graft compromise. Plan to have the patient return tomorrow when [place] is available to r/o DVT but low clinical suspicion for this. Labs reviewed with no leukocytosis. No concern for cellulitis.   At this time, I do not feel there is any life-threatening condition present. I have reviewed and discussed all results (EKG, imaging, lab, urine as appropriate), exam findings with patient. I have reviewed nursing notes and appropriate previous records.  I feel the patient is safe to be discharged home without further emergent workup. Discussed usual and customary return precautions. Patient and family (if present) verbalize  understanding and are comfortable with this plan.  Patient will follow-up with their primary care provider. If they do not have a primary care provider, information for follow-up has been provided to them. All questions have been answered.  ____________________________________________  FINAL CLINICAL IMPRESSION(S) / ED DIAGNOSES  Final diagnoses:  Left leg pain    MEDICATIONS GIVEN DURING THIS VISIT:  Medications  HYDROcodone-acetaminophen (NORCO/VICODIN) 5-325 MG per tablet 1 tablet (1 tablet Oral Given 10/26/17 2239)     NEW OUTPATIENT MEDICATIONS STARTED DURING THIS VISIT:  New Prescriptions   HYDROCODONE-ACETAMINOPHEN (NORCO/VICODIN) 5-325 MG TABLET    Take 1 tablet by mouth every 6 (six) hours as needed for up to 5 days for severe pain.  Note:  This document was prepared using Dragon voice recognition software and may include unintentional dictation errors.  Nanda Quinton, MD Emergency Medicine    Long, [facility], MD 10/26/17 [phone]

## 2017-10-27 ENCOUNTER — Ambulatory Visit (HOSPITAL_COMMUNITY)
Admission: RE | Admit: 2017-10-27 | Discharge: 2017-10-27 | Disposition: A | Discharge status: Home / Self Care | Payer: Medicaid Other | Source: Ambulatory Visit | Attending: Emergency Medicine | Admitting: Emergency Medicine

## 2017-10-27 DIAGNOSIS — M79605 Pain in left leg: Secondary | ICD-10-CM | POA: Diagnosis present

## 2017-10-27 DIAGNOSIS — M7989 Other specified soft tissue disorders: Secondary | ICD-10-CM | POA: Insufficient documentation

## 2017-11-01 ENCOUNTER — Other Ambulatory Visit: Payer: Self-pay

## 2017-11-01 ENCOUNTER — Ambulatory Visit (INDEPENDENT_AMBULATORY_CARE_PROVIDER_SITE_OTHER): Payer: Self-pay | Admitting: Vascular Surgery

## 2017-11-01 ENCOUNTER — Encounter: Payer: Self-pay | Admitting: Vascular Surgery

## 2017-11-01 VITALS — BP 126/76 | HR 66 | Temp 98.0°F | Resp 16 | Ht 72.0 in | Wt 180.0 lb

## 2017-11-01 DIAGNOSIS — I739 Peripheral vascular disease, unspecified: Secondary | ICD-10-CM

## 2017-11-01 MED ORDER — OXYCODONE-ACETAMINOPHEN 5-325 MG PO TABS: 1.0000 | ORAL_TABLET | ORAL | 0 refills | Status: DC | PRN

## 2017-11-01 NOTE — Progress Notes (Signed)
    Subjective:     Patient ID: Joshua Hall, male   DOB: 1955-02-25, 62 y.o.   MRN: [id number]  HPI 62 year old male recently underwent left femoral to above-knee popliteal bypass with vein for extensive claudication.  He is doing well.  He does have some residual swelling in his groin as well as burning above the knee.  He is out of pain medication is requesting a few more to get him through the immediate postoperative period.   Review of Systems Left leg swelling, burning pain near above-knee incision    Objective:   Physical Exam Awake alert oriented Incisions are all clean dry intact left lower extremity Strong signal left posterior tibial artery and left foot is very warm however I cannot palpate the posterior tibial pulse    Assessment:     62 year old male status post left femoropopliteal bypass with vein for severe claudication    Plan:     Follow-up in 3 months with left lower extremity duplex and ABI Have given him 30 additional Percocet today and this will be the last time he gets pain prescription from his most recent surgery.  Brandon C. Donzetta Matters, MD [facility] Office: [phone] Pager: [phone]

## 2017-11-05 ENCOUNTER — Ambulatory Visit: Payer: Self-pay | Admitting: Cardiovascular Disease

## 2017-11-12 ENCOUNTER — Ambulatory Visit (INDEPENDENT_AMBULATORY_CARE_PROVIDER_SITE_OTHER): Payer: Self-pay | Admitting: Cardiovascular Disease

## 2017-11-12 ENCOUNTER — Encounter: Payer: Self-pay | Admitting: Cardiovascular Disease

## 2017-11-12 VITALS — BP 150/82 | HR 65 | Ht 72.0 in | Wt 183.4 lb

## 2017-11-12 DIAGNOSIS — I739 Peripheral vascular disease, unspecified: Secondary | ICD-10-CM

## 2017-11-12 DIAGNOSIS — E785 Hyperlipidemia, unspecified: Secondary | ICD-10-CM

## 2017-11-12 DIAGNOSIS — I25118 Atherosclerotic heart disease of native coronary artery with other forms of angina pectoris: Secondary | ICD-10-CM

## 2017-11-12 DIAGNOSIS — I10 Essential (primary) hypertension: Secondary | ICD-10-CM

## 2017-11-12 NOTE — Progress Notes (Signed)
Cardiology Office Note   Date:  11/12/2017   ID:  Rosie, Golson [birth year], MRN [id number]  PCP:  Celene Squibb, MD  Cardiologist:   Kathlyn Sacramento, MD   No chief complaint on file.     History of Present Illness: Joshua Hall is a 62 y.o. male who presents for a follow-up visit regarding coronary artery disease and peripheral arterial disease. He has chronic medical conditions that include tobacco use, hypertension and hyperlipidemia.  He is known to have peripheral arterial disease with claudication.  He had angiography in 2015 which showed severe mid to distal left SFA stenosis with evidence of plaque rupture.  I performed successful self-expanding stent placement at that time.   He had unstable angina in March of this year. Cardiac catheterization showed 90% distal LAD stenosis close to the apex, occluded second diagonal and no other obstructive disease.  LVEDP was normal and ejection fraction was 55 to 60%.  The distal LAD was too small to stent and I have recommended medical therapy. He had worsening left calf claudication with evidence of SFA occlusion.  He underwent femoral to above-the-knee popliteal artery bypass in August.  He is still recovering from this with some swelling and scarring.  He seems to be gradually improving.  Claudication resolved. No chest pain or shortness of breath.  Past Medical History:  Diagnosis Date  . Colitis   . COPD (chronic obstructive pulmonary disease) ([place])   . Coronary atherosclerosis of native coronary artery    a. cath in 2013 showing nonobstructive disease along the LAD and RCA with 99% stenosis of D2 --> too small for intervention, medical therapy recommended b. normal NST in 10/2013 c. 04/2017 cath showing 90% distal-LAD stenosis and occluded D2 with medical management recommended  . Hyperlipidemia   . Hypertension   . Myocardial infarction [facility]) ?2013; ?2015  . PAD (peripheral artery disease) ([place])    a. s/p stent  placement to the left SFA in 2015  . Tobacco use     Past Surgical History:  Procedure Laterality Date  . ABDOMINAL AORTAGRAM N/A 12/23/2013   Procedure: ABDOMINAL Maxcine Ham;  Surgeon: [name], MD;  Location: [facility] CATH LAB;  Service: Cardiovascular;  Laterality: N/A;  . ABDOMINAL AORTOGRAM N/A 09/18/2017   Procedure: ABDOMINAL AORTOGRAM;  Surgeon: [name], MD;  Location: [place] CV LAB;  Service: Cardiovascular;  Laterality: N/A;  . CARDIAC CATHETERIZATION  10/2011   [place]  . COLONOSCOPY N/A 03/16/2015   SLF: 1. No source for abdominal pain identified. 2. six colorectal polyps removed. 3. the colon was redundant 4. mild diverticulosis noted in the sigmoid colon 5. small internal hemorroids.   . ESOPHAGOGASTRODUODENOSCOPY N/A 03/16/2015   SLF: 1. No source for abdominal pain identified. 2. mild non-erosive gastritis and duodentitis.   . FEMORAL-POPLITEAL BYPASS GRAFT Left 10/08/2017   Procedure: Left  FEMORAL-POPLITEAL ARTERY Bypass Graft;  Surgeon: Waynetta Sandy, MD;  Location: [place];  Service: Vascular;  Laterality: Left;  . HERNIA REPAIR    . INSERTION OF MESH N/A 04/13/2015   Procedure: INSERTION OF MESH;  Surgeon: [name], MD;  Location: [place];  Service: General;  Laterality: N/A;  . LEFT HEART CATH AND CORONARY ANGIOGRAPHY N/A 05/01/2017   Procedure: LEFT HEART CATH AND CORONARY ANGIOGRAPHY;  Surgeon: [name], MD;  Location: [place] CV LAB;  Service: Cardiovascular;  Laterality: N/A;  . LEFT HEART CATHETERIZATION WITH CORONARY ANGIOGRAM N/A  10/19/2011   Procedure: LEFT HEART CATHETERIZATION WITH CORONARY ANGIOGRAM;  Surgeon: [name], MD;  Location: [facility] CATH LAB;  Service: Cardiovascular;  Laterality: N/A;  . LOWER EXTREMITY ANGIOGRAPHY Left 09/18/2017   Procedure: Lower Extremity Angiography;  Surgeon: [name], MD;  Location: [place] CV LAB;  Service: Cardiovascular;  Laterality: Left;  .  MOUTH SURGERY     "all my teeth removed then cosmetic OR for my dentures to fit"  . UMBILICAL HERNIA REPAIR N/A 04/13/2015   Procedure:  UMBILICAL HERNIA REPAIR;  Surgeon: [name], MD;  Location: [place];  Service: General;  Laterality: N/A;     Current Outpatient Medications  Medication Sig Dispense Refill  . albuterol (PROVENTIL HFA;VENTOLIN HFA) 108 (90 BASE) MCG/ACT inhaler Inhale 2 puffs into the lungs every 6 (six) hours as needed for wheezing or shortness of breath. 1 Inhaler 0  . aspirin EC 81 MG EC tablet Take 1 tablet (81 mg total) by mouth daily.    [name] EPIPEN 2-PAK 0.3 MG/0.3ML SOAJ injection Inject 0.3 mg as directed daily as needed. Allergic reaction  1  . losartan (COZAAR) 50 MG tablet Take 1 tablet (50 mg total) by mouth daily. For treatment of high blood pressure and for your heart. 90 tablet 3  . nitroGLYCERIN (NITROSTAT) 0.4 MG SL tablet Place 1 tablet (0.4 mg total) under the tongue every 5 (five) minutes as needed for chest pain (3 doses MAX). 25 tablet 1  . oxyCODONE-acetaminophen (PERCOCET/ROXICET) 5-325 MG tablet Take 1 tablet by mouth every 4 (four) hours as needed for severe pain. 30 tablet 0  . rosuvastatin (CRESTOR) 20 MG tablet Take 1 tablet (20 mg total) by mouth daily. 90 tablet 3  . amLODipine (NORVASC) 10 MG tablet Take 1 tablet (10 mg total) by mouth daily. 90 tablet 3   No current facility-administered medications for this visit.     Allergies:   Bee venom    Social History:  The patient  reports that he quit smoking about 6 weeks ago. His smoking use included cigarettes. He has a 47.00 pack-year smoking history. He has never used smokeless tobacco. He reports that he drinks about 3.0 standard drinks of alcohol per week. He reports that he has current or past drug history.   Family History:  The patient's family history includes Colon cancer in his maternal uncle; Diabetes in his father and mother; Heart disease in his brother; Stroke  in his father.    ROS:  Please see the history of present illness.   Otherwise, review of systems are positive for none.   All other systems are reviewed and negative.    PHYSICAL EXAM: VS:  BP (!) 150/82 (BP Location: Left Arm, Patient Position: Sitting, Cuff Size: Normal)   Pulse 65   Ht 6' (1.829 m)   Wt 183 lb 6.4 oz (83.2 kg)   BMI 24.87 kg/m  , BMI Body mass index is 24.87 kg/m. GEN: Well nourished, well developed, in no acute distress  HEENT: normal  Neck: no JVD, carotid bruits, or masses Cardiac: RRR; no murmurs, rubs, or gallops,no edema  Respiratory:  clear to auscultation bilaterally, normal work of breathing GI: soft, nontender, nondistended, + BS MS: no deformity or atrophy  Skin: warm and dry, no rash Neuro:  Strength and sensation are intact Psych: euthymic mood, full affect   EKG:  EKG is not ordered today.   Recent Labs: 08/08/2017: Magnesium 1.8 10/03/2017: ALT 37 10/26/2017: BUN 14;  Creatinine, Ser 1.00; Hemoglobin 12.0; Platelets 485; Potassium 3.9; Sodium 138    Lipid Panel    Component Value Date/Time   CHOL 170 08/07/2017 0836   TRIG 94 08/07/2017 0836   HDL 54 08/07/2017 0836   CHOLHDL 3.1 08/07/2017 0836   VLDL 19 08/07/2017 0836   LDLCALC 97 08/07/2017 0836      Wt Readings from Last 3 Encounters:  11/12/17 183 lb 6.4 oz (83.2 kg)  11/01/17 180 lb (81.6 kg)  10/26/17 180 lb (81.6 kg)      No flowsheet data found.    ASSESSMENT AND PLAN:  1.  Coronary artery disease involving native coronary arteries with other forms of angina: Distal LAD stenosis being treated medically.  2.  Peripheral arterial disease: Status post left femoral-popliteal bypass in August by Dr. Donzetta Matters.  He continues to have significant postoperative pain but this has been gradually improving.  I advised him not to get any more refills of Percocet after he is finished with current supply.  Instead, I encouraged him to use extra strength Tylenol and short-term  NSAIDs.  3.  Previous tobacco use: The patient has not smoked in the last few months.  4.  Essential hypertension: Blood pressure is elevated today but he has not taken his antihypertensive medications yet.  5.   Hyperlipidemia: Continue treatment with rosuvastatin.  The patient will need a follow-up lipid profile in the next 6 months.  Most recent LDL was 97.  We have to achieve a target LDL below 70 by either increasing the dose of rosuvastatin and/or adding Zetia.   Disposition:   FU with with me In 6 months  Signed,  Kathlyn Sacramento, MD  11/12/2017 8:54 AM    [facility]

## 2017-11-12 NOTE — Patient Instructions (Signed)
Medication Instructions:  Your physician recommends that you continue on your current medications as directed. Please refer to the Current Medication list given to you today.   Labwork: None ordered  Testing/Procedures: None ordered  Follow-Up: Your physician wants you to follow-up in: 6 months with Dr.Arida You will receive a reminder letter in the mail two months in advance. If you don't receive a letter, please call our office to schedule the follow-up appointment.   Any Other Special Instructions Will Be Listed Below (If Applicable).     If you need a refill on your cardiac medications before your next appointment, please call your pharmacy.   

## 2017-12-06 ENCOUNTER — Other Ambulatory Visit: Payer: Self-pay | Admitting: *Deleted

## 2017-12-06 DIAGNOSIS — I739 Peripheral vascular disease, unspecified: Secondary | ICD-10-CM

## 2017-12-11 ENCOUNTER — Other Ambulatory Visit: Payer: Self-pay

## 2017-12-11 ENCOUNTER — Emergency Department (HOSPITAL_COMMUNITY): Payer: Medicaid Other

## 2017-12-11 ENCOUNTER — Encounter (HOSPITAL_COMMUNITY): Payer: Self-pay

## 2017-12-11 ENCOUNTER — Observation Stay (HOSPITAL_COMMUNITY)
Admission: EM | Admit: 2017-12-11 | Discharge: 2017-12-12 | Disposition: A | Discharge status: Home / Self Care | Payer: Medicaid Other | Attending: Family Medicine | Admitting: Family Medicine

## 2017-12-11 DIAGNOSIS — Z79899 Other long term (current) drug therapy: Secondary | ICD-10-CM | POA: Insufficient documentation

## 2017-12-11 DIAGNOSIS — Z7982 Long term (current) use of aspirin: Secondary | ICD-10-CM | POA: Insufficient documentation

## 2017-12-11 DIAGNOSIS — J449 Chronic obstructive pulmonary disease, unspecified: Secondary | ICD-10-CM | POA: Diagnosis not present

## 2017-12-11 DIAGNOSIS — I10 Essential (primary) hypertension: Secondary | ICD-10-CM | POA: Diagnosis not present

## 2017-12-11 DIAGNOSIS — R079 Chest pain, unspecified: Secondary | ICD-10-CM

## 2017-12-11 DIAGNOSIS — Z87891 Personal history of nicotine dependence: Secondary | ICD-10-CM | POA: Diagnosis not present

## 2017-12-11 DIAGNOSIS — R0789 Other chest pain: Secondary | ICD-10-CM | POA: Diagnosis not present

## 2017-12-11 LAB — COMPREHENSIVE METABOLIC PANEL
ALT: 19 U/L (ref 0–44)
ANION GAP: 8 (ref 5–15)
AST: 20 U/L (ref 15–41)
Albumin: 4 g/dL (ref 3.5–5.0)
Alkaline Phosphatase: 73 U/L (ref 38–126)
BUN: 10 mg/dL (ref 6–20)
CHLORIDE: 105 mmol/L (ref 98–111)
CO2: 26 mmol/L (ref 22–32)
Calcium: 9.6 mg/dL (ref 8.9–10.3)
Creatinine, Ser: 1.05 mg/dL (ref 0.61–1.24)
GFR calc Af Amer: >60 mL/min (ref >60)
GFR calc non Af Amer: >60 mL/min (ref >60)
GLUCOSE: 107 mg/dL — AB (ref 70–99)
POTASSIUM: 3.9 mmol/L (ref 3.5–5.1)
Sodium: 139 mmol/L (ref 135–145)
Total Bilirubin: 0.5 mg/dL (ref 0.3–1.2)
Total Protein: 7.2 g/dL (ref 6.5–8.1)

## 2017-12-11 LAB — CBC WITH DIFFERENTIAL/PLATELET
Abs Immature Granulocytes: 0.01 10*3/uL (ref 0.00–0.07)
Basophils Absolute: 0 10*3/uL (ref 0.0–0.1)
Basophils Relative: 1 %
EOS ABS: 0.2 10*3/uL (ref 0.0–0.5)
Eosinophils Relative: 4 %
HCT: 37.7 % — ABNORMAL LOW (ref 39.0–52.0)
Hemoglobin: 12.9 g/dL — ABNORMAL LOW (ref 13.0–17.0)
IMMATURE GRANULOCYTES: 0 %
LYMPHS ABS: 2 10*3/uL (ref 0.7–4.0)
Lymphocytes Relative: 39 %
MCH: 29.5 pg (ref 26.0–34.0)
MCHC: 34.2 g/dL (ref 30.0–36.0)
MCV: 86.3 fL (ref 80.0–100.0)
Monocytes Absolute: 0.7 10*3/uL (ref 0.1–1.0)
Monocytes Relative: 13 %
NEUTROS PCT: 43 %
NRBC: 0 % (ref 0.0–0.2)
Neutro Abs: 2.2 10*3/uL (ref 1.7–7.7)
PLATELETS: 292 10*3/uL (ref 150–400)
RBC: 4.37 MIL/uL (ref 4.22–5.81)
RDW: 15.3 % (ref 11.5–15.5)
WBC: 5 10*3/uL (ref 4.0–10.5)

## 2017-12-11 LAB — I-STAT TROPONIN, ED: TROPONIN I, POC: 0.02 ng/mL (ref 0.00–0.08)

## 2017-12-11 LAB — TROPONIN I
Troponin I: <0.03 ng/mL (ref <0.03)
Troponin I: <0.03 ng/mL (ref <0.03)
Troponin I: <0.03 ng/mL (ref <0.03)

## 2017-12-11 MED ORDER — METOPROLOL TARTRATE 25 MG PO TABS
25.0000 mg | ORAL_TABLET | Freq: Two times a day (BID) | ORAL | Status: DC
  Administered 2017-12-11 – 2017-12-12 (×3): 25 mg via ORAL
  Filled 2017-12-11 (×2): qty 1

## 2017-12-11 MED ORDER — KETOROLAC TROMETHAMINE 15 MG/ML IJ SOLN
15.0000 mg | Freq: Three times a day (TID) | INTRAMUSCULAR | Status: DC
  Administered 2017-12-11 – 2017-12-12 (×2): 15 mg via INTRAVENOUS
  Filled 2017-12-11 (×2): qty 1

## 2017-12-11 MED ORDER — ACETAMINOPHEN 325 MG PO TABS: 650.0000 mg | ORAL_TABLET | Freq: Four times a day (QID) | ORAL | Status: DC | PRN

## 2017-12-11 MED ORDER — NITROGLYCERIN 2 % TD OINT
1.0000 [in_us] | TOPICAL_OINTMENT | Freq: Three times a day (TID) | TRANSDERMAL | Status: AC
  Administered 2017-12-11 – 2017-12-12 (×3): 1 [in_us] via TOPICAL
  Filled 2017-12-11 (×3): qty 1

## 2017-12-11 MED ORDER — ONDANSETRON HCL 4 MG PO TABS: 4.0000 mg | ORAL_TABLET | Freq: Four times a day (QID) | ORAL | Status: DC | PRN

## 2017-12-11 MED ORDER — ASPIRIN EC 81 MG PO TBEC
81.0000 mg | DELAYED_RELEASE_TABLET | Freq: Every day | ORAL | Status: DC
  Administered 2017-12-11 – 2017-12-12 (×2): 81 mg via ORAL
  Filled 2017-12-11 (×2): qty 1

## 2017-12-11 MED ORDER — POLYETHYLENE GLYCOL 3350 17 G PO PACK: 17.0000 g | PACK | Freq: Every day | ORAL | Status: DC | PRN

## 2017-12-11 MED ORDER — HEPARIN SODIUM (PORCINE) 5000 UNIT/ML IJ SOLN
5000.0000 [IU] | Freq: Three times a day (TID) | INTRAMUSCULAR | Status: DC
  Administered 2017-12-11: 5000 [IU] via SUBCUTANEOUS
  Filled 2017-12-11 (×2): qty 1

## 2017-12-11 MED ORDER — SODIUM CHLORIDE 0.9% FLUSH
3.0000 mL | Freq: Two times a day (BID) | INTRAVENOUS | Status: DC
  Administered 2017-12-11 – 2017-12-12 (×2): 3 mL via INTRAVENOUS

## 2017-12-11 MED ORDER — ALBUTEROL SULFATE HFA 108 (90 BASE) MCG/ACT IN AERS: 2.0000 | INHALATION_SPRAY | Freq: Four times a day (QID) | RESPIRATORY_TRACT | Status: DC | PRN

## 2017-12-11 MED ORDER — SODIUM CHLORIDE 0.9% FLUSH: 3.0000 mL | INTRAVENOUS | Status: DC | PRN

## 2017-12-11 MED ORDER — ALBUTEROL SULFATE (2.5 MG/3ML) 0.083% IN NEBU: 2.5000 mg | INHALATION_SOLUTION | RESPIRATORY_TRACT | Status: DC | PRN

## 2017-12-11 MED ORDER — ONDANSETRON HCL 4 MG/2ML IJ SOLN: 4.0000 mg | Freq: Four times a day (QID) | INTRAMUSCULAR | Status: DC | PRN

## 2017-12-11 MED ORDER — ROSUVASTATIN CALCIUM 20 MG PO TABS
20.0000 mg | ORAL_TABLET | Freq: Every day | ORAL | Status: DC
  Administered 2017-12-11 – 2017-12-12 (×2): 20 mg via ORAL
  Filled 2017-12-11 (×2): qty 1

## 2017-12-11 MED ORDER — ACETAMINOPHEN 650 MG RE SUPP: 650.0000 mg | Freq: Four times a day (QID) | RECTAL | Status: DC | PRN

## 2017-12-11 MED ORDER — METHOCARBAMOL 500 MG PO TABS
500.0000 mg | ORAL_TABLET | Freq: Four times a day (QID) | ORAL | Status: DC
  Administered 2017-12-11 – 2017-12-12 (×4): 500 mg via ORAL
  Filled 2017-12-11 (×4): qty 1

## 2017-12-11 MED ORDER — SODIUM CHLORIDE 0.9 % IV SOLN: 250.0000 mL | INTRAVENOUS | Status: DC | PRN

## 2017-12-11 MED ORDER — TRAZODONE HCL 50 MG PO TABS: 50.0000 mg | ORAL_TABLET | Freq: Every evening | ORAL | Status: DC | PRN

## 2017-12-11 MED ORDER — NITROGLYCERIN 0.4 MG SL SUBL: 0.4000 mg | SUBLINGUAL_TABLET | SUBLINGUAL | Status: DC | PRN

## 2017-12-11 MED ORDER — AMLODIPINE BESYLATE 5 MG PO TABS
10.0000 mg | ORAL_TABLET | Freq: Every day | ORAL | Status: DC
  Administered 2017-12-11 – 2017-12-12 (×2): 10 mg via ORAL
  Filled 2017-12-11 (×2): qty 2

## 2017-12-11 MED ORDER — LOSARTAN POTASSIUM 50 MG PO TABS
50.0000 mg | ORAL_TABLET | Freq: Every day | ORAL | Status: DC
  Administered 2017-12-11 – 2017-12-12 (×2): 50 mg via ORAL
  Filled 2017-12-11 (×2): qty 1

## 2017-12-11 NOTE — ED Triage Notes (Signed)
Pt c/o chest pain off and on for the past few days.  Reports pain worse today.  Pt took 2 81mg  asa this morning.  Denies sob or n/v.

## 2017-12-11 NOTE — H&P (Addendum)
Patient Demographics:    Joshua Hall, is a 62 y.o. male  MRN: [id number]   DOB - [birth year]  Admit Date - 12/11/2017  Outpatient Primary MD for the patient is Celene Squibb, MD   Assessment & Plan:    Active Problems:   Chest pain    1)Atypical Chest Pain-  H/o CAD and PAD, consistently reproducible left-sided chest wall tenderness, reproducible with palpation, reproducible with positional change and range of motion.  Place in Observation status on  telemetry monitored unit, check serial troponins and EKG to rule out acute coronary syndrome .  If patient rules out for ACS may be able to go home and follow-up with cardiology as outpatient,   give aspirin, Crestor, nitroglycerin, and metoprolol... Give Robaxin and Toradol for presumed musculoskeletal chest wall pain  2)H/o CAD- cardiac catheterization in 04/2017 showing 90% distal LAD stenosis close to the apex, occluded second diagonal and no other obstructive disease.  LVEDP was normal and ejection fraction was 55 to 60%.  The distal LAD was too small to stent etiology recommended medical therapy, continue aspirin, Crestor, add metoprolol and nitroglycerin  3)H/o PAD--  with prior SFA stenting for stenosis , and subsequent above-the-knee femoropopliteal bypass in 09/2017 , continue aspirin and Crestor  4)HLD--LDL goal is less than 70 given PAD and CAD, continue Crestor last LDL was 97  5) tobacco abuse--- patient states that he has not smoked since September 2019, congratulated on smoking cessation ,, encouraged to stay abstinent from tobacco  6)HTN--- BP is not at goal, restart losartan and amlodipine, metoprolol added, nitroglycerin added  With History of - Reviewed by me  Past Medical History:  Diagnosis Date  . Colitis   . COPD (chronic obstructive  pulmonary disease) ([place])   . Coronary atherosclerosis of native coronary artery    a. cath in 2013 showing nonobstructive disease along the LAD and RCA with 99% stenosis of D2 --> too small for intervention, medical therapy recommended b. normal NST in 10/2013 c. 04/2017 cath showing 90% distal-LAD stenosis and occluded D2 with medical management recommended  . Hyperlipidemia   . Hypertension   . Myocardial infarction [facility]) ?2013; ?2015  . PAD (peripheral artery disease) ([place])    a. s/p stent placement to the left SFA in 2015  . Tobacco use       Past Surgical History:  Procedure Laterality Date  . ABDOMINAL AORTAGRAM N/A 12/23/2013   Procedure: ABDOMINAL Maxcine Ham;  Surgeon: [name], MD;  Location: [place] CATH LAB;  Service: Cardiovascular;  Laterality: N/A;  . ABDOMINAL AORTOGRAM N/A 09/18/2017   Procedure: ABDOMINAL AORTOGRAM;  Surgeon: [name], MD;  Location: [place] CV LAB;  Service: Cardiovascular;  Laterality: N/A;  . CARDIAC CATHETERIZATION  10/2011   [place]  . COLONOSCOPY N/A 03/16/2015   SLF: 1. No source for abdominal pain identified. 2. six colorectal polyps removed. 3. the colon was redundant 4. mild diverticulosis  noted in the sigmoid colon 5. small internal hemorroids.   . ESOPHAGOGASTRODUODENOSCOPY N/A 03/16/2015   SLF: 1. No source for abdominal pain identified. 2. mild non-erosive gastritis and duodentitis.   . FEMORAL-POPLITEAL BYPASS GRAFT Left 10/08/2017   Procedure: Left  FEMORAL-POPLITEAL ARTERY Bypass Graft;  Surgeon: Waynetta Sandy, MD;  Location: [place];  Service: Vascular;  Laterality: Left;  . HERNIA REPAIR    . INSERTION OF MESH N/A 04/13/2015   Procedure: INSERTION OF MESH;  Surgeon: [name], MD;  Location: [place];  Service: General;  Laterality: N/A;  . LEFT HEART CATH AND CORONARY ANGIOGRAPHY N/A 05/01/2017   Procedure: LEFT HEART CATH AND CORONARY ANGIOGRAPHY;  Surgeon: [name], MD;   Location: [place] CV LAB;  Service: Cardiovascular;  Laterality: N/A;  . LEFT HEART CATHETERIZATION WITH CORONARY ANGIOGRAM N/A 10/19/2011   Procedure: LEFT HEART CATHETERIZATION WITH CORONARY ANGIOGRAM;  Surgeon: [name], MD;  Location: [facility] CATH LAB;  Service: Cardiovascular;  Laterality: N/A;  . LOWER EXTREMITY ANGIOGRAPHY Left 09/18/2017   Procedure: Lower Extremity Angiography;  Surgeon: [name], MD;  Location: [place] CV LAB;  Service: Cardiovascular;  Laterality: Left;  . MOUTH SURGERY     "all my teeth removed then cosmetic OR for my dentures to fit"  . UMBILICAL HERNIA REPAIR N/A 04/13/2015   Procedure:  UMBILICAL HERNIA REPAIR;  Surgeon: [name], MD;  Location: [place];  Service: General;  Laterality: N/A;      Chief Complaint  Patient presents with  . Chest Pain      HPI:    Joshua Hall  is a 62 y.o. male with history of HTN,  tobacco abuse (last smoked 10/2017), PAD with prior SFA stenting for stenosis , and subsequent above-the-knee femoropopliteal bypass in 09/2017 , history of CAD with cardiac catheterization in 04/2017 showing 90% distal LAD stenosis close to the apex, occluded second diagonal and no other obstructive disease.  LVEDP was normal and ejection fraction was 55 to 60%.  The distal LAD was too small to stent etiology recommended medical therapy, as well as history of dyslipidemia with LDL of 97 who presents to the ED on 12/11/2017 with intermittent left-sided chest discomfort.  On further questioning patient states that he has had intermittent chest discomfort over the last week however yesterday he was using the tractor to mow and cut grass/[place].... Later in the evening of 12/10/2017 after he got out of the tractor he had left-sided chest discomfort that was sharp from time to time intermittent usually with positional change.  No pleuritic symptoms no leg swelling, no productive cough, no fever no chills, chest discomfort  does not radiate anywhere, no nausea no vomiting no dizziness no palpitations no shortness of breath no dyspnea on exertion.  No headaches no visual disturbance no TIA or strokelike symptoms  In the ED EKG and troponin negative, exam revealed consistent reproducible chest wall tenderness in the same area, also  chest wall pain and tenderness is consistently reproducible with positional change/consistently reproducible left-sided chest wall tenderness, reproducible with palpation, reproducible with positional change and range of motion.   EDP discussed case with on-call cardiologist, despite atypical nature of chest pain given the patient's cardiac history we will keep him overnight for ACS rule out    Review of systems:    In addition to the HPI above,   A full Review of  Systems was done, all other systems reviewed are negative except  as noted above in HPI , .    Social History:  Reviewed by me    Social History   Tobacco Use  . Smoking status: Former Smoker    Packs/day: 1.00    Years: 47.00    Pack years: 47.00    Types: Cigarettes    Last attempt to quit: 09/26/2017    Years since quitting: 0.2  . Smokeless tobacco: Never Used  . Tobacco comment: 10/10/2017 "quit a couple weeks ago; smoked couple cigarettes/day recently; used to smoke 1ppd"  Substance Use Topics  . Alcohol use: Yes    Alcohol/week: 3.0 standard drinks    Types: 1 Glasses of wine, 2 Cans of beer per week    Comment: occ     Family History :  Reviewed by me    Family History  Problem Relation Age of Onset  . Diabetes Mother   . Diabetes Father   . Stroke Father   . Colon cancer Maternal Uncle   . Heart disease Brother        Died of MI at 81     Home Medications:   Prior to Admission medications   Medication Sig Start Date End Date Taking? Authorizing Provider  amLODipine (NORVASC) 10 MG tablet Take 1 tablet (10 mg total) by mouth daily. 09/03/17 12/11/17 Yes [name], MD  aspirin EC  81 MG EC tablet Take 1 tablet (81 mg total) by mouth daily. 09/07/16  Yes Rexene Alberts, MD  losartan (COZAAR) 50 MG tablet Take 1 tablet (50 mg total) by mouth daily. For treatment of high blood pressure and for your heart. 09/03/17  Yes [name], MD  rosuvastatin (CRESTOR) 20 MG tablet Take 1 tablet (20 mg total) by mouth daily. 09/03/17  Yes [name], MD  albuterol (PROVENTIL HFA;VENTOLIN HFA) 108 (90 BASE) MCG/ACT inhaler Inhale 2 puffs into the lungs every 6 (six) hours as needed for wheezing or shortness of breath. 01/21/12   Kathie Dike, MD  EPIPEN 2-PAK 0.3 MG/0.3ML SOAJ injection Inject 0.3 mg as directed daily as needed. Allergic reaction 10/08/14   [provider]  nitroGLYCERIN (NITROSTAT) 0.4 MG SL tablet Place 1 tablet (0.4 mg total) under the tongue every 5 (five) minutes as needed for chest pain (3 doses MAX). 09/03/17   [name], MD     Allergies:     Allergies  Allergen Reactions  . Bee Venom Swelling    SWELLING REACTION UNSPECIFIED      Physical Exam:   Vitals  Blood pressure (!) 153/91, pulse (!) 56, resp. rate 20, height 6' (1.829 m), weight 81.6 kg, SpO2 99 %.  Physical Examination: General appearance - alert, well appearing, and in no distress  Mental status - alert, oriented to person, place, and time,  Eyes - sclera anicteric Neck - supple, no JVD elevation , Chest - clear  to auscultation bilaterally, symmetrical air movement,  Heart - S1 and S2 normal, consistently reproducible left-sided chest wall tenderness, reproducible with palpation, reproducible with positional change and range of motion. Abdomen - soft, nontender, nondistended, no masses or organomegaly Neurological - screening mental status exam normal, neck supple without rigidity, cranial nerves II through XII intact, DTR's normal and symmetric Extremities - no pedal edema noted, intact peripheral pulses  Skin - warm, dry     Data Review:    CBC Recent  Labs  Lab 12/11/17 0739  WBC 5.0  HGB 12.9*  HCT 37.7*  PLT 292  MCV 86.3  MCH 29.5  MCHC 34.2  RDW 15.3  LYMPHSABS 2.0  MONOABS 0.7  EOSABS 0.2  BASOSABS 0.0   ------------------------------------------------------------------------------------------------------------------  Chemistries  Recent Labs  Lab 12/11/17 0739  NA 139  K 3.9  CL 105  CO2 26  GLUCOSE 107*  BUN 10  CREATININE 1.05  CALCIUM 9.6  AST 20  ALT 19  ALKPHOS 73  BILITOT 0.5   ------------------------------------------------------------------------------------------------------------------ estimated creatinine clearance is 82.1 mL/min (by C-G formula based on SCr of 1.05 mg/dL). ------------------------------------------------------------------------------------------------------------------ No results for input(s): TSH, T4TOTAL, T3FREE, THYROIDAB in the last 72 hours.  Invalid input(s): FREET3   Coagulation profile No results for input(s): INR, PROTIME in the last 168 hours. ------------------------------------------------------------------------------------------------------------------- No results for input(s): DDIMER in the last 72 hours. -------------------------------------------------------------------------------------------------------------------  Cardiac Enzymes Recent Labs  Lab 12/11/17 0739  TROPONINI <0.03   ------------------------------------------------------------------------------------------------------------------ No results found for: BNP   ---------------------------------------------------------------------------------------------------------------  Urinalysis    Component Value Date/Time   COLORURINE YELLOW 10/03/2017 [place] 10/03/2017 1356   LABSPEC 1.025 10/03/2017 1356   PHURINE 5.0 10/03/2017 1356   GLUCOSEU NEGATIVE 10/03/2017 1356   HGBUR LARGE (A) 10/03/2017 1356   BILIRUBINUR NEGATIVE 10/03/2017 1356   KETONESUR NEGATIVE  10/03/2017 1356   PROTEINUR NEGATIVE 10/03/2017 1356   UROBILINOGEN 0.2 12/16/2013 2148   NITRITE NEGATIVE 10/03/2017 1356   LEUKOCYTESUR NEGATIVE 10/03/2017 1356    ----------------------------------------------------------------------------------------------------------------   Imaging Results:    Dg Chest 2 View  Result Date: 12/11/2017 CLINICAL DATA:  Intermittent chest pain. EXAM: CHEST - 2 VIEW COMPARISON:  Two-view chest x-ray 08/07/2017 FINDINGS: The heart size and mediastinal contours are within normal limits. Both lungs are clear. The visualized skeletal structures are unremarkable. IMPRESSION: No active cardiopulmonary disease. Electronically Signed   By: San Morelle [name]   On: 12/11/2017 08:31    Radiological Exams on Admission: Dg Chest 2 View  Result Date: 12/11/2017 CLINICAL DATA:  Intermittent chest pain. EXAM: CHEST - 2 VIEW COMPARISON:  Two-view chest x-ray 08/07/2017 FINDINGS: The heart size and mediastinal contours are within normal limits. Both lungs are clear. The visualized skeletal structures are unremarkable. IMPRESSION: No active cardiopulmonary disease. Electronically Signed   By: San Morelle [name]   On: 12/11/2017 08:31    DVT Prophylaxis -SCD/heparin AM Labs Ordered, also please review Full Orders  Family Communication: Admission, patients condition and plan of care including tests being ordered have been discussed with the patient who indicate understanding and agree with the plan   Code Status - Full Code  Likely DC to  home  Condition   stable  Roxan Hockey M.D on 12/11/2017 at 10:53 AM Pager---[phone] Go to [email or web address] - password [email or web address] for contact info  [facility] - Office  [phone]

## 2017-12-11 NOTE — ED Notes (Signed)
EDP at bedside updating patient and family. 

## 2017-12-11 NOTE — ED Notes (Signed)
Patient transported to X-ray 

## 2017-12-12 LAB — HIV ANTIBODY (ROUTINE TESTING W REFLEX): HIV Screen 4th Generation wRfx: NONREACTIVE

## 2017-12-12 LAB — BASIC METABOLIC PANEL
ANION GAP: 6 (ref 5–15)
BUN: 11 mg/dL (ref 6–20)
CHLORIDE: 105 mmol/L (ref 98–111)
CO2: 27 mmol/L (ref 22–32)
CREATININE: 1 mg/dL (ref 0.61–1.24)
Calcium: 8.9 mg/dL (ref 8.9–10.3)
GFR calc non Af Amer: >60 mL/min (ref >60)
Glucose, Bld: 113 mg/dL — ABNORMAL HIGH (ref 70–99)
POTASSIUM: 3.8 mmol/L (ref 3.5–5.1)
SODIUM: 138 mmol/L (ref 135–145)

## 2017-12-12 LAB — CBC
HEMATOCRIT: 35 % — AB (ref 39.0–52.0)
Hemoglobin: 12.3 g/dL — ABNORMAL LOW (ref 13.0–17.0)
MCH: 30.8 pg (ref 26.0–34.0)
MCHC: 35.1 g/dL (ref 30.0–36.0)
MCV: 87.5 fL (ref 80.0–100.0)
NRBC: 0 % (ref 0.0–0.2)
Platelets: 290 10*3/uL (ref 150–400)
RBC: 4 MIL/uL — ABNORMAL LOW (ref 4.22–5.81)
RDW: 15 % (ref 11.5–15.5)
WBC: 4.3 10*3/uL (ref 4.0–10.5)

## 2017-12-12 MED ORDER — METHOCARBAMOL 500 MG PO TABS: 500.0000 mg | ORAL_TABLET | Freq: Three times a day (TID) | ORAL | 0 refills | Status: DC

## 2017-12-12 MED ORDER — LOSARTAN POTASSIUM 50 MG PO TABS: 50.0000 mg | ORAL_TABLET | Freq: Every day | ORAL | 3 refills | Status: DC

## 2017-12-12 MED ORDER — METOPROLOL TARTRATE 25 MG PO TABS: 25.0000 mg | ORAL_TABLET | Freq: Two times a day (BID) | ORAL | 2 refills | Status: DC

## 2017-12-12 MED ORDER — ROSUVASTATIN CALCIUM 20 MG PO TABS: 20.0000 mg | ORAL_TABLET | Freq: Every day | ORAL | 3 refills | Status: DC

## 2017-12-12 MED ORDER — SENNOSIDES-DOCUSATE SODIUM 8.6-50 MG PO TABS: 2.0000 | ORAL_TABLET | Freq: Every day | ORAL | 1 refills | Status: DC

## 2017-12-12 MED ORDER — ASPIRIN 81 MG PO TBEC: 81.0000 mg | DELAYED_RELEASE_TABLET | Freq: Every day | ORAL | 6 refills | Status: AC

## 2017-12-12 MED ORDER — AMLODIPINE BESYLATE 10 MG PO TABS: 10.0000 mg | ORAL_TABLET | Freq: Every day | ORAL | 3 refills | Status: DC

## 2017-12-12 NOTE — Discharge Instructions (Signed)
1) continue to abstain from tobacco/smoking 2) take all your medications as prescribed 3) follow-up with your primary care physician within a week for recheck/reevaluation 4) follow-up with cardiology as an outpatient next 2 to 3 weeks for reevaluation and recheck 5)Avoid ibuprofen/Advil/Aleve/Motrin/Goody Powders/Naproxen/BC powders/Meloxicam/Diclofenac/Indomethacin and other Nonsteroidal anti-inflammatory medications as these will make you more likely to bleed and can cause stomach ulcers, can also cause Kidney problems.

## 2017-12-12 NOTE — Plan of Care (Signed)
Adequate for discharge.

## 2017-12-12 NOTE — Discharge Summary (Signed)
Joshua Hall, is a 62 y.o. male  DOB 20-May-1955  MRN [id number].  Admission date:  12/11/2017  Admitting Physician  Roxan Hockey, MD  Discharge Date:  12/12/2017   Primary MD  Celene Squibb, MD  Recommendations for primary care physician for things to follow:   1) continue to abstain from tobacco/smoking 2) take all your medications as prescribed 3) follow-up with your primary care physician within a week for recheck/reevaluation 4) follow-up with cardiology as an outpatient next 2 to 3 weeks for reevaluation and recheck 5)Avoid ibuprofen/Advil/Aleve/Motrin/Goody Powders/Naproxen/BC powders/Meloxicam/Diclofenac/Indomethacin and other Nonsteroidal anti-inflammatory medications as these will make you more likely to bleed and can cause stomach ulcers, can also cause Kidney problems.    Admission Diagnosis  CP W HISTORY   Discharge Diagnosis  CP W HISTORY    Active Problems:   Chest pain      Past Medical History:  Diagnosis Date  . Colitis   . COPD (chronic obstructive pulmonary disease) ([place])   . Coronary atherosclerosis of native coronary artery    a. cath in 2013 showing nonobstructive disease along the LAD and RCA with 99% stenosis of D2 --> too small for intervention, medical therapy recommended b. normal NST in 10/2013 c. 04/2017 cath showing 90% distal-LAD stenosis and occluded D2 with medical management recommended  . Hyperlipidemia   . Hypertension   . Myocardial infarction [facility]) ?2013; ?2015  . PAD (peripheral artery disease) ([place])    a. s/p stent placement to the left SFA in 2015  . Tobacco use     Past Surgical History:  Procedure Laterality Date  . ABDOMINAL AORTAGRAM N/A 12/23/2013   Procedure: ABDOMINAL Maxcine Ham;  Surgeon: [name], MD;  Location: [place] CATH LAB;  Service: Cardiovascular;  Laterality: N/A;  . ABDOMINAL AORTOGRAM N/A 09/18/2017   Procedure: ABDOMINAL  AORTOGRAM;  Surgeon: [name], MD;  Location: [place] CV LAB;  Service: Cardiovascular;  Laterality: N/A;  . CARDIAC CATHETERIZATION  10/2011   [place]  . COLONOSCOPY N/A 03/16/2015   SLF: 1. No source for abdominal pain identified. 2. six colorectal polyps removed. 3. the colon was redundant 4. mild diverticulosis noted in the sigmoid colon 5. small internal hemorroids.   . ESOPHAGOGASTRODUODENOSCOPY N/A 03/16/2015   SLF: 1. No source for abdominal pain identified. 2. mild non-erosive gastritis and duodentitis.   . FEMORAL-POPLITEAL BYPASS GRAFT Left 10/08/2017   Procedure: Left  FEMORAL-POPLITEAL ARTERY Bypass Graft;  Surgeon: Waynetta Sandy, MD;  Location: [place];  Service: Vascular;  Laterality: Left;  . HERNIA REPAIR    . INSERTION OF MESH N/A 04/13/2015   Procedure: INSERTION OF MESH;  Surgeon: [name], MD;  Location: [place];  Service: General;  Laterality: N/A;  . LEFT HEART CATH AND CORONARY ANGIOGRAPHY N/A 05/01/2017   Procedure: LEFT HEART CATH AND CORONARY ANGIOGRAPHY;  Surgeon: [name], MD;  Location: [place] CV LAB;  Service: Cardiovascular;  Laterality: N/A;  . LEFT HEART CATHETERIZATION WITH CORONARY ANGIOGRAM  N/A 10/19/2011   Procedure: LEFT HEART CATHETERIZATION WITH CORONARY ANGIOGRAM;  Surgeon: [name], MD;  Location: [facility] CATH LAB;  Service: Cardiovascular;  Laterality: N/A;  . LOWER EXTREMITY ANGIOGRAPHY Left 09/18/2017   Procedure: Lower Extremity Angiography;  Surgeon: [name], MD;  Location: [place] CV LAB;  Service: Cardiovascular;  Laterality: Left;  . MOUTH SURGERY     "all my teeth removed then cosmetic OR for my dentures to fit"  . UMBILICAL HERNIA REPAIR N/A 04/13/2015   Procedure:  UMBILICAL HERNIA REPAIR;  Surgeon: [name], MD;  Location: [place];  Service: General;  Laterality: N/A;       HPI  from the history and physical done on the day of admission:     Joshua Hall  is a 62 y.o. male with history of HTN,  tobacco abuse (last smoked 10/2017), PAD with prior SFA stenting for stenosis , and subsequent above-the-knee femoropopliteal bypass in 09/2017 , history of CAD with cardiac catheterization in 04/2017 showing 90% distal LAD stenosis close to the apex, occluded second diagonal and no other obstructive disease. LVEDP was normal and ejection fraction was 55 to 60%. The distal LAD was too small to stent etiology recommended medical therapy, as well as history of dyslipidemia with LDL of 97 who presents to the ED on 12/11/2017 with intermittent left-sided chest discomfort.  On further questioning patient states that he has had intermittent chest discomfort over the last week however yesterday he was using the tractor to mow and cut grass/[place].... Later in the evening of 12/10/2017 after he got out of the tractor he had left-sided chest discomfort that was sharp from time to time intermittent usually with positional change.  No pleuritic symptoms no leg swelling, no productive cough, no fever no chills, chest discomfort does not radiate anywhere, no nausea no vomiting no dizziness no palpitations no shortness of breath no dyspnea on exertion.  No headaches no visual disturbance no TIA or strokelike symptoms  In the ED EKG and troponin negative, exam revealed consistent reproducible chest wall tenderness in the same area, also  chest wall pain and tenderness is consistently reproducible with positional change/consistently reproducible left-sided chest wall tenderness, reproducible with palpation, reproducible with positional change and range of motion.   EDP discussed case with on-call cardiologist, despite atypical nature of chest pain given the patient's cardiac history we will keep him overnight for ACS rule out     Hospital Course:     1)Atypical Chest Pain-  H/o CAD and PAD, consistently reproducible left-sided chest wall tenderness,  reproducible with palpation, reproducible with positional change and range of motion. Patient ruled out for ACS by cardiac enzymes and EKG,  on  telemetry monitored unit no significant arrhythmia noted, .  I okay to discharge home and follow-up with cardiology as outpatient, okay to continue aspirin, Crestor, nitroglycerin, and metoprolol...  May take Robaxin for presumed musculoskeletal chest wall pain  2)H/o CAD- cardiac catheterization in 04/2017 showing 90% distal LAD stenosis close to the apex, occluded second diagonal and no other obstructive disease. LVEDP was normal and ejection fraction was 55 to 60%. The distal LAD was too small to stent etiology recommended medical therapy, continue aspirin, Crestor, added metoprolol and nitroglycerin  3)H/o PAD--  with prior SFA stenting for stenosis , and subsequent above-the-knee femoropopliteal bypass in 09/2017 , continue aspirin and Crestor,   4)HLD--LDL goal is less than 70 given PAD and CAD, continue Crestor,  last LDL was 97  5) tobacco abuse--- patient states that he has not smoked since September 2019, congratulated on smoking cessation ,, encouraged to stay abstinent from tobacco  6)HTN---  restart losartan and amlodipine, metoprolol added, nitroglycerin added  Discharge Condition: stable  Follow UP--outpatient follow-up with cardiologist  Diet and Activity recommendation:  As advised  Discharge Instructions    Discharge Instructions    Call MD for:  difficulty breathing, headache or visual disturbances   Complete by:  As directed    Call MD for:  persistant dizziness or light-headedness   Complete by:  As directed    Call MD for:  persistant nausea and vomiting   Complete by:  As directed    Call MD for:  severe uncontrolled pain   Complete by:  As directed    Call MD for:  temperature >100.4   Complete by:  As directed    Diet - low sodium heart healthy   Complete by:  As directed    Discharge instructions   Complete  by:  As directed    1) continue to abstain from tobacco/smoking 2) take all your medications as prescribed 3) follow-up with your primary care physician within a week for recheck/reevaluation 4) follow-up with cardiology as an outpatient next 2 to 3 weeks for reevaluation and recheck 5)Avoid ibuprofen/Advil/Aleve/Motrin/Goody Powders/Naproxen/BC powders/Meloxicam/Diclofenac/Indomethacin and other Nonsteroidal anti-inflammatory medications as these will make you more likely to bleed and can cause stomach ulcers, can also cause Kidney problems.   Increase activity slowly   Complete by:  As directed         Discharge Medications     Allergies as of 12/12/2017      Reactions   Bee Venom Swelling   SWELLING REACTION UNSPECIFIED       Medication List    TAKE these medications   albuterol 108 (90 Base) MCG/ACT inhaler Commonly known as:  PROVENTIL HFA;VENTOLIN HFA Inhale 2 puffs into the lungs every 6 (six) hours as needed for wheezing or shortness of breath.   amLODipine 10 MG tablet Commonly known as:  NORVASC Take 1 tablet (10 mg total) by mouth daily.   aspirin 81 MG EC tablet Take 1 tablet (81 mg total) by mouth daily. With food What changed:  additional instructions   EPIPEN 2-PAK 0.3 mg/0.3 mL Soaj injection Generic drug:  EPINEPHrine Inject 0.3 mg as directed daily as needed. Allergic reaction   losartan 50 MG tablet Commonly known as:  COZAAR Take 1 tablet (50 mg total) by mouth daily. For treatment of high blood pressure and for your heart.   methocarbamol 500 MG tablet Commonly known as:  ROBAXIN Take 1 tablet (500 mg total) by mouth 3 (three) times daily.   metoprolol tartrate 25 MG tablet Commonly known as:  LOPRESSOR Take 1 tablet (25 mg total) by mouth 2 (two) times daily.   nitroGLYCERIN 0.4 MG SL tablet Commonly known as:  NITROSTAT Place 1 tablet (0.4 mg total) under the tongue every 5 (five) minutes as needed for chest pain (3 doses MAX).     rosuvastatin 20 MG tablet Commonly known as:  CRESTOR Take 1 tablet (20 mg total) by mouth daily.   senna-docusate 8.6-50 MG tablet Commonly known as:  Senokot-S Take 2 tablets by mouth at bedtime.       Major procedures and Radiology Reports - PLEASE review detailed and final reports for all details, in brief -   Dg Chest 2 View  Result Date: 12/11/2017 CLINICAL DATA:  Intermittent chest pain.  EXAM: CHEST - 2 VIEW COMPARISON:  Two-view chest x-ray 08/07/2017 FINDINGS: The heart size and mediastinal contours are within normal limits. Both lungs are clear. The visualized skeletal structures are unremarkable. IMPRESSION: No active cardiopulmonary disease. Electronically Signed   By: San Morelle [name]   On: 12/11/2017 08:31    Micro Results   Today   Subjective    Edsel Petrin today has no complaints, no pleuritic symptoms, no leg pains no leg swelling no dyspnea on exertion no palpitations no dizziness          Patient has been seen and examined prior to discharge   Objective   Blood pressure 139/90, pulse 67, temperature 98.4 F (36.9 C), temperature source Oral, resp. rate 14, height 6' (1.829 m), weight 81.6 kg, SpO2 100 %.   Intake/Output Summary (Last 24 hours) at 12/12/2017 1149 Last data filed at 12/11/2017 1700 Gross per 24 hour  Intake 480 ml  Output -  Net 480 ml    Exam Physical Examination: General appearance - alert, well appearing, and in no distress  Mental status - alert, oriented to person, place, and time,  Eyes - sclera anicteric Neck - supple, no JVD elevation , Chest - clear  to auscultation bilaterally, symmetrical air movement,  Heart - S1 and S2 normal, consistently reproducible left-sided chest wall tenderness, reproducible with palpation, reproducible with positional change and range of motion. Abdomen - soft, nontender, nondistended, no masses or organomegaly Neurological - screening mental status exam normal, neck supple  without rigidity, cranial nerves II through XII intact, DTR's normal and symmetric Extremities - no pedal edema noted, intact peripheral pulses  Skin - warm, dry    Data Review   CBC w Diff:  Lab Results  Component Value Date   WBC 4.3 12/12/2017   HGB 12.3 (L) 12/12/2017   HGB 14.9 09/03/2017   HCT 35.0 (L) 12/12/2017   HCT 43.5 09/03/2017   PLT 290 12/12/2017   PLT 310 09/03/2017   LYMPHOPCT 39 12/11/2017   MONOPCT 13 12/11/2017   EOSPCT 4 12/11/2017   BASOPCT 1 12/11/2017    CMP:  Lab Results  Component Value Date   NA 138 12/12/2017   NA 140 09/03/2017   K 3.8 12/12/2017   CL 105 12/12/2017   CO2 27 12/12/2017   BUN 11 12/12/2017   BUN 8 09/03/2017   CREATININE 1.00 12/12/2017   PROT 7.2 12/11/2017   ALBUMIN 4.0 12/11/2017   BILITOT 0.5 12/11/2017   ALKPHOS 73 12/11/2017   AST 20 12/11/2017   ALT 19 12/11/2017  .   Total Discharge time is about 33 minutes  Roxan Hockey M.D on 12/12/2017 at 11:49 AM  Pager---[phone]  Go to [email or web address] - password [email or web address] for contact info  [facility] - Office  [phone]

## 2017-12-14 NOTE — ED Provider Notes (Signed)
[facility] Provider Note   CSN: [id number] Arrival date & time: 12/11/17  0730     History   Chief Complaint Chief Complaint  Patient presents with  . Chest Pain    HPI Joshua Hall is a 62 y.o. male.  Pt with chest pain off and on  The history is provided by the patient. No language interpreter was used.  Chest Pain   This is a recurrent problem. The current episode started 2 days ago. The problem occurs constantly. The problem has been resolved. The pain is associated with exertion. The pain is present in the substernal region. The pain is at a severity of 4/10. The pain is moderate. The quality of the pain is described as dull. The pain does not radiate. Pertinent negatives include no abdominal pain, no back pain, no cough and no headaches.  Pertinent negatives for past medical history include no seizures.    Past Medical History:  Diagnosis Date  . Colitis   . COPD (chronic obstructive pulmonary disease) ([place])   . Coronary atherosclerosis of native coronary artery    a. cath in 2013 showing nonobstructive disease along the LAD and RCA with 99% stenosis of D2 --> too small for intervention, medical therapy recommended b. normal NST in 10/2013 c. 04/2017 cath showing 90% distal-LAD stenosis and occluded D2 with medical management recommended  . Hyperlipidemia   . Hypertension   . Myocardial infarction [facility]) ?2013; ?2015  . PAD (peripheral artery disease) ([place])    a. s/p stent placement to the left SFA in 2015  . Tobacco use     Patient Active Problem List   Diagnosis Date Noted  . Chest pain 08/07/2017  . Hypertension 08/07/2017  . COPD (chronic obstructive pulmonary disease) ([place]) 05/01/2017  . Claudication of both lower extremities ([place]) 05/01/2017  . Closed fracture of shaft of right ulna with routine healing 11/10/16 01/24/2017  . Tremor of right hand 09/06/2016  . Colon polyps 08/04/2015  . Dyspepsia   . Diarrhea 02/17/2015  .  Drug-induced erectile dysfunction 04/25/2014  . PAD (peripheral artery disease) ([place]) 12/15/2013  . Substernal chest pain 11/03/2013  . Unstable angina ([place]) 11/03/2013  . Tobacco use 11/03/2013  . Periumbilical pain 55/73/2202  . Essential hypertension, benign 01/20/2012  . Coronary atherosclerosis of native coronary artery 10/26/2011  . Mixed hyperlipidemia 10/20/2011    Past Surgical History:  Procedure Laterality Date  . ABDOMINAL AORTAGRAM N/A 12/23/2013   Procedure: ABDOMINAL Maxcine Ham;  Surgeon: [name], MD;  Location: [place] CATH [facility];  Service: Cardiovascular;  Laterality: N/A;  . ABDOMINAL AORTOGRAM N/A 09/18/2017   Procedure: ABDOMINAL AORTOGRAM;  Surgeon: [name], MD;  Location: [place] CV [facility];  Service: Cardiovascular;  Laterality: N/A;  . CARDIAC CATHETERIZATION  10/2011   [place]  . COLONOSCOPY N/A 03/16/2015   SLF: 1. No source for abdominal pain identified. 2. six colorectal polyps removed. 3. the colon was redundant 4. mild diverticulosis noted in the sigmoid colon 5. small internal hemorroids.   . ESOPHAGOGASTRODUODENOSCOPY N/A 03/16/2015   SLF: 1. No source for abdominal pain identified. 2. mild non-erosive gastritis and duodentitis.   . FEMORAL-POPLITEAL BYPASS GRAFT Left 10/08/2017   Procedure: Left  FEMORAL-POPLITEAL ARTERY Bypass Graft;  Surgeon: Waynetta Sandy, MD;  Location: [facility];  Service: Vascular;  Laterality: Left;  . HERNIA REPAIR    . INSERTION OF MESH N/A 04/13/2015   Procedure: INSERTION OF MESH;  Surgeon: [name],  MD;  Location: [facility];  Service: General;  Laterality: N/A;  . LEFT HEART CATH AND CORONARY ANGIOGRAPHY N/A 05/01/2017   Procedure: LEFT HEART CATH AND CORONARY ANGIOGRAPHY;  Surgeon: [name], MD;  Location: [place] CV [facility];  Service: Cardiovascular;  Laterality: N/A;  . LEFT HEART CATHETERIZATION WITH CORONARY ANGIOGRAM N/A 10/19/2011   Procedure: LEFT HEART  CATHETERIZATION WITH CORONARY ANGIOGRAM;  Surgeon: [name], MD;  Location: [facility] CATH LAB;  Service: Cardiovascular;  Laterality: N/A;  . LOWER EXTREMITY ANGIOGRAPHY Left 09/18/2017   Procedure: Lower Extremity Angiography;  Surgeon: [name], MD;  Location: [place] [facility] LAB;  Service: Cardiovascular;  Laterality: Left;  . MOUTH SURGERY     "all my teeth removed then cosmetic OR for my dentures to fit"  . UMBILICAL HERNIA REPAIR N/A 04/13/2015   Procedure:  UMBILICAL HERNIA REPAIR;  Surgeon: [name], MD;  Location: [facility];  Service: General;  Laterality: N/A;        Home Medications    Prior to Admission medications   Medication Sig Start Date End Date Taking? Authorizing Provider  albuterol (PROVENTIL HFA;VENTOLIN HFA) 108 (90 BASE) MCG/ACT inhaler Inhale 2 puffs into the lungs every 6 (six) hours as needed for wheezing or shortness of breath. 01/21/12   Kathie Dike, MD  amLODipine (NORVASC) 10 MG tablet Take 1 tablet (10 mg total) by mouth daily. 12/12/17 01/11/18  Roxan Hockey, MD  aspirin 81 MG EC tablet Take 1 tablet (81 mg total) by mouth daily. With food 12/12/17   Roxan Hockey, MD  EPIPEN 2-PAK 0.3 MG/0.3ML SOAJ injection Inject 0.3 mg as directed daily as needed. Allergic reaction 10/08/14   [provider]  losartan (COZAAR) 50 MG tablet Take 1 tablet (50 mg total) by mouth daily. For treatment of high blood pressure and for your heart. 12/12/17   Roxan Hockey, MD  methocarbamol (ROBAXIN) 500 MG tablet Take 1 tablet (500 mg total) by mouth 3 (three) times daily. 12/12/17   Roxan Hockey, MD  metoprolol tartrate (LOPRESSOR) 25 MG tablet Take 1 tablet (25 mg total) by mouth 2 (two) times daily. 12/12/17   Roxan Hockey, MD  nitroGLYCERIN (NITROSTAT) 0.4 MG SL tablet Place 1 tablet (0.4 mg total) under the tongue every 5 (five) minutes as needed for chest pain (3 doses MAX). 09/03/17   [name], MD  rosuvastatin  (CRESTOR) 20 MG tablet Take 1 tablet (20 mg total) by mouth daily. 12/12/17   Roxan Hockey, MD  senna-docusate (SENOKOT-S) 8.6-50 MG tablet Take 2 tablets by mouth at bedtime. 12/12/17 12/12/18  Roxan Hockey, MD    Family History Family History  Problem Relation Age of Onset  . Diabetes Mother   . Diabetes Father   . Stroke Father   . Colon cancer Maternal Uncle   . Heart disease Brother        Died of MI at 3    Social History Social History   Tobacco Use  . Smoking status: Former Smoker    Packs/day: 1.00    Years: 47.00    Pack years: 47.00    Types: Cigarettes    Last attempt to quit: 09/26/2017    Years since quitting: 0.2  . Smokeless tobacco: Never Used  . Tobacco comment: 10/10/2017 "quit a couple weeks ago; smoked couple cigarettes/day recently; used to smoke 1ppd"  Substance Use Topics  . Alcohol use: Yes    Alcohol/week: 3.0 standard drinks  Types: 1 Glasses of wine, 2 Cans of beer per week    Comment: occ  . Drug use: Not Currently     Allergies   Bee venom   Review of Systems Review of Systems  Constitutional: Negative for appetite change and fatigue.  HENT: Negative for congestion, ear discharge and sinus pressure.   Eyes: Negative for discharge.  Respiratory: Negative for cough.   Cardiovascular: Positive for chest pain.  Gastrointestinal: Negative for abdominal pain and diarrhea.  Genitourinary: Negative for frequency and hematuria.  Musculoskeletal: Negative for back pain.  Skin: Negative for rash.  Neurological: Negative for seizures and headaches.  Psychiatric/Behavioral: Negative for hallucinations.     Physical Exam Updated Vital Signs BP 139/90   Pulse 67   Temp 98.4 F (36.9 C) (Oral)   Resp 14   Ht 6' (1.829 m)   Wt 81.6 kg   SpO2 100%   BMI 24.41 kg/m   Physical Exam  Constitutional: He is oriented to person, place, and time. He appears well-developed.  HENT:  Head: Normocephalic.  Eyes: Conjunctivae and EOM  are normal. No scleral icterus.  Neck: Neck supple. No thyromegaly present.  Cardiovascular: Normal rate and regular rhythm. Exam reveals no gallop and no friction rub.  No murmur heard. Pulmonary/Chest: No stridor. He has no wheezes. He has no rales. He exhibits no tenderness.  Abdominal: He exhibits no distension. There is no tenderness. There is no rebound.  Musculoskeletal: Normal range of motion. He exhibits no edema.  Lymphadenopathy:    He has no cervical adenopathy.  Neurological: He is oriented to person, place, and time. He exhibits normal muscle tone. Coordination normal.  Skin: No rash noted. No erythema.  Psychiatric: He has a normal mood and affect. His behavior is normal.     ED Treatments / Results  Labs (all labs ordered are listed, but only abnormal results are displayed) Labs Reviewed  CBC WITH DIFFERENTIAL/PLATELET - Abnormal; Notable for the following components:      Result Value   Hemoglobin 12.9 (*)    HCT 37.7 (*)    All other components within normal limits  COMPREHENSIVE METABOLIC PANEL - Abnormal; Notable for the following components:   Glucose, Bld 107 (*)    All other components within normal limits  BASIC METABOLIC PANEL - Abnormal; Notable for the following components:   Glucose, Bld 113 (*)    All other components within normal limits  CBC - Abnormal; Notable for the following components:   RBC 4.00 (*)    Hemoglobin 12.3 (*)    HCT 35.0 (*)    All other components within normal limits  MRSA PCR SCREENING  TROPONIN I  TROPONIN I  TROPONIN I  HIV ANTIBODY (ROUTINE TESTING W REFLEX)  I-STAT TROPONIN, ED    EKG EKG Interpretation  Date/Time:  Wednesday December 11 2017 07:38:37 EDT Ventricular Rate:  61 PR Interval:    QRS Duration: 89 QT Interval:  443 QTC Calculation: 447 R Axis:   57 Text Interpretation:  Sinus rhythm Reconfirmed by Milton Ferguson [phone]) on 12/11/2017 9:18:58 AM   Radiology No results  found.  Procedures Procedures (including critical care time)  Medications Ordered in ED Medications  nitroGLYCERIN (NITROGLYN) 2 % ointment 1 inch (1 inch Topical Given 12/12/17 0558)     Initial Impression / Assessment and Plan / ED Course  I have reviewed the triage vital signs and the nursing notes.  Pertinent labs & imaging results that were available during my  care of the patient were reviewed by me and considered in my medical decision making (see chart for details).     Pt will be admitted for chest pain work up with hospitalist Final Clinical Impressions(s) / ED Diagnoses   Final diagnoses:  Atypical chest pain    ED Discharge Orders         Ordered    aspirin 81 MG EC tablet  Daily     12/12/17 1009    amLODipine (NORVASC) 10 MG tablet  Daily     12/12/17 1009    losartan (COZAAR) 50 MG tablet  Daily     12/12/17 1009    rosuvastatin (CRESTOR) 20 MG tablet  Daily     12/12/17 1009    metoprolol tartrate (LOPRESSOR) 25 MG tablet  2 times daily     12/12/17 1009    methocarbamol (ROBAXIN) 500 MG tablet  3 times daily     12/12/17 1009    senna-docusate (SENOKOT-S) 8.6-50 MG tablet  Daily at bedtime     12/12/17 1009    Increase activity slowly     12/12/17 1148    Diet - low sodium heart healthy     12/12/17 1148    Discharge instructions    Comments:  1) continue to abstain from tobacco/smoking 2) take all your medications as prescribed 3) follow-up with your primary care physician within a week for recheck/reevaluation 4) follow-up with cardiology as an outpatient next 2 to 3 weeks for reevaluation and recheck 5)Avoid ibuprofen/Advil/Aleve/Motrin/Goody Powders/Naproxen/BC powders/Meloxicam/Diclofenac/Indomethacin and other Nonsteroidal anti-inflammatory medications as these will make you more likely to bleed and can cause stomach ulcers, can also cause Kidney problems.   12/12/17 1148    Call MD for:  temperature >100.4     12/12/17 1148    Call MD for:   persistant nausea and vomiting     12/12/17 1148    Call MD for:  severe uncontrolled pain     12/12/17 1148    Call MD for:  difficulty breathing, headache or visual disturbances     12/12/17 1148    Call MD for:  persistant dizziness or light-headedness     12/12/17 1148           Milton Ferguson, MD 12/14/17 [phone]

## 2018-01-01 DIAGNOSIS — Z0279 Encounter for issue of other medical certificate: Secondary | ICD-10-CM | POA: Diagnosis not present

## 2018-01-22 ENCOUNTER — Encounter: Payer: PRIVATE HEALTH INSURANCE | Admitting: *Deleted

## 2018-01-22 DIAGNOSIS — Z006 Encounter for examination for normal comparison and control in clinical research program: Secondary | ICD-10-CM

## 2018-01-22 NOTE — Research (Signed)
Subject met inclusion and exclusion criteria.  The informed consent form, study requirements and expectations were reviewed with the subject and questions and concerns were addressed prior to the signing of the consent form.  The subject verbalized understanding of the trial requirements.  The subject agreed to participate in the [email or web address]  trial and signed the informed consent.  The informed consent was obtained prior to performance of any protocol-specific procedures for the subject.  A copy of the signed informed consent was given to the subject and a copy was placed in the subject's medical record.  Screening visit completed and subject started in run-in phase.  Next appointment scheduled. 

## 2018-01-23 ENCOUNTER — Encounter: Payer: Self-pay | Admitting: *Deleted

## 2018-01-23 DIAGNOSIS — Z006 Encounter for examination for normal comparison and control in clinical research program: Secondary | ICD-10-CM

## 2018-01-23 NOTE — Research (Signed)
Late entry:  Subject visit 01/22/2018 Subject met inclusion and exclusion criteria. The informed consent form, study requirements and expectations were reviewed with the subject and questions and concerns were addressed prior to the signing of the consent form. The subject verbalized understanding of the trial requirements. The subject agreed to participate in the [facility] and signed the informed consent. The informed consent was obtained prior to performance of any protocol-specific procedures for the subject. A copy of the signed informed consent was given to the subject and a copy was placed in the subject's medical record.  Subject screened and randomization appointment scheduled.

## 2018-01-31 ENCOUNTER — Ambulatory Visit: Payer: Self-pay | Admitting: Vascular Surgery

## 2018-01-31 ENCOUNTER — Ambulatory Visit (HOSPITAL_COMMUNITY): Payer: Self-pay

## 2018-01-31 ENCOUNTER — Ambulatory Visit (HOSPITAL_COMMUNITY): Payer: PRIVATE HEALTH INSURANCE | Attending: Internal Medicine

## 2018-02-03 ENCOUNTER — Encounter: Payer: Self-pay | Admitting: Vascular Surgery

## 2018-03-12 ENCOUNTER — Other Ambulatory Visit: Payer: Self-pay

## 2018-03-12 ENCOUNTER — Encounter (HOSPITAL_COMMUNITY): Payer: Self-pay | Admitting: Emergency Medicine

## 2018-03-12 ENCOUNTER — Emergency Department (HOSPITAL_COMMUNITY)
Admission: EM | Admit: 2018-03-12 | Discharge: 2018-03-12 | Disposition: A | Discharge status: Home / Self Care | Payer: Medicaid Other | Attending: Emergency Medicine | Admitting: Emergency Medicine

## 2018-03-12 ENCOUNTER — Emergency Department (HOSPITAL_COMMUNITY): Payer: Medicaid Other

## 2018-03-12 DIAGNOSIS — Z79899 Other long term (current) drug therapy: Secondary | ICD-10-CM | POA: Insufficient documentation

## 2018-03-12 DIAGNOSIS — I10 Essential (primary) hypertension: Secondary | ICD-10-CM | POA: Diagnosis not present

## 2018-03-12 DIAGNOSIS — F17210 Nicotine dependence, cigarettes, uncomplicated: Secondary | ICD-10-CM | POA: Insufficient documentation

## 2018-03-12 DIAGNOSIS — R079 Chest pain, unspecified: Secondary | ICD-10-CM | POA: Insufficient documentation

## 2018-03-12 DIAGNOSIS — E785 Hyperlipidemia, unspecified: Secondary | ICD-10-CM | POA: Insufficient documentation

## 2018-03-12 LAB — CBC
HEMATOCRIT: 36.9 % — AB (ref 39.0–52.0)
HEMOGLOBIN: 13.1 g/dL (ref 13.0–17.0)
MCH: 30 pg (ref 26.0–34.0)
MCHC: 35.5 g/dL (ref 30.0–36.0)
MCV: 84.6 fL (ref 80.0–100.0)
Platelets: 257 10*3/uL (ref 150–400)
RBC: 4.36 MIL/uL (ref 4.22–5.81)
RDW: 14.4 % (ref 11.5–15.5)
WBC: 5.4 10*3/uL (ref 4.0–10.5)
nRBC: 0 % (ref 0.0–0.2)

## 2018-03-12 LAB — BASIC METABOLIC PANEL WITH GFR
Anion gap: 12 (ref 5–15)
BUN: 15 mg/dL (ref 6–20)
CO2: 21 mmol/L — ABNORMAL LOW (ref 22–32)
Calcium: 8.9 mg/dL (ref 8.9–10.3)
Chloride: 103 mmol/L (ref 98–111)
Creatinine, Ser: 1.14 mg/dL (ref 0.61–1.24)
GFR calc Af Amer: >60 mL/min
GFR calc non Af Amer: >60 mL/min
Glucose, Bld: 220 mg/dL — ABNORMAL HIGH (ref 70–99)
Potassium: 3.2 mmol/L — ABNORMAL LOW (ref 3.5–5.1)
Sodium: 136 mmol/L (ref 135–145)

## 2018-03-12 LAB — TROPONIN I
TROPONIN I: 0.03 ng/mL — AB (ref <0.03)
Troponin I: <0.03 ng/mL

## 2018-03-12 MED ORDER — SODIUM CHLORIDE 0.9% FLUSH
3.0000 mL | Freq: Once | INTRAVENOUS | Status: AC
  Administered 2018-03-12: 3 mL via INTRAVENOUS

## 2018-03-12 MED ORDER — ACETAMINOPHEN 325 MG PO TABS
650.0000 mg | ORAL_TABLET | Freq: Once | ORAL | Status: AC
  Administered 2018-03-12: 650 mg via ORAL
  Filled 2018-03-12: qty 2

## 2018-03-12 NOTE — Discharge Instructions (Signed)
Follow-up with your primary care doctor and also consider seeing your cardiologist and vascular doctor as we discussed.  Return to the emergency room as needed for worsening symptoms

## 2018-03-12 NOTE — ED Notes (Signed)
CRITICAL VALUE ALERT  Critical Value:  Troponin 0.03  Date & Time Notied:  03/12/2018 @ 2979  Provider Notified: Dr Hillard Danker  Orders Received/Actions taken: see new orders.

## 2018-03-12 NOTE — ED Triage Notes (Addendum)
CP on lt side for past few days on and off.  Woke him up at 0600 today and was more frequent.  Pt also reports burning and pain to lt calf with walking.

## 2018-03-12 NOTE — ED Notes (Signed)
Patient transported to X-ray 

## 2018-03-12 NOTE — ED Provider Notes (Signed)
[facility] EMERGENCY DEPARTMENT Provider Note   CSN: [id number] Arrival date & time: 03/12/18  0725     History   Chief Complaint Chief Complaint  Patient presents with  . Chest Pain    HPI Joshua Hall is a 63 y.o. male.  HPI Patient presents to the emergency room for evaluation of chest pain.  Patient states he is had a few episodes over the last few days.  He describes it as a sharp stinging pain on the left side of his chest that last for a few seconds to at most less than a minute.  This morning he had another episode about 6 AM that lasted a little bit longer and he decided to come in to get checked out.  He denies any nausea, shortness of breath or diaphoresis with these episodes.  Patient does have history of coronary artery disease.  Patient also mentions having some burning discomfort in his left calf with walking.  He denies any numbness or weakness.  No swelling.  Patient does continue to smoke but he has tried to quit and reduce his use. Past Medical History:  Diagnosis Date  . Colitis   . COPD (chronic obstructive pulmonary disease) ([place])   . Coronary atherosclerosis of native coronary artery    a. cath in 2013 showing nonobstructive disease along the LAD and RCA with 99% stenosis of D2 --> too small for intervention, medical therapy recommended b. normal NST in 10/2013 c. 04/2017 cath showing 90% distal-LAD stenosis and occluded D2 with medical management recommended  . Hyperlipidemia   . Hypertension   . Myocardial infarction [facility]) ?2013; ?2015  . PAD (peripheral artery disease) ([place])    a. s/p stent placement to the left SFA in 2015  . Tobacco use     Patient Active Problem List   Diagnosis Date Noted  . Chest pain 08/07/2017  . Hypertension 08/07/2017  . COPD (chronic obstructive pulmonary disease) ([place]) 05/01/2017  . Claudication of both lower extremities ([place]) 05/01/2017  . Closed fracture of shaft of right ulna with routine healing 11/10/16  01/24/2017  . Tremor of right hand 09/06/2016  . Colon polyps 08/04/2015  . Dyspepsia   . Diarrhea 02/17/2015  . Drug-induced erectile dysfunction 04/25/2014  . PAD (peripheral artery disease) ([place]) 12/15/2013  . Substernal chest pain 11/03/2013  . Unstable angina ([place]) 11/03/2013  . Tobacco use 11/03/2013  . Periumbilical pain 01/60/1093  . Essential hypertension, benign 01/20/2012  . Coronary atherosclerosis of native coronary artery 10/26/2011  . Mixed hyperlipidemia 10/20/2011    Past Surgical History:  Procedure Laterality Date  . ABDOMINAL AORTAGRAM N/A 12/23/2013   Procedure: ABDOMINAL Maxcine Ham;  Surgeon: [name], MD;  Location: [place] CATH LAB;  Service: Cardiovascular;  Laterality: N/A;  . ABDOMINAL AORTOGRAM N/A 09/18/2017   Procedure: ABDOMINAL AORTOGRAM;  Surgeon: [name], MD;  Location: [place] CV LAB;  Service: Cardiovascular;  Laterality: N/A;  . CARDIAC CATHETERIZATION  10/2011   [place]  . COLONOSCOPY N/A 03/16/2015   SLF: 1. No source for abdominal pain identified. 2. six colorectal polyps removed. 3. the colon was redundant 4. mild diverticulosis noted in the sigmoid colon 5. small internal hemorroids.   . ESOPHAGOGASTRODUODENOSCOPY N/A 03/16/2015   SLF: 1. No source for abdominal pain identified. 2. mild non-erosive gastritis and duodentitis.   . FEMORAL-POPLITEAL BYPASS GRAFT Left 10/08/2017   Procedure: Left  FEMORAL-POPLITEAL ARTERY Bypass Graft;  Surgeon: Waynetta Sandy, MD;  Location: [place];  Service: Vascular;  Laterality: Left;  . HERNIA REPAIR    . INSERTION OF MESH N/A 04/13/2015   Procedure: INSERTION OF MESH;  Surgeon: [name], MD;  Location: [place];  Service: General;  Laterality: N/A;  . LEFT HEART CATH AND CORONARY ANGIOGRAPHY N/A 05/01/2017   Procedure: LEFT HEART CATH AND CORONARY ANGIOGRAPHY;  Surgeon: [name], MD;  Location: [place] CV LAB;  Service: Cardiovascular;   Laterality: N/A;  . LEFT HEART CATHETERIZATION WITH CORONARY ANGIOGRAM N/A 10/19/2011   Procedure: LEFT HEART CATHETERIZATION WITH CORONARY ANGIOGRAM;  Surgeon: [name], MD;  Location: [facility] CATH LAB;  Service: Cardiovascular;  Laterality: N/A;  . LOWER EXTREMITY ANGIOGRAPHY Left 09/18/2017   Procedure: Lower Extremity Angiography;  Surgeon: [name], MD;  Location: [place] CV LAB;  Service: Cardiovascular;  Laterality: Left;  . MOUTH SURGERY     "all my teeth removed then cosmetic OR for my dentures to fit"  . UMBILICAL HERNIA REPAIR N/A 04/13/2015   Procedure:  UMBILICAL HERNIA REPAIR;  Surgeon: [name], MD;  Location: [place];  Service: General;  Laterality: N/A;        Home Medications    Prior to Admission medications   Medication Sig Start Date End Date Taking? Authorizing Provider  albuterol (PROVENTIL HFA;VENTOLIN HFA) 108 (90 BASE) MCG/ACT inhaler Inhale 2 puffs into the lungs every 6 (six) hours as needed for wheezing or shortness of breath. 01/21/12  Yes Kathie Dike, MD  amLODipine (NORVASC) 10 MG tablet Take 1 tablet (10 mg total) by mouth daily. 12/12/17 03/12/18 Yes Emokpae, Courage, MD  aspirin 81 MG EC tablet Take 1 tablet (81 mg total) by mouth daily. With food 12/12/17  Yes Emokpae, Courage, MD  losartan (COZAAR) 50 MG tablet Take 1 tablet (50 mg total) by mouth daily. For treatment of high blood pressure and for your heart. 12/12/17  Yes Emokpae, Courage, MD  rosuvastatin (CRESTOR) 20 MG tablet Take 1 tablet (20 mg total) by mouth daily. 12/12/17  Yes Emokpae, Courage, MD  EPIPEN 2-PAK 0.3 MG/0.3ML SOAJ injection Inject 0.3 mg as directed daily as needed. Allergic reaction 10/08/14   [provider]  metoprolol tartrate (LOPRESSOR) 25 MG tablet Take 1 tablet (25 mg total) by mouth 2 (two) times daily. Patient not taking: Reported on 03/12/2018 12/12/17   Roxan Hockey, MD  nitroGLYCERIN (NITROSTAT) 0.4 MG SL tablet Place 1  tablet (0.4 mg total) under the tongue every 5 (five) minutes as needed for chest pain (3 doses MAX). 09/03/17   [name], MD  senna-docusate (SENOKOT-S) 8.6-50 MG tablet Take 2 tablets by mouth at bedtime. Patient taking differently: Take 2 tablets by mouth at bedtime as needed for mild constipation.  12/12/17 12/12/18  Roxan Hockey, MD    Family History Family History  Problem Relation Age of Onset  . Diabetes Mother   . Diabetes Father   . Stroke Father   . Colon cancer Maternal Uncle   . Heart disease Brother        Died of MI at 54    Social History Social History   Tobacco Use  . Smoking status: Current Some Day Smoker    Packs/day: 1.00    Years: 47.00    Pack years: 47.00    Types: Cigarettes    Last attempt to quit: 09/26/2017    Years since quitting: 0.4  . Smokeless tobacco: Never Used  . Tobacco comment: 10/10/2017 "quit a couple  weeks ago; smoked couple cigarettes/day recently; used to smoke 1ppd"  Substance Use Topics  . Alcohol use: Yes    Alcohol/week: 3.0 standard drinks    Types: 1 Glasses of wine, 2 Cans of beer per week    Comment: occ  . Drug use: Not Currently     Allergies   Bee venom   Review of Systems Review of Systems  All other systems reviewed and are negative.    Physical Exam Updated Vital Signs BP (!) 163/84   Pulse (!) 59   Temp 98.3 F (36.8 C) (Oral)   Resp (!) 22   Ht 1.829 m (6')   Wt 83.9 kg   SpO2 97%   BMI 25.09 kg/m   Physical Exam Vitals signs and nursing note reviewed.  Constitutional:      General: He is not in acute distress.    Appearance: He is well-developed.  HENT:     Head: Normocephalic and atraumatic.     Right Ear: External ear normal.     Left Ear: External ear normal.  Eyes:     General: No scleral icterus.       Right eye: No discharge.        Left eye: No discharge.     Conjunctiva/sclera: Conjunctivae normal.  Neck:     Musculoskeletal: Neck supple.     Trachea: No tracheal  deviation.  Cardiovascular:     Rate and Rhythm: Normal rate and regular rhythm.  Pulmonary:     Effort: Pulmonary effort is normal. No respiratory distress.     Breath sounds: Normal breath sounds. No stridor. No wheezing or rales.  Abdominal:     General: Bowel sounds are normal. There is no distension.     Palpations: Abdomen is soft.     Tenderness: There is no abdominal tenderness. There is no guarding or rebound.  Musculoskeletal: Normal range of motion.        General: No tenderness.     Right lower leg: He exhibits no tenderness. No edema.     Left lower leg: He exhibits no tenderness. No edema.     Comments: Extremities are warm and well-perfused, normal cap refill,  Skin:    General: Skin is warm and dry.     Findings: No rash.  Neurological:     Mental Status: He is alert.     Cranial Nerves: No cranial nerve deficit (no facial droop, extraocular movements intact, no slurred speech).     Sensory: No sensory deficit.     Motor: No abnormal muscle tone or seizure activity.     Coordination: Coordination normal.      ED Treatments / Results  Labs (all labs ordered are listed, but only abnormal results are displayed) Labs Reviewed  BASIC METABOLIC PANEL - Abnormal; Notable for the following components:      Result Value   Potassium 3.2 (*)    CO2 21 (*)    Glucose, Bld 220 (*)    All other components within normal limits  CBC - Abnormal; Notable for the following components:   HCT 36.9 (*)    All other components within normal limits  TROPONIN I - Abnormal; Notable for the following components:   Troponin I 0.03 (*)    All other components within normal limits  TROPONIN I    EKG EKG Interpretation  Date/Time:  Wednesday March 12 2018 07:38:02 EST Ventricular Rate:  74 PR Interval:    QRS Duration: 87 QT  Interval:  404 QTC Calculation: 449 R Axis:   48 Text Interpretation:  Sinus rhythm ST elevation, consider inferior injury No significant change since  last tracing Confirmed by Dorie Rank [phone]) on 03/12/2018 7:43:23 AM   Radiology Dg Chest 2 View  Result Date: 03/12/2018 CLINICAL DATA:  Chest pain EXAM: CHEST - 2 VIEW COMPARISON:  12/11/2017 FINDINGS: The heart size and mediastinal contours are within normal limits. Both lungs are clear. The visualized skeletal structures are unremarkable. IMPRESSION: No active cardiopulmonary disease. Electronically Signed   By: Franchot Gallo [name]   On: 03/12/2018 09:09    Procedures Procedures (including critical care time)  Medications Ordered in ED Medications  sodium chloride flush (NS) 0.9 % injection 3 mL (3 mLs Intravenous Given 03/12/18 0755)  acetaminophen (TYLENOL) tablet 650 mg (650 mg Oral Given 03/12/18 1238)     Initial Impression / Assessment and Plan / ED Course  I have reviewed the triage vital signs and the nursing notes.  Pertinent labs & imaging results that were available during my care of the patient were reviewed by me and considered in my medical decision making (see chart for details).   Patient presented to the ED for evaluation of sharp atypical chest pain.  Patient does have a history of coronary artery disease but the symptoms today were not suggestive of acute cardiac ischemia.  They were brief in nature and sharp.  He was not having any diaphoresis nausea or shortness of breath associated with the symptoms.  ED work-up is reassuring.  Serial troponins are normal.  I think the patient can safely follow-up with his primary care doctor or cardiologist.  I did encourage the patient to stop smoking again.  Also has been having some lower extremity pain.  He does have a history of peripheral vascular disease.  Symptoms are concerning for the possibility of recurrent claudication.  He has no evidence of acute vascular compromise here in the emergency room.  I stressed the importance of following up with his vascular surgeon.  Final Clinical Impressions(s) / ED Diagnoses    Final diagnoses:  Chest pain, unspecified type    ED Discharge Orders    None       Dorie Rank, MD 03/12/18 1251

## 2018-03-31 ENCOUNTER — Encounter: Payer: Medicaid Other | Admitting: *Deleted

## 2018-03-31 DIAGNOSIS — Z006 Encounter for examination for normal comparison and control in clinical research program: Secondary | ICD-10-CM

## 2018-03-31 NOTE — Research (Signed)
Subject to research clinic for randomization in the [facility] 4 research study.  No cos, aes or saes to report.  Subject randomized and injection given in clinic. Next visit scheduled.

## 2018-04-18 ENCOUNTER — Other Ambulatory Visit (HOSPITAL_COMMUNITY): Payer: Self-pay

## 2018-04-18 ENCOUNTER — Encounter: Payer: Self-pay | Admitting: Family

## 2018-04-18 ENCOUNTER — Ambulatory Visit (HOSPITAL_COMMUNITY): Payer: Medicaid Other | Attending: Vascular Surgery

## 2018-04-18 ENCOUNTER — Ambulatory Visit: Payer: Self-pay | Admitting: Vascular Surgery

## 2018-06-26 ENCOUNTER — Telehealth: Payer: Self-pay | Admitting: *Deleted

## 2018-06-26 NOTE — Telephone Encounter (Signed)
Spoke with patient re: Visit 3 in the [facility] 4 research trial.  Cancelled in person appointment and conducted visit through [facility] system due to Covid 19 restrictions.  No cos, aes or saes to report.  Will recall patient when clinic reopens for face to face visits.

## 2018-07-14 ENCOUNTER — Telehealth: Payer: Self-pay | Admitting: *Deleted

## 2018-07-14 NOTE — Telephone Encounter (Signed)
A message was left, re: follow up visit. 

## 2018-08-18 ENCOUNTER — Telehealth: Payer: Self-pay | Admitting: *Deleted

## 2018-08-18 NOTE — Telephone Encounter (Signed)
Virtual Visit Pre-Appointment Phone Call  "(Name), I am calling you today to discuss your upcoming appointment. We are currently trying to limit exposure to the virus that causes COVID-19 by seeing patients at home rather than in the office."  1. "What is the BEST phone number to call the day of the visit?" - include this in appointment notes  2. "Do you have or have access to (through a family member/friend) a smartphone with video capability that we can use for your visit?" a. If yes - list this number in appt notes as "cell" (if different from BEST phone #) and list the appointment type as a VIDEO visit in appointment notes b. If no - list the appointment type as a PHONE visit in appointment notes  3. Confirm consent - "In the setting of the current Covid19 crisis, you are scheduled for a (phone or video) visit with your provider on (date) at (time).  Just as we do with many in-office visits, in order for you to participate in this visit, we must obtain consent.  If you'd like, I can send this to your mychart (if signed up) or email for you to review.  Otherwise, I can obtain your verbal consent now.  All virtual visits are billed to your insurance company just like a normal visit would be.  By agreeing to a virtual visit, we'd like you to understand that the technology does not allow for your provider to perform an examination, and thus may limit your provider's ability to fully assess your condition. If your provider identifies any concerns that need to be evaluated in person, we will make arrangements to do so.  Finally, though the technology is pretty good, we cannot assure that it will always work on either your or our end, and in the setting of a video visit, we may have to convert it to a phone-only visit.  In either situation, we cannot ensure that we have a secure connection.  Are you willing to proceed? Yes  4. Advise patient to be prepared - "Two hours prior to your appointment, go  ahead and check your blood pressure, pulse, oxygen saturation, and your weight (if you have the equipment to check those) and write them all down. When your visit starts, your provider will ask you for this information. If you have an Apple Watch or Kardia device, please plan to have heart rate information ready on the day of your appointment. Please have a pen and paper handy nearby the day of the visit as well."  5. Give patient instructions for MyChart download to smartphone OR Doximity/Doxy.me as below if video visit (depending on what platform provider is using)  6. Inform patient they will receive a phone call 15 minutes prior to their appointment time (may be from unknown caller ID) so they should be prepared to answer    TELEPHONE CALL NOTE  Joshua Hall has been deemed a candidate for a follow-up tele-health visit to limit community exposure during the Covid-19 pandemic. I spoke with the patient via phone to ensure availability of phone/video source, confirm preferred email & phone number, and discuss instructions and expectations.  I reminded Joshua Hall to be prepared with any vital sign and/or heart rhythm information that could potentially be obtained via home monitoring, at the time of his visit. I reminded Joshua Hall to expect a phone call prior to his visit.  Ricci Barker, RN 08/18/2018 8:51 AM   INSTRUCTIONS  FOR DOWNLOADING THE MYCHART APP TO SMARTPHONE  - The patient must first make sure to have activated MyChart and know their login information - If Apple, go to [employer] and type in MyChart in the search bar and download the app. If Android, ask patient to go to [employer] and type in [place] in the search bar and download the app. The app is free but as with any other app downloads, their phone may require them to verify saved payment information or Apple/Android password.  - The patient will need to then log into the app with their MyChart username  and password, and select [facility] as their healthcare provider to link the account. When it is time for your visit, go to the MyChart app, find appointments, and click Begin Video Visit. Be sure to Select Allow for your device to access the Microphone and Camera for your visit. You will then be connected, and your provider will be with you shortly.  **If they have any issues connecting, or need assistance please contact MyChart service desk [phone])**  **If using a computer, in order to ensure the best quality for their visit they will need to use either of the following Internet Browsers: [employer], or Google Chrome**  IF USING DOXIMITY or DOXY.ME - The patient will receive a link just prior to their visit by text.     FULL LENGTH CONSENT FOR TELE-HEALTH VISIT   I hereby voluntarily request, consent and authorize [facility] and its employed or contracted physicians, physician assistants, nurse practitioners or other licensed health care professionals (the Practitioner), to provide me with telemedicine health care services (the "Services") as deemed necessary by the treating Practitioner. I acknowledge and consent to receive the Services by the Practitioner via telemedicine. I understand that the telemedicine visit will involve communicating with the Practitioner through live audiovisual communication technology and the disclosure of certain medical information by electronic transmission. I acknowledge that I have been given the opportunity to request an in-person assessment or other available alternative prior to the telemedicine visit and am voluntarily participating in the telemedicine visit.  I understand that I have the right to withhold or withdraw my consent to the use of telemedicine in the course of my care at any time, without affecting my right to future care or treatment, and that the Practitioner or I may terminate the telemedicine visit at any time. I  understand that I have the right to inspect all information obtained and/or recorded in the course of the telemedicine visit and may receive copies of available information for a reasonable fee.  I understand that some of the potential risks of receiving the Services via telemedicine include:  [name] Delay or interruption in medical evaluation due to technological equipment failure or disruption; . Information transmitted may not be sufficient (e.g. poor resolution of images) to allow for appropriate medical decision making by the Practitioner; and/or  . In rare instances, security protocols could fail, causing a breach of personal health information.  Furthermore, I acknowledge that it is my responsibility to provide information about my medical history, conditions and care that is complete and accurate to the best of my ability. I acknowledge that Practitioner's advice, recommendations, and/or decision may be based on factors not within their control, such as incomplete or inaccurate data provided by me or distortions of diagnostic images or specimens that may result from electronic transmissions. I understand that the practice of medicine is not an exact science and  that Practitioner makes no warranties or guarantees regarding treatment outcomes. I acknowledge that I will receive a copy of this consent concurrently upon execution via email to the email address I last provided but may also request a printed copy by calling the office of [facility].    I understand that my insurance will be billed for this visit.   I have read or had this consent read to me. . I understand the contents of this consent, which adequately explains the benefits and risks of the Services being provided via telemedicine.  . I have been provided ample opportunity to ask questions regarding this consent and the Services and have had my questions answered to my satisfaction. . I give my informed consent for the services to be  provided through the use of telemedicine in my medical care  By participating in this telemedicine visit I agree to the above.

## 2018-08-19 ENCOUNTER — Telehealth: Payer: Self-pay | Admitting: Cardiovascular Disease

## 2018-08-19 ENCOUNTER — Telehealth (INDEPENDENT_AMBULATORY_CARE_PROVIDER_SITE_OTHER): Payer: Medicaid Other | Admitting: Cardiovascular Disease

## 2018-08-19 VITALS — BP 132/120 | Wt 179.0 lb

## 2018-08-19 DIAGNOSIS — I739 Peripheral vascular disease, unspecified: Secondary | ICD-10-CM

## 2018-08-19 DIAGNOSIS — E785 Hyperlipidemia, unspecified: Secondary | ICD-10-CM

## 2018-08-19 DIAGNOSIS — I2510 Atherosclerotic heart disease of native coronary artery without angina pectoris: Secondary | ICD-10-CM

## 2018-08-19 NOTE — Telephone Encounter (Signed)
error 

## 2018-08-19 NOTE — Patient Instructions (Signed)
Medication Instructions:  The current medical regimen is effective;  continue present plan and medications.  If you need a refill on your cardiac medications before your next appointment, please call your pharmacy.   Lab work: Lipid, Liver when you come in for your tests this week. Attached are the lab orders that are needed before your upcoming appointment, please come in this week anytime to have your labs drawn.   They are fasting labs, so nothing to eat or drink after midnight.  Lab hours: 8:00-4:00 lunch hours 12:45-1:45  If you have labs (blood work) drawn today and your tests are completely normal, you will receive your results only by: [name] MyChart Message (if you have MyChart) OR . A paper copy in the mail If you have any lab test that is abnormal or we need to change your treatment, we will call you to review the results.  Testing/Procedures: Your physician has requested that you have an ankle brachial index (ABI). During this test an ultrasound and blood pressure cuff are used to evaluate the arteries that supply the arms and legs with blood. Allow thirty minutes for this exam. There are no restrictions or special instructions.  Your physician has requested that you have a lower extremity arterial duplex. This test is an ultrasound of the arteries in the legs. It looks at arterial blood flow in the legs. Allow one hour for Lower and Upper Arterial scans. There are no restrictions or special instructions   Follow-Up: At [facility], you and your health needs are our priority.  As part of our continuing mission to provide you with exceptional heart care, we have created designated Provider Care Teams.  These Care Teams include your primary Cardiologist (physician) and Advanced Practice Providers (APPs -  Physician Assistants and Nurse Practitioners) who all work together to provide you with the care you need, when you need it. You will need a follow up appointment in 3 months.   Please call our office 2 months in advance to schedule this appointment.  You may see Kathlyn Sacramento, MD or one of the following Advanced Practice Providers on your designated Care Team: [place], [place] . Fabian Sharp, PA-C

## 2018-08-19 NOTE — Progress Notes (Signed)
Virtual Visit via Telephone Note   This visit type was conducted due to national recommendations for restrictions regarding the COVID-19 Pandemic (e.g. social distancing) in an effort to limit this patient's exposure and mitigate transmission in our community.  Due to his co-morbid illnesses, this patient is at least at moderate risk for complications without adequate follow up.  This format is felt to be most appropriate for this patient at this time.  The patient did not have access to video technology/had technical difficulties with video requiring transitioning to audio format only (telephone).  All issues noted in this document were discussed and addressed.  No physical exam could be performed with this format.  Please refer to the patient's chart for his  consent to telehealth for [facility].   Date:  08/19/2018   ID:  Joshua Hall, DOB [birth year], MRN [id number]  Patient Location: Home Provider Location: Office  PCP:  Celene Squibb, MD  Cardiologist:  Kathlyn Sacramento, MD  Electrophysiologist:  None   Evaluation Performed:  Follow-Up Visit  Chief Complaint: Left leg pain.  History of Present Illness:    Joshua Hall is a 63 y.o. male was reached via phone for a follow-up visit regarding peripheral arterial disease and coronary artery disease.  He has chronic medical conditions that include previous tobacco use, hypertension and hyperlipidemia.  He is known to have peripheral arterial disease with claudication.  He had angiography in 2015 which showed severe mid to distal left SFA stenosis with evidence of plaque rupture.  I performed successful self-expanding stent placement at that time.   He had unstable angina in March of 2019. Cardiac catheterization showed 90% distal LAD stenosis close to the apex, occluded second diagonal and no other obstructive disease.  LVEDP was normal and ejection fraction was 55 to 60%.  The distal LAD was too small to stent and I have recommended  medical therapy. He had worsening left calf claudication with evidence of SFA occlusion.  He underwent femoral to above-the-knee popliteal artery bypass in August, 2019.   He reports recurrent left calf claudication over the last 3 months.  He has no ulceration or wounds.  There is some minimal discoloration.  No discomfort on the right side.  No chest pain or shortness of breath.  He quit smoking 2 months ago.  He takes his medications regularly.   The patient does not have symptoms concerning for COVID-19 infection (fever, chills, cough, or new shortness of breath).    Past Medical History:  Diagnosis Date  . Colitis   . COPD (chronic obstructive pulmonary disease) ([place])   . Coronary atherosclerosis of native coronary artery    a. cath in 2013 showing nonobstructive disease along the LAD and RCA with 99% stenosis of D2 --> too small for intervention, medical therapy recommended b. normal NST in 10/2013 c. 04/2017 cath showing 90% distal-LAD stenosis and occluded D2 with medical management recommended  . Hyperlipidemia   . Hypertension   . Myocardial infarction [facility]) ?2013; ?2015  . PAD (peripheral artery disease) ([place])    a. s/p stent placement to the left SFA in 2015  . Tobacco use    Past Surgical History:  Procedure Laterality Date  . ABDOMINAL AORTAGRAM N/A 12/23/2013   Procedure: ABDOMINAL Maxcine Ham;  Surgeon: [name], MD;  Location: [facility] CATH LAB;  Service: Cardiovascular;  Laterality: N/A;  . ABDOMINAL AORTOGRAM N/A 09/18/2017   Procedure: ABDOMINAL AORTOGRAM;  Surgeon: [name], MD;  Location: [facility]  INVASIVE CV LAB;  Service: Cardiovascular;  Laterality: N/A;  . CARDIAC CATHETERIZATION  10/2011   [place]  . COLONOSCOPY N/A 03/16/2015   SLF: 1. No source for abdominal pain identified. 2. six colorectal polyps removed. 3. the colon was redundant 4. mild diverticulosis noted in the sigmoid colon 5. small internal hemorroids.   . ESOPHAGOGASTRODUODENOSCOPY  N/A 03/16/2015   SLF: 1. No source for abdominal pain identified. 2. mild non-erosive gastritis and duodentitis.   . FEMORAL-POPLITEAL BYPASS GRAFT Left 10/08/2017   Procedure: Left  FEMORAL-POPLITEAL ARTERY Bypass Graft;  Surgeon: Waynetta Sandy, MD;  Location: [place];  Service: Vascular;  Laterality: Left;  . HERNIA REPAIR    . INSERTION OF MESH N/A 04/13/2015   Procedure: INSERTION OF MESH;  Surgeon: [name], MD;  Location: [place];  Service: General;  Laterality: N/A;  . LEFT HEART CATH AND CORONARY ANGIOGRAPHY N/A 05/01/2017   Procedure: LEFT HEART CATH AND CORONARY ANGIOGRAPHY;  Surgeon: [name], MD;  Location: [place] CV LAB;  Service: Cardiovascular;  Laterality: N/A;  . LEFT HEART CATHETERIZATION WITH CORONARY ANGIOGRAM N/A 10/19/2011   Procedure: LEFT HEART CATHETERIZATION WITH CORONARY ANGIOGRAM;  Surgeon: [name], MD;  Location: [facility] CATH LAB;  Service: Cardiovascular;  Laterality: N/A;  . LOWER EXTREMITY ANGIOGRAPHY Left 09/18/2017   Procedure: Lower Extremity Angiography;  Surgeon: [name], MD;  Location: [place] CV LAB;  Service: Cardiovascular;  Laterality: Left;  . MOUTH SURGERY     "all my teeth removed then cosmetic OR for my dentures to fit"  . UMBILICAL HERNIA REPAIR N/A 04/13/2015   Procedure:  UMBILICAL HERNIA REPAIR;  Surgeon: [name], MD;  Location: [place];  Service: General;  Laterality: N/A;     Current Meds  Medication Sig  . albuterol (PROVENTIL HFA;VENTOLIN HFA) 108 (90 BASE) MCG/ACT inhaler Inhale 2 puffs into the lungs every 6 (six) hours as needed for wheezing or shortness of breath.  [name] amLODipine (NORVASC) 10 MG tablet Take 1 tablet (10 mg total) by mouth daily.  [name] aspirin 81 MG EC tablet Take 1 tablet (81 mg total) by mouth daily. With food  . EPIPEN 2-PAK 0.3 MG/0.3ML SOAJ injection Inject 0.3 mg as directed daily as needed. Allergic reaction  . losartan (COZAAR) 50 MG tablet  Take 1 tablet (50 mg total) by mouth daily. For treatment of high blood pressure and for your heart.  . nitroGLYCERIN (NITROSTAT) 0.4 MG SL tablet Place 1 tablet (0.4 mg total) under the tongue every 5 (five) minutes as needed for chest pain (3 doses MAX).  [name] rosuvastatin (CRESTOR) 20 MG tablet Take 1 tablet (20 mg total) by mouth daily.  [name] senna-docusate (SENOKOT-S) 8.6-50 MG tablet Take 2 tablets by mouth at bedtime. (Patient taking differently: Take 2 tablets by mouth at bedtime as needed for mild constipation. )     Allergies:   Bee venom   Social History   Tobacco Use  . Smoking status: Current Some Day Smoker    Packs/day: 1.00    Years: 47.00    Pack years: 47.00    Types: Cigarettes    Last attempt to quit: 09/26/2017    Years since quitting: 0.8  . Smokeless tobacco: Never Used  . Tobacco comment: 10/10/2017 "quit a couple weeks ago; smoked couple cigarettes/day recently; used to smoke 1ppd"  Substance Use Topics  . Alcohol use: Yes    Alcohol/week: 3.0 standard drinks  Types: 1 Glasses of wine, 2 Cans of beer per week    Comment: occ  . Drug use: Not Currently     Family Hx: The patient's family history includes Colon cancer in his maternal uncle; Diabetes in his father and mother; Heart disease in his brother; Stroke in his father.  ROS:   Please see the history of present illness.     All other systems reviewed and are negative.   Prior CV studies:   The following studies were reviewed today:  Reviewed most recent ABI in August which was mildly reduced on the left side  Labs/Other Tests and Data Reviewed:    EKG:  No ECG reviewed.  Recent Labs: 12/11/2017: ALT 19 03/12/2018: BUN 15; Creatinine, Ser 1.14; Hemoglobin 13.1; Platelets 257; Potassium 3.2; Sodium 136   Recent Lipid Panel Lab Results  Component Value Date/Time   CHOL 170 08/07/2017 08:36 AM   TRIG 94 08/07/2017 08:36 AM   HDL 54 08/07/2017 08:36 AM   CHOLHDL 3.1 08/07/2017 08:36 AM    LDLCALC 97 08/07/2017 08:36 AM    Wt Readings from Last 3 Encounters:  08/19/18 179 lb (81.2 kg)  03/12/18 185 lb (83.9 kg)  12/11/17 180 lb (81.6 kg)     Objective:    Vital Signs:  BP (!) 132/120   Wt 179 lb (81.2 kg)   BMI 24.28 kg/m    VITAL SIGNS:  reviewed  ASSESSMENT & PLAN:    1.  Coronary artery disease involving native coronary arteries with other forms of angina: Distal LAD stenosis being treated medically.  Currently with no chest pain.  Continue medical therapy  2.  Peripheral arterial disease: Status post left femoral-popliteal bypass in August by Dr. Donzetta Matters.    He reports recurrent left calf claudication over the last few months which has been getting worse.  I am concerned about possible graft occlusion.   We will schedule him for urgent ABI and lower extremity arterial duplex. If his graft is patent, I might consider small dose Xarelto.  3.  Previous tobacco use: He quit 2 months ago.  4.  Essential hypertension: Systolic blood pressure is controlled but the diastolic reading does not seem to be accurate.  5.   Hyperlipidemia: Continue treatment with rosuvastatin.  I requested lipid and liver profile.  If LDL remains above 70, the plan is to increase rosuvastatin and add Zetia.   COVID-19 Education: The signs and symptoms of COVID-19 were discussed with the patient and how to seek care for testing (follow up with PCP or arrange E-visit).  The importance of social distancing was discussed today.  Time:   Today, I have spent 10 minutes with the patient with telehealth technology discussing the above problems.     Medication Adjustments/Labs and Tests Ordered: Current medicines are reviewed at length with the patient today.  Concerns regarding medicines are outlined above.   Tests Ordered: No orders of the defined types were placed in this encounter.   Medication Changes: No orders of the defined types were placed in this encounter.   Follow Up:  In  Person in 3 month(s)  Signed, Kathlyn Sacramento, MD  08/19/2018 8:34 AM    [place]

## 2018-08-19 NOTE — Telephone Encounter (Signed)
LVM for patient to call and schedule lower extremity doppler with ABI.

## 2018-08-22 ENCOUNTER — Other Ambulatory Visit: Payer: Self-pay

## 2018-08-22 ENCOUNTER — Ambulatory Visit (HOSPITAL_COMMUNITY)
Admission: RE | Admit: 2018-08-22 | Discharge: 2018-08-22 | Disposition: A | Discharge status: Home / Self Care | Payer: Medicaid Other | Source: Ambulatory Visit | Attending: Internal Medicine | Admitting: Internal Medicine

## 2018-08-22 DIAGNOSIS — I739 Peripheral vascular disease, unspecified: Secondary | ICD-10-CM | POA: Diagnosis not present

## 2018-08-29 ENCOUNTER — Telehealth: Payer: Self-pay | Admitting: *Deleted

## 2018-08-29 NOTE — Telephone Encounter (Signed)
Left a message for the patient to call back.  

## 2018-08-29 NOTE — Telephone Encounter (Signed)
-----   Message from [name], MD sent at 08/29/2018 10:13 AM EDT ----- Moderately reduced ABI on the left side, patent bypass graft with significant stenosis in the distal segment.  Schedule an office follow-up visit in the next few weeks to discuss this.  He might require repeat angiogram.

## 2018-09-02 NOTE — Telephone Encounter (Signed)
Left a message with the patient's friend to call the office back. She stated that they have been unable to locate the patient in the past several days. If they get in touch with him then they will have him call.

## 2018-09-02 NOTE — Telephone Encounter (Signed)
Left a message for the patient to call back.  

## 2018-09-15 NOTE — Telephone Encounter (Signed)
Fourth message has been left. Encounter will be closed and letter with results mailed to the patient.

## 2018-09-18 ENCOUNTER — Encounter: Payer: Self-pay | Admitting: *Deleted

## 2018-10-15 ENCOUNTER — Emergency Department (HOSPITAL_COMMUNITY)
Admission: EM | Admit: 2018-10-15 | Discharge: 2018-10-15 | Disposition: A | Discharge status: Home / Self Care | Payer: Medicaid Other | Attending: Emergency Medicine | Admitting: Emergency Medicine

## 2018-10-15 ENCOUNTER — Other Ambulatory Visit: Payer: Self-pay

## 2018-10-15 ENCOUNTER — Emergency Department (HOSPITAL_COMMUNITY): Payer: Medicaid Other

## 2018-10-15 ENCOUNTER — Encounter (HOSPITAL_COMMUNITY): Payer: Self-pay | Admitting: *Deleted

## 2018-10-15 DIAGNOSIS — Z7982 Long term (current) use of aspirin: Secondary | ICD-10-CM | POA: Diagnosis not present

## 2018-10-15 DIAGNOSIS — J449 Chronic obstructive pulmonary disease, unspecified: Secondary | ICD-10-CM | POA: Insufficient documentation

## 2018-10-15 DIAGNOSIS — I10 Essential (primary) hypertension: Secondary | ICD-10-CM | POA: Diagnosis not present

## 2018-10-15 DIAGNOSIS — R0789 Other chest pain: Secondary | ICD-10-CM | POA: Diagnosis not present

## 2018-10-15 DIAGNOSIS — R079 Chest pain, unspecified: Secondary | ICD-10-CM | POA: Diagnosis present

## 2018-10-15 DIAGNOSIS — F17210 Nicotine dependence, cigarettes, uncomplicated: Secondary | ICD-10-CM | POA: Insufficient documentation

## 2018-10-15 LAB — CBC WITH DIFFERENTIAL/PLATELET
Abs Immature Granulocytes: 0.01 10*3/uL (ref 0.00–0.07)
Basophils Absolute: 0 10*3/uL (ref 0.0–0.1)
Basophils Relative: 1 %
Eosinophils Absolute: 0.2 10*3/uL (ref 0.0–0.5)
Eosinophils Relative: 3 %
HCT: 40.6 % (ref 39.0–52.0)
Hemoglobin: 14.6 g/dL (ref 13.0–17.0)
Immature Granulocytes: 0 %
Lymphocytes Relative: 38 %
Lymphs Abs: 2 10*3/uL (ref 0.7–4.0)
MCH: 30.5 pg (ref 26.0–34.0)
MCHC: 36 g/dL (ref 30.0–36.0)
MCV: 84.9 fL (ref 80.0–100.0)
Monocytes Absolute: 0.6 10*3/uL (ref 0.1–1.0)
Monocytes Relative: 11 %
Neutro Abs: 2.5 10*3/uL (ref 1.7–7.7)
Neutrophils Relative %: 47 %
Platelets: 281 10*3/uL (ref 150–400)
RBC: 4.78 MIL/uL (ref 4.22–5.81)
RDW: 13.7 % (ref 11.5–15.5)
WBC: 5.3 10*3/uL (ref 4.0–10.5)
nRBC: 0 % (ref 0.0–0.2)

## 2018-10-15 LAB — TROPONIN I (HIGH SENSITIVITY)
Troponin I (High Sensitivity): 33 ng/L — ABNORMAL HIGH (ref <18)
Troponin I (High Sensitivity): 30 ng/L — ABNORMAL HIGH (ref <18)

## 2018-10-15 LAB — BASIC METABOLIC PANEL
Anion gap: 7 (ref 5–15)
BUN: 12 mg/dL (ref 8–23)
CO2: 26 mmol/L (ref 22–32)
Calcium: 9 mg/dL (ref 8.9–10.3)
Chloride: 106 mmol/L (ref 98–111)
Creatinine, Ser: 1.12 mg/dL (ref 0.61–1.24)
GFR calc Af Amer: >60 mL/min (ref >60)
GFR calc non Af Amer: >60 mL/min (ref >60)
Glucose, Bld: 109 mg/dL — ABNORMAL HIGH (ref 70–99)
Potassium: 3.4 mmol/L — ABNORMAL LOW (ref 3.5–5.1)
Sodium: 139 mmol/L (ref 135–145)

## 2018-10-15 MED ORDER — PANTOPRAZOLE SODIUM 20 MG PO TBEC: 20.0000 mg | DELAYED_RELEASE_TABLET | Freq: Every day | ORAL | 0 refills | Status: DC

## 2018-10-15 MED ORDER — ASPIRIN 81 MG PO CHEW
324.0000 mg | CHEWABLE_TABLET | Freq: Once | ORAL | Status: AC
  Administered 2018-10-15: 324 mg via ORAL
  Filled 2018-10-15: qty 4

## 2018-10-15 NOTE — Discharge Instructions (Signed)
Your tests did not show any signs of heart attack Start Protonix daily, for possible acid reflux Naprosyn twice daily (over the counter) for ongoing pain Continue baby aspirin daily ER for worsening pain / difficulty breathing.

## 2018-10-15 NOTE — ED Triage Notes (Signed)
Pt c/o intermittent mid chest pain that radiates to left side of chest that woke him up suddenly this morning at 0430.

## 2018-10-15 NOTE — ED Provider Notes (Addendum)
[facility] EMERGENCY DEPARTMENT Provider Note   CSN: [id number] Arrival date & time: 10/15/18  [id number]     History   Chief Complaint Chief Complaint  Patient presents with  . Chest Pain    HPI Joshua Hall is a 63 y.o. male.     HPI  This patient is a 63 year old male, he has a known history of coronary disease, his catheterization in 2013 showed that he had distal and small vessel disease that was only amenable to medical therapy.  He has a known history of peripheral artery disease with a stent placement in his left superficial femoral artery 5 years ago, he has had a myocardial infarction.  He presents to the hospital today with a complaint of chest pain.  He reports that this started at 4:00 in the morning approximately 3-1/2 hours ago.  It is intermittent lasting 5 to 10 seconds, seems to come in spurts, nothing seems to make it better or worse and comes despite position.  He has occasional shortness of breath with it but does not feel nauseated or diaphoretic and has had no vomiting or diarrhea.  He denies any recent coughing until this morning when he had occasional coughing.  No swelling of the legs, no medications prior to arrival.  Past Medical History:  Diagnosis Date  . Colitis   . COPD (chronic obstructive pulmonary disease) ([place])   . Coronary atherosclerosis of native coronary artery    a. cath in 2013 showing nonobstructive disease along the LAD and RCA with 99% stenosis of D2 --> too small for intervention, medical therapy recommended b. normal NST in 10/2013 c. 04/2017 cath showing 90% distal-LAD stenosis and occluded D2 with medical management recommended  . Hyperlipidemia   . Hypertension   . Myocardial infarction [facility]) ?2013; ?2015  . PAD (peripheral artery disease) ([place])    a. s/p stent placement to the left SFA in 2015  . Tobacco use     Patient Active Problem List   Diagnosis Date Noted  . Chest pain 08/07/2017  . Hypertension 08/07/2017  . COPD  (chronic obstructive pulmonary disease) ([place]) 05/01/2017  . Claudication of both lower extremities ([place]) 05/01/2017  . Closed fracture of shaft of right ulna with routine healing 11/10/16 01/24/2017  . Tremor of right hand 09/06/2016  . Colon polyps 08/04/2015  . Dyspepsia   . Diarrhea 02/17/2015  . Drug-induced erectile dysfunction 04/25/2014  . PAD (peripheral artery disease) ([place]) 12/15/2013  . Substernal chest pain 11/03/2013  . Unstable angina ([place]) 11/03/2013  . Tobacco use 11/03/2013  . Periumbilical pain [id number]  . Essential hypertension, benign 01/20/2012  . Coronary atherosclerosis of native coronary artery 10/26/2011  . Mixed hyperlipidemia 10/20/2011    Past Surgical History:  Procedure Laterality Date  . ABDOMINAL AORTAGRAM N/A 12/23/2013   Procedure: ABDOMINAL Maxcine Ham;  Surgeon: [name], MD;  Location: [place] CATH LAB;  Service: Cardiovascular;  Laterality: N/A;  . ABDOMINAL AORTOGRAM N/A 09/18/2017   Procedure: ABDOMINAL AORTOGRAM;  Surgeon: [name], MD;  Location: [place] CV LAB;  Service: Cardiovascular;  Laterality: N/A;  . CARDIAC CATHETERIZATION  10/2011   [place]  . COLONOSCOPY N/A 03/16/2015   SLF: 1. No source for abdominal pain identified. 2. six colorectal polyps removed. 3. the colon was redundant 4. mild diverticulosis noted in the sigmoid colon 5. small internal hemorroids.   . ESOPHAGOGASTRODUODENOSCOPY N/A 03/16/2015   SLF: 1. No source for abdominal pain identified. 2. mild non-erosive gastritis and  duodentitis.   . FEMORAL-POPLITEAL BYPASS GRAFT Left 10/08/2017   Procedure: Left  FEMORAL-POPLITEAL ARTERY Bypass Graft;  Surgeon: Waynetta Sandy, MD;  Location: [place];  Service: Vascular;  Laterality: Left;  . HERNIA REPAIR    . INSERTION OF MESH N/A 04/13/2015   Procedure: INSERTION OF MESH;  Surgeon: [name], MD;  Location: [place];  Service: General;  Laterality: N/A;  . LEFT HEART CATH AND  CORONARY ANGIOGRAPHY N/A 05/01/2017   Procedure: LEFT HEART CATH AND CORONARY ANGIOGRAPHY;  Surgeon: [name], MD;  Location: [place] CV LAB;  Service: Cardiovascular;  Laterality: N/A;  . LEFT HEART CATHETERIZATION WITH CORONARY ANGIOGRAM N/A 10/19/2011   Procedure: LEFT HEART CATHETERIZATION WITH CORONARY ANGIOGRAM;  Surgeon: [name], MD;  Location: [facility] CATH LAB;  Service: Cardiovascular;  Laterality: N/A;  . LOWER EXTREMITY ANGIOGRAPHY Left 09/18/2017   Procedure: Lower Extremity Angiography;  Surgeon: [name], MD;  Location: [place] CV LAB;  Service: Cardiovascular;  Laterality: Left;  . MOUTH SURGERY     "all my teeth removed then cosmetic OR for my dentures to fit"  . UMBILICAL HERNIA REPAIR N/A 04/13/2015   Procedure:  UMBILICAL HERNIA REPAIR;  Surgeon: [name], MD;  Location: [place];  Service: General;  Laterality: N/A;        Home Medications    Prior to Admission medications   Medication Sig Start Date End Date Taking? Authorizing Provider  albuterol (PROVENTIL HFA;VENTOLIN HFA) 108 (90 BASE) MCG/ACT inhaler Inhale 2 puffs into the lungs every 6 (six) hours as needed for wheezing or shortness of breath. 01/21/12  Yes Kathie Dike, MD  amLODipine (NORVASC) 10 MG tablet Take 1 tablet (10 mg total) by mouth daily. 12/12/17  Yes Roxan Hockey, MD  aspirin 81 MG EC tablet Take 1 tablet (81 mg total) by mouth daily. With food 12/12/17  Yes Emokpae, Courage, MD  EPIPEN 2-PAK 0.3 MG/0.3ML SOAJ injection Inject 0.3 mg as directed daily as needed. Allergic reaction 10/08/14  Yes [provider]  losartan (COZAAR) 50 MG tablet Take 1 tablet (50 mg total) by mouth daily. For treatment of high blood pressure and for your heart. 12/12/17  Yes Emokpae, Courage, MD  nitroGLYCERIN (NITROSTAT) 0.4 MG SL tablet Place 1 tablet (0.4 mg total) under the tongue every 5 (five) minutes as needed for chest pain (3 doses MAX). 09/03/17  Yes  [name], MD  rosuvastatin (CRESTOR) 20 MG tablet Take 1 tablet (20 mg total) by mouth daily. 12/12/17  Yes Emokpae, Courage, MD  pantoprazole (PROTONIX) 20 MG tablet Take 1 tablet (20 mg total) by mouth daily. 10/15/18   Noemi Chapel, MD    Family History Family History  Problem Relation Age of Onset  . Diabetes Mother   . Diabetes Father   . Stroke Father   . Colon cancer Maternal Uncle   . Heart disease Brother        Died of MI at 58    Social History Social History   Tobacco Use  . Smoking status: Current Some Day Smoker    Packs/day: 1.00    Years: 47.00    Pack years: 47.00    Types: Cigarettes    Last attempt to quit: 09/26/2017    Years since quitting: 1.0  . Smokeless tobacco: Never Used  . Tobacco comment: 3 cigarettes a day  Substance Use Topics  . Alcohol use: Yes    Alcohol/week: 3.0 standard drinks  Types: 1 Glasses of wine, 2 Cans of beer per week    Comment: occ  . Drug use: Not Currently    Comment: "not recently" per pt     Allergies   Bee venom   Review of Systems Review of Systems  All other systems reviewed and are negative.    Physical Exam Updated Vital Signs BP 134/83   Pulse (!) 56   Temp 98.2 F (36.8 C) (Oral)   Resp 18   Ht 1.829 m (6')   Wt 85.7 kg   SpO2 97%   BMI 25.63 kg/m   Physical Exam Vitals signs and nursing note reviewed.  Constitutional:      General: He is not in acute distress.    Appearance: He is well-developed.  HENT:     Head: Normocephalic and atraumatic.     Mouth/Throat:     Pharynx: No oropharyngeal exudate.  Eyes:     General: No scleral icterus.       Right eye: No discharge.        Left eye: No discharge.     Conjunctiva/sclera: Conjunctivae normal.     Pupils: Pupils are equal, round, and reactive to light.  Neck:     Musculoskeletal: Normal range of motion and neck supple.     Thyroid: No thyromegaly.     Vascular: No JVD.  Cardiovascular:     Rate and Rhythm: Normal rate  and regular rhythm.     Heart sounds: Normal heart sounds. No murmur. No friction rub. No gallop.   Pulmonary:     Effort: Pulmonary effort is normal. No respiratory distress.     Breath sounds: Normal breath sounds. No wheezing or rales.  Chest:     Chest wall: Tenderness present.  Abdominal:     General: Bowel sounds are normal. There is no distension.     Palpations: Abdomen is soft. There is no mass.     Tenderness: There is no abdominal tenderness.  Musculoskeletal: Normal range of motion.        General: No tenderness.  Lymphadenopathy:     Cervical: No cervical adenopathy.  Skin:    General: Skin is warm and dry.     Findings: No erythema or rash.  Neurological:     Mental Status: He is alert.     Coordination: Coordination normal.  Psychiatric:        Behavior: Behavior normal.      ED Treatments / Results  Labs (all labs ordered are listed, but only abnormal results are displayed) Labs Reviewed  BASIC METABOLIC PANEL - Abnormal; Notable for the following components:      Result Value   Potassium 3.4 (*)    Glucose, Bld 109 (*)    All other components within normal limits  TROPONIN I (HIGH SENSITIVITY) - Abnormal; Notable for the following components:   Troponin I (High Sensitivity) 33 (*)    All other components within normal limits  TROPONIN I (HIGH SENSITIVITY) - Abnormal; Notable for the following components:   Troponin I (High Sensitivity) 30 (*)    All other components within normal limits  CBC WITH DIFFERENTIAL/PLATELET    EKG EKG Interpretation  Date/Time:  Wednesday October 15 2018 07:15:50 EDT Ventricular Rate:  72 PR Interval:    QRS Duration: 86 QT Interval:  397 QTC Calculation: 435 R Axis:   37 Text Interpretation:  Sinus rhythm Minimal ST elevation, anterior leads since 03/12/18, no sig changes seen Confirmed by Sabra Heck,  Aaron Edelman [phone]) on 10/15/2018 7:35:28 AM   EKG Interpretation  Date/Time:  Wednesday October 15 2018 09:43:26 EDT  Ventricular Rate:  59 PR Interval:    QRS Duration: 88 QT Interval:  438 QTC Calculation: 434 R Axis:   43 Text Interpretation:  Sinus rhythm ST elevation suggests acute pericarditis no changes since prior Confirmed by Noemi Chapel [phone]) on 10/15/2018 9:50:47 AM        Radiology Dg Chest Port 1 View  Result Date: 10/15/2018 CLINICAL DATA:  Acute chest pain today. EXAM: PORTABLE CHEST 1 VIEW COMPARISON:  03/12/2018 and prior chest radiographs FINDINGS: The cardiomediastinal silhouette is unremarkable. There is no evidence of focal airspace disease, pulmonary edema, suspicious pulmonary nodule/mass, pleural effusion, or pneumothorax. No acute bony abnormalities are identified. IMPRESSION: No active disease. Electronically Signed   By: Margarette Canada [name]   On: 10/15/2018 08:10    Procedures Procedures (including critical care time)  Medications Ordered in ED Medications  aspirin chewable tablet 324 mg (324 mg Oral Given 10/15/18 1011)     Initial Impression / Assessment and Plan / ED Course  I have reviewed the triage vital signs and the nursing notes.  Pertinent labs & imaging results that were available during my care of the patient were reviewed by me and considered in my medical decision making (see chart for details).  Clinical Course as of Oct 14 1017  Wed Oct 15, 2018  [id number] The metabolic panel is unremarkable, borderline hypokalemia, normal CBC.  The troponin is elevated at 33.  Second troponin will be ordered, the patient will be given aspirin   [BM]  1016 Second troponin is lower than the first - will add in GERD meds - needs close f/u.   [BM]    Clinical Course User Index [BM] Noemi Chapel, MD      The patient's EKG is normal with no signs of ST elevation or depression.  He has mild diffuse ST elevation especially in the anterior leads but also seen somewhat in other leads.  This is unchanged from prior EKGs.  His chest does have tenderness in the midsternal area,  his vital signs are unremarkable, blood pressure is slightly elevated at 147/105, afebrile with a pulse of 70 and oxygen of 96 to 99%.  Due to his increased risk for cardiac disease will obtain a troponin, he will need a second troponin if negative, he does not have a known history of acid reflux however his pain which comes on at rest seems less likely to be classic coronary disease.  X-ray negative, repeat EKG unchanged, repeat troponin pending,  With 2- troponins and 2 unremarkable EKGs and pain that is atypical I think the patient is stable for discharge.  He is agreeable to the plan to follow-up, started on Protonix for home  Joshua Hall was evaluated in Emergency Department on 10/15/2018 for the symptoms described in the history of present illness. He was evaluated in the context of the global COVID-19 pandemic, which necessitated consideration that the patient might be at risk for infection with the SARS-CoV-2 virus that causes COVID-19. Institutional protocols and algorithms that pertain to the evaluation of patients at risk for COVID-19 are in a state of rapid change based on information released by regulatory bodies including the CDC and federal and state organizations. These policies and algorithms were followed during the patient's care in the ED.   Final Clinical Impressions(s) / ED Diagnoses   Final diagnoses:  Chest pain, mid sternal  ED Discharge Orders         Ordered    pantoprazole (PROTONIX) 20 MG tablet  Daily     10/15/18 1017           Noemi Chapel, MD 10/15/18 1019    Noemi Chapel, MD 10/15/18 1026

## 2018-10-15 NOTE — ED Notes (Signed)
ED Provider at bedside. 

## 2018-11-21 ENCOUNTER — Encounter: Payer: Medicaid Other | Admitting: *Deleted

## 2018-11-21 ENCOUNTER — Encounter: Payer: Self-pay | Admitting: *Deleted

## 2018-11-21 ENCOUNTER — Other Ambulatory Visit: Payer: Self-pay

## 2018-11-21 DIAGNOSIS — Z006 Encounter for examination for normal comparison and control in clinical research program: Secondary | ICD-10-CM

## 2018-11-21 NOTE — Research (Signed)
Patient to research clinic for visit 3 in the [facility] research study.  No aes or saes to report.  Injection given patient tolerated well.  Next clinic visit scheduled.

## 2019-01-13 ENCOUNTER — Ambulatory Visit: Payer: Medicaid Other | Admitting: Cardiovascular Disease

## 2019-01-13 NOTE — Progress Notes (Deleted)
Cardiology Office Note   Date:  01/13/2019   ID:  Shi, Bayerl [birth year], MRN [id number]  PCP:  Celene Squibb, MD  Cardiologist:   Kathlyn Sacramento, MD   No chief complaint on file.     History of Present Illness: Joshua Hall is a 63 y.o. male who presents for a follow-up visit regarding peripheral arterial disease and coronary artery disease.  He has chronic medical conditions that include previous tobacco use, hypertension and hyperlipidemia.  He is known to have peripheral arterial disease with claudication.  He had angiography in 2015 which showed severe mid to distal left SFA stenosis with evidence of plaque rupture.  I performed successful self-expanding stent placement at that time.   He had unstable angina in March of 2019. Cardiac catheterization showed 90% distal LAD stenosis close to the apex, occluded second diagonal and no other obstructive disease.  LVEDP was normal and ejection fraction was 55 to 60%.  The distal LAD was too small to stent and I have recommended medical therapy. He had worsening left calf claudication with evidence of SFA occlusion.  He underwent femoral to above-the-knee popliteal artery bypass in August, 2019.   He reports recurrent left calf claudication over the last 3 months.  He has no ulceration or wounds.  There is some minimal discoloration.  No discomfort on the right side.  No chest pain or shortness of breath.  He quit smoking 2 months ago.  He takes his medications regularly.     Past Medical History:  Diagnosis Date  . Colitis   . COPD (chronic obstructive pulmonary disease) ([place])   . Coronary atherosclerosis of native coronary artery    a. cath in 2013 showing nonobstructive disease along the LAD and RCA with 99% stenosis of D2 --> too small for intervention, medical therapy recommended b. normal NST in 10/2013 c. 04/2017 cath showing 90% distal-LAD stenosis and occluded D2 with medical management recommended  .  Hyperlipidemia   . Hypertension   . Myocardial infarction [facility]) ?2013; ?2015  . PAD (peripheral artery disease) ([place])    a. s/p stent placement to the left SFA in 2015  . Tobacco use     Past Surgical History:  Procedure Laterality Date  . ABDOMINAL AORTAGRAM N/A 12/23/2013   Procedure: ABDOMINAL Maxcine Ham;  Surgeon: [name], MD;  Location: [place] CATH LAB;  Service: Cardiovascular;  Laterality: N/A;  . ABDOMINAL AORTOGRAM N/A 09/18/2017   Procedure: ABDOMINAL AORTOGRAM;  Surgeon: [name], MD;  Location: [place] CV LAB;  Service: Cardiovascular;  Laterality: N/A;  . CARDIAC CATHETERIZATION  10/2011   [place]  . COLONOSCOPY N/A 03/16/2015   SLF: 1. No source for abdominal pain identified. 2. six colorectal polyps removed. 3. the colon was redundant 4. mild diverticulosis noted in the sigmoid colon 5. small internal hemorroids.   . ESOPHAGOGASTRODUODENOSCOPY N/A 03/16/2015   SLF: 1. No source for abdominal pain identified. 2. mild non-erosive gastritis and duodentitis.   . FEMORAL-POPLITEAL BYPASS GRAFT Left 10/08/2017   Procedure: Left  FEMORAL-POPLITEAL ARTERY Bypass Graft;  Surgeon: Waynetta Sandy, MD;  Location: [place];  Service: Vascular;  Laterality: Left;  . HERNIA REPAIR    . INSERTION OF MESH N/A 04/13/2015   Procedure: INSERTION OF MESH;  Surgeon: [name], MD;  Location: [place];  Service: General;  Laterality: N/A;  . LEFT HEART CATH AND CORONARY ANGIOGRAPHY N/A 05/01/2017   Procedure: LEFT HEART CATH  AND CORONARY ANGIOGRAPHY;  Surgeon: [name], MD;  Location: [place] CV LAB;  Service: Cardiovascular;  Laterality: N/A;  . LEFT HEART CATHETERIZATION WITH CORONARY ANGIOGRAM N/A 10/19/2011   Procedure: LEFT HEART CATHETERIZATION WITH CORONARY ANGIOGRAM;  Surgeon: [name], MD;  Location: [facility] CATH LAB;  Service: Cardiovascular;  Laterality: N/A;  . LOWER EXTREMITY ANGIOGRAPHY Left 09/18/2017   Procedure: Lower  Extremity Angiography;  Surgeon: [name], MD;  Location: [place] CV LAB;  Service: Cardiovascular;  Laterality: Left;  . MOUTH SURGERY     "all my teeth removed then cosmetic OR for my dentures to fit"  . UMBILICAL HERNIA REPAIR N/A 04/13/2015   Procedure:  UMBILICAL HERNIA REPAIR;  Surgeon: [name], MD;  Location: [place];  Service: General;  Laterality: N/A;     Current Outpatient Medications  Medication Sig Dispense Refill  . albuterol (PROVENTIL HFA;VENTOLIN HFA) 108 (90 BASE) MCG/ACT inhaler Inhale 2 puffs into the lungs every 6 (six) hours as needed for wheezing or shortness of breath. 1 Inhaler 0  . amLODipine (NORVASC) 10 MG tablet Take 1 tablet (10 mg total) by mouth daily. 30 tablet 3  . aspirin 81 MG EC tablet Take 1 tablet (81 mg total) by mouth daily. With food 30 tablet 6  . EPIPEN 2-PAK 0.3 MG/0.3ML SOAJ injection Inject 0.3 mg as directed daily as needed. Allergic reaction  1  . losartan (COZAAR) 50 MG tablet Take 1 tablet (50 mg total) by mouth daily. For treatment of high blood pressure and for your heart. 90 tablet 3  . nitroGLYCERIN (NITROSTAT) 0.4 MG SL tablet Place 1 tablet (0.4 mg total) under the tongue every 5 (five) minutes as needed for chest pain (3 doses MAX). 25 tablet 1  . pantoprazole (PROTONIX) 20 MG tablet Take 1 tablet (20 mg total) by mouth daily. 30 tablet 0  . rosuvastatin (CRESTOR) 20 MG tablet Take 1 tablet (20 mg total) by mouth daily. 90 tablet 3   No current facility-administered medications for this visit.     Allergies:   Bee venom    Social History:  The patient  reports that he has been smoking cigarettes. He has a 47.00 pack-year smoking history. He has never used smokeless tobacco. He reports current alcohol use of about 3.0 standard drinks of alcohol per week. He reports previous drug use.   Family History:  The patient's family history includes Colon cancer in his maternal uncle; Diabetes in his father  and mother; Heart disease in his brother; Stroke in his father.    ROS:  Please see the history of present illness.   Otherwise, review of systems are positive for none.   All other systems are reviewed and negative.    PHYSICAL EXAM: VS:  There were no vitals taken for this visit. , BMI There is no height or weight on file to calculate BMI. GEN: Well nourished, well developed, in no acute distress  HEENT: normal  Neck: no JVD, carotid bruits, or masses Cardiac: RRR; no murmurs, rubs, or gallops,no edema  Respiratory:  clear to auscultation bilaterally, normal work of breathing GI: soft, nontender, nondistended, + BS MS: no deformity or atrophy  Skin: warm and dry, no rash Neuro:  Strength and sensation are intact Psych: euthymic mood, full affect   EKG:  EKG is not ordered today.   Recent Labs: 10/15/2018: BUN 12; Creatinine, Ser 1.12; Hemoglobin 14.6; Platelets 281; Potassium 3.4; Sodium 139    Lipid  Panel    Component Value Date/Time   CHOL 170 08/07/2017 0836   TRIG 94 08/07/2017 0836   HDL 54 08/07/2017 0836   CHOLHDL 3.1 08/07/2017 0836   VLDL 19 08/07/2017 0836   LDLCALC 97 08/07/2017 0836      Wt Readings from Last 3 Encounters:  10/15/18 189 lb (85.7 kg)  08/19/18 179 lb (81.2 kg)  03/12/18 185 lb (83.9 kg)      No flowsheet data found.    ASSESSMENT AND PLAN:  1.  Coronary artery disease involving native coronary arteries with other forms of angina: Distal LAD stenosis being treated medically.  Currently with no chest pain.  Continue medical therapy  2.  Peripheral arterial disease: Status post left femoral-popliteal bypass in August by Dr. Donzetta Matters.    He reports recurrent left calf claudication over the last few months which has been getting worse.  I am concerned about possible graft occlusion.   We will schedule him for urgent ABI and lower extremity arterial duplex. If his graft is patent, I might consider small dose Xarelto.  3.  Previous tobacco  use: He quit 2 months ago.  4.  Essential hypertension: Systolic blood pressure is controlled but the diastolic reading does not seem to be accurate.  5.   Hyperlipidemia: Continue treatment with rosuvastatin.  I requested lipid and liver profile.  If LDL remains above 70, the plan is to increase rosuvastatin and add Zetia.   Disposition:   FU with with me In 6 months  Signed,  Kathlyn Sacramento, MD  01/13/2019 9:41 AM    [place]

## 2019-02-24 ENCOUNTER — Telehealth: Payer: Self-pay

## 2019-02-24 ENCOUNTER — Telehealth (INDEPENDENT_AMBULATORY_CARE_PROVIDER_SITE_OTHER): Payer: Medicaid Other | Admitting: Cardiology

## 2019-02-24 ENCOUNTER — Encounter: Payer: Self-pay | Admitting: Cardiology

## 2019-02-24 VITALS — Ht 72.0 in | Wt 205.0 lb

## 2019-02-24 DIAGNOSIS — I2510 Atherosclerotic heart disease of native coronary artery without angina pectoris: Secondary | ICD-10-CM

## 2019-02-24 DIAGNOSIS — Z720 Tobacco use: Secondary | ICD-10-CM

## 2019-02-24 DIAGNOSIS — I739 Peripheral vascular disease, unspecified: Secondary | ICD-10-CM

## 2019-02-24 DIAGNOSIS — E782 Mixed hyperlipidemia: Secondary | ICD-10-CM

## 2019-02-24 DIAGNOSIS — I10 Essential (primary) hypertension: Secondary | ICD-10-CM

## 2019-02-24 NOTE — Telephone Encounter (Signed)
Contacted patient to discuss AVS Instructions. Gave patient Joshua Hall's recommendations from today's virtual office visit. Follow up appointment has been scheduled with Dr Fletcher Anon for 03/17/2019. Patient voiced understanding and AVS mailed.

## 2019-02-24 NOTE — Progress Notes (Signed)
Virtual Visit via Telephone Note   This visit type was conducted due to national recommendations for restrictions regarding the COVID-19 Pandemic (e.g. social distancing) in an effort to limit this patient's exposure and mitigate transmission in our community.  Due to his co-morbid illnesses, this patient is at least at moderate risk for complications without adequate follow up.  This format is felt to be most appropriate for this patient at this time.  The patient did not have access to video technology/had technical difficulties with video requiring transitioning to audio format only (telephone).  All issues noted in this document were discussed and addressed.  No physical exam could be performed with this format.  Please refer to the patient's chart for his  consent to telehealth for [facility].   Date:  02/24/2019   ID:  Joshua Hall, DOB [birth year], MRN [id number]  Patient Location: Home Provider Location: Home  PCP:  Joshua Squibb, MD  Cardiologist:  Joshua Sacramento, MD  Electrophysiologist:  None   Evaluation Performed:  Follow-Up Visit  Chief Complaint:  Calf pain  History of Present Illness:    [name] is a 64 y.o. male from [place] with a history of coronary disease.  Catheterization in 2013 revealed a second diagonal stenosis that was small, he was treated medically.  He had a negative stress test in 2015 and a low risk Myoview in 2018.  He has had several admissions for chest pain.  He did undergo another catheterization in March 2019 that showed a 90% distal LAD and an occluded second diagonal.  The plan is for medical therapy.  He was last in the emergency room for chest pain in September 2020, he was discharged from the emergency room on continued medical therapy.  Patient also has vascular disease.  He had a left SFA PCI in November 2015.  He had recurrent claudication and ultimately underwent left common femoral artery femoropopliteal bypass grafting  10/08/2017 by Dr. Donzetta Hall.  He last saw Dr. Fletcher Hall in the office in July 2020.  He complained of some left calf pain.  Doppler suggested a moderately reduced ABI on the left with a patent bypass graft with significant stenosis in the distal segment.  The plan was to follow-up with Dr. Fletcher Hall to consider angiogram.  Apparently the patient was never able to be contacted.  I spoke with the patient today by phone.  He says he has pain in his left calf when he walks about 75 feet or so.  If the stops and rests for 5 minutes it gets better.  He is not had chest pain since his emergency room visit back in September and is otherwise doing well.  The patient does not have symptoms concerning for COVID-19 infection (fever, chills, cough, or new shortness of breath).    Past Medical History:  Diagnosis Date  . Colitis   . COPD (chronic obstructive pulmonary disease) ([place])   . Coronary atherosclerosis of native coronary artery    a. cath in 2013 showing nonobstructive disease along the LAD and RCA with 99% stenosis of D2 --> too small for intervention, medical therapy recommended b. normal NST in 10/2013 c. 04/2017 cath showing 90% distal-LAD stenosis and occluded D2 with medical management recommended  . Hyperlipidemia   . Hypertension   . Myocardial infarction [facility]) ?2013; ?2015  . PAD (peripheral artery disease) ([place])    a. s/p stent placement to the left SFA in 2015  . Tobacco use  Past Surgical History:  Procedure Laterality Date  . ABDOMINAL AORTAGRAM N/A 12/23/2013   Procedure: ABDOMINAL Joshua Hall;  Surgeon: Joshua Hampshire, MD;  Location: [place] CATH LAB;  Service: Cardiovascular;  Laterality: N/A;  . ABDOMINAL AORTOGRAM N/A 09/18/2017   Procedure: ABDOMINAL AORTOGRAM;  Surgeon: Joshua Hampshire, MD;  Location: [place] CV LAB;  Service: Cardiovascular;  Laterality: N/A;  . CARDIAC CATHETERIZATION  10/2011   [place]  . COLONOSCOPY N/A 03/16/2015   SLF: 1. No source for abdominal pain  identified. 2. six colorectal polyps removed. 3. the colon was redundant 4. mild diverticulosis noted in the sigmoid colon 5. small internal hemorroids.   . ESOPHAGOGASTRODUODENOSCOPY N/A 03/16/2015   SLF: 1. No source for abdominal pain identified. 2. mild non-erosive gastritis and duodentitis.   . FEMORAL-POPLITEAL BYPASS GRAFT Left 10/08/2017   Procedure: Left  FEMORAL-POPLITEAL ARTERY Bypass Graft;  Surgeon: Joshua Sandy, MD;  Location: [place];  Service: Vascular;  Laterality: Left;  . HERNIA REPAIR    . INSERTION OF MESH N/A 04/13/2015   Procedure: INSERTION OF MESH;  Surgeon: Joshua Luna, MD;  Location: [place];  Service: General;  Laterality: N/A;  . LEFT HEART CATH AND CORONARY ANGIOGRAPHY N/A 05/01/2017   Procedure: LEFT HEART CATH AND CORONARY ANGIOGRAPHY;  Surgeon: Joshua Hampshire, MD;  Location: [place] CV LAB;  Service: Cardiovascular;  Laterality: N/A;  . LEFT HEART CATHETERIZATION WITH CORONARY ANGIOGRAM N/A 10/19/2011   Procedure: LEFT HEART CATHETERIZATION WITH CORONARY ANGIOGRAM;  Surgeon: Joshua M Martinique, MD;  Location: [facility] CATH LAB;  Service: Cardiovascular;  Laterality: N/A;  . LOWER EXTREMITY ANGIOGRAPHY Left 09/18/2017   Procedure: Lower Extremity Angiography;  Surgeon: Joshua Hampshire, MD;  Location: [place] CV LAB;  Service: Cardiovascular;  Laterality: Left;  . MOUTH SURGERY     "all my teeth removed then cosmetic OR for my dentures to fit"  . UMBILICAL HERNIA REPAIR N/A 04/13/2015   Procedure:  UMBILICAL HERNIA REPAIR;  Surgeon: Joshua Luna, MD;  Location: [place];  Service: General;  Laterality: N/A;     Current Meds  Medication Sig  . albuterol (PROVENTIL HFA;VENTOLIN HFA) 108 (90 BASE) MCG/ACT inhaler Inhale 2 puffs into the lungs every 6 (six) hours as needed for wheezing or shortness of breath.  Joshua Hall Kitchen amLODipine (NORVASC) 10 MG tablet Take 1 tablet (10 mg total) by mouth daily.  Joshua Hall Kitchen aspirin 81 MG EC tablet Take 1  tablet (81 mg total) by mouth daily. With food  . EPIPEN 2-PAK 0.3 MG/0.3ML SOAJ injection Inject 0.3 mg as directed daily as needed. Allergic reaction  . losartan (COZAAR) 50 MG tablet Take 1 tablet (50 mg total) by mouth daily. For treatment of high blood pressure and for your heart.  . nitroGLYCERIN (NITROSTAT) 0.4 MG SL tablet Place 1 tablet (0.4 mg total) under the tongue every 5 (five) minutes as needed for chest pain (3 doses MAX).  Joshua Hall Kitchen pantoprazole (PROTONIX) 20 MG tablet Take 1 tablet (20 mg total) by mouth daily.  . rosuvastatin (CRESTOR) 20 MG tablet Take 1 tablet (20 mg total) by mouth daily.     Allergies:   Bee venom   Social History   Tobacco Use  . Smoking status: Current Some Day Smoker    Packs/day: 1.00    Years: 47.00    Pack years: 47.00    Types: Cigarettes    Last attempt to quit: 09/26/2017    Years since quitting: 1.4  .  Smokeless tobacco: Never Used  . Tobacco comment: 3 cigarettes a day  Substance Use Topics  . Alcohol use: Yes    Alcohol/week: 3.0 standard drinks    Types: 1 Glasses of wine, 2 Cans of beer per week    Comment: occ  . Drug use: Not Currently    Comment: "not recently" per pt     Family Hx: The patient's family history includes Colon cancer in his maternal uncle; Diabetes in his father and mother; Heart disease in his brother; Stroke in his father.  ROS:   Please see the history of present illness.    All other systems reviewed and are negative.   Prior CV studies:   The following studies were reviewed today:  LEA dopplers July 2020  Labs/Other Tests and Data Reviewed:    EKG:  No ECG reviewed.  Recent Labs: 10/15/2018: BUN 12; Creatinine, Ser 1.12; Hemoglobin 14.6; Platelets 281; Potassium 3.4; Sodium 139   Recent Lipid Panel Lab Results  Component Value Date/Time   CHOL 170 08/07/2017 08:36 AM   TRIG 94 08/07/2017 08:36 AM   HDL 54 08/07/2017 08:36 AM   CHOLHDL 3.1 08/07/2017 08:36 AM   LDLCALC 97 08/07/2017 08:36 AM     Wt Readings from Last 3 Encounters:  02/24/19 205 lb (93 kg)  10/15/18 189 lb (85.7 kg)  08/19/18 179 lb (81.2 kg)     Objective:    Vital Signs:  Ht 6' (1.829 m)   Wt 205 lb (93 kg)   BMI 27.80 kg/m    VITAL SIGNS:  reviewed  ASSESSMENT & PLAN:    Claudication- Pt complains of Lt LE claudication  PVD- S/p LSFA PTA in Nov 2015, s/p Lt F-P BPG 10/08/2017  CAD- Last cath March 2019- distal LAD disease and occluded Dx2- medical Rx. LVF was normal by echo 2018.   HTN- No recent B/P recorded. Renal function was WNL in Sept 2020.  HLD- On Crestor 20 mg- followed by PCP  Smoker- 1 PPD-(CXR was WNL Sept 2020)  Plan:  He needs to come in to the office to see Dr Joshua Hall for an exam and to review dopplers - ? PVA vs continued medical Rx.   COVID-19 Education: The signs and symptoms of COVID-19 were discussed with the patient and how to seek care for testing (follow up with PCP or arrange E-visit).  The importance of social distancing was discussed today.  Time:   Today, I have spent 15 minutes with the patient with telehealth technology discussing the above problems.     Medication Adjustments/Labs and Tests Ordered: Current medicines are reviewed at length with the patient today.  Concerns regarding medicines are outlined above.   Tests Ordered: No orders of the defined types were placed in this encounter.   Medication Changes: No orders of the defined types were placed in this encounter.   Follow Up:  In Person with Dr Joshua Hall in 3-4 weeks  Signed, Kerin Ransom, PA-C  02/24/2019 10:59 AM    [place]

## 2019-02-24 NOTE — Patient Instructions (Signed)
Medication Instructions:  Your physician recommends that you continue on your current medications as directed. Please refer to the Current Medication list given to you today. *If you need a refill on your cardiac medications before your next appointment, please call your pharmacy*  Lab Work: NONE  If you have labs (blood work) drawn today and your tests are completely normal, you will receive your results only by: [name] MyChart Message (if you have MyChart) OR . A paper copy in the mail If you have any lab test that is abnormal or we need to change your treatment, we will call you to review the results.  Testing/Procedures: NONE   Follow-Up: At [facility], you and your health needs are our priority.  As part of our continuing mission to provide you with exceptional heart care, we have created designated Provider Care Teams.  These Care Teams include your primary Cardiologist (physician) and Advanced Practice Providers (APPs -  Physician Assistants and Nurse Practitioners) who all work together to provide you with the care you need, when you need it.  Your next appointment:   3-4 week(s)  The format for your next appointment:   In Person  Provider:   Kathlyn Sacramento, MD  Other Instructions

## 2019-03-17 ENCOUNTER — Other Ambulatory Visit: Payer: Self-pay | Admitting: *Deleted

## 2019-03-17 ENCOUNTER — Encounter: Payer: Self-pay | Admitting: Cardiovascular Disease

## 2019-03-17 ENCOUNTER — Other Ambulatory Visit: Payer: Self-pay

## 2019-03-17 ENCOUNTER — Ambulatory Visit (INDEPENDENT_AMBULATORY_CARE_PROVIDER_SITE_OTHER): Payer: Medicaid Other | Admitting: Cardiovascular Disease

## 2019-03-17 VITALS — BP 153/91 | HR 67 | Temp 96.6°F | Ht 72.0 in | Wt 207.2 lb

## 2019-03-17 DIAGNOSIS — E785 Hyperlipidemia, unspecified: Secondary | ICD-10-CM | POA: Diagnosis not present

## 2019-03-17 DIAGNOSIS — I25118 Atherosclerotic heart disease of native coronary artery with other forms of angina pectoris: Secondary | ICD-10-CM

## 2019-03-17 DIAGNOSIS — I739 Peripheral vascular disease, unspecified: Secondary | ICD-10-CM

## 2019-03-17 DIAGNOSIS — Z720 Tobacco use: Secondary | ICD-10-CM

## 2019-03-17 DIAGNOSIS — I10 Essential (primary) hypertension: Secondary | ICD-10-CM | POA: Diagnosis not present

## 2019-03-17 DIAGNOSIS — Z01818 Encounter for other preprocedural examination: Secondary | ICD-10-CM | POA: Diagnosis not present

## 2019-03-17 LAB — LIPID PANEL
Chol/HDL Ratio: 3 ratio (ref 0.0–5.0)
Cholesterol, Total: 214 mg/dL — ABNORMAL HIGH (ref 100–199)
HDL: 72 mg/dL (ref >39)
LDL Chol Calc (NIH): 121 mg/dL — ABNORMAL HIGH (ref 0–99)
Triglycerides: 120 mg/dL (ref 0–149)
VLDL Cholesterol Cal: 21 mg/dL (ref 5–40)

## 2019-03-17 LAB — CBC
Hematocrit: 41.5 % (ref 37.5–51.0)
Hemoglobin: 14.6 g/dL (ref 13.0–17.7)
MCH: 30.8 pg (ref 26.6–33.0)
MCHC: 35.2 g/dL (ref 31.5–35.7)
MCV: 88 fL (ref 79–97)
Platelets: 287 10*3/uL (ref 150–450)
RBC: 4.74 x10E6/uL (ref 4.14–5.80)
RDW: 14.2 % (ref 11.6–15.4)
WBC: 6.1 10*3/uL (ref 3.4–10.8)

## 2019-03-17 LAB — BASIC METABOLIC PANEL
BUN/Creatinine Ratio: 13 (ref 10–24)
BUN: 14 mg/dL (ref 8–27)
CO2: 23 mmol/L (ref 20–29)
Calcium: 10.1 mg/dL (ref 8.6–10.2)
Chloride: 102 mmol/L (ref 96–106)
Creatinine, Ser: 1.05 mg/dL (ref 0.76–1.27)
GFR calc Af Amer: 88 mL/min/{1.73_m2} (ref >59)
GFR calc non Af Amer: 76 mL/min/{1.73_m2} (ref >59)
Glucose: 96 mg/dL (ref 65–99)
Potassium: 4.2 mmol/L (ref 3.5–5.2)
Sodium: 140 mmol/L (ref 134–144)

## 2019-03-17 LAB — HEPATIC FUNCTION PANEL
ALT: 42 IU/L (ref 0–44)
AST: 28 IU/L (ref 0–40)
Albumin: 4.4 g/dL (ref 3.8–4.8)
Alkaline Phosphatase: 85 IU/L (ref 39–117)
Bilirubin Total: 0.2 mg/dL (ref 0.0–1.2)
Bilirubin, Direct: 0.08 mg/dL (ref 0.00–0.40)
Total Protein: 7.1 g/dL (ref 6.0–8.5)

## 2019-03-17 MED ORDER — LOSARTAN POTASSIUM 100 MG PO TABS: 100.0000 mg | ORAL_TABLET | Freq: Every day | ORAL | 5 refills | Status: DC

## 2019-03-17 MED ORDER — AMLODIPINE BESYLATE 10 MG PO TABS: 10.0000 mg | ORAL_TABLET | Freq: Every day | ORAL | 3 refills | Status: DC

## 2019-03-17 MED ORDER — CLOPIDOGREL BISULFATE 75 MG PO TABS: 75.0000 mg | ORAL_TABLET | Freq: Every day | ORAL | 5 refills | Status: DC

## 2019-03-17 MED ORDER — ROSUVASTATIN CALCIUM 20 MG PO TABS: 20.0000 mg | ORAL_TABLET | Freq: Every day | ORAL | 3 refills | Status: DC

## 2019-03-17 NOTE — Progress Notes (Signed)
Cardiology Office Note   Date:  03/17/2019   ID:  Joshua, Hall [birth year], MRN [id number]  PCP:  Joshua Squibb, MD  Cardiologist:   Joshua Sacramento, MD   No chief complaint on file.     History of Present Illness: Joshua Hall is a 64 y.o. male who presents for a follow-up visit a follow-up visit regarding peripheral arterial disease and coronary artery disease.  He has chronic medical conditions that include previous tobacco use, hypertension and hyperlipidemia.  He is known to have peripheral arterial disease with claudication.  He had angiography in 2015 which showed severe mid to distal left SFA stenosis with evidence of plaque rupture.  I performed successful self-expanding stent placement at that time.   He had unstable angina in March of 2019. Cardiac catheterization showed 90% distal LAD stenosis close to the apex, occluded second diagonal and no other obstructive disease.  LVEDP was normal and ejection fraction was 55 to 60%.  The distal LAD was too small to stent and I have recommended medical therapy. He is status post femoral to above-the-knee popliteal artery bypass in August, 2019 for severe claudication and recurrent SFA occlusion. He was seen last year for recurrent left calf claudication.  Noninvasive vascular studies showed a drop in ABI on the left side 0.77 with evidence of significant distal anastomosis stenosis in the bypass.  I recommended at that time proceeding with repeat angiography but we could not reach the patient.  He returns for follow-up today and reports continued severe left calf claudication after walking about 50 feet.  He quit smoking last year but relapsed but quit again 1 month ago.  He takes his medications regularly.   Past Medical History:  Diagnosis Date  . Colitis   . COPD (chronic obstructive pulmonary disease) ([place])   . Coronary atherosclerosis of native coronary artery    a. cath in 2013 showing nonobstructive disease along  the LAD and RCA with 99% stenosis of D2 --> too small for intervention, medical therapy recommended b. normal NST in 10/2013 c. 04/2017 cath showing 90% distal-LAD stenosis and occluded D2 with medical management recommended  . Hyperlipidemia   . Hypertension   . Myocardial infarction [facility]) ?2013; ?2015  . PAD (peripheral artery disease) ([place])    a. s/p stent placement to the left SFA in 2015  . Tobacco use     Past Surgical History:  Procedure Laterality Date  . ABDOMINAL AORTAGRAM N/A 12/23/2013   Procedure: ABDOMINAL Maxcine Ham;  Surgeon: [name], MD;  Location: [place] CATH LAB;  Service: Cardiovascular;  Laterality: N/A;  . ABDOMINAL AORTOGRAM N/A 09/18/2017   Procedure: ABDOMINAL AORTOGRAM;  Surgeon: [name], MD;  Location: [place] CV LAB;  Service: Cardiovascular;  Laterality: N/A;  . CARDIAC CATHETERIZATION  10/2011   [place]  . COLONOSCOPY N/A 03/16/2015   SLF: 1. No source for abdominal pain identified. 2. six colorectal polyps removed. 3. the colon was redundant 4. mild diverticulosis noted in the sigmoid colon 5. small internal hemorroids.   . ESOPHAGOGASTRODUODENOSCOPY N/A 03/16/2015   SLF: 1. No source for abdominal pain identified. 2. mild non-erosive gastritis and duodentitis.   . FEMORAL-POPLITEAL BYPASS GRAFT Left 10/08/2017   Procedure: Left  FEMORAL-POPLITEAL ARTERY Bypass Graft;  Surgeon: Waynetta Sandy, MD;  Location: [place];  Service: Vascular;  Laterality: Left;  . HERNIA REPAIR    . INSERTION OF MESH N/A 04/13/2015   Procedure: INSERTION OF MESH;  Surgeon: [name], MD;  Location: [place];  Service: General;  Laterality: N/A;  . LEFT HEART CATH AND CORONARY ANGIOGRAPHY N/A 05/01/2017   Procedure: LEFT HEART CATH AND CORONARY ANGIOGRAPHY;  Surgeon: [name], MD;  Location: [place] CV LAB;  Service: Cardiovascular;  Laterality: N/A;  . LEFT HEART CATHETERIZATION WITH CORONARY ANGIOGRAM N/A 10/19/2011    Procedure: LEFT HEART CATHETERIZATION WITH CORONARY ANGIOGRAM;  Surgeon: [name], MD;  Location: [facility] CATH LAB;  Service: Cardiovascular;  Laterality: N/A;  . LOWER EXTREMITY ANGIOGRAPHY Left 09/18/2017   Procedure: Lower Extremity Angiography;  Surgeon: [name], MD;  Location: [place] CV LAB;  Service: Cardiovascular;  Laterality: Left;  . MOUTH SURGERY     "all my teeth removed then cosmetic OR for my dentures to fit"  . UMBILICAL HERNIA REPAIR N/A 04/13/2015   Procedure:  UMBILICAL HERNIA REPAIR;  Surgeon: [name], MD;  Location: [place];  Service: General;  Laterality: N/A;     Current Outpatient Medications  Medication Sig Dispense Refill  . albuterol (PROVENTIL HFA;VENTOLIN HFA) 108 (90 BASE) MCG/ACT inhaler Inhale 2 puffs into the lungs every 6 (six) hours as needed for wheezing or shortness of breath. 1 Inhaler 0  . amLODipine (NORVASC) 10 MG tablet Take 1 tablet (10 mg total) by mouth daily. 30 tablet 3  . aspirin 81 MG EC tablet Take 1 tablet (81 mg total) by mouth daily. With food 30 tablet 6  . EPIPEN 2-PAK 0.3 MG/0.3ML SOAJ injection Inject 0.3 mg as directed daily as needed. Allergic reaction  1  . losartan (COZAAR) 100 MG tablet Take 1 tablet (100 mg total) by mouth daily. For treatment of high blood pressure and for your heart. 30 tablet 5  . nitroGLYCERIN (NITROSTAT) 0.4 MG SL tablet Place 1 tablet (0.4 mg total) under the tongue every 5 (five) minutes as needed for chest pain (3 doses MAX). 25 tablet 1  . pantoprazole (PROTONIX) 20 MG tablet Take 1 tablet (20 mg total) by mouth daily. 30 tablet 0  . rosuvastatin (CRESTOR) 20 MG tablet Take 1 tablet (20 mg total) by mouth daily. 90 tablet 3  . clopidogrel (PLAVIX) 75 MG tablet Take 1 tablet (75 mg total) by mouth daily. 30 tablet 5   No current facility-administered medications for this visit.    Allergies:   Bee venom    Social History:  The patient  reports that he has been  smoking cigarettes. He has a 47.00 pack-year smoking history. He has never used smokeless tobacco. He reports current alcohol use of about 3.0 standard drinks of alcohol per week. He reports previous drug use.   Family History:  The patient's family history includes Colon cancer in his maternal uncle; Diabetes in his father and mother; Heart disease in his brother; Stroke in his father.    ROS:  Please see the history of present illness.   Otherwise, review of systems are positive for none.   All other systems are reviewed and negative.    PHYSICAL EXAM: VS:  BP (!) 153/91   Pulse 67   Temp (!) 96.6 F (35.9 C)   Ht 6' (1.829 m)   Wt 207 lb 3.2 oz (94 kg)   SpO2 98%   BMI 28.10 kg/m  , BMI Body mass index is 28.1 kg/m. GEN: Well nourished, well developed, in no acute distress  HEENT: normal  Neck: no JVD, carotid bruits, or masses Cardiac: RRR; no  murmurs, rubs, or gallops,no edema  Respiratory:  clear to auscultation bilaterally, normal work of breathing GI: soft, nontender, nondistended, + BS MS: no deformity or atrophy  Skin: warm and dry, no rash Neuro:  Strength and sensation are intact Psych: euthymic mood, full affect Vascular: Femoral pulses +2 on the right side and slightly diminished on the left side.  Distal pulses are not palpable.   EKG:  EKG is not ordered today.   Recent Labs: 10/15/2018: BUN 12; Creatinine, Ser 1.12; Hemoglobin 14.6; Platelets 281; Potassium 3.4; Sodium 139    Lipid Panel    Component Value Date/Time   CHOL 170 08/07/2017 0836   TRIG 94 08/07/2017 0836   HDL 54 08/07/2017 0836   CHOLHDL 3.1 08/07/2017 0836   VLDL 19 08/07/2017 0836   LDLCALC 97 08/07/2017 0836      Wt Readings from Last 3 Encounters:  03/17/19 207 lb 3.2 oz (94 kg)  02/24/19 205 lb (93 kg)  10/15/18 189 lb (85.7 kg)      No flowsheet data found.    ASSESSMENT AND PLAN:  1.  Coronary artery disease involving native coronary arteries with other forms of  angina: Distal LAD stenosis being treated medically.  Currently with no chest pain.  Continue medical therapy  2.  Peripheral arterial disease: Status post left femoral-popliteal bypass in 2019.  Currently with recurrent severe left calf claudication with evidence of significant stenosis at the distal anastomosis of the femoral-popliteal bypass.  The bypass is at high risk for acute closure and due to this and his significant symptoms, I recommend proceeding with abdominal aortogram with lower extremity runoff and possible endovascular intervention.  I discussed the procedure in details as well as risks and benefits.  Planned access is via the right common femoral artery. He is already on aspirin and I elected to start him on Plavix 75 mg once daily.  3.  Previous tobacco use: He quit smoking again 1 month ago.  I encouraged him to stay away from all tobacco products.  4.  Essential hypertension: Blood pressure continues to be elevated.  I elected to increase losartan to 100 mg daily.  5.   Hyperlipidemia: Continue treatment with rosuvastatin.  We will check lipid and liver profile and consider adding Zetia if LDL >70   Disposition:   FU with with me In 1 month  Signed,  Joshua Sacramento, MD  03/17/2019 10:17 AM    [place]

## 2019-03-17 NOTE — H&P (View-Only) (Signed)
Cardiology Office Note   Date:  03/17/2019   ID:  Nedim, Venn 1955-02-21, MRN [id number]  PCP:  Celene Squibb, MD  Cardiologist:   Kathlyn Sacramento, MD   No chief complaint on file.     History of Present Illness: Joshua Hall is a 64 y.o. male who presents for a follow-up visit a follow-up visit regarding peripheral arterial disease and coronary artery disease.  He has chronic medical conditions that include previous tobacco use, hypertension and hyperlipidemia.  He is known to have peripheral arterial disease with claudication.  He had angiography in 2015 which showed severe mid to distal left SFA stenosis with evidence of plaque rupture.  I performed successful self-expanding stent placement at that time.   He had unstable angina in March of 2019. Cardiac catheterization showed 90% distal LAD stenosis close to the apex, occluded second diagonal and no other obstructive disease.  LVEDP was normal and ejection fraction was 55 to 60%.  The distal LAD was too small to stent and I have recommended medical therapy. He is status post femoral to above-the-knee popliteal artery bypass in August, 2019 for severe claudication and recurrent SFA occlusion. He was seen last year for recurrent left calf claudication.  Noninvasive vascular studies showed a drop in ABI on the left side 0.77 with evidence of significant distal anastomosis stenosis in the bypass.  I recommended at that time proceeding with repeat angiography but we could not reach the patient.  He returns for follow-up today and reports continued severe left calf claudication after walking about 50 feet.  He quit smoking last year but relapsed but quit again 1 month ago.  He takes his medications regularly.   Past Medical History:  Diagnosis Date  . Colitis   . COPD (chronic obstructive pulmonary disease) ([place])   . Coronary atherosclerosis of native coronary artery    a. cath in 2013 showing nonobstructive disease along  the LAD and RCA with 99% stenosis of D2 --> too small for intervention, medical therapy recommended b. normal NST in 10/2013 c. 04/2017 cath showing 90% distal-LAD stenosis and occluded D2 with medical management recommended  . Hyperlipidemia   . Hypertension   . Myocardial infarction [facility]) ?2013; ?2015  . PAD (peripheral artery disease) ([place])    a. s/p stent placement to the left SFA in 2015  . Tobacco use     Past Surgical History:  Procedure Laterality Date  . ABDOMINAL AORTAGRAM N/A 12/23/2013   Procedure: ABDOMINAL Maxcine Ham;  Surgeon: [name], MD;  Location: [place] CATH LAB;  Service: Cardiovascular;  Laterality: N/A;  . ABDOMINAL AORTOGRAM N/A 09/18/2017   Procedure: ABDOMINAL AORTOGRAM;  Surgeon: [name], MD;  Location: [place] CV LAB;  Service: Cardiovascular;  Laterality: N/A;  . CARDIAC CATHETERIZATION  10/2011   [place]  . COLONOSCOPY N/A 03/16/2015   SLF: 1. No source for abdominal pain identified. 2. six colorectal polyps removed. 3. the colon was redundant 4. mild diverticulosis noted in the sigmoid colon 5. small internal hemorroids.   . ESOPHAGOGASTRODUODENOSCOPY N/A 03/16/2015   SLF: 1. No source for abdominal pain identified. 2. mild non-erosive gastritis and duodentitis.   . FEMORAL-POPLITEAL BYPASS GRAFT Left 10/08/2017   Procedure: Left  FEMORAL-POPLITEAL ARTERY Bypass Graft;  Surgeon: Waynetta Sandy, MD;  Location: [place];  Service: Vascular;  Laterality: Left;  . HERNIA REPAIR    . INSERTION OF MESH N/A 04/13/2015   Procedure: INSERTION OF MESH;  Surgeon: [name], MD;  Location: [place];  Service: General;  Laterality: N/A;  . LEFT HEART CATH AND CORONARY ANGIOGRAPHY N/A 05/01/2017   Procedure: LEFT HEART CATH AND CORONARY ANGIOGRAPHY;  Surgeon: [name], MD;  Location: [place] CV LAB;  Service: Cardiovascular;  Laterality: N/A;  . LEFT HEART CATHETERIZATION WITH CORONARY ANGIOGRAM N/A 10/19/2011    Procedure: LEFT HEART CATHETERIZATION WITH CORONARY ANGIOGRAM;  Surgeon: [name], MD;  Location: [facility] CATH LAB;  Service: Cardiovascular;  Laterality: N/A;  . LOWER EXTREMITY ANGIOGRAPHY Left 09/18/2017   Procedure: Lower Extremity Angiography;  Surgeon: [name], MD;  Location: [place] CV LAB;  Service: Cardiovascular;  Laterality: Left;  . MOUTH SURGERY     "all my teeth removed then cosmetic OR for my dentures to fit"  . UMBILICAL HERNIA REPAIR N/A 04/13/2015   Procedure:  UMBILICAL HERNIA REPAIR;  Surgeon: [name], MD;  Location: [place];  Service: General;  Laterality: N/A;     Current Outpatient Medications  Medication Sig Dispense Refill  . albuterol (PROVENTIL HFA;VENTOLIN HFA) 108 (90 BASE) MCG/ACT inhaler Inhale 2 puffs into the lungs every 6 (six) hours as needed for wheezing or shortness of breath. 1 Inhaler 0  . amLODipine (NORVASC) 10 MG tablet Take 1 tablet (10 mg total) by mouth daily. 30 tablet 3  . aspirin 81 MG EC tablet Take 1 tablet (81 mg total) by mouth daily. With food 30 tablet 6  . EPIPEN 2-PAK 0.3 MG/0.3ML SOAJ injection Inject 0.3 mg as directed daily as needed. Allergic reaction  1  . losartan (COZAAR) 100 MG tablet Take 1 tablet (100 mg total) by mouth daily. For treatment of high blood pressure and for your heart. 30 tablet 5  . nitroGLYCERIN (NITROSTAT) 0.4 MG SL tablet Place 1 tablet (0.4 mg total) under the tongue every 5 (five) minutes as needed for chest pain (3 doses MAX). 25 tablet 1  . pantoprazole (PROTONIX) 20 MG tablet Take 1 tablet (20 mg total) by mouth daily. 30 tablet 0  . rosuvastatin (CRESTOR) 20 MG tablet Take 1 tablet (20 mg total) by mouth daily. 90 tablet 3  . clopidogrel (PLAVIX) 75 MG tablet Take 1 tablet (75 mg total) by mouth daily. 30 tablet 5   No current facility-administered medications for this visit.    Allergies:   Bee venom    Social History:  The patient  reports that he has been  smoking cigarettes. He has a 47.00 pack-year smoking history. He has never used smokeless tobacco. He reports current alcohol use of about 3.0 standard drinks of alcohol per week. He reports previous drug use.   Family History:  The patient's family history includes Colon cancer in his maternal uncle; Diabetes in his father and mother; Heart disease in his brother; Stroke in his father.    ROS:  Please see the history of present illness.   Otherwise, review of systems are positive for none.   All other systems are reviewed and negative.    PHYSICAL EXAM: VS:  BP (!) 153/91   Pulse 67   Temp (!) 96.6 F (35.9 C)   Ht 6' (1.829 m)   Wt 207 lb 3.2 oz (94 kg)   SpO2 98%   BMI 28.10 kg/m  , BMI Body mass index is 28.1 kg/m. GEN: Well nourished, well developed, in no acute distress  HEENT: normal  Neck: no JVD, carotid bruits, or masses Cardiac: RRR; no  murmurs, rubs, or gallops,no edema  Respiratory:  clear to auscultation bilaterally, normal work of breathing GI: soft, nontender, nondistended, + BS MS: no deformity or atrophy  Skin: warm and dry, no rash Neuro:  Strength and sensation are intact Psych: euthymic mood, full affect Vascular: Femoral pulses +2 on the right side and slightly diminished on the left side.  Distal pulses are not palpable.   EKG:  EKG is not ordered today.   Recent Labs: 10/15/2018: BUN 12; Creatinine, Ser 1.12; Hemoglobin 14.6; Platelets 281; Potassium 3.4; Sodium 139    Lipid Panel    Component Value Date/Time   CHOL 170 08/07/2017 0836   TRIG 94 08/07/2017 0836   HDL 54 08/07/2017 0836   CHOLHDL 3.1 08/07/2017 0836   VLDL 19 08/07/2017 0836   LDLCALC 97 08/07/2017 0836      Wt Readings from Last 3 Encounters:  03/17/19 207 lb 3.2 oz (94 kg)  02/24/19 205 lb (93 kg)  10/15/18 189 lb (85.7 kg)      No flowsheet data found.    ASSESSMENT AND PLAN:  1.  Coronary artery disease involving native coronary arteries with other forms of  angina: Distal LAD stenosis being treated medically.  Currently with no chest pain.  Continue medical therapy  2.  Peripheral arterial disease: Status post left femoral-popliteal bypass in 2019.  Currently with recurrent severe left calf claudication with evidence of significant stenosis at the distal anastomosis of the femoral-popliteal bypass.  The bypass is at high risk for acute closure and due to this and his significant symptoms, I recommend proceeding with abdominal aortogram with lower extremity runoff and possible endovascular intervention.  I discussed the procedure in details as well as risks and benefits.  Planned access is via the right common femoral artery. He is already on aspirin and I elected to start him on Plavix 75 mg once daily.  3.  Previous tobacco use: He quit smoking again 1 month ago.  I encouraged him to stay away from all tobacco products.  4.  Essential hypertension: Blood pressure continues to be elevated.  I elected to increase losartan to 100 mg daily.  5.   Hyperlipidemia: Continue treatment with rosuvastatin.  We will check lipid and liver profile and consider adding Zetia if LDL >70   Disposition:   FU with with me In 1 month  Signed,  Kathlyn Sacramento, MD  03/17/2019 10:17 AM    [place]

## 2019-03-17 NOTE — Patient Instructions (Signed)
Medication Instructions:  START Clopidogrel (Plavix) 75 mg once daily INCREASE the Losartan to 100 mg once daily  *If you need a refill on your cardiac medications before your next appointment, please call your pharmacy*  Lab Work: Your provider would like for you to have the following labs today: CBC and BMET  If you have labs (blood work) drawn today and your tests are completely normal, you will receive your results only by: [name] MyChart Message (if you have MyChart) OR . A paper copy in the mail If you have any lab test that is abnormal or we need to change your treatment, we will call you to review the results.  Testing/Procedures: Your physician has requested that you have a peripheral vascular angiogram. This exam is performed at the hospital. During this exam IV contrast is used to look at arterial blood flow. Please review the information sheet given for details.  Follow-Up: At [facility], you and your health needs are our priority.  As part of our continuing mission to provide you with exceptional heart care, we have created designated Provider Care Teams.  These Care Teams include your primary Cardiologist (physician) and Advanced Practice Providers (APPs -  Physician Assistants and Nurse Practitioners) who all work together to provide you with the care you need, when you need it.  Your next appointment:   1 month(s)  The format for your next appointment:   In Person  Provider:   Kathlyn Sacramento, MD  Other Instructions    [address] [place] [address] Dept: [phone] Loc: [place]  03/17/2019  You are scheduled for a Peripheral Angiogram on Wednesday, February 10 with Dr. Kathlyn Sacramento.  1. Please arrive at the [facility] (Main Entrance A) at [facility]: [address] [place], [place] [address] at 8:30 AM (This time is two  hours before your procedure to ensure your preparation). Free valet parking service is available.   Special note: Every effort is made to have your procedure done on time. Please understand that emergencies sometimes delay scheduled procedures.  2. Diet: Do not eat solid foods after midnight.  The patient may have clear liquids until 5am upon the day of the procedure.  3. Labs:  You will need to have the coronavirus test completed prior to your procedure. An appointment has been made at 11:50 on 03/21/19. This is a Drive Up Visit at the [employer] [address]. Someone will direct you to the appropriate testing line. Please tell them that you are there for procedure testing. Stay in your car and someone will be with you shortly. Please make sure to have all other labs completed before this test because you will need to stay quarantined until your procedure.   4. Medication instructions in preparation for your procedure: Nothing to hold   On the morning of your procedure, take your Aspirin and Plavix and any morning medicines NOT listed above.  You may use sips of water.  5. Plan for one night stay--bring personal belongings. 6. Bring a current list of your medications and current insurance cards. 7. You MUST have a responsible person to drive you home. 8. Someone MUST be with you the first 24 hours after you arrive home or your discharge will be delayed. 9. Please wear clothes that are easy to get on and off and wear slip-on shoes.  Thank you for  allowing [place] to care for you!   -- [place] [facility]

## 2019-03-21 ENCOUNTER — Other Ambulatory Visit (HOSPITAL_COMMUNITY)
Admission: RE | Admit: 2019-03-21 | Discharge: 2019-03-21 | Disposition: A | Discharge status: Home / Self Care | Payer: Medicaid Other | Source: Ambulatory Visit | Attending: Cardiovascular Disease | Admitting: Cardiovascular Disease

## 2019-03-21 DIAGNOSIS — Z01812 Encounter for preprocedural laboratory examination: Secondary | ICD-10-CM | POA: Insufficient documentation

## 2019-03-21 DIAGNOSIS — Z20822 Contact with and (suspected) exposure to covid-19: Secondary | ICD-10-CM | POA: Diagnosis not present

## 2019-03-21 LAB — SARS CORONAVIRUS 2 (TAT 6-24 HRS): SARS Coronavirus 2: NEGATIVE

## 2019-03-23 ENCOUNTER — Telehealth: Payer: Self-pay | Admitting: *Deleted

## 2019-03-23 NOTE — Telephone Encounter (Signed)
Pt contacted pre-catheterization scheduled at [facility] for: Wednesday March 25, 2019  10:30 AM Verified arrival time and place: [facility]) at: 8:30 AM   No solid food after midnight prior to cath, clear liquids until 5 AM day of procedure. Contrast allergy: no   AM meds can be  taken pre-cath with sip of water including: ASA 81 mg Plavix 75 mg  Confirmed patient has responsible adult to drive home post procedure and observe 24 hours after arriving home:   Currently, due to Covid-19 pandemic, only one person will be allowed with patient. Must be the same person for patient's entire stay and will be required to wear a mask. They will be asked to wait in the waiting room for the duration of the patient's stay.  Patients are required to wear a mask when they enter the hospital.  I attempted to contact patient to review procedure instructions, mobile number listed-mailbox full unable to leave message, home number listed-unable to complete call or leave message.

## 2019-03-24 NOTE — Telephone Encounter (Signed)
Voicemail message, no answer. 

## 2019-03-24 NOTE — Telephone Encounter (Signed)
LMTCB for patient at mobile number listed to review procedure instructions

## 2019-03-24 NOTE — Telephone Encounter (Signed)
That is fine thank you .  

## 2019-03-24 NOTE — Telephone Encounter (Signed)
Pt states pharmacy has not had Plavix prescription ready for him to pick up so he has not started yet. I called [facility], Plavix prescription will be ready after 5:30 PM today, pt instructed to go ahead and start Plavix tonight and take one in the morning with ASA 81 mg before he goes to hospital, pt verbalized understanding.

## 2019-03-24 NOTE — Telephone Encounter (Signed)

## 2019-03-25 ENCOUNTER — Ambulatory Visit (HOSPITAL_COMMUNITY)
Admission: RE | Admit: 2019-03-25 | Discharge: 2019-03-25 | Disposition: A | Discharge status: Home / Self Care | Payer: Medicaid Other | Attending: Cardiovascular Disease | Admitting: Cardiovascular Disease

## 2019-03-25 ENCOUNTER — Other Ambulatory Visit: Payer: Self-pay

## 2019-03-25 ENCOUNTER — Encounter (HOSPITAL_COMMUNITY)
Admission: RE | Disposition: A | Discharge status: Home / Self Care | Payer: Medicaid Other | Source: Home / Self Care | Attending: Cardiovascular Disease

## 2019-03-25 DIAGNOSIS — I25119 Atherosclerotic heart disease of native coronary artery with unspecified angina pectoris: Secondary | ICD-10-CM | POA: Insufficient documentation

## 2019-03-25 DIAGNOSIS — I739 Peripheral vascular disease, unspecified: Secondary | ICD-10-CM

## 2019-03-25 DIAGNOSIS — I771 Stricture of artery: Secondary | ICD-10-CM | POA: Insufficient documentation

## 2019-03-25 DIAGNOSIS — F17200 Nicotine dependence, unspecified, uncomplicated: Secondary | ICD-10-CM | POA: Insufficient documentation

## 2019-03-25 DIAGNOSIS — Z7902 Long term (current) use of antithrombotics/antiplatelets: Secondary | ICD-10-CM | POA: Diagnosis not present

## 2019-03-25 DIAGNOSIS — Z955 Presence of coronary angioplasty implant and graft: Secondary | ICD-10-CM | POA: Diagnosis not present

## 2019-03-25 DIAGNOSIS — J449 Chronic obstructive pulmonary disease, unspecified: Secondary | ICD-10-CM | POA: Diagnosis not present

## 2019-03-25 DIAGNOSIS — Z8249 Family history of ischemic heart disease and other diseases of the circulatory system: Secondary | ICD-10-CM | POA: Insufficient documentation

## 2019-03-25 DIAGNOSIS — I10 Essential (primary) hypertension: Secondary | ICD-10-CM | POA: Diagnosis not present

## 2019-03-25 DIAGNOSIS — Z79899 Other long term (current) drug therapy: Secondary | ICD-10-CM | POA: Insufficient documentation

## 2019-03-25 DIAGNOSIS — I70212 Atherosclerosis of native arteries of extremities with intermittent claudication, left leg: Secondary | ICD-10-CM

## 2019-03-25 DIAGNOSIS — I252 Old myocardial infarction: Secondary | ICD-10-CM | POA: Insufficient documentation

## 2019-03-25 DIAGNOSIS — E785 Hyperlipidemia, unspecified: Secondary | ICD-10-CM | POA: Diagnosis not present

## 2019-03-25 DIAGNOSIS — Z7982 Long term (current) use of aspirin: Secondary | ICD-10-CM | POA: Insufficient documentation

## 2019-03-25 DIAGNOSIS — Z95828 Presence of other vascular implants and grafts: Secondary | ICD-10-CM | POA: Diagnosis not present

## 2019-03-25 HISTORY — PX: ABDOMINAL AORTOGRAM W/LOWER EXTREMITY: CATH118223

## 2019-03-25 SURGERY — ABDOMINAL AORTOGRAM W/LOWER EXTREMITY
Anesthesia: LOCAL | Laterality: Left

## 2019-03-25 MED ORDER — ASPIRIN 81 MG PO CHEW
81.0000 mg | CHEWABLE_TABLET | ORAL | Status: AC
  Administered 2019-03-25: 81 mg via ORAL

## 2019-03-25 MED ORDER — ACETAMINOPHEN 325 MG PO TABS: 650.0000 mg | ORAL_TABLET | ORAL | Status: DC | PRN

## 2019-03-25 MED ORDER — SODIUM CHLORIDE 0.9 % IV SOLN: 250.0000 mL | INTRAVENOUS | Status: DC | PRN

## 2019-03-25 MED ORDER — IODIXANOL 320 MG/ML IV SOLN
INTRAVENOUS | Status: DC | PRN
  Administered 2019-03-25: 60 mL

## 2019-03-25 MED ORDER — SODIUM CHLORIDE 0.9% FLUSH: 3.0000 mL | INTRAVENOUS | Status: DC | PRN

## 2019-03-25 MED ORDER — HEPARIN (PORCINE) IN NACL 1000-0.9 UT/500ML-% IV SOLN
INTRAVENOUS | Status: AC
  Filled 2019-03-25: qty 1000

## 2019-03-25 MED ORDER — LIDOCAINE HCL (PF) 1 % IJ SOLN
INTRAMUSCULAR | Status: AC
  Filled 2019-03-25: qty 30

## 2019-03-25 MED ORDER — MIDAZOLAM HCL 2 MG/2ML IJ SOLN
INTRAMUSCULAR | Status: AC
  Filled 2019-03-25: qty 2

## 2019-03-25 MED ORDER — ONDANSETRON HCL 4 MG/2ML IJ SOLN: 4.0000 mg | Freq: Four times a day (QID) | INTRAMUSCULAR | Status: DC | PRN

## 2019-03-25 MED ORDER — LIDOCAINE HCL (PF) 1 % IJ SOLN
INTRAMUSCULAR | Status: DC | PRN
  Administered 2019-03-25: 18 mL

## 2019-03-25 MED ORDER — FENTANYL CITRATE (PF) 100 MCG/2ML IJ SOLN
INTRAMUSCULAR | Status: AC
  Filled 2019-03-25: qty 2

## 2019-03-25 MED ORDER — FENTANYL CITRATE (PF) 100 MCG/2ML IJ SOLN
INTRAMUSCULAR | Status: DC | PRN
  Administered 2019-03-25: 50 ug via INTRAVENOUS

## 2019-03-25 MED ORDER — HYDRALAZINE HCL 20 MG/ML IJ SOLN: 5.0000 mg | INTRAMUSCULAR | Status: DC | PRN

## 2019-03-25 MED ORDER — HEPARIN (PORCINE) IN NACL 1000-0.9 UT/500ML-% IV SOLN
INTRAVENOUS | Status: DC | PRN
  Administered 2019-03-25 (×2): 500 mL

## 2019-03-25 MED ORDER — SODIUM CHLORIDE 0.9 % IV SOLN: INTRAVENOUS | Status: DC

## 2019-03-25 MED ORDER — SODIUM CHLORIDE 0.9% FLUSH: 3.0000 mL | Freq: Two times a day (BID) | INTRAVENOUS | Status: DC

## 2019-03-25 MED ORDER — MIDAZOLAM HCL 2 MG/2ML IJ SOLN
INTRAMUSCULAR | Status: DC | PRN
  Administered 2019-03-25: 1 mg via INTRAVENOUS

## 2019-03-25 MED ORDER — SODIUM CHLORIDE 0.9 % WEIGHT BASED INFUSION
3.0000 mL/kg/h | INTRAVENOUS | Status: AC
  Administered 2019-03-25: 09:00:00 3 mL/kg/h via INTRAVENOUS

## 2019-03-25 MED ORDER — SODIUM CHLORIDE 0.9 % WEIGHT BASED INFUSION: 1.0000 mL/kg/h | INTRAVENOUS | Status: DC

## 2019-03-25 SURGICAL SUPPLY — 15 items
CATH ANGIO 5F PIGTAIL 65CM (CATHETERS) ×1 IMPLANT
CATH CROSS OVER TEMPO 5F (CATHETERS) ×1 IMPLANT
CATH STRAIGHT 5FR 65CM (CATHETERS) ×2 IMPLANT
CLOSURE MYNX CONTROL 5F (Vascular Products) ×1 IMPLANT
GLIDEWIRE ADV .035X180CM (WIRE) ×1 IMPLANT
KIT MICROPUNCTURE NIT STIFF (SHEATH) ×1 IMPLANT
KIT PV (KITS) ×2 IMPLANT
SHEATH PINNACLE 5F 10CM (SHEATH) ×1 IMPLANT
SHEATH PROBE COVER 6X72 (BAG) ×1 IMPLANT
STOPCOCK MORSE 400PSI 3WAY (MISCELLANEOUS) ×1 IMPLANT
SYR MEDRAD MARK 7 150ML (SYRINGE) ×2 IMPLANT
TRANSDUCER W/STOPCOCK (MISCELLANEOUS) ×2 IMPLANT
TRAY PV CATH (CUSTOM PROCEDURE TRAY) ×2 IMPLANT
TUBING CIL FLEX 10 FLL-RA (TUBING) ×2 IMPLANT
WIRE HITORQ VERSACORE ST 145CM (WIRE) ×1 IMPLANT

## 2019-03-25 NOTE — Discharge Instructions (Signed)
Angiogram, Care After This sheet gives you information about how to care for yourself after your procedure. Your health care provider may also give you more specific instructions. If you have problems or questions, contact your health care provider. What can I expect after the procedure? After the procedure, it is common to have bruising and tenderness at the catheter insertion area. Follow these instructions at home: Insertion site care  Follow instructions from your health care provider about how to take care of your insertion site. Make sure you: ? Wash your hands with soap and water before you change your bandage (dressing). If soap and water are not available, use hand sanitizer. ? Change your dressing as told by your health care provider. ? Leave stitches (sutures), skin glue, or adhesive strips in place. These skin closures may need to stay in place for 2 weeks or longer. If adhesive strip edges start to loosen and curl up, you may trim the loose edges. Do not remove adhesive strips completely unless your health care provider tells you to do that.  Do not take baths, swim, or use a hot tub until your health care provider approves.  You may shower 24-48 hours after the procedure or as told by your health care provider. ? Gently wash the site with plain soap and water. ? Pat the area dry with a clean towel. ? Do not rub the site. This may cause bleeding.  Do not apply powder or lotion to the site. Keep the site clean and dry.  Check your insertion site every day for signs of infection. Check for: ? Redness, swelling, or pain. ? Fluid or blood. ? Warmth. ? Pus or a bad smell. Activity  Rest as told by your health care provider, usually for 1-2 days.  Do not lift anything that is heavier than 10 lbs. (4.5 kg) or as told by your health care provider.  Do not drive for 24 hours if you were given a medicine to help you relax (sedative).  Do not drive or use heavy machinery while  taking prescription pain medicine. General instructions   Return to your normal activities as told by your health care provider, usually in about a week. Ask your health care provider what activities are safe for you.  If the catheter site starts bleeding, lie flat and put pressure on the site. If the bleeding does not stop, get help right away. This is a medical emergency.  Drink enough fluid to keep your urine clear or pale yellow. This helps flush the contrast dye from your body.  Take over-the-counter and prescription medicines only as told by your health care provider.  Keep all follow-up visits as told by your health care provider. This is important. Contact a health care provider if:  You have a fever or chills.  You have redness, swelling, or pain around your insertion site.  You have fluid or blood coming from your insertion site.  The insertion site feels warm to the touch.  You have pus or a bad smell coming from your insertion site.  You have bruising around the insertion site.  You notice blood collecting in the tissue around the catheter site (hematoma). The hematoma may be painful to the touch. Get help right away if:  You have severe pain at the catheter insertion area.  The catheter insertion area swells very fast.  The catheter insertion area is bleeding, and the bleeding does not stop when you hold steady pressure on the area.    The area near or just beyond the catheter insertion site becomes pale, cool, tingly, or numb. These symptoms may represent a serious problem that is an emergency. Do not wait to see if the symptoms will go away. Get medical help right away. Call your local emergency services (911 in the [place] Do not drive yourself to the hospital. Summary  After the procedure, it is common to have bruising and tenderness at the catheter insertion area.  After the procedure, it is important to rest and drink plenty of fluids.  Do not take baths,  swim, or use a hot tub until your health care provider says it is okay to do so. You may shower 24-48 hours after the procedure or as told by your health care provider.  If the catheter site starts bleeding, lie flat and put pressure on the site. If the bleeding does not stop, get help right away. This is a medical emergency. This information is not intended to replace advice given to you by your health care provider. Make sure you discuss any questions you have with your health care provider. Document Revised: 01/11/2017 Document Reviewed: 01/04/2016 Elsevier Patient Education  2020 [employer]vier Inc.  

## 2019-03-25 NOTE — Interval H&P Note (Signed)
History and Physical Interval Note:  03/25/2019 10:37 AM  Joshua Hall  has presented today for surgery, with the diagnosis of pad.  The various methods of treatment have been discussed with the patient and family. After consideration of risks, benefits and other options for treatment, the patient has consented to  Procedure(s): ABDOMINAL AORTOGRAM W/LOWER EXTREMITY (N/A) as a surgical intervention.  The patient's history has been reviewed, patient examined, no change in status, stable for surgery.  I have reviewed the patient's chart and labs.  Questions were answered to the patient's satisfaction.     Kathlyn Sacramento

## 2019-04-14 ENCOUNTER — Ambulatory Visit: Payer: Medicaid Other | Admitting: Cardiovascular Disease

## 2019-04-21 ENCOUNTER — Ambulatory Visit: Payer: Medicaid Other | Admitting: Cardiovascular Disease

## 2019-04-30 ENCOUNTER — Encounter: Payer: Self-pay | Admitting: *Deleted

## 2019-05-19 ENCOUNTER — Other Ambulatory Visit: Payer: Self-pay

## 2019-05-19 ENCOUNTER — Ambulatory Visit (INDEPENDENT_AMBULATORY_CARE_PROVIDER_SITE_OTHER): Payer: Medicare Other | Admitting: Cardiovascular Disease

## 2019-05-19 ENCOUNTER — Encounter: Payer: Self-pay | Admitting: Cardiovascular Disease

## 2019-05-19 VITALS — BP 142/84 | HR 68 | Ht 72.0 in | Wt 208.2 lb

## 2019-05-19 DIAGNOSIS — I2510 Atherosclerotic heart disease of native coronary artery without angina pectoris: Secondary | ICD-10-CM

## 2019-05-19 DIAGNOSIS — I10 Essential (primary) hypertension: Secondary | ICD-10-CM

## 2019-05-19 DIAGNOSIS — E785 Hyperlipidemia, unspecified: Secondary | ICD-10-CM | POA: Diagnosis not present

## 2019-05-19 DIAGNOSIS — I739 Peripheral vascular disease, unspecified: Secondary | ICD-10-CM

## 2019-05-19 MED ORDER — ROSUVASTATIN CALCIUM 20 MG PO TABS: 20.0000 mg | ORAL_TABLET | Freq: Every day | ORAL | 3 refills | Status: DC

## 2019-05-19 MED ORDER — CILOSTAZOL 50 MG PO TABS: 50.0000 mg | ORAL_TABLET | Freq: Two times a day (BID) | ORAL | 11 refills | Status: DC

## 2019-05-19 NOTE — Patient Instructions (Signed)
Medication Instructions:  STOP the Clopidogrel (Plavix) START Cilostazol (Pletal) 50 mg twice daily  *If you need a refill on your cardiac medications before your next appointment, please call your pharmacy*   Lab Work: None ordered If you have labs (blood work) drawn today and your tests are completely normal, you will receive your results only by: [name] MyChart Message (if you have MyChart) OR . A paper copy in the mail If you have any lab test that is abnormal or we need to change your treatment, we will call you to review the results.   Testing/Procedures: None ordered   Follow-Up: At [facility], you and your health needs are our priority.  As part of our continuing mission to provide you with exceptional heart care, we have created designated Provider Care Teams.  These Care Teams include your primary Cardiologist (physician) and Advanced Practice Providers (APPs -  Physician Assistants and Nurse Practitioners) who all work together to provide you with the care you need, when you need it.  We recommend signing up for the patient portal called "MyChart".  Sign up information is provided on this After Visit Summary.  MyChart is used to connect with patients for Virtual Visits (Telemedicine).  Patients are able to view lab/test results, encounter notes, upcoming appointments, etc.  Non-urgent messages can be sent to your provider as well.   To learn more about what you can do with MyChart, go to [email or web address].    Your next appointment:   6 month(s)  The format for your next appointment:   In Person  Provider:   Kathlyn Sacramento, MD   Other Instructions  Coping with Quitting Smoking  Quitting smoking is a physical and mental challenge. You will face cravings, withdrawal symptoms, and temptation. Before quitting, work with your health care provider to make a plan that can help you cope. Preparation can help you quit and keep you from giving in. How can I cope with  cravings? Cravings usually last for 5-10 minutes. If you get through it, the craving will pass. Consider taking the following actions to help you cope with cravings:  Keep your mouth busy: ? Chew sugar-free gum. ? Suck on hard candies or a straw. ? Brush your teeth.  Keep your hands and body busy: ? Immediately change to a different activity when you feel a craving. ? Squeeze or play with a ball. ? Do an activity or a hobby, like making bead jewelry, practicing needlepoint, or working with wood. ? Mix up your normal routine. ? Take a short exercise break. Go for a quick walk or run up and down stairs. ? Spend time in public places where smoking is not allowed.  Focus on doing something kind or helpful for someone else.  Call a friend or family member to talk during a craving.  Join a support group.  Call a quit line, such as 1-800-QUIT-NOW.  Talk with your health care provider about medicines that might help you cope with cravings and make quitting easier for you. How can I deal with withdrawal symptoms? Your body may experience negative effects as it tries to get used to not having nicotine in the system. These effects are called withdrawal symptoms. They may include:  Feeling hungrier than normal.  Trouble concentrating.  Irritability.  Trouble sleeping.  Feeling depressed.  Restlessness and agitation.  Craving a cigarette. To manage withdrawal symptoms:  Avoid places, people, and activities that trigger your cravings.  Remember why you want to quit.  Get plenty of sleep.  Avoid coffee and other caffeinated drinks. These may worsen some of your symptoms. How can I handle social situations? Social situations can be difficult when you are quitting smoking, especially in the first few weeks. To manage this, you can:  Avoid parties, bars, and other social situations where people might be smoking.  Avoid alcohol.  Leave right away if you have the urge to  smoke.  Explain to your family and friends that you are quitting smoking. Ask for understanding and support.  Plan activities with friends or family where smoking is not an option. What are some ways I can cope with stress? Wanting to smoke may cause stress, and stress can make you want to smoke. Find ways to manage your stress. Relaxation techniques can help. For example:  Breathe slowly and deeply, in through your nose and out through your mouth.  Listen to soothing, relaxing music.  Talk with a family member or friend about your stress.  Light a candle.  Soak in a bath or take a shower.  Think about a peaceful place. What are some ways I can prevent weight gain? Be aware that many people gain weight after they quit smoking. However, not everyone does. To keep from gaining weight, have a plan in place before you quit and stick to the plan after you quit. Your plan should include:  Having healthy snacks. When you have a craving, it may help to: ? Eat plain popcorn, crunchy carrots, celery, or other cut vegetables. ? Chew sugar-free gum.  Changing how you eat: ? Eat small portion sizes at meals. ? Eat 4-6 small meals throughout the day instead of 1-2 large meals a day. ? Be mindful when you eat. Do not watch television or do other things that might distract you as you eat.  Exercising regularly: ? Make time to exercise each day. If you do not have time for a long workout, do short bouts of exercise for 5-10 minutes several times a day. ? Do some form of strengthening exercise, like weight lifting, and some form of aerobic exercise, like running or swimming.  Drinking plenty of water or other low-calorie or no-calorie drinks. Drink 6-8 glasses of water daily, or as much as instructed by your health care provider. Summary  Quitting smoking is a physical and mental challenge. You will face cravings, withdrawal symptoms, and temptation to smoke again. Preparation can help you as you  go through these challenges.  You can cope with cravings by keeping your mouth busy (such as by chewing gum), keeping your body and hands busy, and making calls to family, friends, or a helpline for people who want to quit smoking.  You can cope with withdrawal symptoms by avoiding places where people smoke, avoiding drinks with caffeine, and getting plenty of rest.  Ask your health care provider about the different ways to prevent weight gain, avoid stress, and handle social situations. This information is not intended to replace advice given to you by your health care provider. Make sure you discuss any questions you have with your health care provider. Document Revised: 01/11/2017 Document Reviewed: 01/27/2016 Elsevier Patient Education  2020 [employer].

## 2019-05-19 NOTE — Progress Notes (Signed)
Cardiology Office Note   Date:  05/19/2019   ID:  Joshua Hall 22-Feb-1955, MRN [id number]  PCP:  Joshua Squibb, MD  Cardiologist:   Joshua Sacramento, MD   No chief complaint on file.     History of Present Illness: Joshua Hall is a 64 y.o. male who presents for a follow-up visit a follow-up visit regarding peripheral arterial disease and coronary artery disease.  He has chronic medical conditions that include previous tobacco use, hypertension and hyperlipidemia.  He is known to have peripheral arterial disease with claudication.  He had angiography in 2015 which showed severe mid to distal left SFA stenosis with evidence of plaque rupture.  I performed successful self-expanding stent placement at that time.   He had unstable angina in March of 2019. Cardiac catheterization showed 90% distal LAD stenosis close to the apex, occluded second diagonal and no other obstructive disease.  LVEDP was normal and ejection fraction was 55 to 60%.  The distal LAD was too small to stent and I have recommended medical therapy. He is status post femoral to above-the-knee popliteal artery bypass in August, 2019 for severe claudication and recurrent SFA occlusion.  He had recent recurrent left calf claudication and underwent angiography which showed occluded femoropopliteal bypass and chronically occluded SFA with extensive collaterals from the profunda.  No revascularization was performed. He reports stable left calf claudication which happens after walking about 200 feet.  No rest pain or lower extremity ulceration.  No chest pain or shortness of breath. He admits that he has not been taking rosuvastatin regularly.  He quit smoking for the most part although he does smoke a cigarette every now and then.   Past Medical History:  Diagnosis Date  . Colitis   . COPD (chronic obstructive pulmonary disease) ([place])   . Coronary atherosclerosis of native coronary artery    a. cath in 2013 showing  nonobstructive disease along the LAD and RCA with 99% stenosis of D2 --> too small for intervention, medical therapy recommended b. normal NST in 10/2013 c. 04/2017 cath showing 90% distal-LAD stenosis and occluded D2 with medical management recommended  . Hyperlipidemia   . Hypertension   . Myocardial infarction [facility]) ?2013; ?2015  . PAD (peripheral artery disease) ([place])    a. s/p stent placement to the left SFA in 2015  . Tobacco use     Past Surgical History:  Procedure Laterality Date  . ABDOMINAL AORTAGRAM N/A 12/23/2013   Procedure: ABDOMINAL Joshua Hall;  Surgeon: Joshua Hampshire, MD;  Location: [place] CATH LAB;  Service: Cardiovascular;  Laterality: N/A;  . ABDOMINAL AORTOGRAM N/A 09/18/2017   Procedure: ABDOMINAL AORTOGRAM;  Surgeon: Joshua Hampshire, MD;  Location: [place] CV LAB;  Service: Cardiovascular;  Laterality: N/A;  . ABDOMINAL AORTOGRAM W/LOWER EXTREMITY Left 03/25/2019   Procedure: ABDOMINAL AORTOGRAM W/LOWER EXTREMITY;  Surgeon: Joshua Hampshire, MD;  Location: [place] CV LAB;  Service: Cardiovascular;  Laterality: Left;  . CARDIAC CATHETERIZATION  10/2011   Joshua Hall  . COLONOSCOPY N/A 03/16/2015   SLF: 1. No source for abdominal pain identified. 2. six colorectal polyps removed. 3. the colon was redundant 4. mild diverticulosis noted in the sigmoid colon 5. small internal hemorroids.   . ESOPHAGOGASTRODUODENOSCOPY N/A 03/16/2015   SLF: 1. No source for abdominal pain identified. 2. mild non-erosive gastritis and duodentitis.   . FEMORAL-POPLITEAL BYPASS GRAFT Left 10/08/2017   Procedure: Left  FEMORAL-POPLITEAL ARTERY Bypass Graft;  Surgeon: Joshua Hall,  Joshua Dom, MD;  Location: [facility];  Service: Vascular;  Laterality: Left;  . HERNIA REPAIR    . INSERTION OF MESH N/A 04/13/2015   Procedure: INSERTION OF MESH;  Surgeon: Joshua Luna, MD;  Location: [place];  Service: General;  Laterality: N/A;  . LEFT HEART CATH AND CORONARY ANGIOGRAPHY  N/A 05/01/2017   Procedure: LEFT HEART CATH AND CORONARY ANGIOGRAPHY;  Surgeon: Joshua Hampshire, MD;  Location: [place] CV LAB;  Service: Cardiovascular;  Laterality: N/A;  . LEFT HEART CATHETERIZATION WITH CORONARY ANGIOGRAM N/A 10/19/2011   Procedure: LEFT HEART CATHETERIZATION WITH CORONARY ANGIOGRAM;  Surgeon: Joshua M Martinique, MD;  Location: [facility] CATH LAB;  Service: Cardiovascular;  Laterality: N/A;  . LOWER EXTREMITY ANGIOGRAPHY Left 09/18/2017   Procedure: Lower Extremity Angiography;  Surgeon: Joshua Hampshire, MD;  Location: [place] CV LAB;  Service: Cardiovascular;  Laterality: Left;  . MOUTH SURGERY     "all my teeth removed then cosmetic OR for my dentures to fit"  . UMBILICAL HERNIA REPAIR N/A 04/13/2015   Procedure:  UMBILICAL HERNIA REPAIR;  Surgeon: Joshua Luna, MD;  Location: [place];  Service: General;  Laterality: N/A;     Current Outpatient Medications  Medication Sig Dispense Refill  . albuterol (PROVENTIL HFA;VENTOLIN HFA) 108 (90 BASE) MCG/ACT inhaler Inhale 2 puffs into the lungs every 6 (six) hours as needed for wheezing or shortness of breath. 1 Inhaler 0  . amLODipine (NORVASC) 10 MG tablet Take 1 tablet (10 mg total) by mouth daily. 30 tablet 3  . aspirin 81 MG EC tablet Take 1 tablet (81 mg total) by mouth daily. With food 30 tablet 6  . EPIPEN 2-PAK 0.3 MG/0.3ML SOAJ injection Inject 0.3 mg as directed as needed for anaphylaxis. Allergic reaction  1  . losartan (COZAAR) 100 MG tablet Take 1 tablet (100 mg total) by mouth daily. For treatment of high blood pressure and for your heart. 30 tablet 5  . nitroGLYCERIN (NITROSTAT) 0.4 MG SL tablet Place 1 tablet (0.4 mg total) under the tongue every 5 (five) minutes as needed for chest pain (3 doses MAX). 25 tablet 1  . pantoprazole (PROTONIX) 20 MG tablet Take 1 tablet (20 mg total) by mouth daily. 30 tablet 0  . rosuvastatin (CRESTOR) 20 MG tablet Take 1 tablet (20 mg total) by mouth daily. 90  tablet 3  . cilostazol (PLETAL) 50 MG tablet Take 1 tablet (50 mg total) by mouth 2 (two) times daily. 60 tablet 11   No current facility-administered medications for this visit.    Allergies:   Bee venom    Social History:  The patient  reports that he has been smoking cigarettes. He has a 47.00 pack-year smoking history. He has never used smokeless tobacco. He reports current alcohol use of about 3.0 standard drinks of alcohol per week. He reports previous drug use.   Family History:  The patient's family history includes Colon cancer in his maternal uncle; Diabetes in his father and mother; Heart disease in his brother; Stroke in his father.    ROS:  Please see the history of present illness.   Otherwise, review of systems are positive for none.   All other systems are reviewed and negative.    PHYSICAL EXAM: VS:  BP (!) 142/84   Pulse 68   Ht 6' (1.829 m)   Wt 208 lb 3.2 oz (94.4 kg)   BMI 28.24 kg/m  , BMI Body mass index is  28.24 kg/m. GEN: Well nourished, well developed, in no acute distress  HEENT: normal  Neck: no JVD, carotid bruits, or masses Cardiac: RRR; no murmurs, rubs, or gallops,no edema  Respiratory:  clear to auscultation bilaterally, normal work of breathing GI: soft, nontender, nondistended, + BS MS: no deformity or atrophy  Skin: warm and dry, no rash Neuro:  Strength and sensation are intact Psych: euthymic mood, full affect  EKG:  EKG is ordered today. EKG showed normal sinus rhythm with moderate LVH.   Recent Labs: 03/17/2019: ALT 42; BUN 14; Creatinine, Ser 1.05; Hemoglobin 14.6; Platelets 287; Potassium 4.2; Sodium 140    Lipid Panel    Component Value Date/Time   CHOL 214 (H) 03/17/2019 1031   TRIG 120 03/17/2019 1031   HDL 72 03/17/2019 1031   CHOLHDL 3.0 03/17/2019 1031   CHOLHDL 3.1 08/07/2017 0836   VLDL 19 08/07/2017 0836   LDLCALC 121 (H) 03/17/2019 1031      Wt Readings from Last 3 Encounters:  05/19/19 208 lb 3.2 oz (94.4 kg)   03/17/19 207 lb 3.2 oz (94 kg)  02/24/19 205 lb (93 kg)      No flowsheet data found.    ASSESSMENT AND PLAN:  1.  Coronary artery disease involving native coronary arteries with other forms of angina: Distal LAD stenosis being treated medically.  Currently with no chest pain.  Continue medical therapy  2.  Peripheral arterial disease: Status post left femoral-popliteal bypass in 2019 which was found to be occluded on recent angiography.  He has stable moderate left calf claudication and has extensive collaterals from the profunda to his occluded SFA.  I recommend continuing medical therapy.  I elected to switch clopidogrel to cilostazol 50 mg twice daily for symptomatic improvement and encouraged him to walk on a daily basis.  3.    Intermittent tobacco use: Discussed with him the importance of complete abstinence.  4.  Essential hypertension: Blood pressure improved with increasing losartan.    5.   Hyperlipidemia: Recent lipid profile showed an LDL of 121 but he has not been out of rosuvastatin.  I discussed with him the importance of taking this medication regularly and refill the medication.   Disposition:   FU with with me In 6 months  Signed,  Joshua Sacramento, MD  05/19/2019 11:40 AM    [place]

## 2019-06-16 IMAGING — DX DG KNEE COMPLETE 4+V*L*
4 series · 4 of 4 positions shown · non-contrast
Comparison: None.

CLINICAL DATA: Left knee pain after fall yesterday.

EXAM:
LEFT KNEE - COMPLETE 4+ VIEW

[knee ap (1 of 3)]
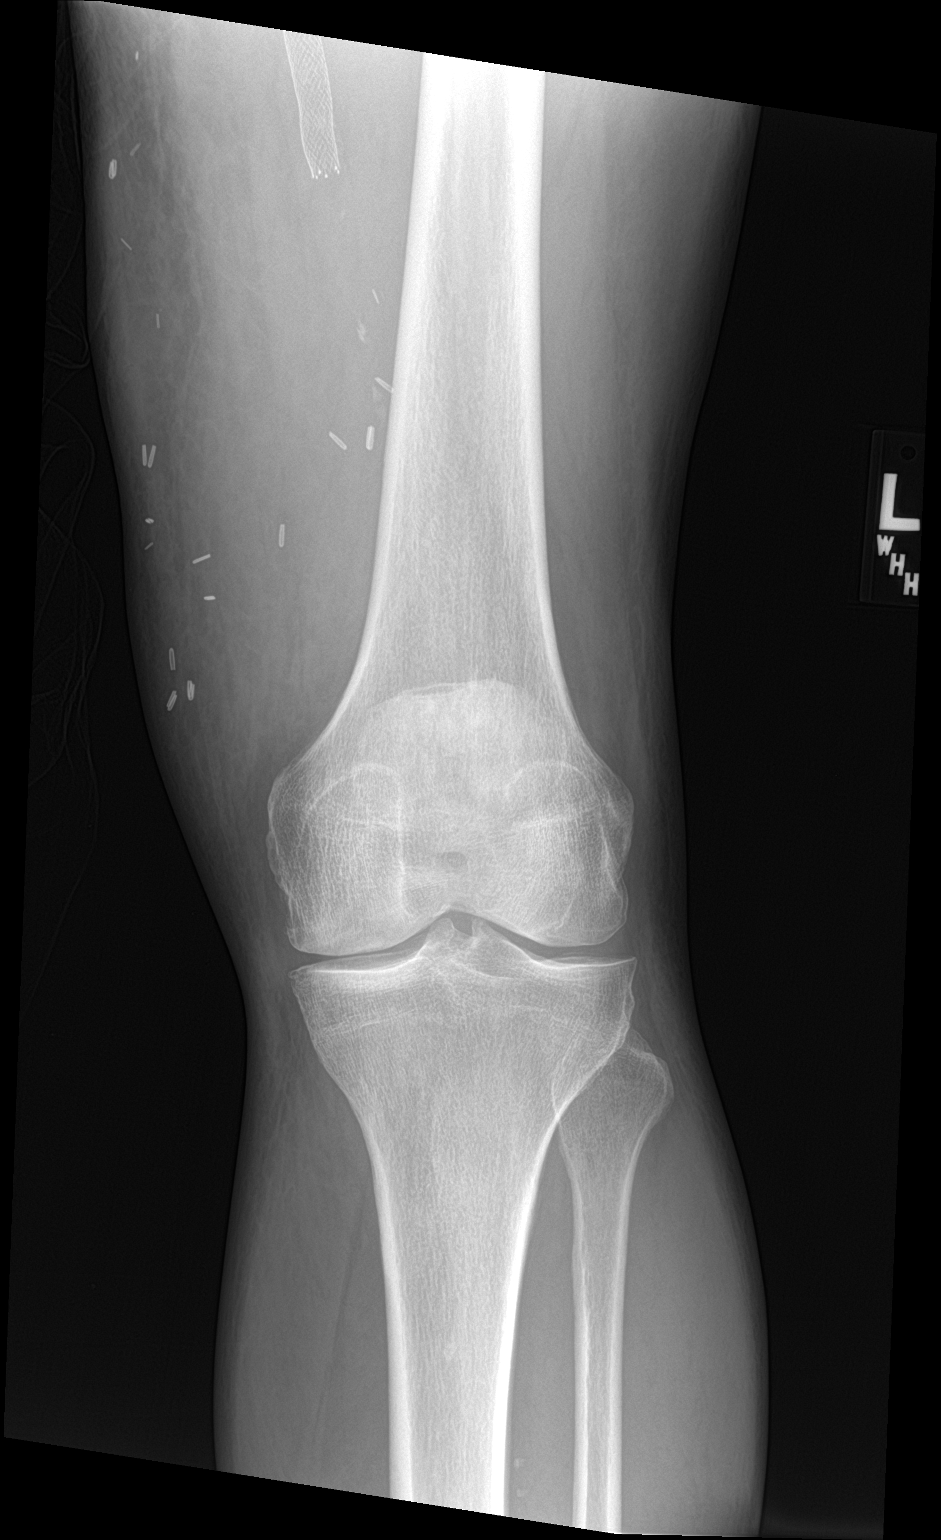

[knee lat]
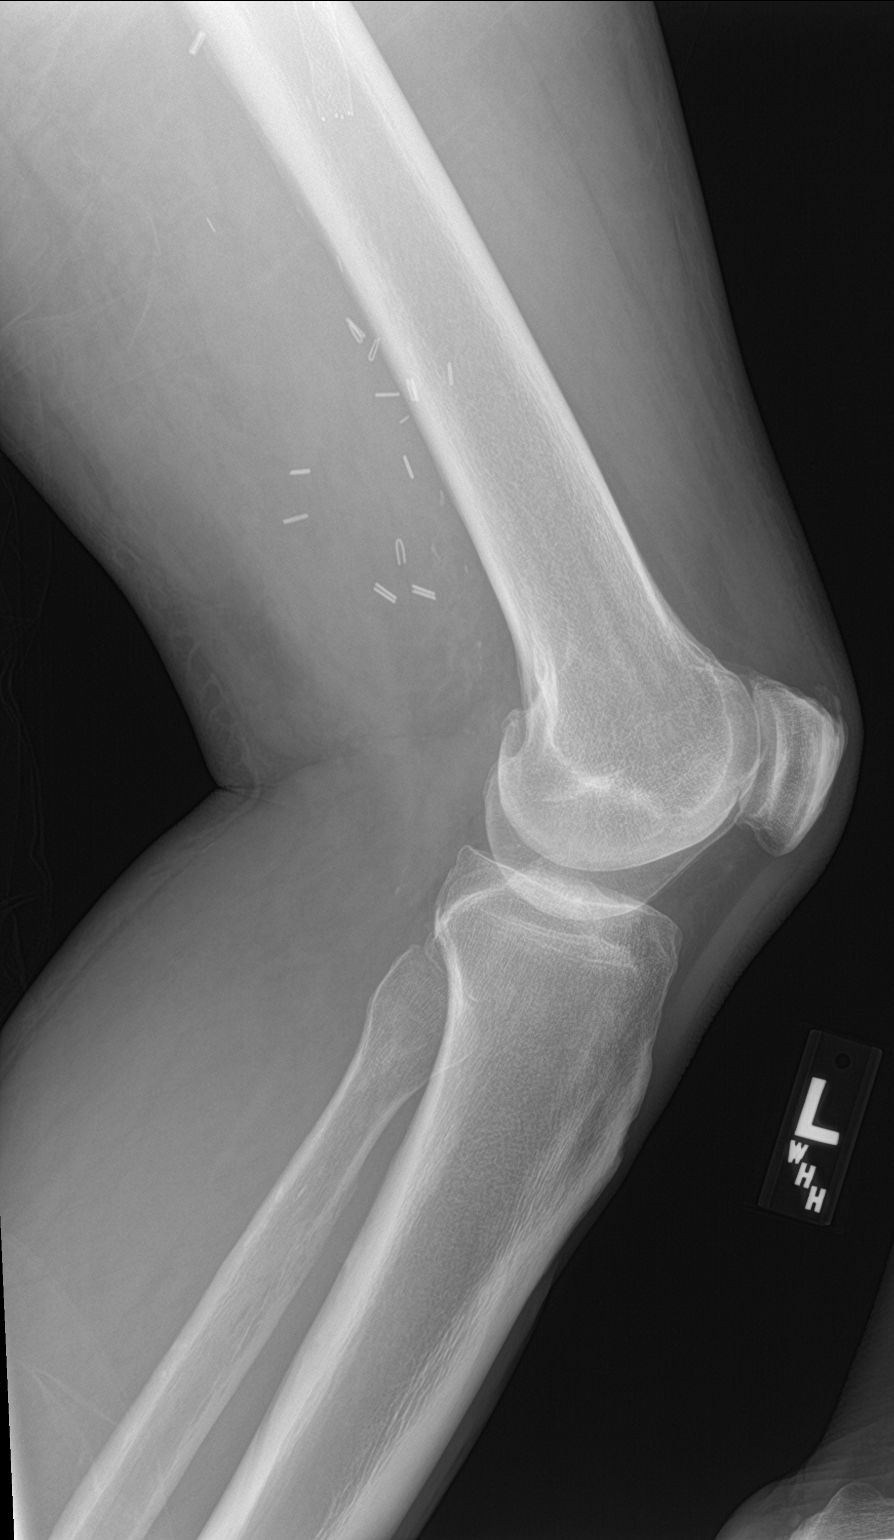

[knee ap (2 of 3)]
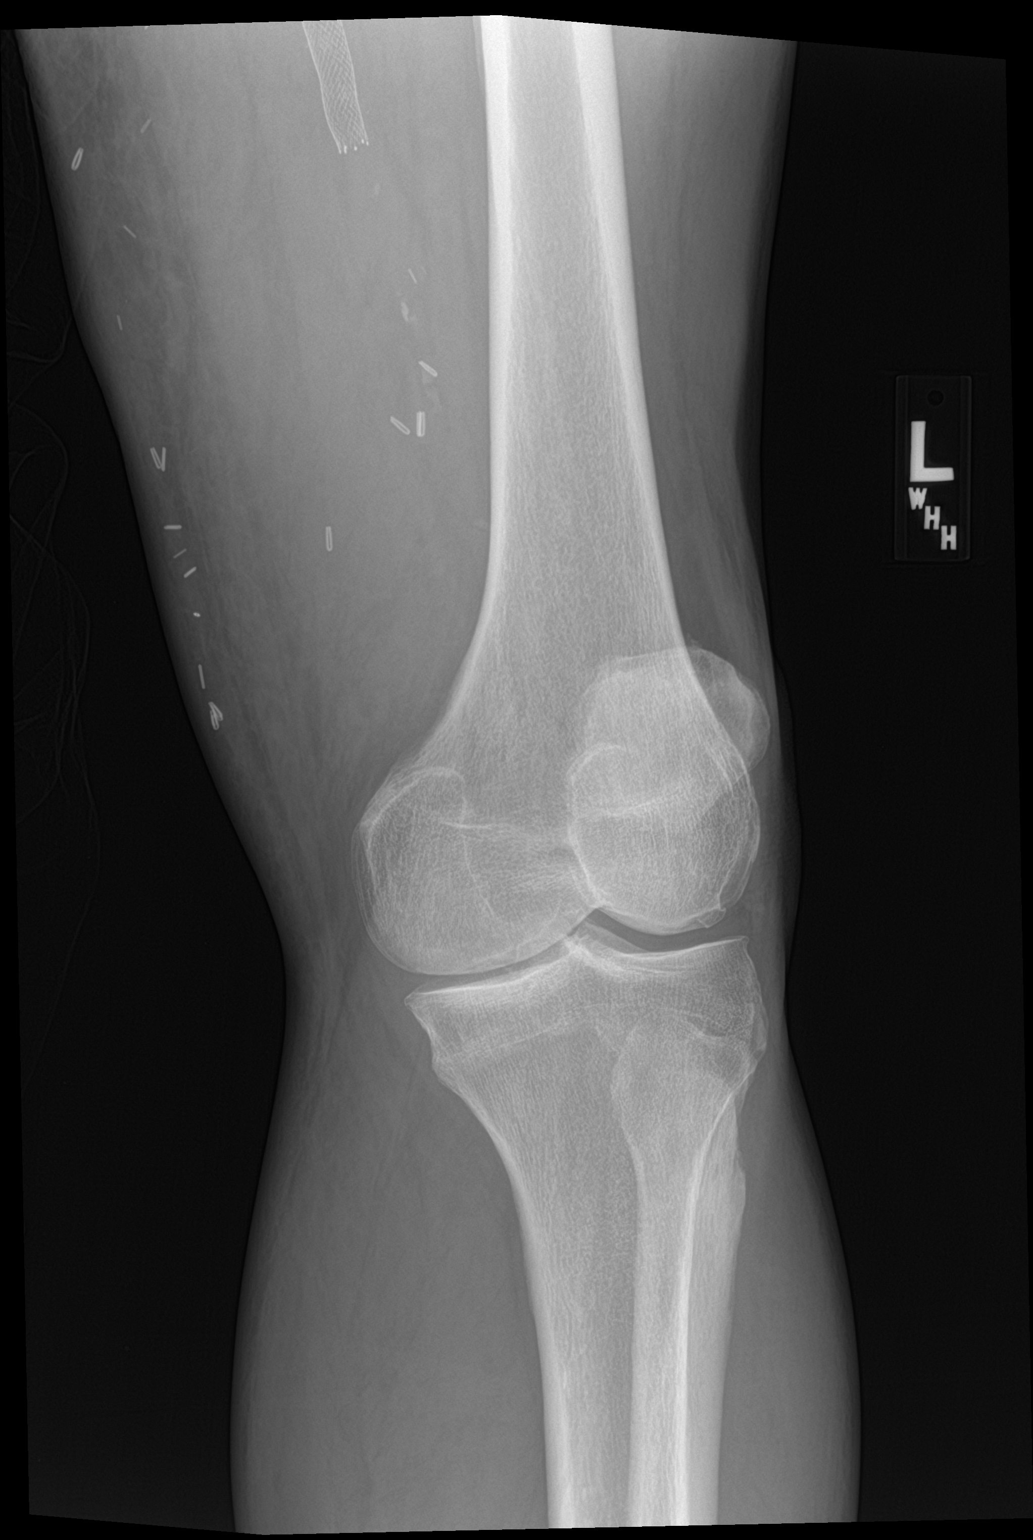

[knee ap (3 of 3)]
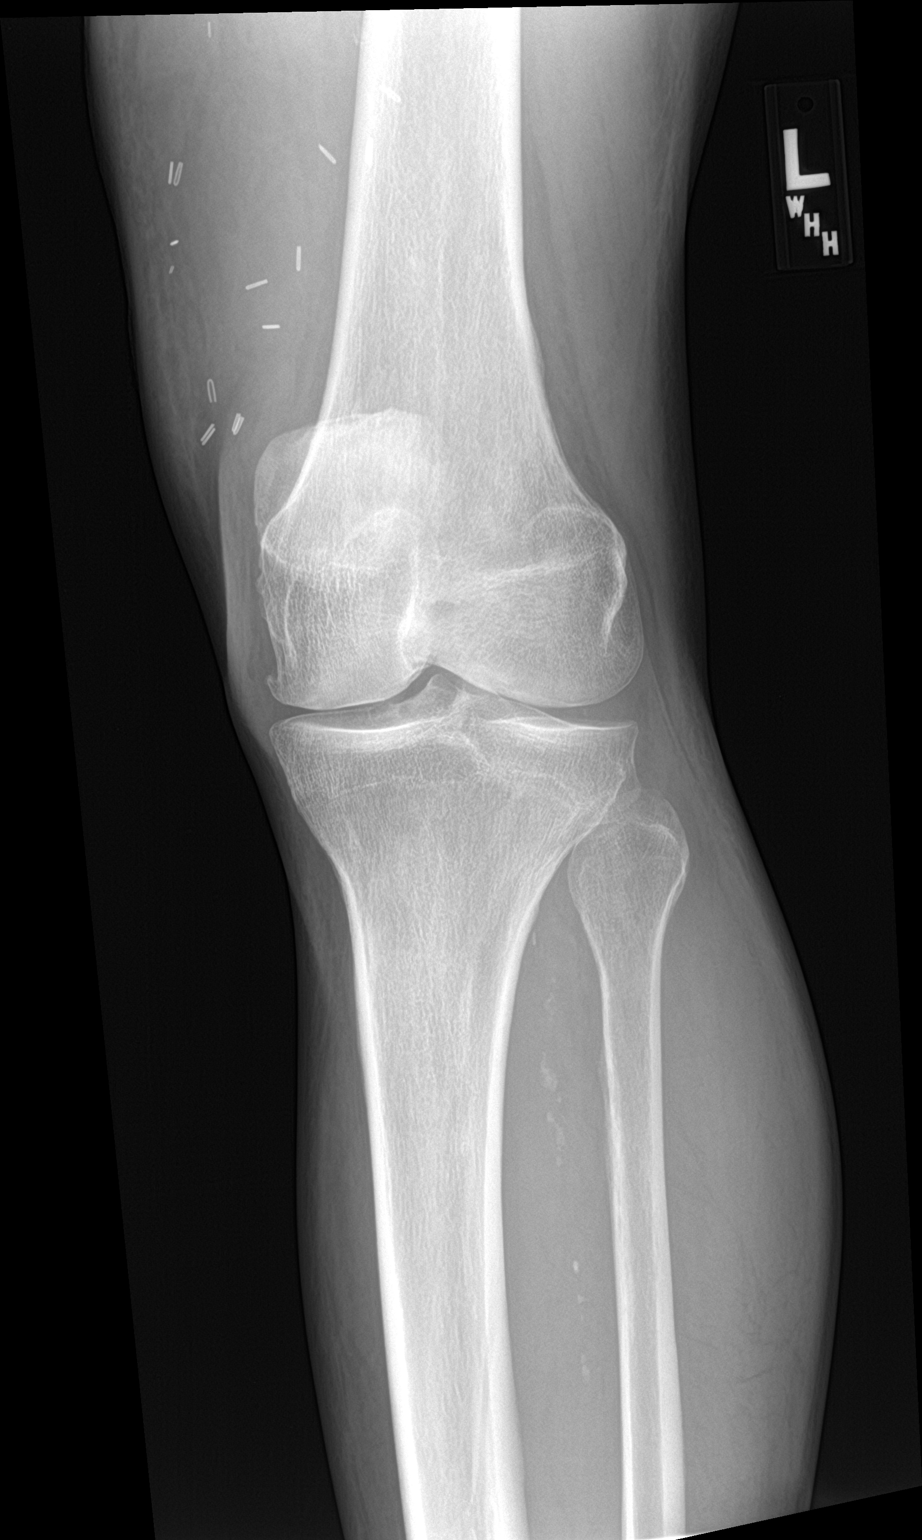

[4 of 4 positions shown; findings below may reference images not displayed]

FINDINGS: No evidence of fracture, dislocation, or joint effusion.
Osteoarthritis with tricompartmental peripheral spurring and mild
diffuse joint space narrowing. Surgical clips in the medial soft
tissues of the distal thigh. Soft tissue edema most prominent
medially.
IMPRESSION: 1. Tricompartmental osteoarthritis without acute osseous
abnormality.
2. Soft tissue edema.

## 2019-07-21 ENCOUNTER — Telehealth: Payer: Self-pay | Admitting: *Deleted

## 2019-07-21 DIAGNOSIS — Z006 Encounter for examination for normal comparison and control in clinical research program: Secondary | ICD-10-CM

## 2019-07-21 NOTE — Telephone Encounter (Signed)
LVM to call [facility] [phone]) to reschedule [facility] 4 appointment.

## 2019-07-23 ENCOUNTER — Telehealth: Payer: Self-pay | Admitting: *Deleted

## 2019-07-23 NOTE — Telephone Encounter (Signed)
I called patient to schedule Joshua Hall 4 study visit. I left voice mail on home phone. I also called patient's cell phone but voice mail full at this time.

## 2019-08-04 ENCOUNTER — Other Ambulatory Visit (HOSPITAL_COMMUNITY): Payer: Self-pay | Admitting: Internal Medicine

## 2019-08-04 ENCOUNTER — Other Ambulatory Visit: Payer: Self-pay | Admitting: Internal Medicine

## 2019-08-04 DIAGNOSIS — R1030 Lower abdominal pain, unspecified: Secondary | ICD-10-CM

## 2019-08-10 ENCOUNTER — Telehealth: Payer: Self-pay | Admitting: *Deleted

## 2019-08-10 DIAGNOSIS — Z006 Encounter for examination for normal comparison and control in clinical research program: Secondary | ICD-10-CM

## 2019-08-10 NOTE — Telephone Encounter (Signed)
LVM for pt to call [facility] at [phone] to reschedule [facility] 4 visit.

## 2019-08-26 ENCOUNTER — Other Ambulatory Visit: Payer: Self-pay

## 2019-08-26 ENCOUNTER — Ambulatory Visit (HOSPITAL_COMMUNITY)
Admission: RE | Admit: 2019-08-26 | Discharge: 2019-08-26 | Disposition: A | Discharge status: Home / Self Care | Payer: Medicare Other | Source: Ambulatory Visit | Attending: Internal Medicine | Admitting: Internal Medicine

## 2019-08-26 DIAGNOSIS — R1030 Lower abdominal pain, unspecified: Secondary | ICD-10-CM | POA: Insufficient documentation

## 2019-08-26 LAB — POCT I-STAT CREATININE: Creatinine, Ser: 1.2 mg/dL (ref 0.61–1.24)

## 2019-08-26 MED ORDER — IOHEXOL 300 MG/ML  SOLN
100.0000 mL | Freq: Once | INTRAMUSCULAR | Status: AC | PRN
  Administered 2019-08-26: 100 mL via INTRAVENOUS

## 2019-09-09 ENCOUNTER — Encounter: Payer: Self-pay | Admitting: Internal Medicine

## 2019-09-09 ENCOUNTER — Telehealth: Payer: Self-pay | Admitting: *Deleted

## 2019-09-09 DIAGNOSIS — Z006 Encounter for examination for normal comparison and control in clinical research program: Secondary | ICD-10-CM

## 2019-09-09 NOTE — Telephone Encounter (Signed)
LVM to call [phone] to re-schedule [facility] 4 visit.

## 2019-09-15 ENCOUNTER — Ambulatory Visit: Payer: Medicare Other | Admitting: Gastroenterology

## 2019-09-16 ENCOUNTER — Telehealth: Payer: Self-pay | Admitting: *Deleted

## 2019-09-16 DIAGNOSIS — Z006 Encounter for examination for normal comparison and control in clinical research program: Secondary | ICD-10-CM

## 2019-09-16 NOTE — Telephone Encounter (Signed)
LVM to call [facility] at [phone] to reschedule [facility] 4 visit.

## 2019-10-08 ENCOUNTER — Encounter: Payer: Self-pay | Admitting: *Deleted

## 2019-10-08 DIAGNOSIS — Z006 Encounter for examination for normal comparison and control in clinical research program: Secondary | ICD-10-CM

## 2019-11-04 DIAGNOSIS — B070 Plantar wart: Secondary | ICD-10-CM | POA: Diagnosis not present

## 2019-11-04 DIAGNOSIS — Z23 Encounter for immunization: Secondary | ICD-10-CM | POA: Diagnosis not present

## 2019-11-04 DIAGNOSIS — E1159 Type 2 diabetes mellitus with other circulatory complications: Secondary | ICD-10-CM | POA: Diagnosis not present

## 2019-11-09 ENCOUNTER — Encounter: Payer: Self-pay | Admitting: Gastroenterology

## 2019-11-09 NOTE — Progress Notes (Deleted)
CT July 2021: Left colonic diverticulosis without active diverticulitis. Large stool burden. Colonoscopy with hyperplastic polyps and due in 2027.  

## 2019-11-10 ENCOUNTER — Ambulatory Visit: Payer: Medicare Other | Admitting: Gastroenterology

## 2019-11-10 ENCOUNTER — Encounter: Payer: Self-pay | Admitting: Internal Medicine

## 2019-11-16 DIAGNOSIS — B070 Plantar wart: Secondary | ICD-10-CM | POA: Diagnosis not present

## 2019-11-17 NOTE — Progress Notes (Deleted)
Called to reschedule [place] 4 follow-up appointment. Left voicemail to call back.

## 2019-11-24 ENCOUNTER — Ambulatory Visit: Payer: Medicare Other | Admitting: Cardiovascular Disease

## 2019-12-15 ENCOUNTER — Ambulatory Visit: Payer: Medicare HMO | Admitting: Cardiovascular Disease

## 2020-03-04 ENCOUNTER — Telehealth: Payer: Self-pay | Admitting: *Deleted

## 2020-03-04 DIAGNOSIS — Z006 Encounter for examination for normal comparison and control in clinical research program: Secondary | ICD-10-CM

## 2020-03-04 NOTE — Telephone Encounter (Signed)
Pt would still like to participate in the [place] 4 study. Appointment made for 03/08/2020 at 11:00

## 2020-03-08 ENCOUNTER — Ambulatory Visit: Payer: Medicare HMO | Admitting: Cardiovascular Disease

## 2020-03-16 ENCOUNTER — Encounter: Payer: Medicare HMO | Admitting: *Deleted

## 2020-03-16 ENCOUNTER — Other Ambulatory Visit: Payer: Self-pay

## 2020-03-16 DIAGNOSIS — Z006 Encounter for examination for normal comparison and control in clinical research program: Secondary | ICD-10-CM

## 2020-03-16 NOTE — Research (Addendum)
Subject Name: Joshua Hall  Subject met inclusion and exclusion criteria.  The informed consent form, study requirements and expectations were reviewed with the subject and questions and concerns were addressed prior to the signing of the consent form.  The subject verbalized understanding of the trial requirements.  The subject agreed to participate in the [facility] 4 trial and signed the informed consent at 1102 on 03/31/20  The informed consent was obtained prior to performance of any protocol-specific procedures for the subject.  A copy of the signed informed consent was given to the subject and a copy was placed in the subject's medical record.   Pelletier, Erika Dawn  Pt re consented to Addendum to the consent for participant Information  [facility] 4 Version 09 Mar 2020       Pt to research clinic for [facility] 4 visit 5. Pt doing well. No ae/SAE or medication changes to report. Injection given in left ABD. Pt tolerated well.  

## 2020-03-22 ENCOUNTER — Ambulatory Visit (INDEPENDENT_AMBULATORY_CARE_PROVIDER_SITE_OTHER): Payer: Medicare HMO | Admitting: Cardiovascular Disease

## 2020-03-22 ENCOUNTER — Other Ambulatory Visit: Payer: Self-pay

## 2020-03-22 ENCOUNTER — Encounter: Payer: Self-pay | Admitting: Cardiovascular Disease

## 2020-03-22 VITALS — BP 128/72 | HR 77 | Ht 72.0 in | Wt 217.4 lb

## 2020-03-22 DIAGNOSIS — I10 Essential (primary) hypertension: Secondary | ICD-10-CM

## 2020-03-22 DIAGNOSIS — I739 Peripheral vascular disease, unspecified: Secondary | ICD-10-CM

## 2020-03-22 DIAGNOSIS — I2510 Atherosclerotic heart disease of native coronary artery without angina pectoris: Secondary | ICD-10-CM

## 2020-03-22 DIAGNOSIS — E785 Hyperlipidemia, unspecified: Secondary | ICD-10-CM | POA: Diagnosis not present

## 2020-03-22 MED ORDER — CILOSTAZOL 50 MG PO TABS: 50.0000 mg | ORAL_TABLET | Freq: Two times a day (BID) | ORAL | 3 refills | Status: DC

## 2020-03-22 MED ORDER — LOSARTAN POTASSIUM 50 MG PO TABS: 50.0000 mg | ORAL_TABLET | Freq: Every day | ORAL | 3 refills | Status: DC

## 2020-03-22 NOTE — Patient Instructions (Signed)
Medication Instructions:  STOP- Amlodipine  DECREASE- Losartan 50 mg by mouth daily  *If you need a refill on your cardiac medications before your next appointment, please call your pharmacy*   Lab Work: None Ordered   Testing/Procedures: None Ordered   Follow-Up: At [facility], you and your health needs are our priority.  As part of our continuing mission to provide you with exceptional heart care, we have created designated Provider Care Teams.  These Care Teams include your primary Cardiologist (physician) and Advanced Practice Providers (APPs -  Physician Assistants and Nurse Practitioners) who all work together to provide you with the care you need, when you need it.  We recommend signing up for the patient portal called "MyChart".  Sign up information is provided on this After Visit Summary.  MyChart is used to connect with patients for Virtual Visits (Telemedicine).  Patients are able to view lab/test results, encounter notes, upcoming appointments, etc.  Non-urgent messages can be sent to your provider as well.   To learn more about what you can do with MyChart, go to [email or web address].    Your next appointment:   6 month(s)  The format for your next appointment:   In Person  Provider:   You may see Kathlyn Sacramento, MD or one of the following Advanced Practice Providers on your designated Care Team:    Kerin Ransom, PA-C  [place], [place]  Coletta Memos, [place]

## 2020-03-22 NOTE — Progress Notes (Signed)
Cardiology Office Note   Date:  03/22/2020   ID:  Joshua, Hall [birth year], MRN [id number]  PCP:  Celene Squibb, MD  Cardiologist:   Kathlyn Sacramento, MD   No chief complaint on file.     History of Present Illness: Joshua Hall is a 65 y.o. male who presents for a follow-up visit a follow-up visit regarding peripheral arterial disease and coronary artery disease.  He has chronic medical conditions that include previous tobacco use, hypertension and hyperlipidemia.  He is known to have peripheral arterial disease with claudication.  He had angiography in 2015 which showed severe mid to distal left SFA stenosis with evidence of plaque rupture.  I performed successful self-expanding stent placement at that time.   He had unstable angina in March of 2019. Cardiac catheterization showed 90% distal LAD stenosis close to the apex, occluded second diagonal and no other obstructive disease.  LVEDP was normal and ejection fraction was 55 to 60%.  The distal LAD was too small to stent and I have recommended medical therapy. He is status post femoral to above-the-knee popliteal artery bypass in August, 2019 for severe claudication and recurrent SFA occlusion.  He had recent recurrent left calf claudication and underwent angiography which showed occluded femoropopliteal bypass and chronically occluded SFA with extensive collaterals from the profunda.  No revascularization was performed. He was started on Pletal and had improvement in symptoms.  He has been doing reasonably well and denies any chest pain or shortness of breath.  He ran out of all his medications 1 years ago including antihypertensive medications.  He continues to smoke intermittently.  He hunts rabbits for a hobby.   Past Medical History:  Diagnosis Date  . Colitis   . COPD (chronic obstructive pulmonary disease) ([place])   . Coronary atherosclerosis of native coronary artery    a. cath in 2013 showing nonobstructive disease  along the LAD and RCA with 99% stenosis of D2 --> too small for intervention, medical therapy recommended b. normal NST in 10/2013 c. 04/2017 cath showing 90% distal-LAD stenosis and occluded D2 with medical management recommended  . Hyperlipidemia   . Hypertension   . Myocardial infarction [facility]) ?2013; ?2015  . PAD (peripheral artery disease) ([place])    a. s/p stent placement to the left SFA in 2015  . Tobacco use     Past Surgical History:  Procedure Laterality Date  . ABDOMINAL AORTAGRAM N/A 12/23/2013   Procedure: ABDOMINAL Maxcine Ham;  Surgeon: [name], MD;  Location: [place] CATH LAB;  Service: Cardiovascular;  Laterality: N/A;  . ABDOMINAL AORTOGRAM N/A 09/18/2017   Procedure: ABDOMINAL AORTOGRAM;  Surgeon: [name], MD;  Location: [place] CV LAB;  Service: Cardiovascular;  Laterality: N/A;  . ABDOMINAL AORTOGRAM W/LOWER EXTREMITY Left 03/25/2019   Procedure: ABDOMINAL AORTOGRAM W/LOWER EXTREMITY;  Surgeon: [name], MD;  Location: [place] CV LAB;  Service: Cardiovascular;  Laterality: Left;  . CARDIAC CATHETERIZATION  10/2011   [place]  . COLONOSCOPY N/A 03/16/2015   SLF: 1. No source for abdominal pain identified. 2. six colorectal polyps removed. hyperplastic. 3. the colon was redundant 4. mild diverticulosis noted in the sigmoid colon 5. small internal hemorroids.   . ESOPHAGOGASTRODUODENOSCOPY N/A 03/16/2015   SLF: 1. No source for abdominal pain identified. 2. mild non-erosive gastritis and duodentitis.   . FEMORAL-POPLITEAL BYPASS GRAFT Left 10/08/2017   Procedure: Left  FEMORAL-POPLITEAL ARTERY Bypass Graft;  Surgeon: Waynetta Sandy, MD;  Location: [facility] OR;  Service: Vascular;  Laterality: Left;  . HERNIA REPAIR    . INSERTION OF MESH N/A 04/13/2015   Procedure: INSERTION OF MESH;  Surgeon: [name], MD;  Location: [place];  Service: General;  Laterality: N/A;  . LEFT HEART CATH AND CORONARY ANGIOGRAPHY N/A  05/01/2017   Procedure: LEFT HEART CATH AND CORONARY ANGIOGRAPHY;  Surgeon: [name], MD;  Location: [place] CV LAB;  Service: Cardiovascular;  Laterality: N/A;  . LEFT HEART CATHETERIZATION WITH CORONARY ANGIOGRAM N/A 10/19/2011   Procedure: LEFT HEART CATHETERIZATION WITH CORONARY ANGIOGRAM;  Surgeon: [name], MD;  [facility] CATH [facility];  Service: Cardiovascular;  Laterality: N/A;  . LOWER EXTREMITY ANGIOGRAPHY Left 09/18/2017   Procedure: Lower Extremity Angiography;  Surgeon: [name], MD;  Location: [place] CV LAB;  Service: Cardiovascular;  Laterality: Left;  . MOUTH SURGERY     "all my teeth removed then cosmetic OR for my dentures to fit"  . UMBILICAL HERNIA REPAIR N/A 04/13/2015   Procedure:  UMBILICAL HERNIA REPAIR;  Surgeon: [name], MD;  Location: [place];  Service: General;  Laterality: N/A;     Current Outpatient Medications  Medication Sig Dispense Refill  . albuterol (PROVENTIL HFA;VENTOLIN HFA) 108 (90 BASE) MCG/ACT inhaler Inhale 2 puffs into the lungs every 6 (six) hours as needed for wheezing or shortness of breath. 1 Inhaler 0  . amLODipine (NORVASC) 10 MG tablet Take 1 tablet (10 mg total) by mouth daily. 30 tablet 3  . aspirin 81 MG EC tablet Take 1 tablet (81 mg total) by mouth daily. With food 30 tablet 6  . cilostazol (PLETAL) 50 MG tablet Take 1 tablet (50 mg total) by mouth 2 (two) times daily. 60 tablet 11  . EPIPEN 2-PAK 0.3 MG/0.3ML SOAJ injection Inject 0.3 mg as directed as needed for anaphylaxis. Allergic reaction  1  . losartan (COZAAR) 100 MG tablet Take 1 tablet (100 mg total) by mouth daily. For treatment of high blood pressure and for your heart. 30 tablet 5  . nitroGLYCERIN (NITROSTAT) 0.4 MG SL tablet Place 1 tablet (0.4 mg total) under the tongue every 5 (five) minutes as needed for chest pain (3 doses MAX). 25 tablet 1  . pantoprazole (PROTONIX) 20 MG tablet Take 1 tablet (20 mg total) by mouth daily.  30 tablet 0  . rosuvastatin (CRESTOR) 20 MG tablet Take 1 tablet (20 mg total) by mouth daily. 90 tablet 3  . Study - ORION 4 - inclisiran 300 mg/1.7mL or placebo SQ injection (PI-Stuckey) Inject 300 mg into the skin every 6 (six) months.     No current facility-administered medications for this visit.    Allergies:   Bee venom    Social History:  The patient  reports that he has been smoking cigarettes. He has a 47.00 pack-year smoking history. He has never used smokeless tobacco. He reports current alcohol use of about 3.0 standard drinks of alcohol per week. He reports previous drug use.   Family History:  The patient's family history includes Colon cancer in his maternal uncle; Diabetes in his father and mother; Heart disease in his brother; Stroke in his father.    ROS:  Please see the history of present illness.   Otherwise, review of systems are positive for none.   All other systems are reviewed and negative.    PHYSICAL EXAM: VS:  BP 128/72   Pulse 77   Ht 6' (1.829  m)   Wt 217 lb 6.4 oz (98.6 kg)   SpO2 99%   BMI 29.48 kg/m  , BMI Body mass index is 29.48 kg/m. GEN: Well nourished, well developed, in no acute distress  HEENT: normal  Neck: no JVD, carotid bruits, or masses Cardiac: RRR; no murmurs, rubs, or gallops,no edema  Respiratory:  clear to auscultation bilaterally, normal work of breathing GI: soft, nontender, nondistended, + BS MS: no deformity or atrophy  Skin: warm and dry, no rash Neuro:  Strength and sensation are intact Psych: euthymic mood, full affect  EKG:  EKG is ordered today. EKG showed normal sinus rhythm with moderate LVH and poor R wave progression in the anterior leads   Recent Labs: 08/26/2019: Creatinine, Ser 1.20    Lipid Panel    Component Value Date/Time   CHOL 214 (H) 03/17/2019 1031   TRIG 120 03/17/2019 1031   HDL 72 03/17/2019 1031   CHOLHDL 3.0 03/17/2019 1031   CHOLHDL 3.1 08/07/2017 0836   VLDL 19 08/07/2017 0836    LDLCALC 121 (H) 03/17/2019 1031      Wt Readings from Last 3 Encounters:  03/22/20 217 lb 6.4 oz (98.6 kg)  05/19/19 208 lb 3.2 oz (94.4 kg)  03/17/19 207 lb 3.2 oz (94 kg)      No flowsheet data found.    ASSESSMENT AND PLAN:  1.  Coronary artery disease involving native coronary arteries with other forms of angina: Distal LAD stenosis being treated medically.  Currently with no chest pain.  Continue medical therapy I refilled his medications.  2.  Peripheral arterial disease: Status post left femoral-popliteal bypass in 2019 which was found to be occluded on recent angiography.  He reports stable mild left leg claudication.  Continue Pletal.  3.    Intermittent tobacco use: Discussed with him the importance of complete abstinence.  4.  Essential hypertension: Blood pressure is controlled without medications.  Thus, I elected to discontinue amlodipine and decrease losartan to 50 mg daily.  5.   Hyperlipidemia: He ran out of rosuvastatin and this was refilled today.  Recommend a target LDL of less than 70.   Disposition:   FU with with me In 6 months  Signed,  Kathlyn Sacramento, MD  03/22/2020 10:51 AM    [place]

## 2020-03-25 NOTE — Addendum Note (Signed)
Addended by: Wonda Horner on: 03/25/2020 04:54 PM   Modules accepted: Orders

## 2020-04-04 ENCOUNTER — Encounter: Payer: Self-pay | Admitting: Internal Medicine

## 2020-05-26 ENCOUNTER — Ambulatory Visit: Payer: Medicare HMO | Admitting: Gastroenterology

## 2020-05-26 NOTE — Progress Notes (Deleted)
CT July 2021: Left colonic diverticulosis without active diverticulitis. Large stool burden. Colonoscopy with hyperplastic polyps and due in 2027.

## 2020-06-13 ENCOUNTER — Encounter: Payer: Self-pay | Admitting: Internal Medicine

## 2020-08-04 ENCOUNTER — Ambulatory Visit: Payer: Medicare HMO | Admitting: Gastroenterology

## 2020-08-06 ENCOUNTER — Other Ambulatory Visit: Payer: Self-pay

## 2020-08-06 ENCOUNTER — Emergency Department (HOSPITAL_COMMUNITY)
Admission: EM | Admit: 2020-08-06 | Discharge: 2020-08-06 | Disposition: A | Discharge status: Home / Self Care | Payer: Medicare HMO | Attending: Emergency Medicine | Admitting: Emergency Medicine

## 2020-08-06 ENCOUNTER — Emergency Department (HOSPITAL_COMMUNITY): Payer: Medicare HMO

## 2020-08-06 ENCOUNTER — Encounter (HOSPITAL_COMMUNITY): Payer: Self-pay | Admitting: *Deleted

## 2020-08-06 DIAGNOSIS — I700 Atherosclerosis of aorta: Secondary | ICD-10-CM | POA: Diagnosis not present

## 2020-08-06 DIAGNOSIS — F17210 Nicotine dependence, cigarettes, uncomplicated: Secondary | ICD-10-CM | POA: Diagnosis not present

## 2020-08-06 DIAGNOSIS — Z7982 Long term (current) use of aspirin: Secondary | ICD-10-CM | POA: Diagnosis not present

## 2020-08-06 DIAGNOSIS — R69 Illness, unspecified: Secondary | ICD-10-CM | POA: Diagnosis not present

## 2020-08-06 DIAGNOSIS — I2510 Atherosclerotic heart disease of native coronary artery without angina pectoris: Secondary | ICD-10-CM | POA: Insufficient documentation

## 2020-08-06 DIAGNOSIS — R1032 Left lower quadrant pain: Secondary | ICD-10-CM | POA: Diagnosis not present

## 2020-08-06 DIAGNOSIS — R319 Hematuria, unspecified: Secondary | ICD-10-CM | POA: Diagnosis not present

## 2020-08-06 DIAGNOSIS — I10 Essential (primary) hypertension: Secondary | ICD-10-CM | POA: Diagnosis not present

## 2020-08-06 DIAGNOSIS — K5750 Diverticulosis of both small and large intestine without perforation or abscess without bleeding: Secondary | ICD-10-CM | POA: Diagnosis not present

## 2020-08-06 DIAGNOSIS — J449 Chronic obstructive pulmonary disease, unspecified: Secondary | ICD-10-CM | POA: Insufficient documentation

## 2020-08-06 DIAGNOSIS — Z79899 Other long term (current) drug therapy: Secondary | ICD-10-CM | POA: Insufficient documentation

## 2020-08-06 DIAGNOSIS — R109 Unspecified abdominal pain: Secondary | ICD-10-CM | POA: Diagnosis present

## 2020-08-06 LAB — COMPREHENSIVE METABOLIC PANEL
ALT: 22 U/L (ref 0–44)
AST: 22 U/L (ref 15–41)
Albumin: 3.8 g/dL (ref 3.5–5.0)
Alkaline Phosphatase: 74 U/L (ref 38–126)
Anion gap: 6 (ref 5–15)
BUN: 18 mg/dL (ref 8–23)
CO2: 25 mmol/L (ref 22–32)
Calcium: 9.3 mg/dL (ref 8.9–10.3)
Chloride: 108 mmol/L (ref 98–111)
Creatinine, Ser: 1.14 mg/dL (ref 0.61–1.24)
GFR, Estimated: >60 mL/min (ref >60)
Glucose, Bld: 110 mg/dL — ABNORMAL HIGH (ref 70–99)
Potassium: 4 mmol/L (ref 3.5–5.1)
Sodium: 139 mmol/L (ref 135–145)
Total Bilirubin: 0.3 mg/dL (ref 0.3–1.2)
Total Protein: 6.7 g/dL (ref 6.5–8.1)

## 2020-08-06 LAB — CBC WITH DIFFERENTIAL/PLATELET
Abs Immature Granulocytes: 0.02 10*3/uL (ref 0.00–0.07)
Basophils Absolute: 0 10*3/uL (ref 0.0–0.1)
Basophils Relative: 0 %
Eosinophils Absolute: 0.2 10*3/uL (ref 0.0–0.5)
Eosinophils Relative: 3 %
HCT: 39.6 % (ref 39.0–52.0)
Hemoglobin: 14.1 g/dL (ref 13.0–17.0)
Immature Granulocytes: 0 %
Lymphocytes Relative: 35 %
Lymphs Abs: 2.5 10*3/uL (ref 0.7–4.0)
MCH: 30.4 pg (ref 26.0–34.0)
MCHC: 35.6 g/dL (ref 30.0–36.0)
MCV: 85.3 fL (ref 80.0–100.0)
Monocytes Absolute: 0.6 10*3/uL (ref 0.1–1.0)
Monocytes Relative: 9 %
Neutro Abs: 3.6 10*3/uL (ref 1.7–7.7)
Neutrophils Relative %: 53 %
Platelets: 277 10*3/uL (ref 150–400)
RBC: 4.64 MIL/uL (ref 4.22–5.81)
RDW: 14.3 % (ref 11.5–15.5)
WBC: 7 10*3/uL (ref 4.0–10.5)
nRBC: 0 % (ref 0.0–0.2)

## 2020-08-06 LAB — URINALYSIS, ROUTINE W REFLEX MICROSCOPIC
Bacteria, UA: NONE SEEN
Bilirubin Urine: NEGATIVE
Glucose, UA: NEGATIVE mg/dL
Ketones, ur: NEGATIVE mg/dL
Leukocytes,Ua: NEGATIVE
Nitrite: NEGATIVE
Protein, ur: NEGATIVE mg/dL
RBC / HPF: >50 RBC/hpf — ABNORMAL HIGH (ref 0–5)
Specific Gravity, Urine: 1.028 (ref 1.005–1.030)
pH: 5 (ref 5.0–8.0)

## 2020-08-06 MED ORDER — HYDROCODONE-ACETAMINOPHEN 5-325 MG PO TABS
1.0000 | ORAL_TABLET | Freq: Once | ORAL | Status: AC
  Administered 2020-08-06: 1 via ORAL
  Filled 2020-08-06: qty 1

## 2020-08-06 NOTE — ED Triage Notes (Signed)
Lower abdominal pain radiating from umbilicus to back area

## 2020-08-06 NOTE — ED Notes (Signed)
Pt ambulated to restroom at this time with no problems noted. Joshua Hall

## 2020-08-06 NOTE — Discharge Instructions (Addendum)
We saw you in the ER for abdominal pain. All the results in the ER are normal, labs and imaging. We are not sure what is causing your symptoms. The workup in the ER is not complete, and is limited to screening for life threatening and emergent conditions only, so please see a primary care doctor for further evaluation.  

## 2020-08-06 NOTE — ED Notes (Signed)
Patient to CT at this time

## 2020-08-06 NOTE — ED Provider Notes (Addendum)
[facility] EMERGENCY DEPARTMENT Provider Note   CSN: [id number] Arrival date & time: 08/06/20  1713     History Chief Complaint  Patient presents with   Abdominal Pain    Joshua Hall is a 65 y.o. male.  HPI    65 year old male comes in with chief complaint of abdominal pain.  He has history of COPD, CAD, hypertension, hyperlipidemia and PAD.  He reports left-sided abdominal pain.  Pain is located over the left abdominal region, radiating towards the back.  The pain is constant and it is worse with any kind of movement or with him turning.  He denies any associated nausea, vomiting, fevers, chills, diarrhea, UTI-like symptoms.  No history of bloody stools.  No history of similar pain in the past.  The pain was intermittent yesterday but today is more constant.  He has taken Tylenol at home without significant pain relief.  Patient smokes about half a pack a day.  He denies any lower extremity weakness, numbness, tingling  Past Medical History:  Diagnosis Date   Colitis    COPD (chronic obstructive pulmonary disease) ([place])    Coronary atherosclerosis of native coronary artery    a. cath in 2013 showing nonobstructive disease along the LAD and RCA with 99% stenosis of D2 --> too small for intervention, medical therapy recommended b. normal NST in 10/2013 c. 04/2017 cath showing 90% distal-LAD stenosis and occluded D2 with medical management recommended   Hyperlipidemia    Hypertension    Myocardial infarction [facility]) ?2013; ?2015   PAD (peripheral artery disease) ([place])    a. 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   PAD (peripheral artery disease) ([place]) 12/15/2013   Substernal chest pain 11/03/2013   Unstable angina ([place]) 11/03/2013   Tobacco use 57/26/2035   Periumbilical pain 59/74/1638   Essential hypertension, benign 01/20/2012   Coronary atherosclerosis of native coronary artery 10/26/2011   Mixed hyperlipidemia 10/20/2011    Past Surgical History:  Procedure Laterality Date   ABDOMINAL AORTAGRAM N/A 12/23/2013   Procedure: ABDOMINAL Maxcine Ham;  Surgeon: [name], MD;  Location: [place] CATH LAB;  Service: Cardiovascular;  Laterality: N/A;   ABDOMINAL AORTOGRAM N/A 09/18/2017   Procedure: ABDOMINAL AORTOGRAM;  Surgeon: [name], MD;  Location: [place] CV LAB;  Service: Cardiovascular;  Laterality: N/A;   ABDOMINAL AORTOGRAM W/LOWER EXTREMITY Left 03/25/2019   Procedure: ABDOMINAL AORTOGRAM W/LOWER EXTREMITY;  Surgeon: [name], MD;  Location: [place] CV LAB;  Service: Cardiovascular;  Laterality: Left;   CARDIAC CATHETERIZATION  10/2011   [place]   COLONOSCOPY N/A 03/16/2015   SLF: 1. No source for abdominal pain identified. 2. six colorectal polyps removed. hyperplastic. 3. the colon was redundant 4. mild diverticulosis noted in the sigmoid colon 5. small internal hemorroids.    ESOPHAGOGASTRODUODENOSCOPY N/A 03/16/2015   SLF: 1. No source for abdominal pain identified. 2. mild non-erosive gastritis and duodentitis.    FEMORAL-POPLITEAL BYPASS GRAFT Left 10/08/2017   Procedure: Left  FEMORAL-POPLITEAL ARTERY  Bypass Graft;  Surgeon: Waynetta Sandy, MD;  Location: [place];  Service: Vascular;  Laterality: Left;   HERNIA REPAIR     INSERTION OF MESH N/A 04/13/2015   Procedure: INSERTION OF MESH;  Surgeon: [name], MD;  Location: [place];  Service: General;  Laterality: N/A;   LEFT HEART CATH AND CORONARY ANGIOGRAPHY N/A 05/01/2017    Procedure: LEFT HEART CATH AND CORONARY ANGIOGRAPHY;  Surgeon: [name], MD;  Location: [place] CV LAB;  Service: Cardiovascular;  Laterality: N/A;   LEFT HEART CATHETERIZATION WITH CORONARY ANGIOGRAM N/A 10/19/2011   Procedure: LEFT HEART CATHETERIZATION WITH CORONARY ANGIOGRAM;  Surgeon: [name], MD;  Location: [facility] CATH LAB;  Service: Cardiovascular;  Laterality: N/A;   LOWER EXTREMITY ANGIOGRAPHY Left 09/18/2017   Procedure: Lower Extremity Angiography;  Surgeon: [name], MD;  Location: [place] CV LAB;  Service: Cardiovascular;  Laterality: Left;   MOUTH SURGERY     "all my teeth removed then cosmetic OR for my dentures to fit"   UMBILICAL HERNIA REPAIR N/A 04/13/2015   Procedure:  UMBILICAL HERNIA REPAIR;  Surgeon: [name], MD;  Location: [place];  Service: General;  Laterality: N/A;       Family History  Problem Relation Age of Onset   Diabetes Mother    Diabetes Father    Stroke Father    Colon cancer Maternal Uncle    Heart disease Brother        Died of MI at 55    Social History   Tobacco Use   Smoking status: Some Days    Packs/day: 1.00    Years: 47.00    Pack years: 47.00    Types: Cigarettes    Last attempt to quit: 09/26/2017    Years since quitting: 2.8   Smokeless tobacco: Never   Tobacco comments:    3 cigarettes a day  Vaping Use   Vaping Use: Never used  Substance Use Topics   Alcohol use: Yes    Alcohol/week: 3.0 standard drinks    Types: 1 Glasses of wine, 2 Cans of beer per week    Comment: occ   Drug use: Not Currently    Comment: "not recently" per pt    Home Medications Prior to Admission medications   Medication Sig Start Date End Date Taking? Authorizing Provider  albuterol (PROVENTIL HFA;VENTOLIN HFA) 108 (90 BASE) MCG/ACT inhaler Inhale 2 puffs into the lungs every 6 (six) hours as needed for wheezing or shortness of breath. 01/21/12   Kathie Dike, MD  aspirin 81 MG EC tablet Take 1  tablet (81 mg total) by mouth daily. With food 12/12/17   Roxan Hockey, MD  cilostazol (PLETAL) 50 MG tablet Take 1 tablet (50 mg total) by mouth 2 (two) times daily. 03/22/20   [name], MD  EPIPEN 2-PAK 0.3 MG/0.3ML SOAJ injection Inject 0.3 mg as directed as needed for anaphylaxis. Allergic reaction 10/08/14   [provider]  losartan (COZAAR) 50 MG tablet Take 1 tablet (50 mg total) by mouth daily. For treatment of high blood pressure and for your heart. 03/22/20   [name], MD  nitroGLYCERIN (NITROSTAT) 0.4 MG SL tablet Place 1 tablet (0.4 mg total) under the tongue every 5 (five) minutes as needed for chest pain (3 doses MAX). 09/03/17   [name], MD  pantoprazole (PROTONIX) 20 MG tablet Take 1 tablet (20 mg total) by mouth daily. 10/15/18  Noemi Chapel, MD  rosuvastatin (CRESTOR) 20 MG tablet Take 1 tablet (20 mg total) by mouth daily. 05/19/19   [name], MD  Study - ORION 4 - inclisiran 300 mg/1.61mL or placebo SQ injection (PI-Stuckey) Inject 300 mg into the skin every 6 (six) months. 01/22/18   [provider]    Allergies    Bee venom  Review of Systems   Review of Systems  Constitutional:  Positive for activity change.  Respiratory:  Negative for shortness of breath.   Cardiovascular:  Negative for chest pain.  Gastrointestinal:  Positive for abdominal pain. Negative for nausea and vomiting.  Genitourinary:  Negative for dysuria.  All other systems reviewed and are negative.  Physical Exam Updated Vital Signs BP (!) 147/87 (BP Location: Right Arm)   Pulse 61   Temp 98 F (36.7 C) (Oral)   Resp 18   Ht 6' (1.829 m)   Wt 97.5 kg   SpO2 97%   BMI 29.16 kg/m   Physical Exam Vitals and nursing note reviewed.  Constitutional:      Appearance: He is well-developed.  HENT:     Head: Atraumatic.  Cardiovascular:     Rate and Rhythm: Normal rate.  Pulmonary:     Effort: Pulmonary effort is normal.  Musculoskeletal:      Cervical back: Neck supple.  Skin:    General: Skin is warm.  Neurological:     Mental Status: He is alert and oriented to person, place, and time.    ED Results / Procedures / Treatments   Labs (all labs ordered are listed, but only abnormal results are displayed) Labs Reviewed  COMPREHENSIVE METABOLIC PANEL - Abnormal; Notable for the following components:      Result Value   Glucose, Bld 110 (*)    All other components within normal limits  URINALYSIS, ROUTINE W REFLEX MICROSCOPIC - Abnormal; Notable for the following components:   Hgb urine dipstick LARGE (*)    RBC / HPF >50 (*)    All other components within normal limits  CBC WITH DIFFERENTIAL/PLATELET    EKG None  Radiology CT Renal Stone Study  Result Date: 08/06/2020 CLINICAL DATA:  Flank pain, kidney stone suspected R sided abd and flank pain EXAM: CT ABDOMEN AND PELVIS WITHOUT CONTRAST TECHNIQUE: Multidetector CT imaging of the abdomen and pelvis was performed following the standard protocol without IV contrast. COMPARISON:  CT abdomen pelvis 08/26/2019 FINDINGS: Lower chest: Bilateral lower lobe subsegmental atelectasis. Hepatobiliary: No focal liver abnormality. No gallstones, gallbladder wall thickening, or pericholecystic fluid. No biliary dilatation. Pancreas: No focal lesion. Normal pancreatic contour. No surrounding inflammatory changes. No main pancreatic ductal dilatation. Spleen: Normal in size without focal abnormality. Adrenals/Urinary Tract: No adrenal nodule bilaterally. There is a 1.8 cm fluid density lesion within the right kidney that likely represents a simple renal cyst. Subcentimeter hypodensities are too small to characterize. No nephrolithiasis, no hydronephrosis, and no contour-deforming renal mass. No ureterolithiasis or hydroureter. The urinary bladder is unremarkable. Stomach/Bowel: Stomach is within normal limits. No evidence of bowel wall thickening or dilatation. Scattered colonic diverticulosis  with no acute diverticulitis. Appendix appears normal. Vascular/Lymphatic: No abdominal aorta or iliac aneurysm. Mild atherosclerotic plaque of the aorta and its branches. Left inguinal surgical changes. No abdominal, pelvic, or inguinal lymphadenopathy. Reproductive: Prostate is unremarkable. Other: No intraperitoneal free fluid. No intraperitoneal free gas. No organized fluid collection. Musculoskeletal: Similar-appearing likely soft tissue scarring/hernia repair along the umbilical area. No abdominal wall hernia or abnormality.  No suspicious lytic or blastic osseous lesions. No acute displaced fracture. L5-S1 intervertebral disc space vacuum phenomenon. Redemonstration of sacroiliac joint sclerosis. IMPRESSION: Scattered colonic diverticulosis with no acute diverticulitis. Electronically Signed   By: Iven Finn [name]   On: 08/06/2020 19:55    Procedures Procedures   Medications Ordered in ED Medications  HYDROcodone-acetaminophen (NORCO/VICODIN) 5-325 MG per tablet 1 tablet (1 tablet Oral Given 08/06/20 2200)    ED Course  I have reviewed the triage vital signs and the nursing notes.  Pertinent labs & imaging results that were available during my care of the patient were reviewed by me and considered in my medical decision making (see chart for details).    MDM Rules/Calculators/A&P                          65 year old male comes in a chief complaint of abdominal pain.  He is having left-sided abdominal pain that is radiating towards the flank region.  The pain is worse with any kind of movement.  Pain was intermittently present yesterday, but today has been more constant and severe.  No history of similar pain.  No UTI-like symptoms, bloody stools, bloody emesis..  Does not appear to be GERD pain.  Clinically does not appear to be SBO.  He does have history of CAD, PAD, we considered mesenteric ischemia in the differential diagnosis, however patient is not toxic appearing, has normal  hemodynamics and tenderness is not diffusely present.  His lab work-up is reassuring.  No white count elevation.  CT renal stone ordered to look for any hydronephrosis, stones, diverticulitis.  At this time, suspicion for thrombosis or infarct of solid organs is low.  10:35 PM Results of the ER work-up discussed.  Informed that there is mild blood in the urine otherwise all the lab work-up is reassuring, CT scan is not showing any acute findings.  Patient did respond to [place] here.  He is requesting some pain meds, but I informed him that it is best that we take over-the-counter medications for symptom management for now and see his primary care doctor for further evaluation, and return to the ER if his symptoms progress.  Patient understanding of the recommendation and the rationale behind it.  He will return to the ER if he starts having fevers, bloody stool, bloody vomit, bloody urine and worsening of the pain.   Final Clinical Impression(s) / ED Diagnoses Final diagnoses:  Left lower quadrant abdominal pain  Hematuria, unspecified type    Rx / DC Orders ED Discharge Orders     None          Varney Biles, MD 08/06/20 2236

## 2020-08-09 DIAGNOSIS — I6529 Occlusion and stenosis of unspecified carotid artery: Secondary | ICD-10-CM | POA: Diagnosis not present

## 2020-08-09 DIAGNOSIS — N200 Calculus of kidney: Secondary | ICD-10-CM | POA: Diagnosis not present

## 2020-08-18 ENCOUNTER — Ambulatory Visit: Payer: Medicare HMO | Admitting: Internal Medicine

## 2020-08-18 ENCOUNTER — Encounter: Payer: Self-pay | Admitting: Internal Medicine

## 2020-08-18 ENCOUNTER — Other Ambulatory Visit: Payer: Self-pay

## 2020-08-18 VITALS — BP 144/82 | HR 61 | Temp 97.7°F | Ht 72.0 in | Wt 201.6 lb

## 2020-08-18 DIAGNOSIS — R1033 Periumbilical pain: Secondary | ICD-10-CM

## 2020-08-18 NOTE — Progress Notes (Signed)
Referring Provider: Celene Squibb, MD Primary Care Physician:  Celene Squibb, MD Primary GI:  Dr. Abbey Chatters  Chief Complaint  Patient presents with   Abdominal Pain    Around navel, feels like it did when her had umbilical hernia repair 0258    HPI:   Joshua Hall is a 65 y.o. male who presents to clinic today for evaluation for abdominal pain.  Last seen in our clinic in 2017.  Chief complaint for me today is periumbilical abdominal pain.  Patient states his pain is intermittent, occurs at random.  Not necessarily associated with meals.  Usually mild but occasionally will be 10 out of 10 where he has to lay down and stop what he is doing altogether until it passes.  Has a history of umbilical hernia status postrepair in [place].  States this was approximately 4 years ago.  No change in bowel habits.  No melena hematochezia.  Pain does not radiate.  Last colonoscopy 2017 with 6 hyperplastic polyps removed.  Recall 2027.  No family history of colorectal malignancy.  Past Medical History:  Diagnosis Date   Colitis    COPD (chronic obstructive pulmonary disease) ([place])    Coronary atherosclerosis of native coronary artery    a. cath in 2013 showing nonobstructive disease along the LAD and RCA with 99% stenosis of D2 --> too small for intervention, medical therapy recommended b. normal NST in 10/2013 c. 04/2017 cath showing 90% distal-LAD stenosis and occluded D2 with medical management recommended   Hyperlipidemia    Hypertension    Myocardial infarction [facility]) ?2013; ?2015   PAD (peripheral artery disease) ([place])    a. s/p stent placement to the left SFA in 2015   Tobacco use     Past Surgical History:  Procedure Laterality Date   ABDOMINAL AORTAGRAM N/A 12/23/2013   Procedure: ABDOMINAL Maxcine Ham;  Surgeon: [name], MD;  Location: [place] CATH LAB;  Service: Cardiovascular;  Laterality: N/A;   ABDOMINAL AORTOGRAM N/A 09/18/2017   Procedure: ABDOMINAL AORTOGRAM;  Surgeon:  [name], MD;  Location: [place] CV LAB;  Service: Cardiovascular;  Laterality: N/A;   ABDOMINAL AORTOGRAM W/LOWER EXTREMITY Left 03/25/2019   Procedure: ABDOMINAL AORTOGRAM W/LOWER EXTREMITY;  Surgeon: [name], MD;  Location: [place] CV LAB;  Service: Cardiovascular;  Laterality: Left;   CARDIAC CATHETERIZATION  10/2011   [facility] [place]   COLONOSCOPY N/A 03/16/2015   SLF: 1. No source for abdominal pain identified. 2. six colorectal polyps removed. hyperplastic. 3. the colon was redundant 4. mild diverticulosis noted in the sigmoid colon 5. small internal hemorroids.    ESOPHAGOGASTRODUODENOSCOPY N/A 03/16/2015   SLF: 1. No source for abdominal pain identified. 2. mild non-erosive gastritis and duodentitis.    FEMORAL-POPLITEAL BYPASS GRAFT Left 10/08/2017   Procedure: Left  FEMORAL-POPLITEAL ARTERY Bypass Graft;  Surgeon: Waynetta Sandy, MD;  Location: [place];  Service: Vascular;  Laterality: Left;   HERNIA REPAIR     INSERTION OF MESH N/A 04/13/2015   Procedure: INSERTION OF MESH;  Surgeon: [name], MD;  Location: [place];  Service: General;  Laterality: N/A;   LEFT HEART CATH AND CORONARY ANGIOGRAPHY N/A 05/01/2017   Procedure: LEFT HEART CATH AND CORONARY ANGIOGRAPHY;  Surgeon: [name], MD;  Location: [place] CV LAB;  Service: Cardiovascular;  Laterality: N/A;   LEFT HEART CATHETERIZATION WITH CORONARY ANGIOGRAM N/A 10/19/2011   Procedure: LEFT HEART CATHETERIZATION WITH CORONARY ANGIOGRAM;  Surgeon:  [name], MD;  Location: [facility] CATH LAB;  Service: Cardiovascular;  Laterality: N/A;   LOWER EXTREMITY ANGIOGRAPHY Left 09/18/2017   Procedure: Lower Extremity Angiography;  Surgeon: [name], MD;  Location: [place] CV LAB;  Service: Cardiovascular;  Laterality: Left;   MOUTH SURGERY     "all my teeth removed then cosmetic OR for my dentures to fit"   UMBILICAL HERNIA REPAIR N/A 04/13/2015   Procedure:   UMBILICAL HERNIA REPAIR;  Surgeon: [name], MD;  Location: [place];  Service: General;  Laterality: N/A;    Current Outpatient Medications  Medication Sig Dispense Refill   albuterol (PROVENTIL HFA;VENTOLIN HFA) 108 (90 BASE) MCG/ACT inhaler Inhale 2 puffs into the lungs every 6 (six) hours as needed for wheezing or shortness of breath. 1 Inhaler 0   aspirin 81 MG EC tablet Take 1 tablet (81 mg total) by mouth daily. With food 30 tablet 6   cilostazol (PLETAL) 50 MG tablet Take 1 tablet (50 mg total) by mouth 2 (two) times daily. 180 tablet 3   EPIPEN 2-PAK 0.3 MG/0.3ML SOAJ injection Inject 0.3 mg as directed as needed for anaphylaxis. Allergic reaction  1   losartan (COZAAR) 50 MG tablet Take 1 tablet (50 mg total) by mouth daily. For treatment of high blood pressure and for your heart. 90 tablet 3   nitroGLYCERIN (NITROSTAT) 0.4 MG SL tablet Place 1 tablet (0.4 mg total) under the tongue every 5 (five) minutes as needed for chest pain (3 doses MAX). 25 tablet 1   pantoprazole (PROTONIX) 20 MG tablet Take 1 tablet (20 mg total) by mouth daily. 30 tablet 0   rosuvastatin (CRESTOR) 20 MG tablet Take 1 tablet (20 mg total) by mouth daily. (Patient not taking: Reported on 08/18/2020) 90 tablet 3   Study - ORION 4 - inclisiran 300 mg/1.20mL or placebo SQ injection (PI-Stuckey) Inject 300 mg into the skin every 6 (six) months. (Patient not taking: Reported on 08/18/2020)     No current facility-administered medications for this visit.    Allergies as of 08/18/2020 - Review Complete 08/18/2020  Allergen Reaction Noted   Bee venom Swelling 11/04/2013    Family History  Problem Relation Age of Onset   Diabetes Mother    Diabetes Father    Stroke Father    Colon cancer Maternal Uncle    Heart disease Brother        Died of MI at 66    Social History   Socioeconomic History   Marital status: Divorced    Spouse name: Not on file   Number of children: Not on file    Years of education: Not on file   Highest education level: Not on file  Occupational History   Not on file  Tobacco Use   Smoking status: Every Day    Packs/day: 1.00    Years: 47.00    Pack years: 47.00    Types: Cigarettes    Last attempt to quit: 09/26/2017    Years since quitting: 2.8   Smokeless tobacco: Never  Vaping Use   Vaping Use: Never used  Substance and Sexual Activity   Alcohol use: Yes    Alcohol/week: 3.0 standard drinks    Types: 1 Glasses of wine, 2 Cans of beer per week    Comment: occ   Drug use: Not Currently    Comment: "not recently" per pt   Sexual activity: Yes  Other Topics Concern   Not on  file  Social History Narrative   Not on file   Social Determinants of Health   Financial Resource Strain: Not on file  Food Insecurity: Not on file  Transportation Needs: Not on file  Physical Activity: Not on file  Stress: Not on file  Social Connections: Not on file    Subjective: Review of Systems  Constitutional:  Negative for chills and fever.  HENT:  Negative for congestion and hearing loss.   Eyes:  Negative for blurred vision and double vision.  Respiratory:  Negative for cough and shortness of breath.   Cardiovascular:  Negative for chest pain and palpitations.  Gastrointestinal:  Positive for abdominal pain. Negative for blood in stool, constipation, diarrhea, heartburn, melena and vomiting.  Genitourinary:  Negative for dysuria and urgency.  Musculoskeletal:  Negative for joint pain and myalgias.  Skin:  Negative for itching and rash.  Neurological:  Negative for dizziness and headaches.  Psychiatric/Behavioral:  Negative for depression. The patient is not nervous/anxious.     Objective: BP (!) 144/82   Pulse 61   Temp 97.7 F (36.5 C) (Temporal)   Ht 6' (1.829 m)   Wt 201 lb 9.6 oz (91.4 kg)   BMI 27.34 kg/m  Physical Exam Constitutional:      Appearance: Normal appearance.  HENT:     Head: Normocephalic and atraumatic.  Eyes:      Extraocular Movements: Extraocular movements intact.     Conjunctiva/sclera: Conjunctivae normal.  Cardiovascular:     Rate and Rhythm: Normal rate and regular rhythm.  Pulmonary:     Effort: Pulmonary effort is normal.     Breath sounds: Normal breath sounds.  Abdominal:     General: Bowel sounds are normal.     Palpations: Abdomen is soft.  Musculoskeletal:        General: Normal range of motion.     Cervical back: Normal range of motion and neck supple.  Skin:    General: Skin is warm.  Neurological:     General: No focal deficit present.     Mental Status: He is alert and oriented to person, place, and time.  Psychiatric:        Mood and Affect: Mood normal.        Behavior: Behavior normal.     Assessment: *Abdominal pain *Umbilical hernia status postrepair  Plan: Patient's physical exam is reassuring today.  I do not see any recurrence of his umbilical hernia.  He is mildly tender in the area.  Discussed that this pain could just be due to scar tissue from his surgery years ago.  He does state his pain becomes 10 out of 10 at times to the point that he has to lay down.  Discussed possible CT imaging versus having him see surgeon who performed his surgery prior and he would like to see surgeon to discuss further.  We will refer down to Dr. Brantley Stage in [place] for further evaluation.  We will plan on colonoscopy for colon cancer screening 2027.  Otherwise follow-up with GI as needed peer  08/18/2020 10:59 AM   Disclaimer: This note was dictated with voice recognition software. Similar sounding words can inadvertently be transcribed and may not be corrected upon review.

## 2020-08-18 NOTE — Progress Notes (Signed)
Mb re

## 2020-08-18 NOTE — Patient Instructions (Signed)
We will refer you back to Dr. Brantley Stage to evaluate your abdominal pain as this could be due to postoperative issues.  Physical exam reassuring today.  Do not feel this is bowel related.  Do not think you need repeat colonoscopy at this time.  We will plan on colonoscopy in 2027 for colon cancer screening purposes.  Otherwise follow-up as needed.  At [facility] we value your feedback. You may receive a survey about your visit today. Please share your experience as we strive to create trusting relationships with our patients to provide genuine, compassionate, quality care.  We appreciate your understanding and patience as we review any laboratory studies, imaging, and other diagnostic tests that are ordered as we care for you. Our office policy is 5 business days for review of these results, and any emergent or urgent results are addressed in a timely manner for your best interest. If you do not hear from our office in 1 week, please contact us.   We also encourage the use of MyChart, which contains your medical information for your review as well. If you are not enrolled in this feature, an access code is on this after visit summary for your convenience. Thank you for allowing [place] to be involved in your care.  It was great to see you today!  I hope you have a great rest of your summer!!    Joshua Hall. Joshua Hall, D.O. Gastroenterology and Hepatology [facility]

## 2020-08-26 ENCOUNTER — Telehealth: Payer: Self-pay

## 2020-08-26 NOTE — Telephone Encounter (Signed)
Pt called office and LMOVM asking about his surgery referral.  Referral was sent to [facility] last week. Per Proficient referral is in review.  Called and informed pt referral is in review. Gave him phone number to [facility] if he wants to go ahead and schedule an appt.

## 2020-10-05 ENCOUNTER — Ambulatory Visit: Payer: Medicare HMO | Admitting: Gastroenterology

## 2020-10-31 DIAGNOSIS — Z006 Encounter for examination for normal comparison and control in clinical research program: Secondary | ICD-10-CM

## 2020-10-31 NOTE — Research (Signed)
Multiple attempts made to contact patient about missed visit with ORION 4 with no response. Certified letter sent out today

## 2021-04-11 ENCOUNTER — Ambulatory Visit: Payer: Medicare HMO | Admitting: Cardiovascular Disease

## 2021-05-16 ENCOUNTER — Ambulatory Visit: Payer: Medicare HMO | Admitting: Cardiovascular Disease

## 2021-06-06 ENCOUNTER — Encounter: Payer: Self-pay | Admitting: Cardiovascular Disease

## 2021-06-06 ENCOUNTER — Ambulatory Visit (INDEPENDENT_AMBULATORY_CARE_PROVIDER_SITE_OTHER): Payer: Medicare HMO | Admitting: Cardiovascular Disease

## 2021-06-06 VITALS — BP 160/82 | HR 59 | Ht 72.0 in | Wt 197.0 lb

## 2021-06-06 DIAGNOSIS — I10 Essential (primary) hypertension: Secondary | ICD-10-CM | POA: Diagnosis not present

## 2021-06-06 DIAGNOSIS — I739 Peripheral vascular disease, unspecified: Secondary | ICD-10-CM

## 2021-06-06 DIAGNOSIS — E785 Hyperlipidemia, unspecified: Secondary | ICD-10-CM | POA: Diagnosis not present

## 2021-06-06 DIAGNOSIS — I25118 Atherosclerotic heart disease of native coronary artery with other forms of angina pectoris: Secondary | ICD-10-CM

## 2021-06-06 MED ORDER — ROSUVASTATIN CALCIUM 20 MG PO TABS: 20.0000 mg | ORAL_TABLET | Freq: Every day | ORAL | 3 refills | Status: AC

## 2021-06-06 MED ORDER — LOSARTAN POTASSIUM 50 MG PO TABS: 50.0000 mg | ORAL_TABLET | Freq: Every day | ORAL | 3 refills | Status: DC

## 2021-06-06 MED ORDER — NITROGLYCERIN 0.4 MG SL SUBL: 0.4000 mg | SUBLINGUAL_TABLET | SUBLINGUAL | 3 refills | Status: AC | PRN

## 2021-06-06 NOTE — Patient Instructions (Addendum)
Medication Instructions:  ?STOP: CILOSTAZOL  ? ?OTHER CARDIAC MEDICATIONS REFILLED  ? ?*If you need a refill on your cardiac medications before your next appointment, please call your pharmacy* ? ?Follow-Up: ?At [facility] you and your health needs are our priority.  As part of our continuing mission to provide you with exceptional heart care, we have created designated Provider Care Teams.  These Care Teams include your primary Cardiologist (physician) and Advanced Practice Providers (APPs -  Physician Assistants and Nurse Practitioners) who all work together to provide you with the care you need, when you need it. ? ?We recommend signing up for the patient portal called "MyChart".  Sign up information is provided on this After Visit Summary.  MyChart is used to connect with patients for Virtual Visits (Telemedicine).  Patients are able to view lab/test results, encounter notes, upcoming appointments, etc.  Non-urgent messages can be sent to your provider as well.   ?To learn more about what you can do with MyChart, go to [email or web address].   ? ?Your next appointment:   ?6 month(s) ? ?The format for your next appointment:   ?In Person ? ?Provider:   ?Kathlyn Sacramento, MD   ? ? ? ? ? ?  ?

## 2021-06-06 NOTE — Progress Notes (Signed)
?  ?Cardiology Office Note ? ? ?Date:  06/06/2021  ? ?ID:  Joshua Hall, DOB [birth year], MRN [id number] ? ?PCP:  Celene Squibb, MD  ?Cardiologist:   Kathlyn Sacramento, MD  ? ?No chief complaint on file. ? ? ?  ?History of Present Illness: ?Joshua Hall is a 66 y.o. male who presents for a follow-up visit a follow-up visit regarding peripheral arterial disease and coronary artery disease. ? ?He has chronic medical conditions that include previous tobacco use, hypertension and hyperlipidemia.  ?He had unstable angina in March of 2019. Cardiac catheterization showed 90% distal LAD stenosis close to the apex, occluded second diagonal and no other obstructive disease.  LVEDP was normal and ejection fraction was 55 to 60%.  The distal LAD was too small to stent and I have recommended medical therapy. ?He is status post femoral to above-the-knee popliteal artery bypass in August, 2019 for severe claudication and recurrent SFA occlusion.  However, he is known to have an occluded left femoral-popliteal bypass.   ?He has known history of poor follow-up and compliance with medications.  He missed his follow-up appointments and out of his medications.  He has occasional chest pain that is usually brief.  No shortness of breath.  He reports stable left calf claudication that is not lifestyle limiting.  He continues to hunt for a hobby. ? ? ? ?Past Medical History:  ?Diagnosis Date  ? Colitis   ? COPD (chronic obstructive pulmonary disease) ([place])   ? Coronary atherosclerosis of native coronary artery   ? a. cath in 2013 showing nonobstructive disease along the LAD and RCA with 99% stenosis of D2 --> too small for intervention, medical therapy recommended b. normal NST in 10/2013 c. 04/2017 cath showing 90% distal-LAD stenosis and occluded D2 with medical management recommended  ? Hyperlipidemia   ? Hypertension   ? Myocardial infarction [facility]) ?2013; ?2015  ? PAD (peripheral artery disease) ([place])   ? a. s/p stent placement to  the left SFA in 2015  ? Tobacco use   ? ? ?Past Surgical History:  ?Procedure Laterality Date  ? ABDOMINAL AORTAGRAM N/A 12/23/2013  ? Procedure: ABDOMINAL AORTAGRAM;  Surgeon: [name], MD;  Location: [facility] CATH LAB;  Service: Cardiovascular;  Laterality: N/A;  ? ABDOMINAL AORTOGRAM N/A 09/18/2017  ? Procedure: ABDOMINAL AORTOGRAM;  Surgeon: [name], MD;  Location: [place] CV LAB;  Service: Cardiovascular;  Laterality: N/A;  ? ABDOMINAL AORTOGRAM W/LOWER EXTREMITY Left 03/25/2019  ? Procedure: ABDOMINAL AORTOGRAM W/LOWER EXTREMITY;  Surgeon: [name], MD;  Location: [place] CV LAB;  Service: Cardiovascular;  Laterality: Left;  ? CARDIAC CATHETERIZATION  10/2011  ? [place]  ? COLONOSCOPY N/A 03/16/2015  ? SLF: 1. No source for abdominal pain identified. 2. six colorectal polyps removed. hyperplastic. 3. the colon was redundant 4. mild diverticulosis noted in the sigmoid colon 5. small internal hemorroids.   ? ESOPHAGOGASTRODUODENOSCOPY N/A 03/16/2015  ? SLF: 1. No source for abdominal pain identified. 2. mild non-erosive gastritis and duodentitis.   ? FEMORAL-POPLITEAL BYPASS GRAFT Left 10/08/2017  ? Procedure: Left  FEMORAL-POPLITEAL ARTERY Bypass Graft;  Surgeon: Waynetta Sandy, MD;  Location: [place];  Service: Vascular;  Laterality: Left;  ? HERNIA REPAIR    ? INSERTION OF MESH N/A 04/13/2015  ? Procedure: INSERTION OF MESH;  Surgeon: [name], MD;  Location: [place];  Service: General;  Laterality: N/A;  ? LEFT HEART CATH AND CORONARY ANGIOGRAPHY  N/A 05/01/2017  ? Procedure: LEFT HEART CATH AND CORONARY ANGIOGRAPHY;  Surgeon: [name], MD;  Location: [place] CV LAB;  Service: Cardiovascular;  Laterality: N/A;  ? LEFT HEART CATHETERIZATION WITH CORONARY ANGIOGRAM N/A 10/19/2011  ? Procedure: LEFT HEART CATHETERIZATION WITH CORONARY ANGIOGRAM;  Surgeon: [name], MD;  Location: [facility] CATH LAB;  Service: Cardiovascular;  Laterality:  N/A;  ? LOWER EXTREMITY ANGIOGRAPHY Left 09/18/2017  ? Procedure: Lower Extremity Angiography;  Surgeon: [name], MD;  Location: [place] CV LAB;  Service: Cardiovascular;  Laterality: Left;  ? MOUTH SURGERY    ? "all my teeth removed then cosmetic OR for my dentures to fit"  ? UMBILICAL HERNIA REPAIR N/A 04/13/2015  ? Procedure:  UMBILICAL HERNIA REPAIR;  Surgeon: [name], MD;  Location: [place];  Service: General;  Laterality: N/A;  ? ? ? ?Current Outpatient Medications  ?Medication Sig Dispense Refill  ? albuterol (PROVENTIL HFA;VENTOLIN HFA) 108 (90 BASE) MCG/ACT inhaler Inhale 2 puffs into the lungs every 6 (six) hours as needed for wheezing or shortness of breath. 1 Inhaler 0  ? aspirin 81 MG EC tablet Take 1 tablet (81 mg total) by mouth daily. With food 30 tablet 6  ? EPIPEN 2-PAK 0.3 MG/0.3ML SOAJ injection Inject 0.3 mg as directed as needed for anaphylaxis. Allergic reaction  1  ? pantoprazole (PROTONIX) 20 MG tablet Take 1 tablet (20 mg total) by mouth daily. 30 tablet 0  ? Study - ORION 4 - inclisiran 300 mg/1.19m or placebo SQ injection (PI-Stuckey) Inject 300 mg into the skin every 6 (six) months.    ? losartan (COZAAR) 50 MG tablet Take 1 tablet (50 mg total) by mouth daily. For treatment of high blood pressure and for your heart. 90 tablet 3  ? nitroGLYCERIN (NITROSTAT) 0.4 MG SL tablet Place 1 tablet (0.4 mg total) under the tongue every 5 (five) minutes as needed for chest pain (3 doses MAX). 25 tablet 3  ? rosuvastatin (CRESTOR) 20 MG tablet Take 1 tablet (20 mg total) by mouth daily. 90 tablet 3  ? ?No current facility-administered medications for this visit.  ? ? ?Allergies:   Bee venom  ? ? ?Social History:  The patient  reports that he has been smoking cigarettes. He has a 47.00 pack-year smoking history. He has never used smokeless tobacco. He reports current alcohol use of about 3.0 standard drinks per week. He reports that he does not currently use drugs.   ? ?Family History:  The patient's family history includes Colon cancer in his maternal uncle; Diabetes in his father and mother; Heart disease in his brother; Stroke in his father.  ? ? ?ROS:  Please see the history of present illness.   Otherwise, review of systems are positive for none.   All other systems are reviewed and negative.  ? ? ?PHYSICAL EXAM: ?VS:  BP (!) 160/82   Pulse (!) 59   Ht 6' (1.829 m)   Wt 197 lb (89.4 kg)   SpO2 97%   BMI 26.72 kg/m?  , BMI Body mass index is 26.72 kg/m?. ?GEN: Well nourished, well developed, in no acute distress  ?HEENT: normal  ?Neck: no JVD, carotid bruits, or masses ?Cardiac: RRR; no murmurs, rubs, or gallops,no edema  ?Respiratory:  clear to auscultation bilaterally, normal work of breathing ?GI: soft, nontender, nondistended, + BS ?MS: no deformity or atrophy  ?Skin: warm and dry, no rash ?Neuro:  Strength and sensation are intact ?  Psych: euthymic mood, full affect ? ?EKG:  EKG is ordered today. ?EKG showed normal normal sinus rhythm with no significant ST or T wave changes. ? ? ?Recent Labs: ?08/06/2020: ALT 22; BUN 18; Creatinine, Ser 1.14; Hemoglobin 14.1; Platelets 277; Potassium 4.0; Sodium 139  ? ? ?Lipid Panel ?   ?Component Value Date/Time  ? CHOL 214 (H) 03/17/2019 1031  ? TRIG 120 03/17/2019 1031  ? HDL 72 03/17/2019 1031  ? CHOLHDL 3.0 03/17/2019 1031  ? CHOLHDL 3.1 08/07/2017 0836  ? VLDL 19 08/07/2017 0836  ? LDLCALC 121 (H) 03/17/2019 1031  ? ?  ? ?Wt Readings from Last 3 Encounters:  ?06/06/21 197 lb (89.4 kg)  ?08/18/20 201 lb 9.6 oz (91.4 kg)  ?08/06/20 215 lb (97.5 kg)  ?  ? ? ?   ? View : No data to display.  ?  ?  ?  ? ? ? ? ?ASSESSMENT AND PLAN: ? ?1.  Coronary artery disease involving native coronary arteries with other forms of angina: Distal LAD stenosis being treated medically.  Currently with no chest pain.   ?I discussed with him the importance of compliance with medications.  Continue aspirin 81 mg daily. ?  ?2.  Peripheral arterial  disease: Status post left femoral-popliteal bypass in 2019 which is known to be occluded.  He continues to have stable mild left calf claudication.  He has not been taking cilostazol and reports no difference in

## 2021-07-27 DIAGNOSIS — Z006 Encounter for examination for normal comparison and control in clinical research program: Secondary | ICD-10-CM

## 2021-07-28 NOTE — Research (Signed)
Patient is enrolled in Abrams 4 trial. Have not been able to make contact with patient, but updated the [facility] Ursula Beath) for ORION 4 based on his visit in April with Dr Fletcher Anon.

## 2022-01-15 ENCOUNTER — Telehealth: Payer: Self-pay | Admitting: *Deleted

## 2022-01-15 NOTE — Telephone Encounter (Signed)
Reminder call for orion 4  Patient vm is full couldn't leave a message

## 2022-01-16 MED ORDER — STUDY - ORION 4 - INCLISIRAN 300 MG/1.5 ML OR PLACEBO SQ INJECTION (PI-STUCKEY)
300.0000 mg | INJECTION | SUBCUTANEOUS | Status: DC
  Filled 2022-01-16: qty 1.5

## 2022-04-10 ENCOUNTER — Ambulatory Visit: Payer: Medicare HMO | Admitting: Cardiovascular Disease

## 2022-05-09 ENCOUNTER — Telehealth: Payer: Self-pay | Admitting: *Deleted

## 2022-05-09 NOTE — Telephone Encounter (Signed)
LMTCB for orion 4 study Need to schedule appt for follow up

## 2022-05-29 ENCOUNTER — Ambulatory Visit: Payer: Medicare HMO | Attending: Cardiovascular Disease | Admitting: Cardiovascular Disease

## 2022-05-29 NOTE — Progress Notes (Deleted)
Cardiology Office Note   Date:  05/29/2022   ID:  Joshua Hall, Joshua Hall 1955/04/16, MRN [id number]  PCP:  Benita Stabile, MD  Cardiologist:   Lorine Bears, MD   No chief complaint on file.     History of Present Illness: Joshua Hall is a 67 y.o. male who presents for a follow-up visit a follow-up visit regarding peripheral arterial disease and coronary artery disease.  He has chronic medical conditions that include previous tobacco use, hypertension and hyperlipidemia.  He had unstable angina in March of 2019. Cardiac catheterization showed 90% distal LAD stenosis close to the apex, occluded second diagonal and no other obstructive disease.  LVEDP was normal and ejection fraction was 55 to 60%.  The distal LAD was too small to stent and I have recommended medical therapy. He is status post femoral to above-the-knee popliteal artery bypass in August, 2019 for severe claudication and recurrent SFA occlusion.  However, he is known to have an occluded left femoral-popliteal bypass.   He has known history of poor follow-up and compliance with medications.  He missed his follow-up appointments and out of his medications.  He has occasional chest pain that is usually brief.  No shortness of breath.  He reports stable left calf claudication that is not lifestyle limiting.  He continues to hunt for a hobby.    Past Medical History:  Diagnosis Date   Colitis    COPD (chronic obstructive pulmonary disease) (HCC)    Coronary atherosclerosis of native coronary artery    a. cath in 2013 showing nonobstructive disease along the LAD and RCA with 99% stenosis of D2 --> too small for intervention, medical therapy recommended b. normal NST in 10/2013 c. 04/2017 cath showing 90% distal-LAD stenosis and occluded D2 with medical management recommended   Hyperlipidemia    Hypertension    Myocardial infarction [facility]) ?2013; ?2015   PAD (peripheral artery disease) (HCC)    a. s/p stent placement to  the left SFA in 2015   Tobacco use     Past Surgical History:  Procedure Laterality Date   ABDOMINAL AORTAGRAM N/A 12/23/2013   Procedure: ABDOMINAL Ronny Flurry;  Surgeon: Iran Ouch, MD;  Location: [facility] CATH LAB;  Service: Cardiovascular;  Laterality: N/A;   ABDOMINAL AORTOGRAM N/A 09/18/2017   Procedure: ABDOMINAL AORTOGRAM;  Surgeon: Iran Ouch, MD;  Location: [facility] INVASIVE CV LAB;  Service: Cardiovascular;  Laterality: N/A;   ABDOMINAL AORTOGRAM W/LOWER EXTREMITY Left 03/25/2019   Procedure: ABDOMINAL AORTOGRAM W/LOWER EXTREMITY;  Surgeon: Iran Ouch, MD;  Location: [facility] INVASIVE CV LAB;  Service: Cardiovascular;  Laterality: Left;   CARDIAC CATHETERIZATION  10/2011   [place] -    COLONOSCOPY N/A 03/16/2015   SLF: 1. No source for abdominal pain identified. 2. six colorectal polyps removed. hyperplastic. 3. the colon was redundant 4. mild diverticulosis noted in the sigmoid colon 5. small internal hemorroids.    ESOPHAGOGASTRODUODENOSCOPY N/A 03/16/2015   SLF: 1. No source for abdominal pain identified. 2. mild non-erosive gastritis and duodentitis.    FEMORAL-POPLITEAL BYPASS GRAFT Left 10/08/2017   Procedure: Left  FEMORAL-POPLITEAL ARTERY Bypass Graft;  Surgeon: Maeola Harman, MD;  Location: [facility] OR;  Service: Vascular;  Laterality: Left;   HERNIA REPAIR     INSERTION OF MESH N/A 04/13/2015   Procedure: INSERTION OF MESH;  Surgeon: Harriette Bouillon, MD;  Location: [facility];  Service: General;  Laterality: N/A;   LEFT HEART CATH AND CORONARY ANGIOGRAPHY  N/A 05/01/2017   Procedure: LEFT HEART CATH AND CORONARY ANGIOGRAPHY;  Surgeon: Iran Ouch, MD;  Location: [facility] INVASIVE CV LAB;  Service: Cardiovascular;  Laterality: N/A;   LEFT HEART CATHETERIZATION WITH CORONARY ANGIOGRAM N/A 10/19/2011   Procedure: LEFT HEART CATHETERIZATION WITH CORONARY ANGIOGRAM;  Surgeon: Peter M Swaziland, MD;  Location: [facility] CATH LAB;  Service: Cardiovascular;  Laterality:  N/A;   LOWER EXTREMITY ANGIOGRAPHY Left 09/18/2017   Procedure: Lower Extremity Angiography;  Surgeon: Iran Ouch, MD;  Location: [facility] INVASIVE CV LAB;  Service: Cardiovascular;  Laterality: Left;   MOUTH SURGERY     "all my teeth removed then cosmetic OR for my dentures to fit"   UMBILICAL HERNIA REPAIR N/A 04/13/2015   Procedure:  UMBILICAL HERNIA REPAIR;  Surgeon: Harriette Bouillon, MD;  Location: [facility];  Service: General;  Laterality: N/A;     Current Outpatient Medications  Medication Sig Dispense Refill   albuterol (PROVENTIL HFA;VENTOLIN HFA) 108 (90 BASE) MCG/ACT inhaler Inhale 2 puffs into the lungs every 6 (six) hours as needed for wheezing or shortness of breath. 1 Inhaler 0   aspirin 81 MG EC tablet Take 1 tablet (81 mg total) by mouth daily. With food 30 tablet 6   EPIPEN 2-PAK 0.3 MG/0.3ML SOAJ injection Inject 0.3 mg as directed as needed for anaphylaxis. Allergic reaction  1   losartan (COZAAR) 50 MG tablet Take 1 tablet (50 mg total) by mouth daily. For treatment of high blood pressure and for your heart. 90 tablet 3   nitroGLYCERIN (NITROSTAT) 0.4 MG SL tablet Place 1 tablet (0.4 mg total) under the tongue every 5 (five) minutes as needed for chest pain (3 doses MAX). 25 tablet 3   pantoprazole (PROTONIX) 20 MG tablet Take 1 tablet (20 mg total) by mouth daily. 30 tablet 0   rosuvastatin (CRESTOR) 20 MG tablet Take 1 tablet (20 mg total) by mouth daily. 90 tablet 3   Study - ORION 4 - inclisiran 300 mg/1.36mL or placebo SQ injection (PI-Stuckey) Inject 300 mg into the skin every 6 (six) months.     Current Facility-Administered Medications  Medication Dose Route Frequency Provider Last Rate Last Admin   Study - ORION 4 - inclisiran 300 mg/1.73mL or placebo SQ injection (PI-Stuckey)  300 mg Subcutaneous Q6 months Herby Abraham, MD        Allergies:   Bee venom    Social History:  The patient  reports that he has been smoking cigarettes. He has a 47.00  pack-year smoking history. He has never used smokeless tobacco. He reports current alcohol use of about 3.0 standard drinks of alcohol per week. He reports that he does not currently use drugs.   Family History:  The patient's family history includes Colon cancer in his maternal uncle; Diabetes in his father and mother; Heart disease in his brother; Stroke in his father.    ROS:  Please see the history of present illness.   Otherwise, review of systems are positive for none.   All other systems are reviewed and negative.    PHYSICAL EXAM: VS:  There were no vitals taken for this visit. , BMI There is no height or weight on file to calculate BMI. GEN: Well nourished, well developed, in no acute distress  HEENT: normal  Neck: no JVD, carotid bruits, or masses Cardiac: RRR; no murmurs, rubs, or gallops,no edema  Respiratory:  clear to auscultation bilaterally, normal work of breathing GI: soft, nontender, nondistended, + BS  MS: no deformity or atrophy  Skin: warm and dry, no rash Neuro:  Strength and sensation are intact Psych: euthymic mood, full affect  EKG:  EKG is ordered today. EKG showed normal normal sinus rhythm with no significant ST or T wave changes.   Recent Labs: No results found for requested labs within last 365 days.    Lipid Panel    Component Value Date/Time   CHOL 214 (H) 03/17/2019 1031   TRIG 120 03/17/2019 1031   HDL 72 03/17/2019 1031   CHOLHDL 3.0 03/17/2019 1031   CHOLHDL 3.1 08/07/2017 0836   VLDL 19 08/07/2017 0836   LDLCALC 121 (H) 03/17/2019 1031      Wt Readings from Last 3 Encounters:  06/06/21 197 lb (89.4 kg)  08/18/20 201 lb 9.6 oz (91.4 kg)  08/06/20 215 lb (97.5 kg)          No data to display            ASSESSMENT AND PLAN:  1.  Coronary artery disease involving native coronary arteries with other forms of angina: Distal LAD stenosis being treated medically.  Currently with no chest pain.   I discussed with him the  importance of compliance with medications.  Continue aspirin 81 mg daily.   2.  Peripheral arterial disease: Status post left femoral-popliteal bypass in 2019 which is known to be occluded.  He continues to have stable mild left calf claudication.  He has not been taking cilostazol and reports no difference in symptoms whether he takes it or not.  I discontinued cilostazol today.    3.    Intermittent tobacco use: Discussed with him the importance of complete abstinence.   4.  Essential hypertension: He has been out of losartan over the last few months.  I refilled the medication   5.   Hyperlipidemia: He also ran out of rosuvastatin which was refilled today.  We will plan on doing labs upon follow-up if not done by his primary care physician.    Disposition:   FU with with me In 6 months  Signed,  Lorine Bears, MD  05/29/2022 8:36 AM    [facility]

## 2022-06-11 ENCOUNTER — Telehealth: Payer: Self-pay | Admitting: *Deleted

## 2022-06-11 NOTE — Telephone Encounter (Signed)
Tried to reach out to patient about Orion 4 study  Someone picked up phone and hung up will call at later date

## 2022-06-25 ENCOUNTER — Other Ambulatory Visit (HOSPITAL_COMMUNITY): Payer: Self-pay | Admitting: Internal Medicine

## 2022-06-25 DIAGNOSIS — Z9889 Other specified postprocedural states: Secondary | ICD-10-CM

## 2022-06-25 DIAGNOSIS — E785 Hyperlipidemia, unspecified: Secondary | ICD-10-CM | POA: Diagnosis not present

## 2022-06-25 DIAGNOSIS — F17200 Nicotine dependence, unspecified, uncomplicated: Secondary | ICD-10-CM

## 2022-06-25 DIAGNOSIS — R7301 Impaired fasting glucose: Secondary | ICD-10-CM | POA: Diagnosis not present

## 2022-06-25 DIAGNOSIS — J449 Chronic obstructive pulmonary disease, unspecified: Secondary | ICD-10-CM | POA: Diagnosis not present

## 2022-06-25 DIAGNOSIS — E559 Vitamin D deficiency, unspecified: Secondary | ICD-10-CM | POA: Diagnosis not present

## 2022-06-25 DIAGNOSIS — F17210 Nicotine dependence, cigarettes, uncomplicated: Secondary | ICD-10-CM | POA: Diagnosis not present

## 2022-06-25 DIAGNOSIS — K219 Gastro-esophageal reflux disease without esophagitis: Secondary | ICD-10-CM | POA: Diagnosis not present

## 2022-06-25 DIAGNOSIS — I739 Peripheral vascular disease, unspecified: Secondary | ICD-10-CM | POA: Diagnosis not present

## 2022-06-25 DIAGNOSIS — I10 Essential (primary) hypertension: Secondary | ICD-10-CM | POA: Diagnosis not present

## 2022-06-25 DIAGNOSIS — I2510 Atherosclerotic heart disease of native coronary artery without angina pectoris: Secondary | ICD-10-CM | POA: Diagnosis not present

## 2022-07-18 ENCOUNTER — Telehealth: Payer: Self-pay | Admitting: *Deleted

## 2022-07-18 NOTE — Telephone Encounter (Signed)
Called patient to scheduled his visit or do a phone call visit with patient  Call went to voice mail and mail box was full at this time  Mercer Pod :) RN BSN  Clinical Research Nurse  Be strong and take heart, all you who hope in the Lord. ~ Psalm 31:24

## 2022-07-19 ENCOUNTER — Ambulatory Visit (HOSPITAL_COMMUNITY)
Admission: RE | Admit: 2022-07-19 | Discharge: 2022-07-19 | Disposition: A | Discharge status: Home / Self Care | Payer: Medicare HMO | Source: Ambulatory Visit | Attending: Internal Medicine | Admitting: Internal Medicine

## 2022-07-19 DIAGNOSIS — F17210 Nicotine dependence, cigarettes, uncomplicated: Secondary | ICD-10-CM | POA: Diagnosis not present

## 2022-07-19 DIAGNOSIS — F17200 Nicotine dependence, unspecified, uncomplicated: Secondary | ICD-10-CM | POA: Diagnosis not present

## 2022-07-19 DIAGNOSIS — Z9889 Other specified postprocedural states: Secondary | ICD-10-CM | POA: Diagnosis not present

## 2022-07-19 DIAGNOSIS — Z7182 Exercise counseling: Secondary | ICD-10-CM | POA: Diagnosis not present

## 2022-07-19 DIAGNOSIS — R109 Unspecified abdominal pain: Secondary | ICD-10-CM | POA: Diagnosis not present

## 2022-07-19 DIAGNOSIS — I10 Essential (primary) hypertension: Secondary | ICD-10-CM | POA: Diagnosis not present

## 2022-07-19 DIAGNOSIS — Z713 Dietary counseling and surveillance: Secondary | ICD-10-CM | POA: Diagnosis not present

## 2022-07-19 DIAGNOSIS — Z79899 Other long term (current) drug therapy: Secondary | ICD-10-CM | POA: Diagnosis not present

## 2022-08-09 ENCOUNTER — Telehealth: Payer: Self-pay | Admitting: *Deleted

## 2022-08-09 NOTE — Telephone Encounter (Signed)
Called patient to touch base with him for Orion 4 study  He answered phone  He said he wanted to continue in study I set him up an appt for 08/21/2022   Mercer Pod :) RN BSN  Clinical Research Nurse  Be strong and take heart, all you who hope in the Lord. ~ Psalm 31:24

## 2022-08-13 ENCOUNTER — Encounter (HOSPITAL_COMMUNITY): Payer: Self-pay

## 2022-08-13 ENCOUNTER — Ambulatory Visit (HOSPITAL_COMMUNITY): Admission: RE | Admit: 2022-08-13 | Payer: Medicare HMO | Source: Ambulatory Visit

## 2022-08-30 ENCOUNTER — Encounter: Payer: Medicare HMO | Admitting: *Deleted

## 2022-08-30 MED ORDER — STUDY - ORION 4 - INCLISIRAN 300 MG/1.5 ML OR PLACEBO SQ INJECTION (PI-STUCKEY)
300.0000 mg | INJECTION | SUBCUTANEOUS | Status: DC
  Filled 2022-08-30: qty 1.5

## 2022-08-30 NOTE — Research (Signed)
Error

## 2022-09-04 ENCOUNTER — Encounter: Payer: Self-pay | Admitting: Cardiovascular Disease

## 2022-09-17 ENCOUNTER — Other Ambulatory Visit (HOSPITAL_COMMUNITY): Payer: Self-pay | Admitting: Family Medicine

## 2022-09-17 DIAGNOSIS — R109 Unspecified abdominal pain: Secondary | ICD-10-CM

## 2022-09-17 DIAGNOSIS — R319 Hematuria, unspecified: Secondary | ICD-10-CM | POA: Diagnosis not present

## 2022-09-17 DIAGNOSIS — R1030 Lower abdominal pain, unspecified: Secondary | ICD-10-CM | POA: Diagnosis not present

## 2022-09-21 ENCOUNTER — Ambulatory Visit (HOSPITAL_COMMUNITY)
Admission: RE | Admit: 2022-09-21 | Discharge: 2022-09-21 | Disposition: A | Discharge status: Home / Self Care | Payer: Medicare HMO | Source: Ambulatory Visit | Attending: Family Medicine | Admitting: Family Medicine

## 2022-09-21 DIAGNOSIS — K7689 Other specified diseases of liver: Secondary | ICD-10-CM | POA: Diagnosis not present

## 2022-09-21 DIAGNOSIS — R109 Unspecified abdominal pain: Secondary | ICD-10-CM | POA: Insufficient documentation

## 2022-09-21 DIAGNOSIS — K5730 Diverticulosis of large intestine without perforation or abscess without bleeding: Secondary | ICD-10-CM | POA: Diagnosis not present

## 2022-09-21 MED ORDER — IOHEXOL 350 MG/ML SOLN
75.0000 mL | Freq: Once | INTRAVENOUS | Status: AC | PRN
  Administered 2022-09-21: 75 mL via INTRAVENOUS

## 2022-10-14 ENCOUNTER — Encounter (HOSPITAL_COMMUNITY): Payer: Self-pay | Admitting: Emergency Medicine

## 2022-10-14 ENCOUNTER — Emergency Department (HOSPITAL_COMMUNITY)
Admission: EM | Admit: 2022-10-14 | Discharge: 2022-10-15 | Disposition: A | Discharge status: Home / Self Care | Payer: Medicare HMO | Attending: Emergency Medicine | Admitting: Emergency Medicine

## 2022-10-14 ENCOUNTER — Other Ambulatory Visit: Payer: Self-pay

## 2022-10-14 DIAGNOSIS — M542 Cervicalgia: Secondary | ICD-10-CM | POA: Diagnosis present

## 2022-10-14 DIAGNOSIS — I10 Essential (primary) hypertension: Secondary | ICD-10-CM | POA: Insufficient documentation

## 2022-10-14 DIAGNOSIS — Z79899 Other long term (current) drug therapy: Secondary | ICD-10-CM | POA: Insufficient documentation

## 2022-10-14 DIAGNOSIS — J029 Acute pharyngitis, unspecified: Secondary | ICD-10-CM | POA: Diagnosis not present

## 2022-10-14 DIAGNOSIS — Z7982 Long term (current) use of aspirin: Secondary | ICD-10-CM | POA: Diagnosis not present

## 2022-10-14 LAB — GROUP A STREP BY PCR: Group A Strep by PCR: NOT DETECTED

## 2022-10-14 MED ORDER — LIDOCAINE VISCOUS HCL 2 % MT SOLN
15.0000 mL | Freq: Once | OROMUCOSAL | Status: AC
  Administered 2022-10-14: 15 mL via ORAL
  Filled 2022-10-14: qty 15

## 2022-10-14 MED ORDER — ALUM & MAG HYDROXIDE-SIMETH 200-200-20 MG/5ML PO SUSP
30.0000 mL | Freq: Once | ORAL | Status: AC
  Administered 2022-10-14: 30 mL via ORAL
  Filled 2022-10-14: qty 30

## 2022-10-14 NOTE — ED Triage Notes (Signed)
Pt c/o feels like something is stuck in his throat since about 3pm. Pt states it feels like the pain is on the right side of his throat and pain goes up to his ear. Pt states he has been able to drink fluids since this started and it just hurts when he drinks anything.

## 2022-10-14 NOTE — ED Notes (Signed)
Introduced self to pt Pt stated that at 1530 he was outside drinking a beer in the shade when he felt like something got stuck in his throat. Pt complains of pain from the RIGHT side of neck that radiates up to the RIGHT ear.   Pt able to swallow without difficulty Denies recent fever or cough.   Waiting for provider eval and further orders.

## 2022-10-14 NOTE — ED Notes (Signed)
Pt now complains of difficulty swallowing

## 2022-10-14 NOTE — ED Provider Notes (Signed)
[place] EMERGENCY DEPARTMENT AT [facility] Provider Note   CSN: [id number] Arrival date & time: 10/14/22  1920     History  Chief Complaint  Patient presents with   Sore Throat    Joshua Hall is a 67 y.o. male.  Patient to ED with pain in the right side of his neck that started around 3:30 while drinking a beer. He states it felt as if something was in the beer that hurt when he swallowed, and has been sore ever since. No difficulty swallowing. No vomiting. No bloody saliva.   The history is provided by the patient. No language interpreter was used.  Sore Throat       Home Medications Prior to Admission medications   Medication Sig Start Date End Date Taking? Authorizing Provider  albuterol (PROVENTIL HFA;VENTOLIN HFA) 108 (90 BASE) MCG/ACT inhaler Inhale 2 puffs into the lungs every 6 (six) hours as needed for wheezing or shortness of breath. 01/21/12   Erick Blinks, MD  aspirin 81 MG EC tablet Take 1 tablet (81 mg total) by mouth daily. With food 12/12/17   Shon Hale, MD  EPIPEN 2-PAK 0.3 MG/0.3ML SOAJ injection Inject 0.3 mg as directed as needed for anaphylaxis. Allergic reaction 10/08/14   [provider]  losartan (COZAAR) 50 MG tablet Take 1 tablet (50 mg total) by mouth daily. For treatment of high blood pressure and for your heart. 06/06/21   Iran Ouch, MD  nitroGLYCERIN (NITROSTAT) 0.4 MG SL tablet Place 1 tablet (0.4 mg total) under the tongue every 5 (five) minutes as needed for chest pain (3 doses MAX). 06/06/21   Iran Ouch, MD  pantoprazole (PROTONIX) 20 MG tablet Take 1 tablet (20 mg total) by mouth daily. 10/15/18   Eber Hong, MD  rosuvastatin (CRESTOR) 20 MG tablet Take 1 tablet (20 mg total) by mouth daily. 06/06/21   Iran Ouch, MD  Study - ORION 4 - inclisiran 300 mg/1.34mL or placebo SQ injection (PI-Stuckey) Inject 300 mg into the skin every 6 (six) months. 01/22/18   [provider]       Allergies    Bee venom    Review of Systems   Review of Systems  Physical Exam Updated Vital Signs BP (!) 181/116 (BP Location: Right Arm)   Pulse 69   Temp 98.5 F (36.9 C)   Resp (!) 21   Ht 6' (1.829 m)   Wt 89.4 kg   SpO2 96%   BMI 26.72 kg/m  Physical Exam Vitals and nursing note reviewed.  Constitutional:      General: He is not in acute distress.    Appearance: He is well-developed.  HENT:     Mouth/Throat:     Mouth: Mucous membranes are moist. No oral lesions.     Pharynx: Uvula midline. No pharyngeal swelling or posterior oropharyngeal erythema.  Neck:     Thyroid: No thyromegaly.     Vascular: No carotid bruit.   Musculoskeletal:     Cervical back: Normal range of motion and neck supple.  Lymphadenopathy:     Cervical:     Right cervical: No superficial cervical adenopathy.    Left cervical: No superficial cervical adenopathy.  Neurological:     Mental Status: He is alert.     ED Results / Procedures / Treatments   Labs (all labs ordered are listed, but only abnormal results are displayed) Labs Reviewed  GROUP A STREP BY PCR    EKG  None  Radiology No results found.  Procedures Procedures    Medications Ordered in ED Medications  alum & mag hydroxide-simeth (MAALOX/MYLANTA) 200-200-20 MG/5ML suspension 30 mL (30 mLs Oral Given 10/14/22 2316)    And  lidocaine (XYLOCAINE) 2 % viscous mouth solution 15 mL (15 mLs Oral Given 10/14/22 2316)    ED Course/ Medical Decision Making/ A&P Clinical Course as of 10/15/22 0003  Sun Oct 14, 2022  2336 Patient with painful swallowing since 3:30 while drinking something and felt he swallowed a FB. Exam unremarkable. Slightly hypertensive but reports he has not taken his blood pressure medication today. No chest pain, SOB. No headache.  [SU]  2356 GI cocktail with lidocaine provided with moderate relief of symptoms. He appears well. Sleeping on recheck. He is felt appropriate for discharge home.  [SU]     Clinical Course User Index [SU] Elpidio Anis, PA-C                                 Medical Decision Making Risk OTC drugs. Prescription drug management.           Final Clinical Impression(s) / ED Diagnoses Final diagnoses:  Sore throat    Rx / DC Orders ED Discharge Orders     None         Elpidio Anis, PA-C 10/15/22 0003    Vanetta Mulders, MD 10/16/22 [phone]

## 2022-10-14 NOTE — Discharge Instructions (Addendum)
Recommend using honey or lozenges to coat the throat until your pain is better. Follow up with your doctor for recheck if pain continues.

## 2022-12-20 DIAGNOSIS — E785 Hyperlipidemia, unspecified: Secondary | ICD-10-CM | POA: Diagnosis not present

## 2022-12-20 DIAGNOSIS — E559 Vitamin D deficiency, unspecified: Secondary | ICD-10-CM | POA: Diagnosis not present

## 2022-12-20 DIAGNOSIS — R7301 Impaired fasting glucose: Secondary | ICD-10-CM | POA: Diagnosis not present

## 2022-12-20 DIAGNOSIS — Z125 Encounter for screening for malignant neoplasm of prostate: Secondary | ICD-10-CM | POA: Diagnosis not present

## 2022-12-20 DIAGNOSIS — Z23 Encounter for immunization: Secondary | ICD-10-CM | POA: Diagnosis not present

## 2022-12-20 DIAGNOSIS — I10 Essential (primary) hypertension: Secondary | ICD-10-CM | POA: Diagnosis not present

## 2023-01-03 DIAGNOSIS — I119 Hypertensive heart disease without heart failure: Secondary | ICD-10-CM | POA: Diagnosis not present

## 2023-01-03 DIAGNOSIS — I10 Essential (primary) hypertension: Secondary | ICD-10-CM | POA: Diagnosis not present

## 2023-01-03 DIAGNOSIS — E785 Hyperlipidemia, unspecified: Secondary | ICD-10-CM | POA: Diagnosis not present

## 2023-01-03 DIAGNOSIS — E559 Vitamin D deficiency, unspecified: Secondary | ICD-10-CM | POA: Diagnosis not present

## 2023-01-03 DIAGNOSIS — I2510 Atherosclerotic heart disease of native coronary artery without angina pectoris: Secondary | ICD-10-CM | POA: Diagnosis not present

## 2023-01-03 DIAGNOSIS — R7301 Impaired fasting glucose: Secondary | ICD-10-CM | POA: Diagnosis not present

## 2023-01-03 DIAGNOSIS — I739 Peripheral vascular disease, unspecified: Secondary | ICD-10-CM | POA: Diagnosis not present

## 2023-01-03 DIAGNOSIS — F17200 Nicotine dependence, unspecified, uncomplicated: Secondary | ICD-10-CM | POA: Diagnosis not present

## 2023-01-03 DIAGNOSIS — Z9889 Other specified postprocedural states: Secondary | ICD-10-CM | POA: Diagnosis not present

## 2023-02-18 ENCOUNTER — Encounter: Payer: Self-pay | Admitting: *Deleted

## 2023-02-18 DIAGNOSIS — Z006 Encounter for examination for normal comparison and control in clinical research program: Secondary | ICD-10-CM

## 2023-02-18 NOTE — Research (Signed)
 Spoke with patient over phone  No changes in meds per patient  No hospitalizations Patient not ready to come in for visit - he said maybe this year I told him I would call him in 6 months to follow up Patient states his meds are the same

## 2023-04-26 DIAGNOSIS — I10 Essential (primary) hypertension: Secondary | ICD-10-CM | POA: Diagnosis not present

## 2023-04-26 DIAGNOSIS — Z125 Encounter for screening for malignant neoplasm of prostate: Secondary | ICD-10-CM | POA: Diagnosis not present

## 2023-04-26 DIAGNOSIS — E785 Hyperlipidemia, unspecified: Secondary | ICD-10-CM | POA: Diagnosis not present

## 2023-04-26 DIAGNOSIS — R7301 Impaired fasting glucose: Secondary | ICD-10-CM | POA: Diagnosis not present

## 2023-04-26 DIAGNOSIS — E559 Vitamin D deficiency, unspecified: Secondary | ICD-10-CM | POA: Diagnosis not present

## 2023-05-03 DIAGNOSIS — F17210 Nicotine dependence, cigarettes, uncomplicated: Secondary | ICD-10-CM | POA: Diagnosis not present

## 2023-05-03 DIAGNOSIS — J449 Chronic obstructive pulmonary disease, unspecified: Secondary | ICD-10-CM | POA: Diagnosis not present

## 2023-05-03 DIAGNOSIS — E559 Vitamin D deficiency, unspecified: Secondary | ICD-10-CM | POA: Diagnosis not present

## 2023-05-03 DIAGNOSIS — I2510 Atherosclerotic heart disease of native coronary artery without angina pectoris: Secondary | ICD-10-CM | POA: Diagnosis not present

## 2023-05-03 DIAGNOSIS — R7301 Impaired fasting glucose: Secondary | ICD-10-CM | POA: Diagnosis not present

## 2023-05-03 DIAGNOSIS — I739 Peripheral vascular disease, unspecified: Secondary | ICD-10-CM | POA: Diagnosis not present

## 2023-05-03 DIAGNOSIS — E785 Hyperlipidemia, unspecified: Secondary | ICD-10-CM | POA: Diagnosis not present

## 2023-05-03 DIAGNOSIS — Z9889 Other specified postprocedural states: Secondary | ICD-10-CM | POA: Diagnosis not present

## 2023-05-03 DIAGNOSIS — I10 Essential (primary) hypertension: Secondary | ICD-10-CM | POA: Diagnosis not present

## 2023-05-08 ENCOUNTER — Telehealth: Payer: Self-pay

## 2023-05-08 ENCOUNTER — Other Ambulatory Visit: Payer: Self-pay | Admitting: Internal Medicine

## 2023-05-08 DIAGNOSIS — E785 Hyperlipidemia, unspecified: Secondary | ICD-10-CM | POA: Insufficient documentation

## 2023-05-08 NOTE — Telephone Encounter (Signed)
 Auth Submission: APPROVED Site of care: Site of care: [facility] Payer: [employer] medicare Medication & CPT/J Code(s) submitted: Leqvio (Inclisiran) [id number] Route of submission (phone, fax, portal): portal Phone # Fax # Auth type: Buy/Bill PB Units/visits requested: 284mg  x 2 doses Reference number: [id number] Approval from: 05/08/23 to 02/12/24

## 2023-05-09 ENCOUNTER — Telehealth: Payer: Self-pay | Admitting: Pharmacy Technician

## 2023-05-09 NOTE — Telephone Encounter (Addendum)
 [facility] has been informed to enroll patient in  the [facility] for Joshua Hall.  APPROVED: Patient has been approved

## 2023-05-10 ENCOUNTER — Encounter: Payer: Self-pay | Admitting: Internal Medicine

## 2023-05-13 ENCOUNTER — Telehealth: Payer: Self-pay

## 2023-05-13 NOTE — Telephone Encounter (Signed)
 Auth Submission: APPROVED Site of care: Site of care: [facility] Payer: [employer] medicare Medication & CPT/J Code(s) submitted: Leqvio (Inclisiran) [id number] Route of submission (phone, fax, portal): portal Phone # Fax # Auth type: Buy/Bill PB Units/visits requested: 284mg  x 2 doses Reference number: [id number] Approval from: 05/08/23 to 02/12/24

## 2023-05-15 ENCOUNTER — Other Ambulatory Visit (HOSPITAL_COMMUNITY): Payer: Self-pay | Admitting: Internal Medicine

## 2023-05-15 ENCOUNTER — Ambulatory Visit

## 2023-05-15 ENCOUNTER — Ambulatory Visit (HOSPITAL_COMMUNITY)
Admission: RE | Admit: 2023-05-15 | Discharge: 2023-05-15 | Disposition: A | Discharge status: Home / Self Care | Source: Ambulatory Visit | Attending: Internal Medicine | Admitting: Internal Medicine

## 2023-05-15 DIAGNOSIS — R10813 Right lower quadrant abdominal tenderness: Secondary | ICD-10-CM | POA: Diagnosis not present

## 2023-05-15 DIAGNOSIS — R109 Unspecified abdominal pain: Secondary | ICD-10-CM | POA: Insufficient documentation

## 2023-05-15 DIAGNOSIS — N281 Cyst of kidney, acquired: Secondary | ICD-10-CM | POA: Diagnosis not present

## 2023-05-15 DIAGNOSIS — R10814 Left lower quadrant abdominal tenderness: Secondary | ICD-10-CM | POA: Diagnosis not present

## 2023-05-15 DIAGNOSIS — R1013 Epigastric pain: Secondary | ICD-10-CM | POA: Diagnosis not present

## 2023-05-16 ENCOUNTER — Other Ambulatory Visit (HOSPITAL_COMMUNITY): Payer: Self-pay | Admitting: Nurse Practitioner

## 2023-05-16 DIAGNOSIS — R109 Unspecified abdominal pain: Secondary | ICD-10-CM

## 2023-05-17 ENCOUNTER — Other Ambulatory Visit (HOSPITAL_COMMUNITY): Payer: Self-pay | Admitting: Nurse Practitioner

## 2023-05-17 DIAGNOSIS — R109 Unspecified abdominal pain: Secondary | ICD-10-CM

## 2023-05-21 ENCOUNTER — Encounter: Attending: Internal Medicine | Admitting: Emergency Medicine

## 2023-05-21 VITALS — BP 155/86 | HR 68 | Temp 98.1°F | Resp 18

## 2023-05-21 DIAGNOSIS — E782 Mixed hyperlipidemia: Secondary | ICD-10-CM | POA: Diagnosis not present

## 2023-05-21 MED ORDER — INCLISIRAN SODIUM 284 MG/1.5ML ~~LOC~~ SOSY
284.0000 mg | PREFILLED_SYRINGE | Freq: Once | SUBCUTANEOUS | Status: AC
  Administered 2023-05-21: 284 mg via SUBCUTANEOUS

## 2023-05-21 NOTE — Progress Notes (Signed)
 Diagnosis: Hyperlipidemia  Provider:  Dwana Melena MD  Procedure: Injection  Leqvio (inclisiran), Dose: 284 mg, Site: subcutaneous, Number of injections: 1  Injection Site(s): Right arm  Post Care: Observation period completed  Discharge: Condition: Good, Destination: Home . AVS Provided  Performed by:  Arrie Senate, RN

## 2023-05-22 ENCOUNTER — Ambulatory Visit (HOSPITAL_COMMUNITY)
Admission: RE | Admit: 2023-05-22 | Discharge: 2023-05-22 | Disposition: A | Discharge status: Home / Self Care | Source: Ambulatory Visit | Attending: Nurse Practitioner | Admitting: Nurse Practitioner

## 2023-05-22 DIAGNOSIS — R109 Unspecified abdominal pain: Secondary | ICD-10-CM | POA: Insufficient documentation

## 2023-05-22 DIAGNOSIS — K5730 Diverticulosis of large intestine without perforation or abscess without bleeding: Secondary | ICD-10-CM | POA: Diagnosis not present

## 2023-05-22 DIAGNOSIS — N281 Cyst of kidney, acquired: Secondary | ICD-10-CM | POA: Diagnosis not present

## 2023-05-22 DIAGNOSIS — K429 Umbilical hernia without obstruction or gangrene: Secondary | ICD-10-CM | POA: Diagnosis not present

## 2023-05-22 MED ORDER — IOHEXOL 300 MG/ML  SOLN
100.0000 mL | Freq: Once | INTRAMUSCULAR | Status: AC | PRN
  Administered 2023-05-22: 100 mL via INTRAVENOUS

## 2023-05-27 ENCOUNTER — Encounter: Payer: Self-pay | Admitting: Internal Medicine

## 2023-06-04 ENCOUNTER — Ambulatory Visit: Admitting: Cardiovascular Disease

## 2023-06-05 ENCOUNTER — Encounter: Payer: Self-pay | Admitting: Gastroenterology

## 2023-06-05 ENCOUNTER — Ambulatory Visit (INDEPENDENT_AMBULATORY_CARE_PROVIDER_SITE_OTHER): Admitting: Gastroenterology

## 2023-06-05 VITALS — BP 170/97 | HR 80 | Temp 98.4°F | Ht 72.0 in | Wt 198.8 lb

## 2023-06-05 DIAGNOSIS — F1090 Alcohol use, unspecified, uncomplicated: Secondary | ICD-10-CM

## 2023-06-05 DIAGNOSIS — R1013 Epigastric pain: Secondary | ICD-10-CM | POA: Diagnosis not present

## 2023-06-05 DIAGNOSIS — K760 Fatty (change of) liver, not elsewhere classified: Secondary | ICD-10-CM

## 2023-06-05 DIAGNOSIS — R1033 Periumbilical pain: Secondary | ICD-10-CM | POA: Insufficient documentation

## 2023-06-05 MED ORDER — PANTOPRAZOLE SODIUM 40 MG PO TBEC: 40.0000 mg | DELAYED_RELEASE_TABLET | Freq: Every day | ORAL | 1 refills | Status: DC

## 2023-06-05 NOTE — H&P (View-Only) (Signed)
 GI Office Note    Referring Provider: Omie Bickers, MD Primary Care Physician:  Omie Bickers, MD  Primary Gastroenterologist: Rolando Cliche. Mordechai April, DO   Chief Complaint   Chief Complaint  Patient presents with   Abdominal Pain    Been having abdominal pain for around 2 mths, states that it feels the same as when he had an umbilical hernia.     History of Present Illness   Joshua Hall is a 68 y.o. male presenting today for abdominal pain. Last seen 08/2020.   Labs 04/2023: white Blood cell count 5000, hemoglobin 14.4, platelets 248,000, glucose 103, creatinine 1.25, albumin 4.2, total bilirubin 0.4, alk phos 83, AST 24, ALT 26, A1c 6, PSA 1, vitamin D 8.9  Today: started having abdominal pain again. First started after eating at seafood restaurant. Thought must be food poisoning. No vomiting or diarrhea. Drank milk and eased off. Hurts everyday, wakes up at night. Pain getting worse. Will get bad and then let off after several minutes. Milk seems to help. Does seem to be worse with pain. Patient does not feel he has lost any weight but says he was told he weight 207 pounds two weeks ago at PCP and he is 198 today. According to available weights, he typically is in the upper 190s. BMs regular. No melena, brbpr. No dysphagia. No dysuria.  Rare NSAIDs.  Patient with known vascular disease. CTA A/P in 2017 with no mesenteric artery stenosis.  Wt Readings from Last 3 Encounters:  06/05/23 198 lb 12.8 oz (90.2 kg)  10/14/22 197 lb (89.4 kg)  06/06/21 197 lb (89.4 kg)    Abd u/s 05/2023: IMPRESSION: 1. Increased liver echotexture consistent with hepatic steatosis. 2. Otherwise unremarkable abdominal ultrasound.  CT A/P with contrast 05/2023: IMPRESSION: 1. No acute intra-abdominal or pelvic pathology. 2. Distal colonic diverticulosis. No bowel obstruction. Normal appendix.  Last colonoscopy 2017 with 6 hyperplastic polyps removed. Recall 2027. No family history of colorectal  malignancy.   Egd 03/2015: mild nonerosive gastrtis and peptic duodenitis. No h.pylori  Medications   Current Outpatient Medications  Medication Sig Dispense Refill   albuterol  (PROVENTIL  HFA;VENTOLIN  HFA) 108 (90 BASE) MCG/ACT inhaler Inhale 2 puffs into the lungs every 6 (six) hours as needed for wheezing or shortness of breath. 1 Inhaler 0   amLODipine  (NORVASC ) 10 MG tablet Take 10 mg by mouth daily.     aspirin  81 MG EC tablet Take 1 tablet (81 mg total) by mouth daily. With food 30 tablet 6   EPIPEN  2-PAK 0.3 MG/0.3ML SOAJ injection Inject 0.3 mg as directed as needed for anaphylaxis. Allergic reaction  1   ezetimibe (ZETIA) 10 MG tablet Take 10 mg by mouth daily.     nitroGLYCERIN  (NITROSTAT ) 0.4 MG SL tablet Place 1 tablet (0.4 mg total) under the tongue every 5 (five) minutes as needed for chest pain (3 doses MAX). 25 tablet 3   pantoprazole  (PROTONIX ) 40 MG tablet Take 40 mg by mouth daily.     rosuvastatin  (CRESTOR ) 20 MG tablet Take 1 tablet (20 mg total) by mouth daily. 90 tablet 3   Study - ORION 4 - inclisiran 300 mg/1.5mL or placebo SQ injection (PI-Stuckey) Inject 300 mg into the skin every 6 (six) months.     telmisartan (MICARDIS) 80 MG tablet Take 80 mg by mouth daily.     traMADol (ULTRAM) 50 MG tablet Take 50 mg by mouth every 6 (six) hours as needed.  Vitamin D, Ergocalciferol, (DRISDOL) 1.25 MG (50000 UNIT) CAPS capsule Take 50,000 Units by mouth once a week.     No current facility-administered medications for this visit.    Allergies   Allergies as of 06/05/2023 - Review Complete 06/05/2023  Allergen Reaction Noted   Bee venom Swelling 11/04/2013     Past Medical History   Past Medical History:  Diagnosis Date   Colitis    COPD (chronic obstructive pulmonary disease) (HCC)    Coronary atherosclerosis of native coronary artery    a. cath in 2013 showing nonobstructive disease along the LAD and RCA with 99% stenosis of D2 --> too small for intervention,  medical therapy recommended b. normal NST in 10/2013 c. 04/2017 cath showing 90% distal-LAD stenosis and occluded D2 with medical management recommended   Hyperlipidemia    Hypertension    Myocardial infarction [facility]) ?2013; ?2015   PAD (peripheral artery disease) (HCC)    a. s/p stent placement to the left SFA in 2015   Tobacco use     Past Surgical History   Past Surgical History:  Procedure Laterality Date   ABDOMINAL AORTAGRAM N/A 12/23/2013   Procedure: ABDOMINAL AORTAGRAM;  Surgeon: Wenona Hamilton, MD;  Location: [facility] CATH LAB;  Service: Cardiovascular;  Laterality: N/A;   ABDOMINAL AORTOGRAM N/A 09/18/2017   Procedure: ABDOMINAL AORTOGRAM;  Surgeon: Wenona Hamilton, MD;  Location: [facility] INVASIVE CV LAB;  Service: Cardiovascular;  Laterality: N/A;   ABDOMINAL AORTOGRAM W/LOWER EXTREMITY Left 03/25/2019   Procedure: ABDOMINAL AORTOGRAM W/LOWER EXTREMITY;  Surgeon: Wenona Hamilton, MD;  Location: [facility] INVASIVE CV LAB;  Service: Cardiovascular;  Laterality: Left;   CARDIAC CATHETERIZATION  10/2011   [place]   COLONOSCOPY N/A 03/16/2015   SLF: 1. No source for abdominal pain identified. 2. six colorectal polyps removed. hyperplastic. 3. the colon was redundant 4. mild diverticulosis noted in the sigmoid colon 5. small internal hemorroids.    ESOPHAGOGASTRODUODENOSCOPY N/A 03/16/2015   SLF: 1. No source for abdominal pain identified. 2. mild non-erosive gastritis and duodentitis.    FEMORAL-POPLITEAL BYPASS GRAFT Left 10/08/2017   Procedure: Left  FEMORAL-POPLITEAL ARTERY Bypass Graft;  Surgeon: Adine Hoof, MD;  Location: [facility] OR;  Service: Vascular;  Laterality: Left;   HERNIA REPAIR     INSERTION OF MESH N/A 04/13/2015   Procedure: INSERTION OF MESH;  Surgeon: Sim Dryer, MD;  Location: [facility];  Service: General;  Laterality: N/A;   LEFT HEART CATH AND CORONARY ANGIOGRAPHY N/A 05/01/2017   Procedure: LEFT HEART CATH AND CORONARY ANGIOGRAPHY;   Surgeon: Wenona Hamilton, MD;  Location: [facility] INVASIVE CV LAB;  Service: Cardiovascular;  Laterality: N/A;   LEFT HEART CATHETERIZATION WITH CORONARY ANGIOGRAM N/A 10/19/2011   Procedure: LEFT HEART CATHETERIZATION WITH CORONARY ANGIOGRAM;  Surgeon: Peter M Swaziland, MD;  Location: [facility] CATH LAB;  Service: Cardiovascular;  Laterality: N/A;   LOWER EXTREMITY ANGIOGRAPHY Left 09/18/2017   Procedure: Lower Extremity Angiography;  Surgeon: Wenona Hamilton, MD;  Location: [facility] INVASIVE CV LAB;  Service: Cardiovascular;  Laterality: Left;   MOUTH SURGERY     "all my teeth removed then cosmetic OR for my dentures to fit"   UMBILICAL HERNIA REPAIR N/A 04/13/2015   Procedure:  UMBILICAL HERNIA REPAIR;  Surgeon: Sim Dryer, MD;  Location: [facility];  Service: General;  Laterality: N/A;    Past Family History   Family History  Problem Relation Age of Onset   Diabetes Mother  Diabetes Father    Stroke Father    Colon cancer Maternal Uncle    Heart disease Brother        Died of MI at 67    Past Social History   Social History   Socioeconomic History   Marital status: Divorced    Spouse name: Not on file   Number of children: Not on file   Years of education: Not on file   Highest education level: Not on file  Occupational History   Not on file  Tobacco Use   Smoking status: Every Day    Current packs/day: 0.00    Average packs/day: 1 pack/day for 47.0 years (47.0 ttl pk-yrs)    Types: Cigarettes    Start date: 09/27/1970    Last attempt to quit: 09/26/2017    Years since quitting: 5.6   Smokeless tobacco: Never  Vaping Use   Vaping status: Never Used  Substance and Sexual Activity   Alcohol use: Yes    Alcohol/week: 3.0 standard drinks of alcohol    Types: 1 Glasses of wine, 2 Cans of beer per week    Comment: occ   Drug use: Not Currently    Comment: "not recently" per pt   Sexual activity: Yes  Other Topics Concern   Not on file  Social History Narrative   Not  on file   Social Drivers of Health   Financial Resource Strain: Not on file  Food Insecurity: Not on file  Transportation Needs: Not on file  Physical Activity: Not on file  Stress: Not on file  Social Connections: Not on file  Intimate Partner Violence: Not on file    Review of Systems   General: Negative for anorexia, fever, chills, fatigue, weakness.?weight loss ENT: Negative for hoarseness, difficulty swallowing , nasal congestion. CV: Negative for chest pain, angina, palpitations, dyspnea on exertion, peripheral edema.  Respiratory: Negative for dyspnea at rest, dyspnea on exertion, cough, sputum, wheezing.  GI: See history of present illness. GU:  Negative for dysuria, hematuria, urinary incontinence, urinary frequency, nocturnal urination.  Endo: Negative for unusual weight change.     Physical Exam   BP (!) 170/97 (BP Location: Right Arm, Patient Position: Sitting, Cuff Size: Large)   Pulse 80   Temp 98.4 F (36.9 C) (Oral)   Ht 6' (1.829 m)   Wt 198 lb 12.8 oz (90.2 kg)   SpO2 98%   BMI 26.96 kg/m    General: Well-nourished, well-developed in no acute distress.  Eyes: No icterus. Mouth: Oropharyngeal mucosa moist and pink   Lungs: Clear to auscultation bilaterally.  Heart: Regular rate and rhythm, no murmurs rubs or gallops.  Abdomen: Bowel sounds are normal, nondistended, no hepatosplenomegaly or masses,  no abdominal bruits or hernia , no rebound or guarding. Mild rectus diastasis. Moderate epigastric tenderness, mild periumbilical tenderness without overt hernia Rectal: not performed  Extremities: No lower extremity edema. No clubbing or deformities. Neuro: Alert and oriented x 4   Skin: Warm and dry, no jaundice.   Psych: Alert and cooperative, normal mood and affect.  Labs   See hpi  Imaging Studies   CT ABDOMEN PELVIS W CONTRAST Result Date: 05/22/2023 CLINICAL DATA:  Abdominal pain. EXAM: CT ABDOMEN AND PELVIS WITH CONTRAST TECHNIQUE:  Multidetector CT imaging of the abdomen and pelvis was performed using the standard protocol following bolus administration of intravenous contrast. RADIATION DOSE REDUCTION: This exam was performed according to the departmental dose-optimization program which includes automated exposure control, adjustment  of the mA and/or kV according to patient size and/or use of iterative reconstruction technique. CONTRAST:  OMNIPAQUE  IOHEXOL  300 MG/ML  SOLN COMPARISON:  CT abdomen pelvis dated 09/21/2022. FINDINGS: Lower chest: The visualized lung bases are clear. No intra-abdominal free air or free fluid. Hepatobiliary: The liver is unremarkable. No biliary dilatation. The gallbladder is unremarkable Pancreas: Unremarkable. No pancreatic ductal dilatation or surrounding inflammatory changes. Spleen: Normal in size without focal abnormality. Adrenals/Urinary Tract: The adrenal glands unremarkable. Small right renal cyst. There is no hydronephrosis on either side. There is symmetric enhancement and excretion of contrast by both kidneys. The visualized ureters and urinary bladder appear unremarkable. Stomach/Bowel: There is distal colonic diverticulosis. There is no bowel obstruction or active inflammation. The appendix is normal. Vascular/Lymphatic: Mild aortoiliac atherosclerotic disease. The IVC is unremarkable. No portal venous gas. There is no adenopathy. Occluded appearance of the visualized proximal superficial femoral arteries bilateral, chronic. Reproductive: The prostate and seminal vesicles are grossly unremarkable. No pelvic mass. Other: Umbilical hernia repair. Musculoskeletal: Degenerative changes of the spine. No acute osseous pathology. IMPRESSION: 1. No acute intra-abdominal or pelvic pathology. 2. Distal colonic diverticulosis. No bowel obstruction. Normal appendix. Electronically Signed   By: Angus Bark [name]   On: 05/22/2023 12:48   US  Abdomen Complete Result Date: 05/15/2023 CLINICAL DATA:   Abdominal pain EXAM: ABDOMEN ULTRASOUND COMPLETE COMPARISON:  09/21/2022, 07/19/2022 FINDINGS: Gallbladder: No gallstones or wall thickening visualized. No sonographic Murphy sign noted by sonographer. Common bile duct: Diameter: 4 mm Liver: Increased liver echotexture compatible with hepatic steatosis. No focal liver abnormality or intrahepatic biliary duct dilation. Portal vein is patent on color Doppler imaging with normal direction of blood flow towards the liver. IVC: No abnormality visualized. Pancreas: Visualized portion unremarkable. Spleen: Size and appearance within normal limits. Right Kidney: Length: 12.3 cm. Echogenicity within normal limits. No hydronephrosis. Simple right renal cyst measuring up to 2.6 cm does not require imaging follow-up. Left Kidney: Length: 12.7 cm. Echogenicity within normal limits. No mass or hydronephrosis visualized. Abdominal aorta: No aneurysm visualized. Other findings: None. IMPRESSION: 1. Increased liver echotexture consistent with hepatic steatosis. 2. Otherwise unremarkable abdominal ultrasound. Electronically Signed   By: Bobbye Burrow [name]   On: 05/15/2023 18:12    Assessment/Plan:   Abdomina pain: epigastric and umbilical, worse with meals, wakes him up from sleep at times. Improved with milk. Prior umb hernia repair with mesh in 2017. Consider gastritis/pud. Consider possible adhesive disease. Consider chronic mesenteric ischemia given history of vascular disease, last CTA negative for CMI in 2017. Biliary etiology less likely.  -start pantoprazole  40mg  daily before breakfast, it was on his med list today but patient states he is not taking -EGD with Dr. Mordechai April. ASA 3.  I have discussed the risks, alternatives, benefits with regards to but not limited to the risk of reaction to medication, bleeding, infection, perforation and the patient is agreeable to proceed. Written consent to be obtained. -if EGD negative, consider CTA vs HIDA vs surgical consultation  for possible pain at site of prior hernia repair  Fatty liver: LFTs normal. Will further evaluate in 3 months. Instructions for fatty liver: Recommend 1-2# weight loss per week until ideal body weight through exercise & diet. Low fat/cholesterol diet.   Avoid sweets, sodas, fruit juices, sweetened beverages like tea, etc. Gradually increase exercise from 15 min daily up to 1 hr per day 5 days/week. Limit alcohol use.   Trudie Fuse. Harles Lied, MHS, PA-C [facility]

## 2023-06-05 NOTE — Progress Notes (Signed)
 GI Office Note    Referring Provider: Omie Bickers, MD Primary Care Physician:  Omie Bickers, MD  Primary Gastroenterologist: Rolando Cliche. Mordechai April, DO   Chief Complaint   Chief Complaint  Patient presents with   Abdominal Pain    Been having abdominal pain for around 2 mths, states that it feels the same as when he had an umbilical hernia.     History of Present Illness   Joshua Hall is a 68 y.o. male presenting today for abdominal pain. Last seen 08/2020.   Labs 04/2023: white Blood cell count 5000, hemoglobin 14.4, platelets 248,000, glucose 103, creatinine 1.25, albumin 4.2, total bilirubin 0.4, alk phos 83, AST 24, ALT 26, A1c 6, PSA 1, vitamin D 8.9  Today: started having abdominal pain again. First started after eating at seafood restaurant. Thought must be food poisoning. No vomiting or diarrhea. Drank milk and eased off. Hurts everyday, wakes up at night. Pain getting worse. Will get bad and then let off after several minutes. Milk seems to help. Does seem to be worse with pain. Patient does not feel he has lost any weight but says he was told he weight 207 pounds two weeks ago at PCP and he is 198 today. According to available weights, he typically is in the upper 190s. BMs regular. No melena, brbpr. No dysphagia. No dysuria.  Rare NSAIDs.  Patient with known vascular disease. CTA A/P in 2017 with no mesenteric artery stenosis.  Wt Readings from Last 3 Encounters:  06/05/23 198 lb 12.8 oz (90.2 kg)  10/14/22 197 lb (89.4 kg)  06/06/21 197 lb (89.4 kg)    Abd u/s 05/2023: IMPRESSION: 1. Increased liver echotexture consistent with hepatic steatosis. 2. Otherwise unremarkable abdominal ultrasound.  CT A/P with contrast 05/2023: IMPRESSION: 1. No acute intra-abdominal or pelvic pathology. 2. Distal colonic diverticulosis. No bowel obstruction. Normal appendix.  Last colonoscopy 2017 with 6 hyperplastic polyps removed. Recall 2027. No family history of colorectal  malignancy.   Egd 03/2015: mild nonerosive gastrtis and peptic duodenitis. No h.pylori  Medications   Current Outpatient Medications  Medication Sig Dispense Refill   albuterol  (PROVENTIL  HFA;VENTOLIN  HFA) 108 (90 BASE) MCG/ACT inhaler Inhale 2 puffs into the lungs every 6 (six) hours as needed for wheezing or shortness of breath. 1 Inhaler 0   amLODipine  (NORVASC ) 10 MG tablet Take 10 mg by mouth daily.     aspirin  81 MG EC tablet Take 1 tablet (81 mg total) by mouth daily. With food 30 tablet 6   EPIPEN  2-PAK 0.3 MG/0.3ML SOAJ injection Inject 0.3 mg as directed as needed for anaphylaxis. Allergic reaction  1   ezetimibe (ZETIA) 10 MG tablet Take 10 mg by mouth daily.     nitroGLYCERIN  (NITROSTAT ) 0.4 MG SL tablet Place 1 tablet (0.4 mg total) under the tongue every 5 (five) minutes as needed for chest pain (3 doses MAX). 25 tablet 3   pantoprazole  (PROTONIX ) 40 MG tablet Take 40 mg by mouth daily.     rosuvastatin  (CRESTOR ) 20 MG tablet Take 1 tablet (20 mg total) by mouth daily. 90 tablet 3   Study - ORION 4 - inclisiran 300 mg/1.5mL or placebo SQ injection (PI-Stuckey) Inject 300 mg into the skin every 6 (six) months.     telmisartan (MICARDIS) 80 MG tablet Take 80 mg by mouth daily.     traMADol (ULTRAM) 50 MG tablet Take 50 mg by mouth every 6 (six) hours as needed.  Vitamin D, Ergocalciferol, (DRISDOL) 1.25 MG (50000 UNIT) CAPS capsule Take 50,000 Units by mouth once a week.     No current facility-administered medications for this visit.    Allergies   Allergies as of 06/05/2023 - Review Complete 06/05/2023  Allergen Reaction Noted   Bee venom Swelling 11/04/2013     Past Medical History   Past Medical History:  Diagnosis Date   Colitis    COPD (chronic obstructive pulmonary disease) (HCC)    Coronary atherosclerosis of native coronary artery    a. cath in 2013 showing nonobstructive disease along the LAD and RCA with 99% stenosis of D2 --> too small for intervention,  medical therapy recommended b. normal NST in 10/2013 c. 04/2017 cath showing 90% distal-LAD stenosis and occluded D2 with medical management recommended   Hyperlipidemia    Hypertension    Myocardial infarction [facility]) ?2013; ?2015   PAD (peripheral artery disease) (HCC)    a. s/p stent placement to the left SFA in 2015   Tobacco use     Past Surgical History   Past Surgical History:  Procedure Laterality Date   ABDOMINAL AORTAGRAM N/A 12/23/2013   Procedure: ABDOMINAL AORTAGRAM;  Surgeon: Wenona Hamilton, MD;  Location: [facility] CATH LAB;  Service: Cardiovascular;  Laterality: N/A;   ABDOMINAL AORTOGRAM N/A 09/18/2017   Procedure: ABDOMINAL AORTOGRAM;  Surgeon: Wenona Hamilton, MD;  Location: [facility] INVASIVE CV LAB;  Service: Cardiovascular;  Laterality: N/A;   ABDOMINAL AORTOGRAM W/LOWER EXTREMITY Left 03/25/2019   Procedure: ABDOMINAL AORTOGRAM W/LOWER EXTREMITY;  Surgeon: Wenona Hamilton, MD;  Location: [facility] INVASIVE CV LAB;  Service: Cardiovascular;  Laterality: Left;   CARDIAC CATHETERIZATION  10/2011   [place]   COLONOSCOPY N/A 03/16/2015   SLF: 1. No source for abdominal pain identified. 2. six colorectal polyps removed. hyperplastic. 3. the colon was redundant 4. mild diverticulosis noted in the sigmoid colon 5. small internal hemorroids.    ESOPHAGOGASTRODUODENOSCOPY N/A 03/16/2015   SLF: 1. No source for abdominal pain identified. 2. mild non-erosive gastritis and duodentitis.    FEMORAL-POPLITEAL BYPASS GRAFT Left 10/08/2017   Procedure: Left  FEMORAL-POPLITEAL ARTERY Bypass Graft;  Surgeon: Adine Hoof, MD;  Location: [facility] OR;  Service: Vascular;  Laterality: Left;   HERNIA REPAIR     INSERTION OF MESH N/A 04/13/2015   Procedure: INSERTION OF MESH;  Surgeon: Sim Dryer, MD;  Location: [facility];  Service: General;  Laterality: N/A;   LEFT HEART CATH AND CORONARY ANGIOGRAPHY N/A 05/01/2017   Procedure: LEFT HEART CATH AND CORONARY ANGIOGRAPHY;   Surgeon: Wenona Hamilton, MD;  Location: [facility] INVASIVE CV LAB;  Service: Cardiovascular;  Laterality: N/A;   LEFT HEART CATHETERIZATION WITH CORONARY ANGIOGRAM N/A 10/19/2011   Procedure: LEFT HEART CATHETERIZATION WITH CORONARY ANGIOGRAM;  Surgeon: Peter M Swaziland, MD;  Location: [facility] CATH LAB;  Service: Cardiovascular;  Laterality: N/A;   LOWER EXTREMITY ANGIOGRAPHY Left 09/18/2017   Procedure: Lower Extremity Angiography;  Surgeon: Wenona Hamilton, MD;  Location: [facility] INVASIVE CV LAB;  Service: Cardiovascular;  Laterality: Left;   MOUTH SURGERY     "all my teeth removed then cosmetic OR for my dentures to fit"   UMBILICAL HERNIA REPAIR N/A 04/13/2015   Procedure:  UMBILICAL HERNIA REPAIR;  Surgeon: Sim Dryer, MD;  Location: [facility];  Service: General;  Laterality: N/A;    Past Family History   Family History  Problem Relation Age of Onset   Diabetes Mother  Diabetes Father    Stroke Father    Colon cancer Maternal Uncle    Heart disease Brother        Died of MI at 67    Past Social History   Social History   Socioeconomic History   Marital status: Divorced    Spouse name: Not on file   Number of children: Not on file   Years of education: Not on file   Highest education level: Not on file  Occupational History   Not on file  Tobacco Use   Smoking status: Every Day    Current packs/day: 0.00    Average packs/day: 1 pack/day for 47.0 years (47.0 ttl pk-yrs)    Types: Cigarettes    Start date: 09/27/1970    Last attempt to quit: 09/26/2017    Years since quitting: 5.6   Smokeless tobacco: Never  Vaping Use   Vaping status: Never Used  Substance and Sexual Activity   Alcohol use: Yes    Alcohol/week: 3.0 standard drinks of alcohol    Types: 1 Glasses of wine, 2 Cans of beer per week    Comment: occ   Drug use: Not Currently    Comment: "not recently" per pt   Sexual activity: Yes  Other Topics Concern   Not on file  Social History Narrative   Not  on file   Social Drivers of Health   Financial Resource Strain: Not on file  Food Insecurity: Not on file  Transportation Needs: Not on file  Physical Activity: Not on file  Stress: Not on file  Social Connections: Not on file  Intimate Partner Violence: Not on file    Review of Systems   General: Negative for anorexia, fever, chills, fatigue, weakness.?weight loss ENT: Negative for hoarseness, difficulty swallowing , nasal congestion. CV: Negative for chest pain, angina, palpitations, dyspnea on exertion, peripheral edema.  Respiratory: Negative for dyspnea at rest, dyspnea on exertion, cough, sputum, wheezing.  GI: See history of present illness. GU:  Negative for dysuria, hematuria, urinary incontinence, urinary frequency, nocturnal urination.  Endo: Negative for unusual weight change.     Physical Exam   BP (!) 170/97 (BP Location: Right Arm, Patient Position: Sitting, Cuff Size: Large)   Pulse 80   Temp 98.4 F (36.9 C) (Oral)   Ht 6' (1.829 m)   Wt 198 lb 12.8 oz (90.2 kg)   SpO2 98%   BMI 26.96 kg/m    General: Well-nourished, well-developed in no acute distress.  Eyes: No icterus. Mouth: Oropharyngeal mucosa moist and pink   Lungs: Clear to auscultation bilaterally.  Heart: Regular rate and rhythm, no murmurs rubs or gallops.  Abdomen: Bowel sounds are normal, nondistended, no hepatosplenomegaly or masses,  no abdominal bruits or hernia , no rebound or guarding. Mild rectus diastasis. Moderate epigastric tenderness, mild periumbilical tenderness without overt hernia Rectal: not performed  Extremities: No lower extremity edema. No clubbing or deformities. Neuro: Alert and oriented x 4   Skin: Warm and dry, no jaundice.   Psych: Alert and cooperative, normal mood and affect.  Labs   See hpi  Imaging Studies   CT ABDOMEN PELVIS W CONTRAST Result Date: 05/22/2023 CLINICAL DATA:  Abdominal pain. EXAM: CT ABDOMEN AND PELVIS WITH CONTRAST TECHNIQUE:  Multidetector CT imaging of the abdomen and pelvis was performed using the standard protocol following bolus administration of intravenous contrast. RADIATION DOSE REDUCTION: This exam was performed according to the departmental dose-optimization program which includes automated exposure control, adjustment  of the mA and/or kV according to patient size and/or use of iterative reconstruction technique. CONTRAST:  OMNIPAQUE  IOHEXOL  300 MG/ML  SOLN COMPARISON:  CT abdomen pelvis dated 09/21/2022. FINDINGS: Lower chest: The visualized lung bases are clear. No intra-abdominal free air or free fluid. Hepatobiliary: The liver is unremarkable. No biliary dilatation. The gallbladder is unremarkable Pancreas: Unremarkable. No pancreatic ductal dilatation or surrounding inflammatory changes. Spleen: Normal in size without focal abnormality. Adrenals/Urinary Tract: The adrenal glands unremarkable. Small right renal cyst. There is no hydronephrosis on either side. There is symmetric enhancement and excretion of contrast by both kidneys. The visualized ureters and urinary bladder appear unremarkable. Stomach/Bowel: There is distal colonic diverticulosis. There is no bowel obstruction or active inflammation. The appendix is normal. Vascular/Lymphatic: Mild aortoiliac atherosclerotic disease. The IVC is unremarkable. No portal venous gas. There is no adenopathy. Occluded appearance of the visualized proximal superficial femoral arteries bilateral, chronic. Reproductive: The prostate and seminal vesicles are grossly unremarkable. No pelvic mass. Other: Umbilical hernia repair. Musculoskeletal: Degenerative changes of the spine. No acute osseous pathology. IMPRESSION: 1. No acute intra-abdominal or pelvic pathology. 2. Distal colonic diverticulosis. No bowel obstruction. Normal appendix. Electronically Signed   By: Angus Bark [name]   On: 05/22/2023 12:48   US  Abdomen Complete Result Date: 05/15/2023 CLINICAL DATA:   Abdominal pain EXAM: ABDOMEN ULTRASOUND COMPLETE COMPARISON:  09/21/2022, 07/19/2022 FINDINGS: Gallbladder: No gallstones or wall thickening visualized. No sonographic Murphy sign noted by sonographer. Common bile duct: Diameter: 4 mm Liver: Increased liver echotexture compatible with hepatic steatosis. No focal liver abnormality or intrahepatic biliary duct dilation. Portal vein is patent on color Doppler imaging with normal direction of blood flow towards the liver. IVC: No abnormality visualized. Pancreas: Visualized portion unremarkable. Spleen: Size and appearance within normal limits. Right Kidney: Length: 12.3 cm. Echogenicity within normal limits. No hydronephrosis. Simple right renal cyst measuring up to 2.6 cm does not require imaging follow-up. Left Kidney: Length: 12.7 cm. Echogenicity within normal limits. No mass or hydronephrosis visualized. Abdominal aorta: No aneurysm visualized. Other findings: None. IMPRESSION: 1. Increased liver echotexture consistent with hepatic steatosis. 2. Otherwise unremarkable abdominal ultrasound. Electronically Signed   By: Bobbye Burrow [name]   On: 05/15/2023 18:12    Assessment/Plan:   Abdomina pain: epigastric and umbilical, worse with meals, wakes him up from sleep at times. Improved with milk. Prior umb hernia repair with mesh in 2017. Consider gastritis/pud. Consider possible adhesive disease. Consider chronic mesenteric ischemia given history of vascular disease, last CTA negative for CMI in 2017. Biliary etiology less likely.  -start pantoprazole  40mg  daily before breakfast, it was on his med list today but patient states he is not taking -EGD with Dr. Mordechai April. ASA 3.  I have discussed the risks, alternatives, benefits with regards to but not limited to the risk of reaction to medication, bleeding, infection, perforation and the patient is agreeable to proceed. Written consent to be obtained. -if EGD negative, consider CTA vs HIDA vs surgical consultation  for possible pain at site of prior hernia repair  Fatty liver: LFTs normal. Will further evaluate in 3 months. Instructions for fatty liver: Recommend 1-2# weight loss per week until ideal body weight through exercise & diet. Low fat/cholesterol diet.   Avoid sweets, sodas, fruit juices, sweetened beverages like tea, etc. Gradually increase exercise from 15 min daily up to 1 hr per day 5 days/week. Limit alcohol use.   Trudie Fuse. Harles Lied, MHS, PA-C [facility]

## 2023-06-05 NOTE — Patient Instructions (Signed)
 Since you are also having increased abdominal pain with eating, you should start pantoprazole  to protect and heal your stomach as you may have gastritis, ulcers. We will schedule you for an upper endoscopy for further evaluation. You have fatty liver noted on imaging. Your current liver labs are normal. I would recommend you try to reduce risk factors, see instructions below. We should follow with you in 3 months to further evaluate your liver.  If you have worsening abdominal pain between now and time of your endoscopy, please let us  know!  Instructions for fatty liver: Recommend 1-2# weight loss per week until ideal body weight through exercise & diet. Low fat/cholesterol diet.   Avoid sweets, sodas, fruit juices, sweetened beverages like tea, etc. Gradually increase exercise from 15 min daily up to 1 hr per day 5 days/week. Limit alcohol use.

## 2023-06-06 ENCOUNTER — Telehealth: Payer: Self-pay | Admitting: *Deleted

## 2023-06-06 ENCOUNTER — Encounter: Payer: Self-pay | Admitting: *Deleted

## 2023-06-06 NOTE — Telephone Encounter (Signed)
 [employer] PA: Approved Authorization #[id number]  Tracking #[id number] Dates of service 07/01/2023 - 09/30/2023

## 2023-06-07 DIAGNOSIS — R109 Unspecified abdominal pain: Secondary | ICD-10-CM | POA: Diagnosis not present

## 2023-06-07 DIAGNOSIS — K219 Gastro-esophageal reflux disease without esophagitis: Secondary | ICD-10-CM | POA: Diagnosis not present

## 2023-06-07 DIAGNOSIS — Z79899 Other long term (current) drug therapy: Secondary | ICD-10-CM | POA: Diagnosis not present

## 2023-06-07 DIAGNOSIS — I10 Essential (primary) hypertension: Secondary | ICD-10-CM | POA: Diagnosis not present

## 2023-06-07 DIAGNOSIS — F17210 Nicotine dependence, cigarettes, uncomplicated: Secondary | ICD-10-CM | POA: Diagnosis not present

## 2023-06-11 ENCOUNTER — Ambulatory Visit: Admitting: Cardiovascular Disease

## 2023-06-18 ENCOUNTER — Ambulatory Visit: Attending: Cardiovascular Disease | Admitting: Cardiovascular Disease

## 2023-06-18 ENCOUNTER — Encounter: Payer: Self-pay | Admitting: Cardiovascular Disease

## 2023-06-18 VITALS — BP 146/100 | HR 67 | Ht 72.0 in | Wt 198.6 lb

## 2023-06-18 DIAGNOSIS — I10 Essential (primary) hypertension: Secondary | ICD-10-CM

## 2023-06-18 DIAGNOSIS — I739 Peripheral vascular disease, unspecified: Secondary | ICD-10-CM | POA: Diagnosis not present

## 2023-06-18 DIAGNOSIS — Z720 Tobacco use: Secondary | ICD-10-CM | POA: Diagnosis not present

## 2023-06-18 DIAGNOSIS — E785 Hyperlipidemia, unspecified: Secondary | ICD-10-CM

## 2023-06-18 DIAGNOSIS — I2510 Atherosclerotic heart disease of native coronary artery without angina pectoris: Secondary | ICD-10-CM

## 2023-06-18 NOTE — Progress Notes (Signed)
 Cardiology Office Note   Date:  06/18/2023   ID:  Joshua Hall, Joshua Hall [birth year], MRN [id number]  PCP:  Omie Bickers, MD  Cardiologist:   Antionette Kirks, MD   No chief complaint on file.     History of Present Illness: Joshua Hall is a 68 y.o. male who presents for a follow-up visit a follow-up visit regarding peripheral arterial disease and coronary artery disease.  He has chronic medical conditions that include previous tobacco use, hypertension and hyperlipidemia.  He had unstable angina in March of 2019. Cardiac catheterization showed 90% distal LAD stenosis close to the apex, occluded second diagonal and no other obstructive disease.  LVEDP was normal and ejection fraction was 55 to 60%.  The distal LAD was too small to stent and I have recommended medical therapy. He is status post femoral to above-the-knee popliteal artery bypass in August, 2019 for severe claudication and recurrent SFA occlusion.  Most recent angiography in 2021 showed no significant aortoiliac disease, long occlusion of the left SFA with occluded femoral-popliteal bypass with extensive collaterals from the profunda and two-vessel runoff below the knee. He missed multiple follow-up appointments and was most recently seen by me in April 2023.  The patient is enrolled in the [place] 4 study.  He has been doing reasonably well and denies chest pain or worsening dyspnea.  He reports bilateral calf claudication that is equal on both sides.  This is most noticeable when he goes hunting.  He rests for a few minutes and is able to resume his activities.  He cut down on tobacco use but has not been able to quit completely.    Past Medical History:  Diagnosis Date   Colitis    COPD (chronic obstructive pulmonary disease) (HCC)    Coronary atherosclerosis of native coronary artery    a. cath in 2013 showing nonobstructive disease along the LAD and RCA with 99% stenosis of D2 --> too small for intervention, medical  therapy recommended b. normal NST in 10/2013 c. 04/2017 cath showing 90% distal-LAD stenosis and occluded D2 with medical management recommended   Hyperlipidemia    Hypertension    Myocardial infarction [facility]) ?2013; ?2015   PAD (peripheral artery disease) (HCC)    a. s/p stent placement to the left SFA in 2015   Tobacco use     Past Surgical History:  Procedure Laterality Date   ABDOMINAL AORTAGRAM N/A 12/23/2013   Procedure: ABDOMINAL Tommi Fraise;  Surgeon: Wenona Hamilton, MD;  Location: [facility] CATH LAB;  Service: Cardiovascular;  Laterality: N/A;   ABDOMINAL AORTOGRAM N/A 09/18/2017   Procedure: ABDOMINAL AORTOGRAM;  Surgeon: Wenona Hamilton, MD;  Location: [facility] INVASIVE CV LAB;  Service: Cardiovascular;  Laterality: N/A;   ABDOMINAL AORTOGRAM W/LOWER EXTREMITY Left 03/25/2019   Procedure: ABDOMINAL AORTOGRAM W/LOWER EXTREMITY;  Surgeon: Wenona Hamilton, MD;  Location: [facility] INVASIVE CV LAB;  Service: Cardiovascular;  Laterality: Left;   CARDIAC CATHETERIZATION  10/2011   [place] [facility] [place]   COLONOSCOPY N/A 03/16/2015   SLF: 1. No source for abdominal pain identified. 2. six colorectal polyps removed. hyperplastic. 3. the colon was redundant 4. mild diverticulosis noted in the sigmoid colon 5. small internal hemorroids.    ESOPHAGOGASTRODUODENOSCOPY N/A 03/16/2015   SLF: 1. No source for abdominal pain identified. 2. mild non-erosive gastritis and duodentitis.    FEMORAL-POPLITEAL BYPASS GRAFT Left 10/08/2017   Procedure: Left  FEMORAL-POPLITEAL ARTERY Bypass Graft;  Surgeon: Adine Hoof, MD;  Location: [facility]  OR;  Service: Vascular;  Laterality: Left;   HERNIA REPAIR     INSERTION OF MESH N/A 04/13/2015   Procedure: INSERTION OF MESH;  Surgeon: Sim Dryer, MD;  Location: [facility];  Service: General;  Laterality: N/A;   LEFT HEART CATH AND CORONARY ANGIOGRAPHY N/A 05/01/2017   Procedure: LEFT HEART CATH AND CORONARY ANGIOGRAPHY;  Surgeon: Wenona Hamilton, MD;   Location: [facility] INVASIVE CV LAB;  Service: Cardiovascular;  Laterality: N/A;   LEFT HEART CATHETERIZATION WITH CORONARY ANGIOGRAM N/A 10/19/2011   Procedure: LEFT HEART CATHETERIZATION WITH CORONARY ANGIOGRAM;  Surgeon: Peter M Swaziland, MD;  Location: [facility] CATH LAB;  Service: Cardiovascular;  Laterality: N/A;   LOWER EXTREMITY ANGIOGRAPHY Left 09/18/2017   Procedure: Lower Extremity Angiography;  Surgeon: Wenona Hamilton, MD;  Location: [facility] INVASIVE CV LAB;  Service: Cardiovascular;  Laterality: Left;   MOUTH SURGERY     "all my teeth removed then cosmetic OR for my dentures to fit"   UMBILICAL HERNIA REPAIR N/A 04/13/2015   Procedure:  UMBILICAL HERNIA REPAIR;  Surgeon: Sim Dryer, MD;  Location: [facility];  Service: General;  Laterality: N/A;     Current Outpatient Medications  Medication Sig Dispense Refill   albuterol  (PROVENTIL  HFA;VENTOLIN  HFA) 108 (90 BASE) MCG/ACT inhaler Inhale 2 puffs into the lungs every 6 (six) hours as needed for wheezing or shortness of breath. 1 Inhaler 0   amLODipine  (NORVASC ) 10 MG tablet Take 10 mg by mouth daily.     aspirin  81 MG EC tablet Take 1 tablet (81 mg total) by mouth daily. With food 30 tablet 6   EPIPEN  2-PAK 0.3 MG/0.3ML SOAJ injection Inject 0.3 mg as directed as needed for anaphylaxis. Allergic reaction  1   ezetimibe (ZETIA) 10 MG tablet Take 10 mg by mouth daily.     nitroGLYCERIN  (NITROSTAT ) 0.4 MG SL tablet Place 1 tablet (0.4 mg total) under the tongue every 5 (five) minutes as needed for chest pain (3 doses MAX). 25 tablet 3   pantoprazole  (PROTONIX ) 40 MG tablet Take 1 tablet (40 mg total) by mouth daily before breakfast. 90 tablet 1   rosuvastatin  (CRESTOR ) 20 MG tablet Take 1 tablet (20 mg total) by mouth daily. 90 tablet 3   Study - [place] 4 - inclisiran 300 mg/1.5mL or placebo SQ injection (PI-Stuckey) Inject 300 mg into the skin every 6 (six) months.     telmisartan (MICARDIS) 80 MG tablet Take 80 mg by mouth daily.      traMADol (ULTRAM) 50 MG tablet Take 50 mg by mouth every 6 (six) hours as needed.     Vitamin D, Ergocalciferol, (DRISDOL) 1.25 MG (50000 UNIT) CAPS capsule Take 50,000 Units by mouth once a week.     No current facility-administered medications for this visit.    Allergies:   Bee venom    Social History:  The patient  reports that he has been smoking cigarettes. He started smoking about 52 years ago. He has a 47 pack-year smoking history. He has never used smokeless tobacco. He reports current alcohol use of about 3.0 standard drinks of alcohol per week. He reports that he does not currently use drugs.   Family History:  The patient's family history includes Colon cancer in his maternal uncle; Diabetes in his father and mother; Heart disease in his brother; Stroke in his father.    ROS:  Please see the history of present illness.   Otherwise, review of systems are positive for  none.   All other systems are reviewed and negative.    PHYSICAL EXAM: VS:  BP (!) 146/100 (BP Location: Left Arm, Patient Position: Sitting)   Pulse 67   Ht 6' (1.829 m)   Wt 198 lb 9.6 oz (90.1 kg)   SpO2 99%   BMI 26.94 kg/m  , BMI Body mass index is 26.94 kg/m. GEN: Well nourished, well developed, in no acute distress  HEENT: normal  Neck: no JVD, carotid bruits, or masses Cardiac: RRR; no murmurs, rubs, or gallops,no edema  Respiratory:  clear to auscultation bilaterally, normal work of breathing GI: soft, nontender, nondistended, + BS MS: no deformity or atrophy  Skin: warm and dry, no rash Neuro:  Strength and sensation are intact Psych: euthymic mood, full affect  EKG:  EKG is ordered today. EKG showed: Normal sinus rhythm Possible Left atrial enlargement Minimal voltage criteria for LVH, may be normal variant ( Sokolow-Lyon ) Nonspecific T wave abnormality    Recent Labs: No results found for requested labs within last 365 days.    Lipid Panel    Component Value Date/Time   CHOL 214  (H) 03/17/2019 1031   TRIG 120 03/17/2019 1031   HDL 72 03/17/2019 1031   CHOLHDL 3.0 03/17/2019 1031   CHOLHDL 3.1 08/07/2017 0836   VLDL 19 08/07/2017 0836   LDLCALC 121 (H) 03/17/2019 1031      Wt Readings from Last 3 Encounters:  06/18/23 198 lb 9.6 oz (90.1 kg)  06/05/23 198 lb 12.8 oz (90.2 kg)  10/14/22 197 lb (89.4 kg)          No data to display            ASSESSMENT AND PLAN:  1.  Coronary artery disease involving native coronary arteries with other forms of angina: Distal LAD stenosis being treated medically.  Currently with no chest pain.   I discussed with him the importance of compliance with medications.  Continue aspirin  81 mg daily.   2.  Peripheral arterial disease: Status post left femoral-popliteal bypass in 2019 which is known to be occluded.   He reports moderate bilateral calf claudication.  I requested a follow-up ABI.  3.    Tobacco use: He cut down but has not been able to quit completely.  I discussed the importance of abstinence.   4.  Essential hypertension: His blood pressure is elevated today but he has not taken his blood pressure medications yet.  He is on amlodipine  and telmisartan.   5.   Hyperlipidemia: Continue treatment with rosuvastatin  and ezetimibe.  Most recent lipid profile showed an LDL of 103.  He is also enrolled in the [place] study.    Disposition:   FU with with me In 12 months  Signed,  Antionette Kirks, MD  06/18/2023 9:52 AM    [facility]

## 2023-06-18 NOTE — Patient Instructions (Addendum)
 Medication Instructions:  Your physician recommends that you continue on your current medications as directed. Please refer to the Current Medication list given to you today.  *If you need a refill on your cardiac medications before your next appointment, please call your pharmacy*  Lab Work: NONE ordered at this time of appointment   Testing/Procedures: Your physician has requested that you have an ankle brachial index (ABI). During this test an ultrasound and blood pressure cuff are used to evaluate the arteries that supply the arms and legs with blood. Allow thirty minutes for this exam. There are no restrictions or special instructions.  Please note: We ask at that you not bring children with you during ultrasound (echo/ vascular) testing. Due to room size and safety concerns, children are not allowed in the ultrasound rooms during exams. Our front office staff cannot provide observation of children in our lobby area while testing is being conducted. An adult accompanying a patient to their appointment will only be allowed in the ultrasound room at the discretion of the ultrasound technician under special circumstances. We apologize for any inconvenience.   Follow-Up: At [facility], you and your health needs are our priority.  As part of our continuing mission to provide you with exceptional heart care, our providers are all part of one team.  This team includes your primary Cardiologist (physician) and Advanced Practice Providers or APPs (Physician Assistants and Nurse Practitioners) who all work together to provide you with the care you need, when you need it.  Your next appointment:   1 year(s)  Provider:   Dr.Arida     We recommend signing up for the patient portal called "MyChart".  Sign up information is provided on this After Visit Summary.  MyChart is used to connect with patients for Virtual Visits (Telemedicine).  Patients are able to view lab/test results, encounter  notes, upcoming appointments, etc.  Non-urgent messages can be sent to your provider as well.   To learn more about what you can do with MyChart, go to [email or web address].

## 2023-06-27 ENCOUNTER — Encounter (HOSPITAL_COMMUNITY): Payer: Self-pay

## 2023-06-27 ENCOUNTER — Encounter (HOSPITAL_COMMUNITY)
Admission: RE | Admit: 2023-06-27 | Discharge: 2023-06-27 | Disposition: A | Discharge status: Home / Self Care | Source: Ambulatory Visit | Attending: Internal Medicine | Admitting: Internal Medicine

## 2023-06-27 ENCOUNTER — Telehealth: Payer: Self-pay

## 2023-06-27 ENCOUNTER — Other Ambulatory Visit: Payer: Self-pay

## 2023-06-27 ENCOUNTER — Other Ambulatory Visit: Payer: Self-pay | Admitting: Gastroenterology

## 2023-06-27 DIAGNOSIS — R1013 Epigastric pain: Secondary | ICD-10-CM

## 2023-06-27 MED ORDER — DICYCLOMINE HCL 10 MG PO CAPS: 10.0000 mg | ORAL_CAPSULE | Freq: Two times a day (BID) | ORAL | 0 refills | Status: AC | PRN

## 2023-06-27 NOTE — Telephone Encounter (Signed)
 Pt called stating that his abdominal pain has worsened and was instructed to contact us  if it had gotten worse before his endoscopy. He is scheduled to have an EGD with Dr. Mordechai April on 07/01/2023. Pt is calling wanting something to be done about the pain. Routing to you in [place] and Dr. Cherisse Cornell absence. Please advise.

## 2023-06-27 NOTE — Telephone Encounter (Signed)
 Pt states that is is a sharp pain like someone is constantly kicking him in the stomach. Pt states that it will ease off for a little bit then return. Pt would like the dicyclomine to be sent in to try.

## 2023-06-27 NOTE — Telephone Encounter (Signed)
 Lmom for pt to return call

## 2023-06-27 NOTE — Telephone Encounter (Signed)
 Pt was made aware and verbalized understanding.

## 2023-07-01 ENCOUNTER — Ambulatory Visit (HOSPITAL_COMMUNITY): Admitting: Anesthesiology

## 2023-07-01 ENCOUNTER — Ambulatory Visit (HOSPITAL_COMMUNITY)
Admission: RE | Admit: 2023-07-01 | Discharge: 2023-07-01 | Disposition: A | Discharge status: Home / Self Care | Attending: Internal Medicine | Admitting: Internal Medicine

## 2023-07-01 ENCOUNTER — Encounter (HOSPITAL_COMMUNITY)
Admission: RE | Disposition: A | Discharge status: Home / Self Care | Payer: Self-pay | Source: Home / Self Care | Attending: Internal Medicine

## 2023-07-01 ENCOUNTER — Other Ambulatory Visit: Payer: Self-pay

## 2023-07-01 ENCOUNTER — Encounter (HOSPITAL_COMMUNITY): Payer: Self-pay | Admitting: Internal Medicine

## 2023-07-01 DIAGNOSIS — I10 Essential (primary) hypertension: Secondary | ICD-10-CM | POA: Diagnosis not present

## 2023-07-01 DIAGNOSIS — K222 Esophageal obstruction: Secondary | ICD-10-CM | POA: Diagnosis not present

## 2023-07-01 DIAGNOSIS — I25119 Atherosclerotic heart disease of native coronary artery with unspecified angina pectoris: Secondary | ICD-10-CM | POA: Diagnosis not present

## 2023-07-01 DIAGNOSIS — K2950 Unspecified chronic gastritis without bleeding: Secondary | ICD-10-CM | POA: Insufficient documentation

## 2023-07-01 DIAGNOSIS — K31A19 Gastric intestinal metaplasia without dysplasia, unspecified site: Secondary | ICD-10-CM | POA: Diagnosis not present

## 2023-07-01 DIAGNOSIS — F17210 Nicotine dependence, cigarettes, uncomplicated: Secondary | ICD-10-CM | POA: Diagnosis not present

## 2023-07-01 DIAGNOSIS — I739 Peripheral vascular disease, unspecified: Secondary | ICD-10-CM | POA: Insufficient documentation

## 2023-07-01 DIAGNOSIS — J449 Chronic obstructive pulmonary disease, unspecified: Secondary | ICD-10-CM | POA: Insufficient documentation

## 2023-07-01 DIAGNOSIS — R1013 Epigastric pain: Secondary | ICD-10-CM | POA: Diagnosis present

## 2023-07-01 DIAGNOSIS — K449 Diaphragmatic hernia without obstruction or gangrene: Secondary | ICD-10-CM | POA: Diagnosis not present

## 2023-07-01 DIAGNOSIS — K2970 Gastritis, unspecified, without bleeding: Secondary | ICD-10-CM | POA: Diagnosis not present

## 2023-07-01 DIAGNOSIS — K3189 Other diseases of stomach and duodenum: Secondary | ICD-10-CM | POA: Insufficient documentation

## 2023-07-01 DIAGNOSIS — I252 Old myocardial infarction: Secondary | ICD-10-CM | POA: Insufficient documentation

## 2023-07-01 HISTORY — PX: ESOPHAGOGASTRODUODENOSCOPY: SHX5428

## 2023-07-01 SURGERY — EGD (ESOPHAGOGASTRODUODENOSCOPY)
Anesthesia: General

## 2023-07-01 MED ORDER — ALBUTEROL SULFATE (2.5 MG/3ML) 0.083% IN NEBU
INHALATION_SOLUTION | RESPIRATORY_TRACT | Status: AC
  Filled 2023-07-01: qty 3

## 2023-07-01 MED ORDER — ALBUTEROL SULFATE (2.5 MG/3ML) 0.083% IN NEBU
2.5000 mg | INHALATION_SOLUTION | Freq: Once | RESPIRATORY_TRACT | Status: AC
  Administered 2023-07-01: 2.5 mg via RESPIRATORY_TRACT

## 2023-07-01 MED ORDER — ALBUTEROL SULFATE (2.5 MG/3ML) 0.083% IN NEBU: 2.5000 mg | INHALATION_SOLUTION | Freq: Four times a day (QID) | RESPIRATORY_TRACT | Status: DC | PRN

## 2023-07-01 MED ORDER — PROPOFOL 10 MG/ML IV BOLUS
INTRAVENOUS | Status: DC | PRN
  Administered 2023-07-01: 100 mg via INTRAVENOUS

## 2023-07-01 MED ORDER — LACTATED RINGERS IV SOLN
INTRAVENOUS | Status: DC
Start: 2023-07-01 — End: 2023-07-01

## 2023-07-01 MED ORDER — PANTOPRAZOLE SODIUM 40 MG PO TBEC: 40.0000 mg | DELAYED_RELEASE_TABLET | Freq: Two times a day (BID) | ORAL | 3 refills | Status: AC

## 2023-07-01 MED ORDER — LIDOCAINE 2% (20 MG/ML) 5 ML SYRINGE
INTRAMUSCULAR | Status: DC | PRN
  Administered 2023-07-01: 100 mg via INTRAVENOUS

## 2023-07-01 NOTE — Interval H&P Note (Signed)
 History and Physical Interval Note:  07/01/2023 8:29 AM  Joshua Hall  has presented today for surgery, with the diagnosis of epigastric pain, Umbilical pain.  The various methods of treatment have been discussed with the patient and family. After consideration of risks, benefits and other options for treatment, the patient has consented to  Procedure(s) with comments: EGD (ESOPHAGOGASTRODUODENOSCOPY) (N/A) - 9:00 am, asa 3 as a surgical intervention.  The patient's history has been reviewed, patient examined, no change in status, stable for surgery.  I have reviewed the patient's chart and labs.  Questions were answered to the patient's satisfaction.     Vinetta Greening

## 2023-07-01 NOTE — Discharge Instructions (Signed)
 EGD Discharge instructions Please read the instructions outlined below and refer to this sheet in the next few weeks. These discharge instructions provide you with general information on caring for yourself after you leave the hospital. Your doctor may also give you specific instructions. While your treatment has been planned according to the most current medical practices available, unavoidable complications occasionally occur. If you have any problems or questions after discharge, please call your doctor. ACTIVITY You may resume your regular activity but move at a slower pace for the next 24 hours.  Take frequent rest periods for the next 24 hours.  Walking will help expel (get rid of) the air and reduce the bloated feeling in your abdomen.  No driving for 24 hours (because of the anesthesia (medicine) used during the test).  You may shower.  Do not sign any important legal documents or operate any machinery for 24 hours (because of the anesthesia used during the test).  NUTRITION Drink plenty of fluids.  You may resume your normal diet.  Begin with a light meal and progress to your normal diet.  Avoid alcoholic beverages for 24 hours or as instructed by your caregiver.  MEDICATIONS You may resume your normal medications unless your caregiver tells you otherwise.  WHAT YOU CAN EXPECT TODAY You may experience abdominal discomfort such as a feeling of fullness or "gas" pains.  FOLLOW-UP Your doctor will discuss the results of your test with you.  SEEK IMMEDIATE MEDICAL ATTENTION IF ANY OF THE FOLLOWING OCCUR: Excessive nausea (feeling sick to your stomach) and/or vomiting.  Severe abdominal pain and distention (swelling).  Trouble swallowing.  Temperature over 101 F (37.8 C).  Rectal bleeding or vomiting of blood.   Your EGD revealed mild amount inflammation in your stomach and small bowel.  I took biopsies of this to rule out infection with a bacteria called H. pylori.  Await pathology  results, my office will contact you.  You have a small hiatal hernia as well.  I am going to increase your pantoprazole  to twice daily for the next 12 weeks.  Avoid NSAIDs.  Follow-up in GI office in 6 to 8 weeks.    I hope you have a great rest of your week!  Rolando Cliche. Mordechai April, D.O. Gastroenterology and Hepatology [facility]

## 2023-07-01 NOTE — Op Note (Signed)
 [facility] Patient Name: Joshua Hall Procedure Date: 07/01/2023 8:15 AM MRN: [id number] Date of Birth: 1955-04-24 Attending MD: Rolando Cliche. Mordechai April , [place], [id number] CSN: [id number] Age: 68 Admit Type: Outpatient Procedure:                Upper GI endoscopy Indications:              Epigastric abdominal pain Providers:                Rolando Cliche. Mordechai April, DO, Troy Furnish. Hazeline Lister RN, RN,                            [place]aly Wilson, Technician, Theola Fitch Referring MD:              Medicines:                See the Anesthesia note for documentation of the                            administered medications Complications:            No immediate complications. Estimated Blood Loss:     Estimated blood loss was minimal. Procedure:                Pre-Anesthesia Assessment:                           - The anesthesia plan was to use monitored                            anesthesia care (MAC).                           After obtaining informed consent, the endoscope was                            passed under direct vision. Throughout the                            procedure, the patient's blood pressure, pulse, and                            oxygen  saturations were monitored continuously. The                            GIF-H190 ([id number]) scope was introduced through the                            mouth, and advanced to the second part of duodenum.                            The upper GI endoscopy was accomplished without                            difficulty. The patient tolerated the procedure  well. Scope In: Scope Out: 8:58:37 AM Findings:      A small hiatal hernia was present.      A mild Schatzki ring was found in the distal esophagus.      Diffuse mild inflammation characterized by erythema was found in the       entire examined stomach. Biopsies were taken with a cold forceps for       Helicobacter pylori testing.      Patchy mildly erythematous mucosa  without active bleeding and with no       stigmata of bleeding was found in the duodenal bulb and in the second       portion of the duodenum. Biopsies were taken with a cold forceps for       histology. Impression:               - Small hiatal hernia.                           - Mild Schatzki ring.                           - Gastritis. Biopsied.                           - Erythematous duodenopathy. Biopsied. Moderate Sedation:      Per Anesthesia Care Recommendation:           - Patient has a contact number available for                            emergencies. The signs and symptoms of potential                            delayed complications were discussed with the                            patient. Return to normal activities tomorrow.                            Written discharge instructions were provided to the                            patient.                           - Resume previous diet.                           - Continue present medications.                           - Await pathology results.                           - Use a proton pump inhibitor PO BID.                           - Return to GI clinic in 8 weeks.                           -  Consider CT angio abd/pelvis if symptoms not                            improved on aggressive PPI therapy                           - Avoid NSAIDs                           - Dilation of esophageal ring not performed today                            as patient without complaints of dysphagia Procedure Code(s):        --- Professional ---                           [phone], Esophagogastroduodenoscopy, flexible,                            transoral; with biopsy, single or multiple Diagnosis Code(s):        --- Professional ---                           K44.9, Diaphragmatic hernia without obstruction or                            gangrene                           K22.2, Esophageal obstruction                           K29.70,  Gastritis, unspecified, without bleeding                           K31.89, Other diseases of stomach and duodenum                           R10.13, Epigastric pain CPT copyright 2022 American Medical Association. All rights reserved. The codes documented in this report are preliminary and upon coder review may  be revised to meet current compliance requirements. Rolando Cliche. Mordechai April, DO Rolando Cliche. [name], DO 07/01/2023 9:06:04 AM This report has been signed electronically. Number of Addenda: 0

## 2023-07-01 NOTE — Transfer of Care (Signed)
 Immediate Anesthesia Transfer of Care Note  Patient: Joshua Hall  Procedure(s) Performed: EGD (ESOPHAGOGASTRODUODENOSCOPY)  Patient Location: Endoscopy Unit  Anesthesia Type:General  Level of Consciousness: awake, alert , oriented, and patient cooperative  Airway & Oxygen  Therapy: Patient Spontanous Breathing  Post-op Assessment: Report given to RN, Post -op Vital signs reviewed and stable, and Patient moving all extremities X 4  Post vital signs: Reviewed and stable  Last Vitals:  Vitals Value Taken Time  BP 113/73 07/01/23 0903  Temp 36.6 C 07/01/23 0903  Pulse 100 07/01/23 0903  Resp 22 07/01/23 0903  SpO2 100 % 07/01/23 0903    Last Pain:  Vitals:   07/01/23 0903  TempSrc: Oral  PainSc: 0-No pain         Complications: No notable events documented.

## 2023-07-01 NOTE — Addendum Note (Signed)
 Addendum  created 07/01/23 1216 by Beacher Limerick, MD   Clinical Note Signed

## 2023-07-01 NOTE — Anesthesia Postprocedure Evaluation (Signed)
 Anesthesia Post Note  Patient: Joshua Hall  Procedure(s) Performed: EGD (ESOPHAGOGASTRODUODENOSCOPY)  Patient location during evaluation: PACU Anesthesia Type: General Level of consciousness: awake and alert Pain management: pain level controlled Vital Signs Assessment: post-procedure vital signs reviewed and stable Respiratory status: spontaneous breathing, nonlabored ventilation, respiratory function stable and patient connected to nasal cannula oxygen  Cardiovascular status: stable and blood pressure returned to baseline Postop Assessment: no apparent nausea or vomiting Anesthetic complications: no  No notable events documented.   Last Vitals:  Vitals:   07/01/23 0801 07/01/23 0903  BP: (!) 162/90 113/73  Pulse: 60 100  Resp: 15 (!) 22  Temp: 36.8 C 36.6 C  SpO2: 100% 100%    Last Pain:  Vitals:   07/01/23 0903  TempSrc: Oral  PainSc: 0-No pain                 Beacher Limerick

## 2023-07-01 NOTE — Anesthesia Preprocedure Evaluation (Addendum)
 Anesthesia Evaluation  Patient identified by MRN, date of birth, ID band Patient awake    Reviewed: Allergy & Precautions, H&P , NPO status , Patient's Chart, lab work & pertinent test results  Airway Mallampati: II  TM Distance: >3 FB Neck ROM: Full    Dental no notable dental hx. (+) Edentulous Upper, Edentulous Lower   Pulmonary COPD, Current Smoker and Patient abstained from smoking.   Pulmonary exam normal breath sounds clear to auscultation       Cardiovascular hypertension, + angina  + CAD, + Past MI and + Peripheral Vascular Disease  Normal cardiovascular exam Rhythm:Regular Rate:Normal  Ef 55-60%   Neuro/Psych negative neurological ROS  negative psych ROS   GI/Hepatic negative GI ROS, Neg liver ROS,,,  Endo/Other  negative endocrine ROS    Renal/GU negative Renal ROS  negative genitourinary   Musculoskeletal negative musculoskeletal ROS (+)    Abdominal   Peds negative pediatric ROS (+)  Hematology negative hematology ROS (+)   Anesthesia Other Findings   Reproductive/Obstetrics negative OB ROS                              Anesthesia Physical Anesthesia Plan  ASA: 3  Anesthesia Plan: General   Post-op Pain Management:    Induction: Intravenous  PONV Risk Score and Plan:   Airway Management Planned:   Additional Equipment:   Intra-op Plan:   Post-operative Plan:   Informed Consent: I have reviewed the patients History and Physical, chart, labs and discussed the procedure including the risks, benefits and alternatives for the proposed anesthesia with the patient or authorized representative who has indicated his/her understanding and acceptance.     Dental advisory given  Plan Discussed with: CRNA  Anesthesia Plan Comments:         Anesthesia Quick Evaluation

## 2023-07-02 ENCOUNTER — Encounter (HOSPITAL_COMMUNITY): Payer: Self-pay | Admitting: Internal Medicine

## 2023-07-02 LAB — SURGICAL PATHOLOGY

## 2023-07-04 ENCOUNTER — Ambulatory Visit: Payer: Self-pay | Admitting: Internal Medicine

## 2023-07-05 ENCOUNTER — Ambulatory Visit (HOSPITAL_COMMUNITY): Admission: RE | Admit: 2023-07-05 | Source: Ambulatory Visit

## 2023-07-09 ENCOUNTER — Encounter (HOSPITAL_COMMUNITY): Payer: Self-pay

## 2023-07-22 DIAGNOSIS — L509 Urticaria, unspecified: Secondary | ICD-10-CM | POA: Diagnosis not present

## 2023-08-20 ENCOUNTER — Ambulatory Visit

## 2023-08-23 ENCOUNTER — Encounter: Payer: Self-pay | Admitting: Cardiovascular Disease

## 2023-09-18 ENCOUNTER — Encounter

## 2023-09-18 MED ORDER — STUDY - ORION 4 - INCLISIRAN 300 MG/1.5 ML OR PLACEBO SQ INJECTION (PI-STUCKEY)
300.0000 mg | INJECTION | SUBCUTANEOUS | Status: AC
  Filled 2023-09-18: qty 1.5

## 2023-11-22 ENCOUNTER — Encounter: Payer: Self-pay | Admitting: *Deleted

## 2023-11-22 DIAGNOSIS — Z006 Encounter for examination for normal comparison and control in clinical research program: Secondary | ICD-10-CM

## 2023-11-22 NOTE — Research (Signed)
 [place] 4  Med record review  Tried to call patient no answer No AE or SAE since last phone visit  Patient was scheduled to come in but no showed  Reviewed meds by [facility]    Will call patient again in 6 month for follow up   Suzen Hardy :) RN BSN  Clinical Research Nurse  Be strong and take heart, all you who hope in the Lord. ~ Psalm 31:24

## 2023-12-13 ENCOUNTER — Telehealth: Payer: Self-pay

## 2023-12-13 ENCOUNTER — Encounter: Payer: Self-pay | Admitting: Internal Medicine

## 2023-12-13 NOTE — Telephone Encounter (Signed)
 Auth Submission: NO AUTH NEEDED Site of care: Site of care: [facility] Payer: medicare a/b Medication & CPT/J Code(s) submitted: Leqvio  (Inclisiran) J1306 Diagnosis Code:  Route of submission (phone, fax, portal): portal Phone # Fax # Auth type: Buy/Bill HB Units/visits requested: 284mg  q84months, x 2 doses Reference number:  Approval from: 12/13/23 to 02/12/24

## 2023-12-13 NOTE — Telephone Encounter (Signed)
 Auth Submission: APPROVED - auto renewal Site of care: [facility] Payer: [employer] medicare Medication & CPT/J Code(s) submitted: Leqvio  (Inclisiran) J1306 Diagnosis Code:  Route of submission (phone, fax, portal): auto renew Phone # Fax # Auth type: Buy/Bill PB Units/visits requested: 284mg  x 2 doses Reference number: [id number] Approval from: 02/13/24 to 02/11/25   Patient is getting treated at [facility], insurance is covering that.  No need to update SOC with insurance.

## 2023-12-19 ENCOUNTER — Encounter: Attending: Internal Medicine | Admitting: *Deleted

## 2023-12-19 VITALS — BP 176/89 | HR 69 | Temp 98.3°F | Resp 18

## 2023-12-19 DIAGNOSIS — E782 Mixed hyperlipidemia: Secondary | ICD-10-CM | POA: Insufficient documentation

## 2023-12-19 MED ORDER — INCLISIRAN SODIUM 284 MG/1.5ML ~~LOC~~ SOSY
284.0000 mg | PREFILLED_SYRINGE | Freq: Once | SUBCUTANEOUS | Status: AC
  Administered 2023-12-19: 284 mg via SUBCUTANEOUS

## 2023-12-19 NOTE — Progress Notes (Signed)
 Diagnosis: Hyperlipidemia  Provider:  Hall, Zack MD  Procedure: Injection  Leqvio  (inclisiran), Dose: 284 mg, Site: subcutaneous, Number of injections: 1  Injection Site(s): Left arm  Post Care: Observation period completed  Discharge: Condition: Good, Destination: Home . AVS Provided  Performed by:  Verneda Golder, RN

## 2023-12-26 ENCOUNTER — Encounter: Payer: Self-pay | Admitting: Internal Medicine

## 2024-01-15 ENCOUNTER — Other Ambulatory Visit (HOSPITAL_COMMUNITY): Payer: Self-pay | Admitting: Internal Medicine

## 2024-01-15 DIAGNOSIS — Z87891 Personal history of nicotine dependence: Secondary | ICD-10-CM

## 2024-01-15 DIAGNOSIS — F17200 Nicotine dependence, unspecified, uncomplicated: Secondary | ICD-10-CM

## 2024-01-27 ENCOUNTER — Encounter (HOSPITAL_COMMUNITY): Payer: Self-pay

## 2024-01-27 ENCOUNTER — Ambulatory Visit (HOSPITAL_COMMUNITY)

## 2024-06-17 ENCOUNTER — Ambulatory Visit
